# Patient Record
Sex: Female | Born: 1950 | Race: White | Hispanic: No | Smoking: Former smoker
Health system: Southern US, Community
[De-identification: ages and names within clinical notes are randomized; demographics above are authoritative.]

## PROBLEM LIST (undated history)

## (undated) ENCOUNTER — Emergency Department (HOSPITAL_COMMUNITY): Discharge status: Absent without leave | Payer: Medicare Other

## (undated) DIAGNOSIS — M1990 Unspecified osteoarthritis, unspecified site: Secondary | ICD-10-CM

## (undated) DIAGNOSIS — I2510 Atherosclerotic heart disease of native coronary artery without angina pectoris: Secondary | ICD-10-CM

## (undated) DIAGNOSIS — J439 Emphysema, unspecified: Secondary | ICD-10-CM

## (undated) DIAGNOSIS — I4819 Other persistent atrial fibrillation: Secondary | ICD-10-CM

## (undated) DIAGNOSIS — J8489 Other specified interstitial pulmonary diseases: Secondary | ICD-10-CM

## (undated) DIAGNOSIS — E7800 Pure hypercholesterolemia, unspecified: Secondary | ICD-10-CM

## (undated) DIAGNOSIS — I5032 Chronic diastolic (congestive) heart failure: Secondary | ICD-10-CM

## (undated) DIAGNOSIS — J9610 Chronic respiratory failure, unspecified whether with hypoxia or hypercapnia: Secondary | ICD-10-CM

## (undated) DIAGNOSIS — I77810 Thoracic aortic ectasia: Secondary | ICD-10-CM

## (undated) DIAGNOSIS — I10 Essential (primary) hypertension: Secondary | ICD-10-CM

## (undated) HISTORY — DX: Other specified interstitial pulmonary diseases: J84.89

## (undated) HISTORY — DX: Chronic respiratory failure, unspecified whether with hypoxia or hypercapnia: J96.10

## (undated) HISTORY — PX: LEG SURGERY: SHX1003

## (undated) HISTORY — DX: Atherosclerotic heart disease of native coronary artery without angina pectoris: I25.10

## (undated) HISTORY — PX: CATARACT EXTRACTION: SUR2

## (undated) HISTORY — DX: Emphysema, unspecified: J43.9

## (undated) HISTORY — DX: Thoracic aortic ectasia: I77.810

## (undated) HISTORY — DX: Other persistent atrial fibrillation: I48.19

## (undated) HISTORY — DX: Chronic diastolic (congestive) heart failure: I50.32

---

## 2008-01-27 ENCOUNTER — Encounter: Payer: Self-pay | Admitting: Endocrinology

## 2008-02-10 ENCOUNTER — Encounter: Payer: Self-pay | Admitting: Endocrinology

## 2008-02-24 DIAGNOSIS — M899 Disorder of bone, unspecified: Secondary | ICD-10-CM | POA: Insufficient documentation

## 2008-02-24 DIAGNOSIS — M549 Dorsalgia, unspecified: Secondary | ICD-10-CM | POA: Insufficient documentation

## 2008-02-24 DIAGNOSIS — M949 Disorder of cartilage, unspecified: Secondary | ICD-10-CM

## 2008-02-24 DIAGNOSIS — I10 Essential (primary) hypertension: Secondary | ICD-10-CM | POA: Insufficient documentation

## 2008-02-24 DIAGNOSIS — M4120 Other idiopathic scoliosis, site unspecified: Secondary | ICD-10-CM | POA: Insufficient documentation

## 2008-02-25 ENCOUNTER — Ambulatory Visit: Payer: Self-pay | Admitting: Endocrinology

## 2008-02-25 DIAGNOSIS — F329 Major depressive disorder, single episode, unspecified: Secondary | ICD-10-CM | POA: Insufficient documentation

## 2008-02-25 DIAGNOSIS — E0590 Thyrotoxicosis, unspecified without thyrotoxic crisis or storm: Secondary | ICD-10-CM | POA: Insufficient documentation

## 2008-02-25 LAB — CONVERTED CEMR LAB: TSH: 0.25 microintl units/mL — ABNORMAL LOW (ref 0.35–5.50)

## 2008-03-07 ENCOUNTER — Encounter: Admission: RE | Admit: 2008-03-07 | Discharge: 2008-03-07 | Payer: Self-pay | Admitting: Endocrinology

## 2010-11-23 ENCOUNTER — Emergency Department (HOSPITAL_COMMUNITY)
Admission: EM | Admit: 2010-11-23 | Discharge: 2010-11-24 | Disposition: A | Discharge status: Home / Self Care | Payer: Medicare Other | Attending: Emergency Medicine | Admitting: Emergency Medicine

## 2010-11-23 DIAGNOSIS — N289 Disorder of kidney and ureter, unspecified: Secondary | ICD-10-CM | POA: Insufficient documentation

## 2010-11-23 DIAGNOSIS — I10 Essential (primary) hypertension: Secondary | ICD-10-CM | POA: Insufficient documentation

## 2010-11-23 DIAGNOSIS — M7989 Other specified soft tissue disorders: Secondary | ICD-10-CM | POA: Insufficient documentation

## 2010-11-23 DIAGNOSIS — R609 Edema, unspecified: Secondary | ICD-10-CM | POA: Insufficient documentation

## 2010-11-23 DIAGNOSIS — R0609 Other forms of dyspnea: Secondary | ICD-10-CM | POA: Insufficient documentation

## 2010-11-23 DIAGNOSIS — R0989 Other specified symptoms and signs involving the circulatory and respiratory systems: Secondary | ICD-10-CM | POA: Insufficient documentation

## 2010-11-23 DIAGNOSIS — R079 Chest pain, unspecified: Secondary | ICD-10-CM | POA: Insufficient documentation

## 2010-11-24 ENCOUNTER — Emergency Department (HOSPITAL_COMMUNITY): Payer: Medicare Other

## 2010-11-24 LAB — BASIC METABOLIC PANEL
BUN: 29 mg/dL — ABNORMAL HIGH (ref 6–23)
CO2: 29 mEq/L (ref 19–32)
Chloride: 106 mEq/L (ref 96–112)
Creatinine, Ser: 1.19 mg/dL (ref 0.4–1.2)
Potassium: 4.3 mEq/L (ref 3.5–5.1)

## 2010-11-24 LAB — CBC
HCT: 35.6 % — ABNORMAL LOW (ref 36.0–46.0)
Hemoglobin: 11.2 g/dL — ABNORMAL LOW (ref 12.0–15.0)
MCH: 27.2 pg (ref 26.0–34.0)
MCV: 86.4 fL (ref 78.0–100.0)
RBC: 4.12 MIL/uL (ref 3.87–5.11)
WBC: 10.2 10*3/uL (ref 4.0–10.5)

## 2010-11-24 LAB — DIFFERENTIAL
Eosinophils Absolute: 0.5 10*3/uL (ref 0.0–0.7)
Lymphocytes Relative: 23 % (ref 12–46)
Lymphs Abs: 2.3 10*3/uL (ref 0.7–4.0)
Monocytes Relative: 12 % (ref 3–12)
Neutro Abs: 6.1 10*3/uL (ref 1.7–7.7)
Neutrophils Relative %: 60 % (ref 43–77)

## 2013-11-08 ENCOUNTER — Other Ambulatory Visit (HOSPITAL_COMMUNITY): Payer: Self-pay | Admitting: Internal Medicine

## 2013-11-08 DIAGNOSIS — E0590 Thyrotoxicosis, unspecified without thyrotoxic crisis or storm: Secondary | ICD-10-CM

## 2013-11-16 ENCOUNTER — Encounter (HOSPITAL_COMMUNITY)
Admission: RE | Admit: 2013-11-16 | Discharge: 2013-11-16 | Disposition: A | Discharge status: Home / Self Care | Payer: Medicare Other | Source: Ambulatory Visit | Attending: Internal Medicine | Admitting: Internal Medicine

## 2013-11-16 DIAGNOSIS — E0590 Thyrotoxicosis, unspecified without thyrotoxic crisis or storm: Secondary | ICD-10-CM

## 2013-11-16 DIAGNOSIS — E0520 Thyrotoxicosis with toxic multinodular goiter without thyrotoxic crisis or storm: Secondary | ICD-10-CM | POA: Insufficient documentation

## 2013-11-17 ENCOUNTER — Encounter (HOSPITAL_COMMUNITY)
Admission: RE | Admit: 2013-11-17 | Discharge: 2013-11-17 | Disposition: A | Discharge status: Home / Self Care | Payer: Medicare Other | Source: Ambulatory Visit | Attending: Internal Medicine | Admitting: Internal Medicine

## 2013-11-17 MED ORDER — SODIUM PERTECHNETATE TC 99M INJECTION
10.0000 | Freq: Once | INTRAVENOUS | Status: AC | PRN
  Administered 2013-11-17: 10 via INTRAVENOUS

## 2013-11-17 MED ORDER — SODIUM IODIDE I 131 CAPSULE
9.0000 | Freq: Once | INTRAVENOUS | Status: AC | PRN
  Administered 2013-11-16: 9 via ORAL

## 2013-11-19 ENCOUNTER — Encounter (HOSPITAL_COMMUNITY): Payer: Self-pay

## 2013-11-19 ENCOUNTER — Encounter (HOSPITAL_COMMUNITY)
Admission: RE | Admit: 2013-11-19 | Discharge: 2013-11-19 | Disposition: A | Discharge status: Home / Self Care | Payer: Medicare Other | Source: Ambulatory Visit | Attending: Internal Medicine | Admitting: Internal Medicine

## 2013-11-19 DIAGNOSIS — E0590 Thyrotoxicosis, unspecified without thyrotoxic crisis or storm: Secondary | ICD-10-CM

## 2013-11-19 MED ORDER — SODIUM IODIDE I 131 CAPSULE
32.4000 | Freq: Once | INTRAVENOUS | Status: AC | PRN
  Administered 2013-11-19: 32.4 via ORAL

## 2016-02-05 ENCOUNTER — Emergency Department (HOSPITAL_COMMUNITY): Payer: Medicare Other

## 2016-02-05 ENCOUNTER — Encounter (HOSPITAL_COMMUNITY): Payer: Self-pay | Admitting: *Deleted

## 2016-02-05 ENCOUNTER — Observation Stay (HOSPITAL_COMMUNITY)
Admission: EM | Admit: 2016-02-05 | Discharge: 2016-02-08 | Disposition: A | Discharge status: Home / Self Care | Payer: Medicare Other | Attending: Internal Medicine | Admitting: Internal Medicine

## 2016-02-05 DIAGNOSIS — I10 Essential (primary) hypertension: Secondary | ICD-10-CM | POA: Diagnosis present

## 2016-02-05 DIAGNOSIS — Z87891 Personal history of nicotine dependence: Secondary | ICD-10-CM | POA: Diagnosis not present

## 2016-02-05 DIAGNOSIS — R0600 Dyspnea, unspecified: Secondary | ICD-10-CM | POA: Diagnosis present

## 2016-02-05 DIAGNOSIS — Z79891 Long term (current) use of opiate analgesic: Secondary | ICD-10-CM | POA: Insufficient documentation

## 2016-02-05 DIAGNOSIS — J441 Chronic obstructive pulmonary disease with (acute) exacerbation: Secondary | ICD-10-CM | POA: Insufficient documentation

## 2016-02-05 DIAGNOSIS — R739 Hyperglycemia, unspecified: Secondary | ICD-10-CM | POA: Diagnosis not present

## 2016-02-05 DIAGNOSIS — I159 Secondary hypertension, unspecified: Secondary | ICD-10-CM

## 2016-02-05 DIAGNOSIS — I5033 Acute on chronic diastolic (congestive) heart failure: Secondary | ICD-10-CM | POA: Insufficient documentation

## 2016-02-05 DIAGNOSIS — J9601 Acute respiratory failure with hypoxia: Secondary | ICD-10-CM | POA: Diagnosis not present

## 2016-02-05 DIAGNOSIS — M1990 Unspecified osteoarthritis, unspecified site: Secondary | ICD-10-CM | POA: Diagnosis not present

## 2016-02-05 DIAGNOSIS — Z79899 Other long term (current) drug therapy: Secondary | ICD-10-CM | POA: Diagnosis not present

## 2016-02-05 DIAGNOSIS — I110 Hypertensive heart disease with heart failure: Secondary | ICD-10-CM | POA: Diagnosis not present

## 2016-02-05 DIAGNOSIS — H409 Unspecified glaucoma: Secondary | ICD-10-CM | POA: Diagnosis not present

## 2016-02-05 DIAGNOSIS — R609 Edema, unspecified: Secondary | ICD-10-CM

## 2016-02-05 DIAGNOSIS — J189 Pneumonia, unspecified organism: Principal | ICD-10-CM | POA: Diagnosis present

## 2016-02-05 HISTORY — DX: Unspecified osteoarthritis, unspecified site: M19.90

## 2016-02-05 HISTORY — DX: Essential (primary) hypertension: I10

## 2016-02-05 HISTORY — DX: Pure hypercholesterolemia, unspecified: E78.00

## 2016-02-05 LAB — CBC WITH DIFFERENTIAL/PLATELET
Basophils Absolute: 0.1 10*3/uL (ref 0.0–0.1)
Basophils Relative: 1 %
Eosinophils Absolute: 0.3 10*3/uL (ref 0.0–0.7)
Eosinophils Relative: 3 %
HCT: 42.9 % (ref 36.0–46.0)
Hemoglobin: 14.3 g/dL (ref 12.0–15.0)
Lymphocytes Relative: 17 %
Lymphs Abs: 1.7 10*3/uL (ref 0.7–4.0)
MCH: 28.3 pg (ref 26.0–34.0)
MCHC: 33.3 g/dL (ref 30.0–36.0)
MCV: 85 fL (ref 78.0–100.0)
Monocytes Absolute: 1.4 10*3/uL — ABNORMAL HIGH (ref 0.1–1.0)
Monocytes Relative: 14 %
Neutro Abs: 6.6 10*3/uL (ref 1.7–7.7)
Neutrophils Relative %: 66 %
Platelets: 147 10*3/uL — ABNORMAL LOW (ref 150–400)
RBC: 5.05 MIL/uL (ref 3.87–5.11)
RDW: 17.4 % — ABNORMAL HIGH (ref 11.5–15.5)
WBC: 10.1 10*3/uL (ref 4.0–10.5)

## 2016-02-05 LAB — BASIC METABOLIC PANEL
Anion gap: 7 (ref 5–15)
BUN: 17 mg/dL (ref 6–20)
CO2: 25 mmol/L (ref 22–32)
Calcium: 9 mg/dL (ref 8.9–10.3)
Chloride: 105 mmol/L (ref 101–111)
Creatinine, Ser: 0.85 mg/dL (ref 0.44–1.00)
GFR calc Af Amer: >60 mL/min (ref >60)
GFR calc non Af Amer: >60 mL/min (ref >60)
Glucose, Bld: 93 mg/dL (ref 65–99)
Potassium: 3.9 mmol/L (ref 3.5–5.1)
Sodium: 137 mmol/L (ref 135–145)

## 2016-02-05 LAB — I-STAT CG4 LACTIC ACID, ED: Lactic Acid, Venous: 1.05 mmol/L (ref 0.5–2.0)

## 2016-02-05 MED ORDER — IPRATROPIUM-ALBUTEROL 0.5-2.5 (3) MG/3ML IN SOLN
3.0000 mL | Freq: Once | RESPIRATORY_TRACT | Status: AC
  Administered 2016-02-05: 3 mL via RESPIRATORY_TRACT
  Filled 2016-02-05: qty 3

## 2016-02-05 MED ORDER — METHYLPREDNISOLONE SODIUM SUCC 125 MG IJ SOLR
125.0000 mg | Freq: Once | INTRAMUSCULAR | Status: AC
  Administered 2016-02-05: 125 mg via INTRAVENOUS
  Filled 2016-02-05: qty 2

## 2016-02-05 MED ORDER — ALBUTEROL (5 MG/ML) CONTINUOUS INHALATION SOLN
10.0000 mg/h | INHALATION_SOLUTION | Freq: Once | RESPIRATORY_TRACT | Status: AC
  Administered 2016-02-05: 10 mg/h via RESPIRATORY_TRACT
  Filled 2016-02-05 (×2): qty 20

## 2016-02-05 MED ORDER — DEXTROSE 5 % IV SOLN
1.0000 g | Freq: Once | INTRAVENOUS | Status: AC
  Administered 2016-02-05: 1 g via INTRAVENOUS
  Filled 2016-02-05: qty 10

## 2016-02-05 MED ORDER — CEFTRIAXONE SODIUM 1 G IJ SOLR
1.0000 g | INTRAMUSCULAR | Status: DC
  Administered 2016-02-06 – 2016-02-07 (×2): 1 g via INTRAVENOUS
  Filled 2016-02-05 (×2): qty 10

## 2016-02-05 MED ORDER — AZITHROMYCIN 500 MG IV SOLR: 500.0000 mg | INTRAVENOUS | Status: DC

## 2016-02-05 MED ORDER — ALBUTEROL SULFATE (2.5 MG/3ML) 0.083% IN NEBU
5.0000 mg | INHALATION_SOLUTION | Freq: Once | RESPIRATORY_TRACT | Status: AC
  Administered 2016-02-05: 5 mg via RESPIRATORY_TRACT
  Filled 2016-02-05: qty 6

## 2016-02-05 MED ORDER — AZITHROMYCIN 500 MG IV SOLR
500.0000 mg | Freq: Once | INTRAVENOUS | Status: AC
  Administered 2016-02-05: 500 mg via INTRAVENOUS
  Filled 2016-02-05: qty 500

## 2016-02-05 NOTE — Progress Notes (Signed)
Pharmacy Antibiotic Note  [name] is a 65 y.o. female admitted on 02/05/2016 with pneumonia.  Pharmacy has been consulted for  Azithromycin and Ceftriaxone dosing.  Plan: Ceftriaxone 1g IV q24h Azithromycin 500mg  IV q24h  Height: 5' (152.4 cm) Weight: 207 lb (93.895 kg) IBW/kg (Calculated) : 45.5  Temp (24hrs), Avg:98.6 F (37 C), Min:98.6 F (37 C), Max:98.6 F (37 C)   Recent Labs Lab 02/05/16 1758  WBC 10.1  CREATININE 0.85    Estimated Creatinine Clearance: 67.6 mL/min (by C-G formula based on Cr of 0.85).    Allergies  Allergen Reactions  . Percocet [Oxycodone-Acetaminophen] Itching    Antimicrobials this admission: 6/12 Azithromycin >>  6/12 Ceftriaxone >>   Dose adjustments this admission:  Microbiology results: 6/12 BCx: collected  Dosage remains stable and need for further dosage adjustment appears unlikely at present.    Will sign off at this time.  Please reconsult if a change in clinical status warrants re-evaluation of dosage.  Thank you for allowing pharmacy to be a part of this patient's care.  Haynes Hoehn, PharmD, BCPS 02/05/2016, 9:12 PM  Pager: [phone]

## 2016-02-05 NOTE — ED Notes (Signed)
Pt reports going to her PCP today for routine office visit.  Was found to have hypoxia.  Pt reports bila low back pain, SOB and cough.  Reports she started to feel bad x 2 days.  Coughing up greenish sputum.  Denies any lung problems at this time.

## 2016-02-05 NOTE — ED Notes (Signed)
EKG ordered at 2052 has been discontinued.  Clicked completed by error.

## 2016-02-05 NOTE — ED Notes (Signed)
Coming from [facility] Physician for COPD exacerbation. Hypoxic at 80-85% coming POV

## 2016-02-05 NOTE — H&P (Signed)
- History and Physical    [name] ZOX:[id number] DOB: 07/27/1951 DOA: 02/05/2016  PCP: Romero Belling, MD  Patient coming from: home  Chief Complaint: "I just wasn't getting better."  HPI: [name] is a 65 y.o. female with medical history significant for hypertension, cholesterol high and glaucoma presents to the emergency room with chief complaint of respiratory distress. Patient states that she has been short of breath for the last month. She went to an urgent care with this complaint and was given antibiotics. She states the antibiotics did not help her in the long-term. She was briefly better and then got worse again. Patient went to go get seen today for further evaluation and was found to be hypoxic into the 80s. She was sent over to the emergency room.  ED Course: Patient started on Rocephin and azithromycin in the emergency room.  Review of Systems: As per HPI otherwise 10 point review of systems negative.    Past Medical History  Diagnosis Date  . Osteoarthritis   . Hypertension   . Hypercholesterolemia     Past Surgical History  Procedure Laterality Date  . Leg surgery Right      reports that she has quit smoking. She does not have any smokeless tobacco history on file. She reports that she does not drink alcohol or use illicit drugs.  Allergies  Allergen Reactions  . Percocet [Oxycodone-Acetaminophen] Itching    Family History  Problem Relation Age of Onset  . Stroke Neg Hx      Prior to Admission medications   Medication Sig Start Date End Date Taking? Authorizing Provider  amLODipine (NORVASC) 10 MG tablet TK 1 T PO QD 12/12/15  Yes Historical Provider, MD  atenolol (TENORMIN) 25 MG tablet TK 1 T PO QD 01/05/16  Yes Historical Provider, MD  benazepril (LOTENSIN) 20 MG tablet TK 1 T PO QD 12/12/15  Yes Historical Provider, MD  HYDROcodone-acetaminophen (NORCO/VICODIN) 5-325 MG tablet TK 1 T PO BID PRN FOR PAIN 11/30/15  Yes Historical Provider, MD   latanoprost (XALATAN) 0.005 % ophthalmic solution Place 1 drop into both eyes at bedtime.   Yes Historical Provider, MD  LORazepam (ATIVAN) 1 MG tablet TAKE 1 TABLET BY MOUTH TID PRN ANXIETY 01/05/16  Yes Historical Provider, MD  timolol (TIMOPTIC) 0.5 % ophthalmic solution INSTILL 1 DROP IN BOTH EYES BID 11/28/15  Yes Historical Provider, MD  PROAIR HFA 108 (90 Base) MCG/ACT inhaler INHALE 2 PUFFS INTO THE LUNGS Q 4 HOURS PRN 01/05/16   Historical Provider, MD    Physical Exam:   Constitutional: NAD, calm, comfortable Filed Vitals:   02/05/16 1723 02/05/16 2030  BP: 137/92 115/52  Pulse: 90 95  Temp: 98.6 F (37 C)   TempSrc: Oral   Resp: 22 27  Height: 5' (1.524 m)   Weight: 93.895 kg (207 lb)   SpO2: 85% 90%   Eyes: PERRL, lids and conjunctivae normal ENMT: Mucous membranes are moist. Posterior pharynx clear of any exudate or lesions.Normal dentition.  Neck: normal, supple, no masses, no thyromegaly Respiratory: Right and left-sided crackles. Mild increased work of breathing.  Cardiovascular: Regular rate and rhythm, no murmurs / rubs / gallops. No extremity edema. 2+ pedal pulses. No carotid bruits.  Abdomen: no tenderness, no masses palpated. No hepatosplenomegaly. Bowel sounds positive.  Musculoskeletal: no clubbing / cyanosis. No joint deformity upper and lower extremities. Good ROM, no contractures. Normal muscle tone.  Skin: no rashes, lesions, ulcers. No induration Neurologic: CN 2-12 grossly  intact. Sensation intact, DTR normal. Strength 5/5 in all 4.  Psychiatric: Normal judgment and insight. Alert and oriented x 3. Normal mood.   Labs on Admission: I have personally reviewed following labs and imaging studies  CBC:  Recent Labs Lab 02/05/16 1758  WBC 10.1  NEUTROABS 6.6  HGB 14.3  HCT 42.9  MCV 85.0  PLT 147*   Basic Metabolic Panel:  Recent Labs Lab 02/05/16 1758  NA 137  K 3.9  CL 105  CO2 25  GLUCOSE 93  BUN 17  CREATININE 0.85  CALCIUM 9.0    GFR: Estimated Creatinine Clearance: 67.6 mL/min (by C-G formula based on Cr of 0.85). Liver Function Tests: No results for input(s): AST, ALT, ALKPHOS, BILITOT, PROT, ALBUMIN in the last 168 hours. No results for input(s): LIPASE, AMYLASE in the last 168 hours. No results for input(s): AMMONIA in the last 168 hours. Coagulation Profile: No results for input(s): INR, PROTIME in the last 168 hours. Cardiac Enzymes: No results for input(s): CKTOTAL, CKMB, CKMBINDEX, TROPONINI in the last 168 hours. BNP (last 3 results) No results for input(s): PROBNP in the last 8760 hours. HbA1C: No results for input(s): HGBA1C in the last 72 hours. CBG: No results for input(s): GLUCAP in the last 168 hours. Lipid Profile: No results for input(s): CHOL, HDL, LDLCALC, TRIG, CHOLHDL, LDLDIRECT in the last 72 hours. Thyroid Function Tests: No results for input(s): TSH, T4TOTAL, FREET4, T3FREE, THYROIDAB in the last 72 hours. Anemia Panel: No results for input(s): VITAMINB12, FOLATE, FERRITIN, TIBC, IRON, RETICCTPCT in the last 72 hours. Urine analysis: No results found for: COLORURINE, APPEARANCEUR, LABSPEC, PHURINE, GLUCOSEU, HGBUR, BILIRUBINUR, KETONESUR, PROTEINUR, UROBILINOGEN, NITRITE, LEUKOCYTESUR Sepsis Labs: !!!!!!!!!!!!!!!!!!!!!!!!!!!!!!!!!!!!!!!!!!!! @LABRCNTIP (procalcitonin:4,lacticidven:4) )No results found for this or any previous visit (from the past 240 hour(s)).   Radiological Exams on Admission: Dg Chest 2 View  02/05/2016  CLINICAL DATA:  Chronic productive cough and chest soreness. Initial encounter. EXAM: CHEST  2 VIEW COMPARISON:  Chest radiograph performed 11/24/2010 FINDINGS: The lungs are well-aerated. Vascular congestion is noted. Patchy right midlung and left basilar airspace opacities raise concern for pneumonia. No pleural effusion or pneumothorax is seen. The heart is normal in size; the mediastinal contour is within normal limits. No acute osseous abnormalities are seen.  IMPRESSION: Vascular congestion noted. Patchy right midlung and left basilar airspace opacities raise concern for pneumonia. Followup PA and lateral chest X-ray is recommended in 3-4 weeks following trial of antibiotic therapy to ensure resolution and exclude underlying malignancy. Electronically Signed   By: Roanna Raider M.D.   On: 02/05/2016 18:45    EKG: Independently reviewed. Normal sinus rhythm, no stemi  Assessment/Plan Principal Problem:   Acute respiratory failure with hypoxia (HCC) Active Problems:   HTN (hypertension)  Acute respiratory failure with hypoxia due to pneumonia Started Rocephin will continue  Continue azithromycin DuoNeb's schedule When necessary albuterol every 2 Day 2 of antibiotics Blood cultures 2 pending Incentive spirometry Continuous pulse ox  Hypertension Continue Lotensin 20 mg daily Continue Norvasc 10 mg daily Continue Tenormin 25 mg daily When necessary hydralazine for high blood pressure  Glaucoma  Xalatan 1 drop both eyes daily at bedtime Timoptic one drop both eyes twice a day  DVT prophylaxis: Lovenox Code Status: Full code Family Communication: None Disposition Plan: Home Consults called: None Admission status: obs   Haydee Salter MD [facility] Pager [phone]  If 7PM-7AM, please contact night-coverage [email or web address] Password [facility]  02/05/2016, 10:39 PM

## 2016-02-05 NOTE — ED Provider Notes (Signed)
CSN: [id number]     Arrival date & time 02/05/16  1718 History   First MD Initiated Contact with Patient 02/05/16 1723     Chief Complaint  Patient presents with  . Respiratory Distress  . Cough   HPI   65 -year-old female presents today with respiratory distress. Patient was seen at her primary care provider's office today after 1 month long history of upper respiratory infection. She reports that symptoms have progressively worsened to the point today where she was having difficulty breathing. She notes she was giving a breathing treatment at her primary care provider's office and sent for further evaluation. Patient was hypoxic at their office. Patient notes that symptoms started with upper respiratory congestion, dry nonproductive cough. Symptoms continue to persist with worsening cough and shortness of breath. Patient denies any history of asthma COPD, she does report that she is a previous smoker, quitting 5 years ago. Patient reports low-grade fever at home.    Past Medical History  Diagnosis Date  . Osteoarthritis   . Hypertension   . Hypercholesterolemia    Past Surgical History  Procedure Laterality Date  . Leg surgery Right   . Cataract extraction Bilateral    Family History  Problem Relation Age of Onset  . Stroke Neg Hx    Social History  Substance Use Topics  . Smoking status: Former Games developer  . Smokeless tobacco: None  . Alcohol Use: No   OB History    No data available     Review of Systems  All other systems reviewed and are negative.   Allergies  Percocet  Home Medications   Prior to Admission medications   Medication Sig Start Date End Date Taking? Authorizing Provider  amLODipine (NORVASC) 10 MG tablet TK 1 T PO QD 12/12/15  Yes Historical Provider, MD  atenolol (TENORMIN) 25 MG tablet TK 1 T PO QD 01/05/16  Yes Historical Provider, MD  benazepril (LOTENSIN) 20 MG tablet TK 1 T PO QD 12/12/15  Yes Historical Provider, MD  HYDROcodone-acetaminophen  (NORCO/VICODIN) 5-325 MG tablet TK 1 T PO BID PRN FOR PAIN 11/30/15  Yes Historical Provider, MD  latanoprost (XALATAN) 0.005 % ophthalmic solution Place 1 drop into both eyes at bedtime.   Yes Historical Provider, MD  LORazepam (ATIVAN) 1 MG tablet TAKE 1 TABLET BY MOUTH TID PRN ANXIETY 01/05/16  Yes Historical Provider, MD  timolol (TIMOPTIC) 0.5 % ophthalmic solution INSTILL 1 DROP IN BOTH EYES BID 11/28/15  Yes Historical Provider, MD  PROAIR HFA 108 (90 Base) MCG/ACT inhaler INHALE 2 PUFFS INTO THE LUNGS Q 4 HOURS PRN 01/05/16   Historical Provider, MD   BP 130/81 mmHg  Pulse 90  Temp(Src) 97.7 F (36.5 C) (Oral)  Resp 22  Ht 5' (1.524 m)  Wt 93.895 kg  BMI 40.43 kg/m2  SpO2 96%   Physical Exam  Constitutional: She is oriented to person, place, and time. She appears well-developed and well-nourished.  HENT:  Head: Normocephalic and atraumatic.  Eyes: Conjunctivae are normal. Pupils are equal, round, and reactive to light. Right eye exhibits no discharge. Left eye exhibits no discharge. No scleral icterus.  Neck: Normal range of motion. No JVD present. No tracheal deviation present.  Cardiovascular: Regular rhythm and normal heart sounds.   Pulmonary/Chest: No stridor. She is in respiratory distress. She has wheezes. She has rales. She exhibits no tenderness.  Musculoskeletal: She exhibits no edema.  Neurological: She is alert and oriented to person, place, and time. Coordination normal.  Skin: Skin is warm and dry. No rash noted. No erythema. No pallor.  Psychiatric: She has a normal mood and affect. Her behavior is normal. Judgment and thought content normal.  Nursing note and vitals reviewed.   ED Course  Procedures (including critical care time) Labs Review Labs Reviewed  CBC WITH DIFFERENTIAL/PLATELET - Abnormal; Notable for the following:    RDW 17.4 (*)    Platelets 147 (*)    Monocytes Absolute 1.4 (*)    All other components within normal limits  I-STAT CG4 LACTIC ACID,  ED - Abnormal; Notable for the following:    Lactic Acid, Venous 2.36 (*)    All other components within normal limits  CULTURE, BLOOD (ROUTINE X 2)  CULTURE, BLOOD (ROUTINE X 2)  BASIC METABOLIC PANEL  CBC  CREATININE, SERUM  BASIC METABOLIC PANEL  CBC  I-STAT CG4 LACTIC ACID, ED    Imaging Review Dg Chest 2 View  02/05/2016  CLINICAL DATA:  Chronic productive cough and chest soreness. Initial encounter. EXAM: CHEST  2 VIEW COMPARISON:  Chest radiograph performed 11/24/2010 FINDINGS: The lungs are well-aerated. Vascular congestion is noted. Patchy right midlung and left basilar airspace opacities raise concern for pneumonia. No pleural effusion or pneumothorax is seen. The heart is normal in size; the mediastinal contour is within normal limits. No acute osseous abnormalities are seen. IMPRESSION: Vascular congestion noted. Patchy right midlung and left basilar airspace opacities raise concern for pneumonia. Followup PA and lateral chest X-ray is recommended in 3-4 weeks following trial of antibiotic therapy to ensure resolution and exclude underlying malignancy. Electronically Signed   By: Roanna Raider M.D.   On: 02/05/2016 18:45   I have personally reviewed and evaluated these images and lab results as part of my medical decision-making.   EKG Interpretation None      MDM   Final diagnoses:  Community acquired pneumonia    Labs: CBC, BMP  Imaging: DG chest 2 view  Consults:  Therapeutics: Albuterol, DuoNeb, Solu-Medrol, azithromycin, Rocephin  Discharge Meds:    Assessment/Plan: 65 year old female presents today with complaints of upper respiratory infection and shortness of breath. Patient was initially hypoxic on room air. Wheezing and rales noted on exam, she was slightly to. Breathing treatment started, Solu-Medrol given, labs ordered.  Wheezing improved after first breathing treatment, continued wheezing, hypoxic on room air. Chest x-ray shows likely pneumonia.  Repeat pain treatment.  Patient continues to have low oxygen saturation, continued wheezing. She is afebrile, with no significant elevation in her wbc's. Due to patient's hypoxic status in the setting of pneumonia she will be admitted to the hospital for continued management.  No history of intubations, respiratory failure, or any known respiratory disease although she was a smoker.        Eyvonne Mechanic, PA-C 02/06/16 0132  Nelva Nay, MD 02/07/16 [phone]

## 2016-02-06 ENCOUNTER — Observation Stay (HOSPITAL_BASED_OUTPATIENT_CLINIC_OR_DEPARTMENT_OTHER): Payer: Medicare Other

## 2016-02-06 DIAGNOSIS — J9601 Acute respiratory failure with hypoxia: Secondary | ICD-10-CM | POA: Diagnosis not present

## 2016-02-06 DIAGNOSIS — R609 Edema, unspecified: Secondary | ICD-10-CM | POA: Diagnosis not present

## 2016-02-06 DIAGNOSIS — J189 Pneumonia, unspecified organism: Secondary | ICD-10-CM | POA: Diagnosis present

## 2016-02-06 DIAGNOSIS — I159 Secondary hypertension, unspecified: Secondary | ICD-10-CM | POA: Diagnosis not present

## 2016-02-06 DIAGNOSIS — I10 Essential (primary) hypertension: Secondary | ICD-10-CM

## 2016-02-06 DIAGNOSIS — J441 Chronic obstructive pulmonary disease with (acute) exacerbation: Secondary | ICD-10-CM

## 2016-02-06 DIAGNOSIS — H409 Unspecified glaucoma: Secondary | ICD-10-CM | POA: Diagnosis present

## 2016-02-06 LAB — ECHOCARDIOGRAM COMPLETE
EERAT: 11.48
EWDT: 176 ms
FS: 31 % (ref 28–44)
Height: 60 in
IVS/LV PW RATIO, ED: 1.04
LA ID, A-P, ES: 34 mm
LA diam end sys: 34 mm
LA diam index: 1.67 cm/m2
LA vol A4C: 36.1 ml
LA vol index: 18.7 mL/m2
LA vol: 38 mL
LV E/e' medial: 11.48
LV TDI E'LATERAL: 7.72
LV TDI E'MEDIAL: 8.16
LV e' LATERAL: 7.72 cm/s
LVEEAVG: 11.48
LVOT area: 4.15 cm2
LVOTD: 23 mm
MV Dec: 176
MV pk A vel: 129 m/s
MV pk E vel: 88.6 m/s
MVPG: 3 mmHg
PW: 10.7 mm — AB (ref 0.6–1.1)
TAPSE: 23.5 mm
Weight: 3273.39 oz

## 2016-02-06 LAB — BASIC METABOLIC PANEL
Anion gap: 6 (ref 5–15)
BUN: 21 mg/dL — AB (ref 6–20)
CHLORIDE: 106 mmol/L (ref 101–111)
CO2: 26 mmol/L (ref 22–32)
Calcium: 8.8 mg/dL — ABNORMAL LOW (ref 8.9–10.3)
Creatinine, Ser: 1 mg/dL (ref 0.44–1.00)
GFR calc Af Amer: >60 mL/min (ref >60)
GFR calc non Af Amer: 58 mL/min — ABNORMAL LOW (ref >60)
GLUCOSE: 224 mg/dL — AB (ref 65–99)
POTASSIUM: 4.3 mmol/L (ref 3.5–5.1)
Sodium: 138 mmol/L (ref 135–145)

## 2016-02-06 LAB — CBC
HEMATOCRIT: 40.6 % (ref 36.0–46.0)
Hemoglobin: 13.3 g/dL (ref 12.0–15.0)
MCH: 27.9 pg (ref 26.0–34.0)
MCHC: 32.8 g/dL (ref 30.0–36.0)
MCV: 85.3 fL (ref 78.0–100.0)
Platelets: 151 10*3/uL (ref 150–400)
RBC: 4.76 MIL/uL (ref 3.87–5.11)
RDW: 17.3 % — AB (ref 11.5–15.5)
WBC: 9 10*3/uL (ref 4.0–10.5)

## 2016-02-06 LAB — I-STAT CG4 LACTIC ACID, ED: Lactic Acid, Venous: 2.36 mmol/L (ref 0.5–2.0)

## 2016-02-06 LAB — GLUCOSE, CAPILLARY
GLUCOSE-CAPILLARY: 176 mg/dL — AB (ref 65–99)
GLUCOSE-CAPILLARY: 188 mg/dL — AB (ref 65–99)

## 2016-02-06 MED ORDER — LATANOPROST 0.005 % OP SOLN
1.0000 [drp] | Freq: Every day | OPHTHALMIC | Status: DC
  Administered 2016-02-06 – 2016-02-07 (×3): 1 [drp] via OPHTHALMIC
  Filled 2016-02-06: qty 2.5

## 2016-02-06 MED ORDER — INSULIN ASPART 100 UNIT/ML ~~LOC~~ SOLN
0.0000 [IU] | Freq: Three times a day (TID) | SUBCUTANEOUS | Status: DC
  Administered 2016-02-06 – 2016-02-07 (×4): 2 [IU] via SUBCUTANEOUS

## 2016-02-06 MED ORDER — AMLODIPINE BESYLATE 10 MG PO TABS
10.0000 mg | ORAL_TABLET | Freq: Every day | ORAL | Status: DC
  Administered 2016-02-06 – 2016-02-08 (×3): 10 mg via ORAL
  Filled 2016-02-06 (×3): qty 1

## 2016-02-06 MED ORDER — METHYLPREDNISOLONE SODIUM SUCC 125 MG IJ SOLR
60.0000 mg | Freq: Three times a day (TID) | INTRAMUSCULAR | Status: DC
  Administered 2016-02-06 – 2016-02-07 (×3): 60 mg via INTRAVENOUS
  Filled 2016-02-06 (×3): qty 2

## 2016-02-06 MED ORDER — TIMOLOL MALEATE 0.5 % OP SOLN
1.0000 [drp] | Freq: Two times a day (BID) | OPHTHALMIC | Status: DC
  Administered 2016-02-06 – 2016-02-08 (×6): 1 [drp] via OPHTHALMIC
  Filled 2016-02-06: qty 5

## 2016-02-06 MED ORDER — DEXTROSE 5 % IV SOLN: 500.0000 mg | INTRAVENOUS | Status: DC

## 2016-02-06 MED ORDER — ONDANSETRON HCL 4 MG PO TABS: 4.0000 mg | ORAL_TABLET | Freq: Four times a day (QID) | ORAL | Status: DC | PRN

## 2016-02-06 MED ORDER — KCL IN DEXTROSE-NACL 20-5-0.45 MEQ/L-%-% IV SOLN
INTRAVENOUS | Status: DC
  Administered 2016-02-06: 02:00:00 via INTRAVENOUS
  Filled 2016-02-06: qty 1000

## 2016-02-06 MED ORDER — DEXTROSE 5 % IV SOLN
500.0000 mg | INTRAVENOUS | Status: DC
  Administered 2016-02-06: 500 mg via INTRAVENOUS
  Filled 2016-02-06: qty 500

## 2016-02-06 MED ORDER — GUAIFENESIN ER 600 MG PO TB12
600.0000 mg | ORAL_TABLET | Freq: Two times a day (BID) | ORAL | Status: DC
  Administered 2016-02-06 – 2016-02-08 (×5): 600 mg via ORAL
  Filled 2016-02-06 (×5): qty 1

## 2016-02-06 MED ORDER — ACETAMINOPHEN 325 MG PO TABS: 650.0000 mg | ORAL_TABLET | Freq: Four times a day (QID) | ORAL | Status: DC | PRN

## 2016-02-06 MED ORDER — ALBUTEROL SULFATE HFA 108 (90 BASE) MCG/ACT IN AERS: 2.0000 | INHALATION_SPRAY | RESPIRATORY_TRACT | Status: DC | PRN

## 2016-02-06 MED ORDER — SODIUM CHLORIDE 0.9% FLUSH
3.0000 mL | Freq: Two times a day (BID) | INTRAVENOUS | Status: DC
  Administered 2016-02-06 – 2016-02-07 (×4): 3 mL via INTRAVENOUS

## 2016-02-06 MED ORDER — FUROSEMIDE 10 MG/ML IJ SOLN
20.0000 mg | Freq: Once | INTRAMUSCULAR | Status: AC
  Administered 2016-02-06: 20 mg via INTRAVENOUS
  Filled 2016-02-06: qty 2

## 2016-02-06 MED ORDER — ACETAMINOPHEN 650 MG RE SUPP: 650.0000 mg | Freq: Four times a day (QID) | RECTAL | Status: DC | PRN

## 2016-02-06 MED ORDER — ONDANSETRON HCL 4 MG/2ML IJ SOLN: 4.0000 mg | Freq: Four times a day (QID) | INTRAMUSCULAR | Status: DC | PRN

## 2016-02-06 MED ORDER — ALBUTEROL SULFATE (2.5 MG/3ML) 0.083% IN NEBU: 2.5000 mg | INHALATION_SOLUTION | RESPIRATORY_TRACT | Status: DC | PRN

## 2016-02-06 MED ORDER — HYDRALAZINE HCL 20 MG/ML IJ SOLN: 10.0000 mg | Freq: Three times a day (TID) | INTRAMUSCULAR | Status: DC | PRN

## 2016-02-06 MED ORDER — BENAZEPRIL HCL 10 MG PO TABS
20.0000 mg | ORAL_TABLET | Freq: Every day | ORAL | Status: DC
  Administered 2016-02-06 – 2016-02-08 (×3): 20 mg via ORAL
  Filled 2016-02-06 (×3): qty 2

## 2016-02-06 MED ORDER — HYDROCODONE-ACETAMINOPHEN 5-325 MG PO TABS
1.0000 | ORAL_TABLET | Freq: Two times a day (BID) | ORAL | Status: DC | PRN
  Administered 2016-02-06 – 2016-02-07 (×3): 1 via ORAL
  Administered 2016-02-07 – 2016-02-08 (×2): 2 via ORAL
  Filled 2016-02-06: qty 1
  Filled 2016-02-06: qty 2
  Filled 2016-02-06 (×2): qty 1
  Filled 2016-02-06: qty 2

## 2016-02-06 MED ORDER — DEXTROSE 5 % IV SOLN: 250.0000 mg | INTRAVENOUS | Status: DC

## 2016-02-06 MED ORDER — ATENOLOL 25 MG PO TABS
25.0000 mg | ORAL_TABLET | Freq: Every day | ORAL | Status: DC
  Administered 2016-02-06 – 2016-02-08 (×3): 25 mg via ORAL
  Filled 2016-02-06 (×3): qty 1

## 2016-02-06 MED ORDER — ENOXAPARIN SODIUM 40 MG/0.4ML ~~LOC~~ SOLN
40.0000 mg | SUBCUTANEOUS | Status: DC
Start: 2016-02-06 — End: 2016-02-08
  Administered 2016-02-06 – 2016-02-07 (×2): 40 mg via SUBCUTANEOUS
  Filled 2016-02-06 (×3): qty 0.4

## 2016-02-06 MED ORDER — IPRATROPIUM-ALBUTEROL 0.5-2.5 (3) MG/3ML IN SOLN
3.0000 mL | Freq: Four times a day (QID) | RESPIRATORY_TRACT | Status: DC
  Administered 2016-02-06 – 2016-02-08 (×7): 3 mL via RESPIRATORY_TRACT
  Filled 2016-02-06 (×7): qty 3

## 2016-02-06 NOTE — ED Notes (Signed)
Report called to [place], Charity fundraiser. Patients belongings transported with her.

## 2016-02-06 NOTE — Progress Notes (Signed)
  Echocardiogram 2D Echocardiogram has been performed.  Tye Savoy 02/06/2016, 10:56 AM

## 2016-02-06 NOTE — Progress Notes (Signed)
PROGRESS NOTE                                                                                                                                                                                                             Patient Demographics:    [name], is a 65 Warner.o. female, DOB - 1950-12-06, ZOX:[id number]  Admit date - 02/05/2016   Admitting Physician Haydee Salter, MD  Outpatient Primary MD for the patient is No primary care provider on file.  LOS - 1  Chief Complaint  Patient presents with  . Respiratory Distress  . Cough       Brief Narrative  Toni GRIFFEY is a 65 Warner.o. Female with a PMH of HTN, HLD,Former smoker who presented to ED with 1 month history of shortness of breath and cough (failed outpatient antibiotic). She was found to have pneumonia on x-ray chest, and admitted to the hospital for treatment of pneumonia and possible undiagnosed COPD with exacerbation.    Subjective:    Breathing has improved but continues to wheeze. Throat is sore from coughing.    Assessment  & Plan :  Community-acquired Pneumonia:  Confirmed by AP/Lat Chest x-ray. Afebrile, nontoxic appearance, with wheezing. Started on  Rocephin and, Azithromycin, she appears clinically improved today compared to yesterday. Blood cultures are pending, will tailor ABX when resulted.  Follow clinical course   Active Problems: Acute respiratory failure with hypoxia [facility]): Likely secondary to pneumonia/COPD above. Resolving with steroids, bronchodilators and empiric antibiotics. Continue to down titrate oxygen as tolerated.   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 She is wheezing on exam, but moving air well. Resume IV steroids, scheduled bronchodilators, antibiotics as above. Once she is more stable, she will require outpatient primary function tests  Mildly  decompensated diastolic heart failure: Echo confirmed mild diastolic dysfunction, we will start low dose lasix and follow.   HTN (hypertension): Continue amlodipine, atenolol and benazepril. Follow and adjust accordingly.   Glaucoma: Continue home medications  Hyperglycemia: Secondary to steroid use. Will place on SSI. Check A1c. Monitor CBG.   Code Status :  Full code  Family Communication  :  Sister Angelique Blonder at the bedside  Disposition Plan  :   Stay inpatient- Continues to improve and Culture come back negative  discharge in 2-3 day  Consults  : None  Procedures  :  None   DVT Prophylaxis  :   Lovenox   Lab Results  Component Value Date   PLT 151 02/06/2016    Inpatient Medications  Scheduled Meds: . amLODipine  10 mg Oral Daily  . atenolol  25 mg Oral Daily  . azithromycin  500 mg Intravenous Q24H  . benazepril  20 mg Oral Daily  . cefTRIAXone (ROCEPHIN)  IV  1 g Intravenous Q24H  . enoxaparin (LOVENOX) injection  40 mg Subcutaneous Q24H  . latanoprost  1 drop Both Eyes QHS  . sodium chloride flush  3 mL Intravenous Q12H  . timolol  1 drop Both Eyes BID   Continuous Infusions: . dextrose 5 % and 0.45 % NaCl with KCl 20 mEq/L 100 mL/hr at 02/06/16 0203   PRN Meds:.acetaminophen **OR** acetaminophen, albuterol, hydrALAZINE, ondansetron **OR** ondansetron (ZOFRAN) IV  Antibiotics  :    Anti-infectives    Start     Dose/Rate Route Frequency Ordered Stop   02/06/16 2200  azithromycin (ZITHROMAX) 500 mg in dextrose 5 % 250 mL IVPB  Status:  Discontinued     500 mg 250 mL/hr over 60 Minutes Intravenous Every 24 hours 02/05/16 2114 02/06/16 0123   02/06/16 2200  cefTRIAXone (ROCEPHIN) 1 g in dextrose 5 % 50 mL IVPB     1 g 100 mL/hr over 30 Minutes Intravenous Every 24 hours 02/05/16 2114     02/06/16 2200  azithromycin (ZITHROMAX) 500 mg in dextrose 5 % 250 mL IVPB  Status:  Discontinued     500 mg 250 mL/hr over 60 Minutes Intravenous Every 24 hours 02/06/16  0123 02/06/16 0136   02/06/16 2200  azithromycin (ZITHROMAX) 500 mg in dextrose 5 % 250 mL IVPB  Status:  Discontinued     500 mg 250 mL/hr over 60 Minutes Intravenous Every 24 hours 02/06/16 0137 02/06/16 0638   02/06/16 2200  azithromycin (ZITHROMAX) 250 mg in dextrose 5 % 125 mL IVPB  Status:  Discontinued     250 mg 125 mL/hr over 60 Minutes Intravenous Every 24 hours 02/06/16 0638 02/06/16 0644   02/06/16 2200  azithromycin (ZITHROMAX) 500 mg in dextrose 5 % 250 mL IVPB     500 mg 250 mL/hr over 60 Minutes Intravenous Every 24 hours 02/06/16 0645     02/05/16 2045  cefTRIAXone (ROCEPHIN) 1 g in dextrose 5 % 50 mL IVPB     1 g 100 mL/hr over 30 Minutes Intravenous  Once 02/05/16 2042 02/05/16 2247   02/05/16 2045  azithromycin (ZITHROMAX) 500 mg in dextrose 5 % 250 mL IVPB     500 mg 250 mL/hr over 60 Minutes Intravenous  Once 02/05/16 2042 02/06/16 0011         Objective:   Filed Vitals:   02/06/16 0046 02/06/16 0121 02/06/16 0123 02/06/16 0348  BP: 108/59 130/81  132/78  Pulse: 93 90  95  Temp:  97.7 F (36.5 C)  98.3 F (36.8 C)  TempSrc:  Oral  Oral  Resp: Height:   5' (1.524 m)   Weight:   92.8 kg (204 lb 9.4 oz)   SpO2: 93% 96%  95%    Wt Readings from Last 3 Encounters:  02/06/16 92.8 kg (204 lb 9.4 oz)  02/25/08 73.936 kg (163 lb)     Intake/Output Summary (Last 24 hours) at 02/06/16 0938 Last data filed at 02/06/16 0600  Gross per 24 hour  Intake    635 ml  Output      0 ml  Net    635 ml     Physical Exam  Awake Alert, Oriented X 3, No new F.N deficits, Normal affect [place].AT,PERRAL Supple Neck,No JVD, No cervical lymphadenopathy appriciated.  Symmetrical Chest wall movement, wheezing bilaterally RRR,No Gallops,Rubs or new Murmurs, No Parasternal Heave +ve B.Sounds, Abd Soft, No tenderness, No organomegaly appriciated, No rebound - guarding or rigidity. + Peripheral edema No Cyanosis, Clubbing , No new Rash or bruise      Data  Review:    CBC  Recent Labs Lab 02/05/16 1758 02/06/16 0447  WBC 10.1 9.0  HGB 14.3 13.3  HCT 42.9 40.6  PLT 147* 151  MCV 85.0 85.3  MCH 28.3 27.9  MCHC 33.3 32.8  RDW 17.4* 17.3*  LYMPHSABS 1.7  --   MONOABS 1.4*  --   EOSABS 0.3  --   BASOSABS 0.1  --     Chemistries   Recent Labs Lab 02/05/16 1758 02/06/16 0447  NA 137 138  K 3.9 4.3  CL 105 106  CO2 25 26  GLUCOSE 93 224*  BUN 17 21*  CREATININE 0.85 1.00  CALCIUM 9.0 8.8*   ------------------------------------------------------------------------------------------------------------------ No results for input(s): CHOL, HDL, LDLCALC, TRIG, CHOLHDL, LDLDIRECT in the last 72 hours.  No results found for: HGBA1C ------------------------------------------------------------------------------------------------------------------ No results for input(s): TSH, T4TOTAL, T3FREE, THYROIDAB in the last 72 hours.  Invalid input(s): FREET3 ------------------------------------------------------------------------------------------------------------------ No results for input(s): VITAMINB12, FOLATE, FERRITIN, TIBC, IRON, RETICCTPCT in the last 72 hours.  Coagulation profile No results for input(s): INR, PROTIME in the last 168 hours.  No results for input(s): DDIMER in the last 72 hours.  Cardiac Enzymes No results for input(s): CKMB, TROPONINI, MYOGLOBIN in the last 168 hours.  Invalid input(s): CK ------------------------------------------------------------------------------------------------------------------ No results found for: BNP  Micro Results No results found for this or any previous visit (from the past 240 hour(s)).  Radiology Reports Dg Chest 2 View  02/05/2016  CLINICAL DATA:  Chronic productive cough and chest soreness. Initial encounter. EXAM: CHEST  2 VIEW COMPARISON:  Chest radiograph performed 11/24/2010 FINDINGS: The lungs are well-aerated. Vascular congestion is noted. Patchy right midlung and  left basilar airspace opacities raise concern for pneumonia. No pleural effusion or pneumothorax is seen. The heart is normal in size; the mediastinal contour is within normal limits. No acute osseous abnormalities are seen. IMPRESSION: Vascular congestion noted. Patchy right midlung and left basilar airspace opacities raise concern for pneumonia. Followup PA and lateral chest X-ray is recommended in 3-4 weeks following trial of antibiotic therapy to ensure resolution and exclude underlying malignancy. Electronically Signed   By: Roanna Raider M.D.   On: 02/05/2016 18:45    Time Spent in minutes  30   Debbra Riding PA-S on 02/06/2016 at 9:38 AM  Attending MD note  Patient was seen, examined,treatment plan was discussed with the PA-S.  I have personally reviewed the clinical findings, lab, imaging studies and management of this patient in detail. I agree with the documentation, as recorded by the PA-S.   Patient is is improved compared to yesterday, she is breathing better, cough continues.  On Exam: Gen. 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

## 2016-02-06 NOTE — Progress Notes (Signed)
Inpatient Diabetes Program Recommendations  AACE/ADA: New Consensus Statement on Inpatient Glycemic Control (2015)  Target Ranges:  Prepandial:   less than 140 mg/dL      Peak postprandial:   less than 180 mg/dL (1-2 hours)      Critically ill patients:  140 - 180 mg/dL   Results for CORI, ALEXIE (MRN [id number]) as of 02/06/2016 10:50  Ref. Range 02/06/2016 04:47  Glucose Latest Ref Range: 65-99 mg/dL 675 (H)    Admit with: SOB  History: HTN     MD- Note patient currently receiving IV Solumedrol 60 mg Q8 hours.  Lab glucose elevated this AM on BMET.  Please consider starting Novolog Sensitive Correction Scale/ SSI (0-9 units) TID AC + HS while patient receiving IV steroids.      --Will follow patient during hospitalization--  Ambrose Finland RN, MSN, CDE Diabetes Coordinator Inpatient Glycemic Control Team Team Pager: [phone] (8a-5p)

## 2016-02-06 NOTE — Progress Notes (Signed)
*  PRELIMINARY RESULTS* Vascular Ultrasound Lower extremity venous duplex has been completed.  Preliminary findings: No evidence of DVT or baker's cyst.   Farrel Demark, RDMS, RVT  02/06/2016, 3:01 PM

## 2016-02-07 DIAGNOSIS — J9601 Acute respiratory failure with hypoxia: Secondary | ICD-10-CM

## 2016-02-07 DIAGNOSIS — I5033 Acute on chronic diastolic (congestive) heart failure: Secondary | ICD-10-CM | POA: Diagnosis not present

## 2016-02-07 DIAGNOSIS — J189 Pneumonia, unspecified organism: Secondary | ICD-10-CM | POA: Diagnosis not present

## 2016-02-07 DIAGNOSIS — I5031 Acute diastolic (congestive) heart failure: Secondary | ICD-10-CM

## 2016-02-07 LAB — BASIC METABOLIC PANEL
Anion gap: 6 (ref 5–15)
BUN: 29 mg/dL — ABNORMAL HIGH (ref 6–20)
CHLORIDE: 107 mmol/L (ref 101–111)
CO2: 29 mmol/L (ref 22–32)
CREATININE: 0.87 mg/dL (ref 0.44–1.00)
Calcium: 8.8 mg/dL — ABNORMAL LOW (ref 8.9–10.3)
GFR calc non Af Amer: >60 mL/min (ref >60)
GLUCOSE: 151 mg/dL — AB (ref 65–99)
Potassium: 4.2 mmol/L (ref 3.5–5.1)
Sodium: 142 mmol/L (ref 135–145)

## 2016-02-07 LAB — HEMOGLOBIN A1C
Hgb A1c MFr Bld: 6.2 % — ABNORMAL HIGH (ref 4.8–5.6)
Mean Plasma Glucose: 131 mg/dL

## 2016-02-07 LAB — CBC
HEMATOCRIT: 41.2 % (ref 36.0–46.0)
HEMOGLOBIN: 13.5 g/dL (ref 12.0–15.0)
MCH: 28 pg (ref 26.0–34.0)
MCHC: 32.8 g/dL (ref 30.0–36.0)
MCV: 85.3 fL (ref 78.0–100.0)
Platelets: 179 10*3/uL (ref 150–400)
RBC: 4.83 MIL/uL (ref 3.87–5.11)
RDW: 17.4 % — AB (ref 11.5–15.5)
WBC: 14.6 10*3/uL — ABNORMAL HIGH (ref 4.0–10.5)

## 2016-02-07 LAB — GLUCOSE, CAPILLARY
GLUCOSE-CAPILLARY: 155 mg/dL — AB (ref 65–99)
GLUCOSE-CAPILLARY: 153 mg/dL — AB (ref 65–99)
Glucose-Capillary: 154 mg/dL — ABNORMAL HIGH (ref 65–99)
Glucose-Capillary: 145 mg/dL — ABNORMAL HIGH (ref 65–99)

## 2016-02-07 MED ORDER — PREDNISONE 20 MG PO TABS
60.0000 mg | ORAL_TABLET | Freq: Every day | ORAL | Status: DC
  Administered 2016-02-08: 60 mg via ORAL
  Filled 2016-02-07: qty 3

## 2016-02-07 MED ORDER — FUROSEMIDE 20 MG PO TABS
20.0000 mg | ORAL_TABLET | Freq: Every day | ORAL | Status: DC
  Administered 2016-02-07 – 2016-02-08 (×2): 20 mg via ORAL
  Filled 2016-02-07 (×2): qty 1

## 2016-02-07 MED ORDER — DEXTROSE 5 % IV SOLN: 500.0000 mg | INTRAVENOUS | Status: DC

## 2016-02-07 MED ORDER — GUAIFENESIN-DM 100-10 MG/5ML PO SYRP
5.0000 mL | ORAL_SOLUTION | ORAL | Status: DC | PRN
  Administered 2016-02-07 – 2016-02-08 (×2): 5 mL via ORAL
  Filled 2016-02-07 (×2): qty 10

## 2016-02-07 MED ORDER — AZITHROMYCIN 250 MG PO TABS
500.0000 mg | ORAL_TABLET | ORAL | Status: DC
  Administered 2016-02-07: 500 mg via ORAL
  Filled 2016-02-07: qty 2

## 2016-02-07 NOTE — Progress Notes (Signed)
Pt states that she is able to walk with no problem and able to purchase medications at present time.

## 2016-02-07 NOTE — Progress Notes (Deleted)
SATURATION QUALIFICATIONS: (This note is used to comply with regulatory documentation for home oxygen)  Patient Saturations on Room Air at Rest = 70%  Patient Saturations on Room Air while Ambulating =  Unable to access due to desaturation to 70% in bed.  Patient Saturations on 4 Liters of oxygen while Ambulating = 92%  Please briefly explain why patient needs home oxygen: Patient desaturated to 70% on Room at rest. Patient required 4 Liters of oxygen to maintain oxygen saturation of 93% while ambulating.   Earnest Conroy. Clelia Croft, RN

## 2016-02-07 NOTE — Progress Notes (Addendum)
When checked with different equipment patient oxygen saturation at rest on RA was 93-96%. When patient ambulated on RA oxygen saturation stayed above 90%. Ranging 92-96% . Will continue to spot check Oxygen saturation. Earnest Conroy. Clelia Croft, RN

## 2016-02-07 NOTE — Progress Notes (Signed)
PHARMACIST - PHYSICIAN COMMUNICATION CONCERNING: Antibiotic IV to Oral Route Change Policy  RECOMMENDATION: This patient is receiving Azithromycin by the intravenous route.  Based on criteria approved by the Pharmacy and Therapeutics Committee, the antibiotic(s) is/are being converted to the equivalent oral dose form(s).   DESCRIPTION: These criteria include:  Patient being treated for a respiratory tract infection, urinary tract infection, cellulitis or clostridium difficile associated diarrhea if on metronidazole  The patient is not neutropenic and does not exhibit a GI malabsorption state  The patient is eating (either orally or via tube) and/or has been taking other orally administered medications for a least 24 hours  The patient is improving clinically and has a Tmax < 100.5  If you have questions about this conversion, please contact the Pharmacy Department  []  ( [phone] )  [place] []  ( [phone]  [facility] []  ( [phone]  [facility] []  ( [phone] )  [facility] [x]  ( [phone] )  [facility]   

## 2016-02-07 NOTE — Progress Notes (Signed)
PROGRESS NOTE                                                                                                                                                                                                             Patient Demographics:    [name], is a 65 y.o. female, DOB - 12-20-1950, ZOX:[id number]  Admit date - 02/05/2016   Admitting Physician Haydee Salter, MD  Outpatient Primary MD for the patient is No primary care provider on file.  LOS - 2  Chief Complaint  Patient presents with  . Respiratory Distress  . Cough       Brief Narrative  [name] is a 65 y.o. Female with a PMH of HTN, HLD,Former smoker who presented to ED with 1 month history of shortness of breath and cough (failed outpatient antibiotic). She was found to have pneumonia on x-ray chest, and admitted to the hospital for treatment of pneumonia and possible undiagnosed COPD with exacerbation.    Subjective:    Breathing has improved but continues to wheeze. Throat is sore from coughing.    Assessment  & Plan :  Community-acquired Pneumonia:   -Confirmed by AP/Lat Chest x-ray. Afebrile, nontoxic appearance, with wheezing.  -Has been treated with ceftriaxone and azithromycin -On 02/07/2016 she reports feeling much better -Lab work showing an upward trending white count 14,600 however I think this could be related to systemic steroids. Clinically she looks much better. -Blood cultures drawn on 02/05/2016 showing no growth 2 sets  Active Problems: Acute respiratory failure with hypoxia [facility]): Likely secondary to pneumonia/COPD above. Resolving with steroids, bronchodilators and empiric antibiotics. Continue to down titrate oxygen as tolerated.   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 She is wheezing on exam, but moving air well. Resume IV steroids,  scheduled bronchodilators, antibiotics as above. -Will transition to prednisone taper starting at 60 mg by mouth daily  Mildly decompensated diastolic heart failure:  Echo performed on 02/06/2016 showing mild diastolic dysfunction Continue Lasix 20 mg by mouth daily  HTN (hypertension): Continue amlodipine, atenolol and benazepril. Follow and adjust accordingly.   Glaucoma: Continue home medications  Hyperglycemia: Secondary to steroid use. Will place on SSI. Check A1c. Monitor CBG.   Code Status :  Full code  Family Communication  :  Sister Angelique Blonder at the bedside  Disposition Plan  :   Anticipate discharge in the next 24 hours if she continues to improve  Consults  : None  Procedures  :  None   DVT Prophylaxis  :   Lovenox   Lab Results  Component Value Date   PLT 179 02/07/2016    Inpatient Medications  Scheduled Meds: . amLODipine  10 mg Oral Daily  . atenolol  25 mg Oral Daily  . azithromycin  500 mg Oral Q24H  . benazepril  20 mg Oral Daily  . cefTRIAXone (ROCEPHIN)  IV  1 g Intravenous Q24H  . enoxaparin (LOVENOX) injection  40 mg Subcutaneous Q24H  . guaiFENesin  600 mg Oral BID  . insulin aspart  0-9 Units Subcutaneous TID WC  . ipratropium-albuterol  3 mL Nebulization Q6H  . latanoprost  1 drop Both Eyes QHS  . methylPREDNISolone (SOLU-MEDROL) injection  60 mg Intravenous Q8H  . sodium chloride flush  3 mL Intravenous Q12H  . timolol  1 drop Both Eyes BID   Continuous Infusions:   PRN Meds:.acetaminophen **OR** acetaminophen, albuterol, hydrALAZINE, HYDROcodone-acetaminophen, ondansetron **OR** ondansetron (ZOFRAN) IV  Antibiotics  :    Anti-infectives    Start     Dose/Rate Route Frequency Ordered Stop   02/07/16 2200  azithromycin (ZITHROMAX) 500 mg in dextrose 5 % 250 mL IVPB  Status:  Discontinued     500 mg 250 mL/hr over 60 Minutes Intravenous Every 24 hours 02/07/16 1043 02/07/16 1044   02/07/16 2200  azithromycin (ZITHROMAX) tablet 500  mg     500 mg Oral Every 24 hours 02/07/16 1044     02/06/16 2200  azithromycin (ZITHROMAX) 500 mg in dextrose 5 % 250 mL IVPB  Status:  Discontinued     500 mg 250 mL/hr over 60 Minutes Intravenous Every 24 hours 02/05/16 2114 02/06/16 0123   02/06/16 2200  cefTRIAXone (ROCEPHIN) 1 g in dextrose 5 % 50 mL IVPB     1 g 100 mL/hr over 30 Minutes Intravenous Every 24 hours 02/05/16 2114     02/06/16 2200  azithromycin (ZITHROMAX) 500 mg in dextrose 5 % 250 mL IVPB  Status:  Discontinued     500 mg 250 mL/hr over 60 Minutes Intravenous Every 24 hours 02/06/16 0123 02/06/16 0136   02/06/16 2200  azithromycin (ZITHROMAX) 500 mg in dextrose 5 % 250 mL IVPB  Status:  Discontinued     500 mg 250 mL/hr over 60 Minutes Intravenous Every 24 hours 02/06/16 0137 02/06/16 0638   02/06/16 2200  azithromycin (ZITHROMAX) 250 mg in dextrose 5 % 125 mL IVPB  Status:  Discontinued     250 mg 125 mL/hr over 60 Minutes Intravenous Every 24 hours 02/06/16 0638 02/06/16 0644   02/06/16 2200  azithromycin (ZITHROMAX) 500 mg in dextrose 5 % 250 mL IVPB  Status:  Discontinued     500 mg 250 mL/hr over 60 Minutes Intravenous Every 24 hours 02/06/16 0645 02/07/16 1043   02/05/16 2045  cefTRIAXone (ROCEPHIN) 1 g in dextrose 5 % 50 mL IVPB     1 g 100 mL/hr over 30 Minutes Intravenous  Once 02/05/16 2042 02/05/16 2247   02/05/16 2045  azithromycin (ZITHROMAX) 500 mg in dextrose 5 % 250 mL IVPB     500 mg 250 mL/hr over 60 Minutes Intravenous  Once 02/05/16 2042 02/06/16 0011         Objective:   Filed Vitals:   02/06/16 1432 02/06/16  2044 02/06/16 2137 02/07/16 0516  BP: 107/58  107/55 119/74  Pulse: 94  84 78  Temp: 97.8 F (36.6 C)  98 F (36.7 C) 97.9 F (36.6 C)  TempSrc: Oral  Oral Oral  Resp: 31  24 22   Height:      Weight:      SpO2: 94% 95% 92% 90%    Wt Readings from Last 3 Encounters:  02/06/16 92.8 kg (204 lb 9.4 oz)  02/25/08 73.936 kg (163 lb)     Intake/Output Summary (Last 24  hours) at 02/07/16 1238 Last data filed at 02/07/16 0839  Gross per 24 hour  Intake    780 ml  Output   1740 ml  Net   -960 ml     Physical Exam  Awake Alert, Oriented X 3, No new F.N deficits, Normal affect [place].AT,PERRAL Supple Neck,No JVD, No cervical lymphadenopathy appriciated.  Symmetrical Chest wall movement, Diminished breath sounds bilaterally, improvement to wheezing RRR,No Gallops,Rubs or new Murmurs, No Parasternal Heave +ve B.Sounds, Abd Soft, No tenderness, No organomegaly appriciated, No rebound - guarding or rigidity. + Peripheral edema No Cyanosis, Clubbing , No new Rash or bruise      Data Review:    CBC  Recent Labs Lab 02/05/16 1758 02/06/16 0447 02/07/16 0456  WBC 10.1 9.0 14.6*  HGB 14.3 13.3 13.5  HCT 42.9 40.6 41.2  PLT 147* 151 179  MCV 85.0 85.3 85.3  MCH 28.3 27.9 28.0  MCHC 33.3 32.8 32.8  RDW 17.4* 17.3* 17.4*  LYMPHSABS 1.7  --   --   MONOABS 1.4*  --   --   EOSABS 0.3  --   --   BASOSABS 0.1  --   --     Chemistries   Recent Labs Lab 02/05/16 1758 02/06/16 0447 02/07/16 0456  NA 137 138 142  K 3.9 4.3 4.2  CL 105 106 107  CO2 25 26 29   GLUCOSE 93 224* 151*  BUN 17 21* 29*  CREATININE 0.85 1.00 0.87  CALCIUM 9.0 8.8* 8.8*   ------------------------------------------------------------------------------------------------------------------ No results for input(s): CHOL, HDL, LDLCALC, TRIG, CHOLHDL, LDLDIRECT in the last 72 hours.  Lab Results  Component Value Date   HGBA1C 6.2* 02/06/2016   ------------------------------------------------------------------------------------------------------------------ No results for input(s): TSH, T4TOTAL, T3FREE, THYROIDAB in the last 72 hours.  Invalid input(s): FREET3 ------------------------------------------------------------------------------------------------------------------ No results for input(s): VITAMINB12, FOLATE, FERRITIN, TIBC, IRON, RETICCTPCT in the last 72  hours.  Coagulation profile No results for input(s): INR, PROTIME in the last 168 hours.  No results for input(s): DDIMER in the last 72 hours.  Cardiac Enzymes No results for input(s): CKMB, TROPONINI, MYOGLOBIN in the last 168 hours.  Invalid input(s): CK ------------------------------------------------------------------------------------------------------------------ No results found for: BNP  Micro Results Recent Results (from the past 240 hour(s))  Blood culture (routine x 2)     Status: None (Preliminary result)   Collection Time: 02/05/16  9:01 PM  Result Value Ref Range Status   Specimen Description BLOOD RIGHT HAND  Final   Special Requests BOTTLES DRAWN AEROBIC AND ANAEROBIC  Final   Culture   Final    NO GROWTH < 24 HOURS Performed at [facility]    Report Status PENDING  Incomplete  Blood culture (routine x 2)     Status: None (Preliminary result)   Collection Time: 02/05/16  9:03 PM  Result Value Ref Range Status   Specimen Description BLOOD LEFT HAND  Final   Special Requests BOTTLES DRAWN AEROBIC  AND ANAEROBIC  Final   Culture   Final    NO GROWTH < 24 HOURS Performed at [facility]    Report Status PENDING  Incomplete    Radiology Reports Dg Chest 2 View  02/05/2016  CLINICAL DATA:  Chronic productive cough and chest soreness. Initial encounter. EXAM: CHEST  2 VIEW COMPARISON:  Chest radiograph performed 11/24/2010 FINDINGS: The lungs are well-aerated. Vascular congestion is noted. Patchy right midlung and left basilar airspace opacities raise concern for pneumonia. No pleural effusion or pneumothorax is seen. The heart is normal in size; the mediastinal contour is within normal limits. No acute osseous abnormalities are seen. IMPRESSION: Vascular congestion noted. Patchy right midlung and left basilar airspace opacities raise concern for pneumonia. Followup PA and lateral chest X-ray is recommended in 3-4 weeks following trial of  antibiotic therapy to ensure resolution and exclude underlying malignancy. Electronically Signed   By: Roanna Raider M.D.   On: 02/05/2016 18:45    Time Spent in minutes  30   Geraline Halberstadt PA-S on 02/07/2016 at 12:38 PM    [facility]

## 2016-02-07 NOTE — Progress Notes (Signed)
SATURATION QUALIFICATIONS: (This note is used to comply with regulatory documentation for home oxygen)  Patient Saturations on Room Air at Rest = 93%  Patient Saturations on Room Air while Ambulating = 93%  Patient Saturations on  Liters of oxygen while Ambulating = %  Please briefly explain why patient needs home oxygen:

## 2016-02-08 ENCOUNTER — Observation Stay (HOSPITAL_COMMUNITY): Payer: Medicare Other

## 2016-02-08 DIAGNOSIS — J189 Pneumonia, unspecified organism: Secondary | ICD-10-CM | POA: Insufficient documentation

## 2016-02-08 DIAGNOSIS — I5033 Acute on chronic diastolic (congestive) heart failure: Secondary | ICD-10-CM | POA: Diagnosis not present

## 2016-02-08 DIAGNOSIS — J9601 Acute respiratory failure with hypoxia: Secondary | ICD-10-CM | POA: Diagnosis not present

## 2016-02-08 LAB — BASIC METABOLIC PANEL
ANION GAP: 6 (ref 5–15)
BUN: 39 mg/dL — ABNORMAL HIGH (ref 6–20)
CALCIUM: 8.8 mg/dL — AB (ref 8.9–10.3)
CO2: 29 mmol/L (ref 22–32)
CREATININE: 0.98 mg/dL (ref 0.44–1.00)
Chloride: 104 mmol/L (ref 101–111)
GFR, EST NON AFRICAN AMERICAN: 59 mL/min — AB (ref >60)
Glucose, Bld: 156 mg/dL — ABNORMAL HIGH (ref 65–99)
Potassium: 3.8 mmol/L (ref 3.5–5.1)
SODIUM: 139 mmol/L (ref 135–145)

## 2016-02-08 LAB — CBC
HCT: 44 % (ref 36.0–46.0)
HEMOGLOBIN: 14 g/dL (ref 12.0–15.0)
MCH: 27.9 pg (ref 26.0–34.0)
MCHC: 31.8 g/dL (ref 30.0–36.0)
MCV: 87.6 fL (ref 78.0–100.0)
PLATELETS: 193 10*3/uL (ref 150–400)
RBC: 5.02 MIL/uL (ref 3.87–5.11)
RDW: 17.9 % — ABNORMAL HIGH (ref 11.5–15.5)
WBC: 15.9 10*3/uL — ABNORMAL HIGH (ref 4.0–10.5)

## 2016-02-08 LAB — GLUCOSE, CAPILLARY
GLUCOSE-CAPILLARY: 114 mg/dL — AB (ref 65–99)
Glucose-Capillary: 100 mg/dL — ABNORMAL HIGH (ref 65–99)

## 2016-02-08 MED ORDER — GUAIFENESIN ER 600 MG PO TB12: 600.0000 mg | ORAL_TABLET | Freq: Two times a day (BID) | ORAL | Status: DC

## 2016-02-08 MED ORDER — IPRATROPIUM-ALBUTEROL 0.5-2.5 (3) MG/3ML IN SOLN: 3.0000 mL | Freq: Three times a day (TID) | RESPIRATORY_TRACT | Status: DC

## 2016-02-08 MED ORDER — PREDNISONE 10 MG (21) PO TBPK: ORAL_TABLET | ORAL | Status: DC

## 2016-02-08 MED ORDER — LEVOFLOXACIN 500 MG PO TABS: 500.0000 mg | ORAL_TABLET | Freq: Every day | ORAL | Status: DC

## 2016-02-08 MED ORDER — FUROSEMIDE 20 MG PO TABS: 20.0000 mg | ORAL_TABLET | Freq: Every day | ORAL | Status: DC

## 2016-02-08 NOTE — Progress Notes (Signed)
Reviewed discharge information with patient and caregiver. Answered all questions. Patient/caregiver able to teach back medications and reasons to contact MD/911. Patient verbalizes importance of PCP follow up appointment.  Mana Morison M. Devlin Mcveigh, RN  

## 2016-02-08 NOTE — Discharge Summary (Signed)
Physician Discharge Summary  [name] YYT:[id number] DOB: [birth year] DOA: 02/05/2016  PCP: No primary care provider on file.  Admit date: 02/05/2016 Discharge date: 02/08/2016  Time spent: 35 minutes  Recommendations for Outpatient Follow-up:  1. Please follow up on volume status she was treated for mild CHF, discharged on lasix 20 mg PO q daily 2. She was discharged on levaquin for CAP   Discharge Diagnoses:  Principal Problem:   Acute respiratory failure with hypoxia (HCC) Active Problems:   HTN (hypertension)   PNA (pneumonia)   Glaucoma   Discharge Condition: Stable  Diet recommendation: Heart Healthy  Filed Weights   02/05/16 1723 02/06/16 0123  Weight: 93.895 kg (207 lb) 92.8 kg (204 lb 9.4 oz)    History of present illness:  [name] is a 65 y.o. female with medical history significant for hypertension, cholesterol high and glaucoma presents to the emergency room with chief complaint of respiratory distress. Patient states that she has been short of breath for the last month. She went to an urgent care with this complaint and was given antibiotics. She states the antibiotics did not help her in the long-term. She was briefly better and then got worse again. Patient went to go get seen today for further evaluation and was found to be hypoxic into the 80s. She was sent over to the emergency room.  Hospital Course:  [name] is a 65 y.o. Female with a PMH of HTN, HLD,Former smoker who presented to ED with 1 month history of shortness of breath and cough (failed outpatient antibiotic). She was found to have pneumonia on x-ray chest, and admitted to the hospital for treatment of pneumonia and COPD with exacerbation.   Acute respiratory failure with hypoxia (HCC):  -Likely secondary to pneumonia/COPD above.  -She was treated with steroids, bronchodilators and empiric antibiotics.  -She significantly improved during this hospitalization -Discharged on  Levaquin   COPD with exacerbation:  -Long-standing history of tobacco abuse (more than 20 years)-she quit 5 years back.  -She was treated with IV steroids -Will discharge on Prednisone taper  Mildly decompensated diastolic heart failure:  -Echo performed on 02/06/2016 showing mild diastolic dysfunction -Discharge on Lasix 20 mg by mouth daily   Discharge Exam: Filed Vitals:   02/07/16 2117 02/08/16 0432  BP: 98/54 122/74  Pulse: 71 66  Temp: 98 F (36.7 C) 98.1 F (36.7 C)  Resp: 22     Awake Alert, Oriented X 3, No new F.N deficits, Normal affect Athena.AT,PERRAL Supple Neck,No JVD, No cervical lymphadenopathy appriciated.  Symmetrical Chest wall movement, Diminished breath sounds bilaterally, improvement to wheezing RRR,No Gallops,Rubs or new Murmurs, No Parasternal Heave +ve B.Sounds, Abd Soft, No tenderness, No organomegaly appriciated, No rebound - guarding or rigidity. No Cyanosis, Clubbing , No new Rash or bruise   Discharge Instructions   Discharge Instructions    Call MD for:  difficulty breathing, headache or visual disturbances    Complete by:  As directed      Call MD for:  extreme fatigue    Complete by:  As directed      Call MD for:  hives    Complete by:  As directed      Call MD for:  persistant dizziness or light-headedness    Complete by:  As directed      Call MD for:  persistant nausea and vomiting    Complete by:  As directed      Call MD for:  redness, tenderness, or signs of infection (pain, swelling, redness, odor or green/yellow discharge around incision site)    Complete by:  As directed      Call MD for:  severe uncontrolled pain    Complete by:  As directed      Call MD for:  temperature >100.4    Complete by:  As directed      Call MD for:    Complete by:  As directed      Diet - low sodium heart healthy    Complete by:  As directed      Increase activity slowly    Complete by:  As directed           Current Discharge Medication  List    START taking these medications   Details  furosemide (LASIX) 20 MG tablet Take 1 tablet (20 mg total) by mouth daily. Qty: 30 tablet, Refills: 1    guaiFENesin (MUCINEX) 600 MG 12 hr tablet Take 1 tablet (600 mg total) by mouth 2 (two) times daily. Qty: 60 tablet, Refills: 0    levofloxacin (LEVAQUIN) 500 MG tablet Take 1 tablet (500 mg total) by mouth daily. Qty: 3 tablet, Refills: 0    predniSONE (STERAPRED UNI-PAK 21 TAB) 10 MG (21) TBPK tablet Take 6-5-4-3-2-1 tablets by mouth daily till gone. Qty: 21 tablet, Refills: 0      CONTINUE these medications which have NOT CHANGED   Details  amLODipine (NORVASC) 10 MG tablet TK 1 T PO QD Refills: 1    atenolol (TENORMIN) 25 MG tablet TK 1 T PO QD Refills: 1    benazepril (LOTENSIN) 20 MG tablet TK 1 T PO QD Refills: 1    HYDROcodone-acetaminophen (NORCO/VICODIN) 5-325 MG tablet TK 1 T PO BID PRN FOR PAIN Refills: 0    latanoprost (XALATAN) 0.005 % ophthalmic solution Place 1 drop into both eyes at bedtime.    LORazepam (ATIVAN) 1 MG tablet TAKE 1 TABLET BY MOUTH TID PRN ANXIETY Refills: 1    timolol (TIMOPTIC) 0.5 % ophthalmic solution INSTILL 1 DROP IN BOTH EYES BID Refills: 3    PROAIR HFA 108 (90 Base) MCG/ACT inhaler INHALE 2 PUFFS INTO THE LUNGS Q 4 HOURS PRN Refills: 0       Allergies  Allergen Reactions  . Percocet [Oxycodone-Acetaminophen] Itching      The results of significant diagnostics from this hospitalization (including imaging, microbiology, ancillary and laboratory) are listed below for reference.    Significant Diagnostic Studies: Dg Chest 2 View  02/08/2016  CLINICAL DATA:  Pneumonia.  Cough. EXAM: CHEST  2 VIEW COMPARISON:  02/05/2016 chest radiograph. FINDINGS: Stable cardiomediastinal silhouette with top-normal heart size. No pneumothorax. No pleural effusion. Mild patchy opacities in the right mid and left lower lungs appear slightly improved. No new consolidative airspace disease.  IMPRESSION: Decreased mild patchy right mid and left lower lung opacities, favor improving pneumonia and/or atelectasis. Recommend follow-up PA and lateral post treatment chest radiographs in 4-6 weeks. Electronically Signed   By: Delbert Phenix M.D.   On: 02/08/2016 08:21   Dg Chest 2 View  02/05/2016  CLINICAL DATA:  Chronic productive cough and chest soreness. Initial encounter. EXAM: CHEST  2 VIEW COMPARISON:  Chest radiograph performed 11/24/2010 FINDINGS: The lungs are well-aerated. Vascular congestion is noted. Patchy right midlung and left basilar airspace opacities raise concern for pneumonia. No pleural effusion or pneumothorax is seen. The heart is normal in size; the mediastinal contour is within normal limits.  No acute osseous abnormalities are seen. IMPRESSION: Vascular congestion noted. Patchy right midlung and left basilar airspace opacities raise concern for pneumonia. Followup PA and lateral chest X-ray is recommended in 3-4 weeks following trial of antibiotic therapy to ensure resolution and exclude underlying malignancy. Electronically Signed   By: Roanna Raider M.D.   On: 02/05/2016 18:45    Microbiology: Recent Results (from the past 240 hour(s))  Blood culture (routine x 2)     Status: None (Preliminary result)   Collection Time: 02/05/16  9:01 PM  Result Value Ref Range Status   Specimen Description BLOOD RIGHT HAND  Final   Special Requests BOTTLES DRAWN AEROBIC AND ANAEROBIC  Final   Culture   Final    NO GROWTH 2 DAYS Performed at [facility]    Report Status PENDING  Incomplete  Blood culture (routine x 2)     Status: None (Preliminary result)   Collection Time: 02/05/16  9:03 PM  Result Value Ref Range Status   Specimen Description BLOOD LEFT HAND  Final   Special Requests BOTTLES DRAWN AEROBIC AND ANAEROBIC  Final   Culture   Final    NO GROWTH 2 DAYS Performed at [facility]    Report Status PENDING  Incomplete     Labs: Basic  Metabolic Panel:  Recent Labs Lab 02/05/16 1758 02/06/16 0447 02/07/16 0456 02/08/16 0343  NA 137 138 142 139  K 3.9 4.3 4.2 3.8  CL 105 106 107 104  CO2 GLUCOSE 93 224* 151* 156*  BUN 17 21* 29* 39*  CREATININE 0.85 1.00 0.87 0.98  CALCIUM 9.0 8.8* 8.8* 8.8*   Liver Function Tests: No results for input(s): AST, ALT, ALKPHOS, BILITOT, PROT, ALBUMIN in the last 168 hours. No results for input(s): LIPASE, AMYLASE in the last 168 hours. No results for input(s): AMMONIA in the last 168 hours. CBC:  Recent Labs Lab 02/05/16 1758 02/06/16 0447 02/07/16 0456 02/08/16 0343  WBC 10.1 9.0 14.6* 15.9*  NEUTROABS 6.6  --   --   --   HGB 14.3 13.3 13.5 14.0  HCT 42.9 40.6 41.2 44.0  MCV 85.0 85.3 85.3 87.6  PLT 147* 151 179 193   Cardiac Enzymes: No results for input(s): CKTOTAL, CKMB, CKMBINDEX, TROPONINI in the last 168 hours. BNP: BNP (last 3 results) No results for input(s): BNP in the last 8760 hours.  ProBNP (last 3 results) No results for input(s): PROBNP in the last 8760 hours.  CBG:  Recent Labs Lab 02/07/16 0714 02/07/16 1141 02/07/16 1652 02/07/16 2115 02/08/16 0740  GLUCAP 154* 155* 153* 145* 114*       Signed:  Jeralyn Bennett MD.  [facility] 02/08/2016, 11:34 AM

## 2016-02-08 NOTE — Progress Notes (Signed)
Patient saturating 94% on RA. States she does not feel short of breath.  Earnest Conroy. Clelia Croft, RN

## 2016-02-10 LAB — CULTURE, BLOOD (ROUTINE X 2)
Culture: NO GROWTH
Culture: NO GROWTH

## 2016-03-06 ENCOUNTER — Other Ambulatory Visit: Payer: Self-pay | Admitting: Neurosurgery

## 2016-03-06 ENCOUNTER — Other Ambulatory Visit: Payer: Self-pay | Admitting: Physician Assistant

## 2016-03-06 ENCOUNTER — Ambulatory Visit
Admission: RE | Admit: 2016-03-06 | Discharge: 2016-03-06 | Disposition: A | Discharge status: Home / Self Care | Payer: Medicare Other | Source: Ambulatory Visit | Attending: Physician Assistant | Admitting: Physician Assistant

## 2016-03-06 DIAGNOSIS — J13 Pneumonia due to Streptococcus pneumoniae: Secondary | ICD-10-CM

## 2016-05-22 ENCOUNTER — Institutional Professional Consult (permissible substitution): Payer: Medicare Other | Admitting: Internal Medicine

## 2016-06-25 ENCOUNTER — Ambulatory Visit (INDEPENDENT_AMBULATORY_CARE_PROVIDER_SITE_OTHER)
Admission: RE | Admit: 2016-06-25 | Discharge: 2016-06-25 | Disposition: A | Discharge status: Home / Self Care | Payer: Medicare Other | Source: Ambulatory Visit | Attending: Internal Medicine | Admitting: Internal Medicine

## 2016-06-25 ENCOUNTER — Other Ambulatory Visit (INDEPENDENT_AMBULATORY_CARE_PROVIDER_SITE_OTHER): Payer: Medicare Other

## 2016-06-25 ENCOUNTER — Ambulatory Visit (INDEPENDENT_AMBULATORY_CARE_PROVIDER_SITE_OTHER): Payer: Medicare Other | Admitting: Internal Medicine

## 2016-06-25 ENCOUNTER — Encounter: Payer: Self-pay | Admitting: Internal Medicine

## 2016-06-25 VITALS — BP 132/68 | HR 92 | Ht 61.0 in | Wt 204.1 lb

## 2016-06-25 DIAGNOSIS — I10 Essential (primary) hypertension: Secondary | ICD-10-CM

## 2016-06-25 DIAGNOSIS — R0600 Dyspnea, unspecified: Secondary | ICD-10-CM

## 2016-06-25 DIAGNOSIS — R0609 Other forms of dyspnea: Secondary | ICD-10-CM

## 2016-06-25 LAB — BASIC METABOLIC PANEL
BUN: 25 mg/dL — AB (ref 6–23)
CHLORIDE: 105 meq/L (ref 96–112)
CO2: 29 mEq/L (ref 19–32)
Calcium: 10 mg/dL (ref 8.4–10.5)
Creatinine, Ser: 1.04 mg/dL (ref 0.40–1.20)
GFR: 56.31 mL/min — ABNORMAL LOW (ref >60.00)
GLUCOSE: 110 mg/dL — AB (ref 70–99)
POTASSIUM: 4.3 meq/L (ref 3.5–5.1)
SODIUM: 144 meq/L (ref 135–145)

## 2016-06-25 LAB — CBC WITH DIFFERENTIAL/PLATELET
Basophils Absolute: 0 10*3/uL (ref 0.0–0.1)
Basophils Relative: 0.3 % (ref 0.0–3.0)
Eosinophils Absolute: 0.1 10*3/uL (ref 0.0–0.7)
Eosinophils Relative: 1.2 % (ref 0.0–5.0)
HCT: 49.8 % — ABNORMAL HIGH (ref 36.0–46.0)
Hemoglobin: 16.7 g/dL — ABNORMAL HIGH (ref 12.0–15.0)
Lymphocytes Relative: 14.4 % (ref 12.0–46.0)
Lymphs Abs: 1.5 10*3/uL (ref 0.7–4.0)
MCHC: 33.6 g/dL (ref 30.0–36.0)
MCV: 93 fl (ref 78.0–100.0)
Monocytes Absolute: 1 10*3/uL (ref 0.1–1.0)
Monocytes Relative: 10 % (ref 3.0–12.0)
Neutro Abs: 7.7 10*3/uL (ref 1.4–7.7)
Neutrophils Relative %: 74.1 % (ref 43.0–77.0)
Platelets: 176 10*3/uL (ref 150.0–400.0)
RBC: 5.36 Mil/uL — ABNORMAL HIGH (ref 3.87–5.11)
RDW: 15.6 % — ABNORMAL HIGH (ref 11.5–15.5)
WBC: 10.4 10*3/uL (ref 4.0–10.5)

## 2016-06-25 LAB — TSH: TSH: 1.53 u[IU]/mL (ref 0.35–4.50)

## 2016-06-25 LAB — BRAIN NATRIURETIC PEPTIDE: PRO B NATRI PEPTIDE: 141 pg/mL — AB (ref 0.0–100.0)

## 2016-06-25 MED ORDER — IRBESARTAN 150 MG PO TABS: 150.0000 mg | ORAL_TABLET | Freq: Every day | ORAL | 11 refills | Status: DC

## 2016-06-25 MED ORDER — AMLODIPINE BESYLATE 10 MG PO TABS: ORAL_TABLET | ORAL | 1 refills | Status: DC

## 2016-06-25 NOTE — Progress Notes (Signed)
Subjective:     Patient ID: [name], female   DOB: [birth year],    MRN: [id number]  HPI  65 yowf quit smoking 2012 due to some cough/wheezing improved p quit at wt 180 referred to pulmonary clinic 06/25/2016 by Dr   [name] s/p admit:  Admit date: 02/05/2016 Discharge date: 02/08/2016    Recommendations for Outpatient Follow-up:  1. Please follow up on volume status she was treated for mild CHF, discharged on lasix 20 mg PO q daily 2. She was discharged on levaquin for CAP   Discharge Diagnoses:  Principal Problem:   Acute respiratory failure with hypoxia (HCC) Active Problems:   HTN (hypertension)   PNA (pneumonia)   Glaucoma   Discharge Condition: Stable  Diet recommendation: Heart Healthy      Filed Weights   02/05/16 1723 02/06/16 0123  Weight: 93.895 kg (207 lb) 92.8 kg (204 lb 9.4 oz)    History of present illness:  [name] is a 65 y.o. female with medical history significant for hypertension, cholesterol high and glaucoma presents to the emergency room with chief complaint of respiratory distress. Patient states that she has been short of breath for the last month. She went to an urgent care with this complaint and was given antibiotics. She states the antibiotics did not help her in the long-term. She was briefly better and then got worse again. Patient went to go get seen today for further evaluation and was found to be hypoxic into the 80s. She was sent over to the emergency room.  Hospital Course:  [name] is a 65 y.o. Female with a PMH of HTN, HLD,Former smoker who presented to ED with 1 month history of shortness of breath and cough (failed outpatient antibiotic). She was found to have pneumonia on x-ray chest, and admitted to the hospital for treatment of pneumonia and COPD with exacerbation.   Acute respiratory failure with hypoxia (HCC):  -Likely secondary to pneumonia/COPD above.  -She was treated with steroids,  bronchodilators and empiric antibiotics.  -She significantly improved during this hospitalization -Discharged on Levaquin   COPD with exacerbation:  -Long-standing history of tobacco abuse (more than 20 years)-she quit 5 years back.  -She was treated with IV steroids -Will discharge on Prednisone taper  Mildly decompensated diastolic heart failure:  -Echo performed on 02/06/2016 showing mild diastolic dysfunction -Discharge on Lasix 20 mg by mouth daily    06/25/2016 1st [facility] office visit/ Veniamin Kincaid   Chief Complaint  Patient presents with  . Consult visit    COPD consult per [name]- Pt states SOB w/ excertion. No congestion or tightness. Occasional coughing at times.   onset of sob spring 2017 x one month prior to admit on 02/05/16 gradually worse while on lisinopril  In April 2017 was using [facility] parking but still able to do [employer]  Now sob across the room  Sob occurs at rest if speaking  Able to lie down rotated on to left side otherwise chokes/ smothers  No obvious day to day or daytime variability or assoc excess/ purulent sputum or mucus plugs or hemoptysis or cp or chest tightness, subjective wheeze or overt sinus or hb symptoms. No unusual exp hx or h/o childhood pna/ asthma or knowledge of premature birth.  Sleeping ok without nocturnal  or early am exacerbation  of respiratory  c/o's or need for noct saba. Also denies any obvious fluctuation of symptoms with weather or environmental changes or other aggravating  or alleviating factors except as outlined above   Current Medications, Allergies, Complete Past Medical History, Past Surgical History, Family History, and Social History were reviewed in [facility] electronic medical record.  ROS  The following are not active complaints unless bolded sore throat, dysphagia, dental problems, itching, sneezing,  nasal congestion or excess/ purulent secretions, ear ache,   fever, chills, sweats, unintended wt loss,  classically pleuritic or exertional cp,  orthopnea pnd or leg swelling, presyncope, palpitations, abdominal pain, anorexia, nausea, vomiting, diarrhea  or change in bowel or bladder habits, change in stools or urine, dysuria,hematuria,  rash, arthralgias, visual complaints, headache, numbness, weakness or ataxia or problems with walking or coordination,  change in mood/affect or memory.         Review of Systems     Objective:   Physical Exam Obese wf nad easily confused about details of care/ med names    Wt Readings from Last 3 Encounters:  06/25/16 204 lb 1.6 oz (92.6 kg)  02/06/16 204 lb 9.4 oz (92.8 kg)  02/25/08 163 lb (73.9 kg)    Vital signs reviewed  - note sats recorded at 87% but did not correlate with pulse so likely spurious  HEENT: nl dentition, turbinates, and oropharynx. Nl external ear canals without cough reflex   NECK :  without JVD/Nodes/TM/ nl carotid upstrokes bilaterally   LUNGS: no acc muscle use,  Nl contour chest which is clear to A and P bilaterally without cough on insp or exp maneuvers   CV:  RRR  no s3 or murmur or increase in P2, 1+ sym bil pedal edema   ABD:  Tensely obese soft and nontender with nl inspiratory excursion in the supine position. No bruits or organomegaly, bowel sounds nl  MS:  Nl gait/ ext warm without deformities, calf tenderness, cyanosis or clubbing No obvious joint restrictions   SKIN: warm and dry without lesions    NEURO:  alert, approp, nl sensorium with  no motor deficits     CXR PA and Lateral:   06/25/2016 :    I personally reviewed images and agree with radiology impression as follows:   Findings felt to represent a degree of congestive heart failure. No airspace consolidation. Aortic atherosclerosis.   Labs ordered/ reviewed:      Chemistry      Component Value Date/Time   NA 144 06/25/2016 1308   K 4.3 06/25/2016 1308   CL 105 06/25/2016 1308   CO2 29 06/25/2016 1308   BUN 25 (H) 06/25/2016 1308    CREATININE 1.04 06/25/2016 1308      Component Value Date/Time   CALCIUM 10.0 06/25/2016 1308        Lab Results  Component Value Date   WBC 10.4 06/25/2016   HGB 16.7 (H) 06/25/2016   HCT 49.8 (H) 06/25/2016   MCV 93.0 06/25/2016   PLT 176.0 06/25/2016        Lab Results  Component Value Date   TSH 1.53 06/25/2016     Lab Results  Component Value Date   PROBNP 141.0 (H) 06/25/2016    Also D dimer ordered 06/25/2016        Assessment:

## 2016-06-25 NOTE — Patient Instructions (Addendum)
Stop lotensin and start ibesartan 150 mg one daily  And your breathing should gradually improve  Please remember to go to the lab and x-ray department downstairs for your tests - we will call you with the results when they are available.     Please schedule a follow up office visit in 2 weeks, sooner if needed to see [name] to recheck your blood pressure  Add double the lasix dose for now

## 2016-06-26 LAB — D-DIMER, QUANTITATIVE: D-Dimer, Quant: 0.47 mcg/mL FEU (ref <0.50)

## 2016-06-26 NOTE — Progress Notes (Signed)
Spoke with pt and notified of results per Dr. Wert. Pt verbalized understanding and denied any questions. 

## 2016-06-26 NOTE — Assessment & Plan Note (Signed)
Body mass index is 38.56  Lab Results  Component Value Date   TSH 1.53 06/25/2016     Contributing to gerd tendency/ doe/reviewed the need and the process to achieve and maintain neg calorie balance > defer f/u primary care including intermittently monitoring thyroid status

## 2016-06-26 NOTE — Progress Notes (Signed)
Pt aware.

## 2016-06-26 NOTE — Assessment & Plan Note (Signed)
Spirometry 06/25/2016  Purely restrictive with FVC  43% , no obst on f/v loop at all  Trial off acei 06/25/2016 >>>  Symptoms are markedly disproportionate to objective findings and not clear this is a lung problem but pt does appear to have difficult airway management issues. DDX of  difficult airways management almost all start with A and  include Adherence, Ace Inhibitors, Acid Reflux, Active Sinus Disease, Alpha 1 Antitripsin deficiency, Anxiety masquerading as Airways dz,  ABPA,  Allergy(esp in young), Aspiration (esp in elderly), Adverse effects of meds,  Active smokers, A bunch of PE's (a small clot burden can't cause this syndrome unless there is already severe underlying pulm or vascular dz with poor reserve) plus two Bs  = Bronchiectasis and Beta blocker use..and one C= CHF   Adherence is always the initial "prime suspect" and is a multilayered concern that requires a "trust but verify" approach in every patient - starting with knowing how to use medications, especially inhalers, correctly, keeping up with refills and understanding the fundamental difference between maintenance and prns vs those medications only taken for a very short course and then stopped and not refilled.  - very easily confused with details of care  ACEi top of the usual list of suspects (see separate a/p)   ? Allergy/asthma  Since this is not copd > doubt it s cough or rhinitis assoc  ? A bunch of PE's >  Check d dimer  ? BB effects > on Tenormin and timoptis , ok for now to leave on  but prefer in this setting: Bystolic, the most beta -1  selective Beta blocker available in sample form, with bisoprolol the most selective generic choice  on the market and bisoptic eyedrops for the same reason should she flare with wheezing  ? chf > bnp relatively low but prob has element of diastolic dysfunction and edema on exam so rec double lasix   Total time devoted to counseling  = 35/12m review case with pt/ discussion of  options/alternatives/ personally creating written instructions  in presence of pt  then going over those specific  Instructions directly with the pt including how to use all of the meds but in particular covering each new medication in detail and the difference between the maintenance/automatic meds and the prns using an action plan format for the latter.

## 2016-06-26 NOTE — Assessment & Plan Note (Signed)
In the best review of chronic cough to date ( NEJM 2016 375 [phone]) ,  ACEi are now felt to cause cough in up to  20% of pts which is a 4 fold increase from previous reports and does not include the variety of non-specific complaints we see in pulmonary clinic in pts on ACEi but previously attributed to another dx like  Copd/asthma and  include PNDS, throat and chest congestion, "bronchitis", unexplained dyspnea and noct "strangling" sensations, and hoarseness, but also  atypical /refractory GERD symptoms like dysphagia and "bad heartburn"   The only way I know  to prove this is not an "ACEi Case" is a trial off ACEi x a minimum of 6 weeks then regroup.   Try avapro 150 mg at hs

## 2016-07-23 ENCOUNTER — Inpatient Hospital Stay (HOSPITAL_COMMUNITY)
Admission: EM | Admit: 2016-07-23 | Discharge: 2016-07-27 | DRG: 291 | Disposition: A | Discharge status: Home / Self Care | Payer: Medicare Other | Attending: Nephrology | Admitting: Nephrology

## 2016-07-23 ENCOUNTER — Encounter (HOSPITAL_COMMUNITY): Payer: Self-pay

## 2016-07-23 ENCOUNTER — Ambulatory Visit (INDEPENDENT_AMBULATORY_CARE_PROVIDER_SITE_OTHER)
Admission: RE | Admit: 2016-07-23 | Discharge: 2016-07-23 | Disposition: A | Discharge status: Home / Self Care | Payer: Medicare Other | Source: Ambulatory Visit | Attending: Internal Medicine | Admitting: Internal Medicine

## 2016-07-23 ENCOUNTER — Encounter: Payer: Self-pay | Admitting: Internal Medicine

## 2016-07-23 ENCOUNTER — Ambulatory Visit (INDEPENDENT_AMBULATORY_CARE_PROVIDER_SITE_OTHER): Payer: Medicare Other | Admitting: Internal Medicine

## 2016-07-23 VITALS — BP 152/90 | HR 113 | Ht 60.0 in | Wt 204.0 lb

## 2016-07-23 DIAGNOSIS — J441 Chronic obstructive pulmonary disease with (acute) exacerbation: Secondary | ICD-10-CM | POA: Diagnosis present

## 2016-07-23 DIAGNOSIS — Z6841 Body Mass Index (BMI) 40.0 and over, adult: Secondary | ICD-10-CM

## 2016-07-23 DIAGNOSIS — I10 Essential (primary) hypertension: Secondary | ICD-10-CM

## 2016-07-23 DIAGNOSIS — E7800 Pure hypercholesterolemia, unspecified: Secondary | ICD-10-CM | POA: Diagnosis present

## 2016-07-23 DIAGNOSIS — H409 Unspecified glaucoma: Secondary | ICD-10-CM | POA: Diagnosis present

## 2016-07-23 DIAGNOSIS — N179 Acute kidney failure, unspecified: Secondary | ICD-10-CM | POA: Diagnosis not present

## 2016-07-23 DIAGNOSIS — R0602 Shortness of breath: Secondary | ICD-10-CM | POA: Diagnosis present

## 2016-07-23 DIAGNOSIS — Z885 Allergy status to narcotic agent status: Secondary | ICD-10-CM

## 2016-07-23 DIAGNOSIS — I4891 Unspecified atrial fibrillation: Secondary | ICD-10-CM

## 2016-07-23 DIAGNOSIS — Z79899 Other long term (current) drug therapy: Secondary | ICD-10-CM | POA: Diagnosis not present

## 2016-07-23 DIAGNOSIS — R0609 Other forms of dyspnea: Secondary | ICD-10-CM

## 2016-07-23 DIAGNOSIS — I5033 Acute on chronic diastolic (congestive) heart failure: Secondary | ICD-10-CM | POA: Diagnosis not present

## 2016-07-23 DIAGNOSIS — Z8249 Family history of ischemic heart disease and other diseases of the circulatory system: Secondary | ICD-10-CM | POA: Diagnosis not present

## 2016-07-23 DIAGNOSIS — R0689 Other abnormalities of breathing: Secondary | ICD-10-CM

## 2016-07-23 DIAGNOSIS — Z87891 Personal history of nicotine dependence: Secondary | ICD-10-CM

## 2016-07-23 DIAGNOSIS — E785 Hyperlipidemia, unspecified: Secondary | ICD-10-CM | POA: Diagnosis present

## 2016-07-23 DIAGNOSIS — R0600 Dyspnea, unspecified: Secondary | ICD-10-CM

## 2016-07-23 DIAGNOSIS — J9601 Acute respiratory failure with hypoxia: Secondary | ICD-10-CM | POA: Diagnosis not present

## 2016-07-23 DIAGNOSIS — G4733 Obstructive sleep apnea (adult) (pediatric): Secondary | ICD-10-CM | POA: Diagnosis present

## 2016-07-23 DIAGNOSIS — I110 Hypertensive heart disease with heart failure: Secondary | ICD-10-CM | POA: Diagnosis not present

## 2016-07-23 DIAGNOSIS — M1990 Unspecified osteoarthritis, unspecified site: Secondary | ICD-10-CM | POA: Diagnosis present

## 2016-07-23 LAB — CBC WITH DIFFERENTIAL/PLATELET
Basophils Absolute: 0 10*3/uL (ref 0.0–0.1)
Basophils Relative: 0 %
Eosinophils Absolute: 0.1 10*3/uL (ref 0.0–0.7)
Eosinophils Relative: 1 %
HCT: 49.9 % — ABNORMAL HIGH (ref 36.0–46.0)
HEMOGLOBIN: 16.5 g/dL — AB (ref 12.0–15.0)
LYMPHS ABS: 1.5 10*3/uL (ref 0.7–4.0)
LYMPHS PCT: 18 %
MCH: 31 pg (ref 26.0–34.0)
MCHC: 33.1 g/dL (ref 30.0–36.0)
MCV: 93.8 fL (ref 78.0–100.0)
Monocytes Absolute: 0.9 10*3/uL (ref 0.1–1.0)
Monocytes Relative: 11 %
NEUTROS ABS: 5.7 10*3/uL (ref 1.7–7.7)
NEUTROS PCT: 70 %
Platelets: 151 10*3/uL (ref 150–400)
RBC: 5.32 MIL/uL — AB (ref 3.87–5.11)
RDW: 14.7 % (ref 11.5–15.5)
WBC: 8.2 10*3/uL (ref 4.0–10.5)

## 2016-07-23 LAB — URINALYSIS, ROUTINE W REFLEX MICROSCOPIC
Bilirubin Urine: NEGATIVE
Glucose, UA: NEGATIVE mg/dL
Hgb urine dipstick: NEGATIVE
Ketones, ur: NEGATIVE mg/dL
NITRITE: NEGATIVE
PROTEIN: 30 mg/dL — AB
Specific Gravity, Urine: 1.021 (ref 1.005–1.030)
pH: 6 (ref 5.0–8.0)

## 2016-07-23 LAB — URINE MICROSCOPIC-ADD ON

## 2016-07-23 LAB — COMPREHENSIVE METABOLIC PANEL
ALK PHOS: 72 U/L (ref 38–126)
ALT: 19 U/L (ref 14–54)
AST: 25 U/L (ref 15–41)
Albumin: 3.8 g/dL (ref 3.5–5.0)
Anion gap: 7 (ref 5–15)
BUN: 18 mg/dL (ref 6–20)
CALCIUM: 9.8 mg/dL (ref 8.9–10.3)
CO2: 26 mmol/L (ref 22–32)
CREATININE: 0.98 mg/dL (ref 0.44–1.00)
Chloride: 108 mmol/L (ref 101–111)
GFR calc non Af Amer: 59 mL/min — ABNORMAL LOW (ref >60)
Glucose, Bld: 132 mg/dL — ABNORMAL HIGH (ref 65–99)
Potassium: 4.1 mmol/L (ref 3.5–5.1)
SODIUM: 141 mmol/L (ref 135–145)
Total Bilirubin: 0.7 mg/dL (ref 0.3–1.2)
Total Protein: 6.6 g/dL (ref 6.5–8.1)

## 2016-07-23 LAB — BRAIN NATRIURETIC PEPTIDE: B Natriuretic Peptide: 134.3 pg/mL — ABNORMAL HIGH (ref 0.0–100.0)

## 2016-07-23 LAB — I-STAT TROPONIN, ED: TROPONIN I, POC: 0 ng/mL (ref 0.00–0.08)

## 2016-07-23 MED ORDER — HYDROCODONE-ACETAMINOPHEN 5-325 MG PO TABS
1.0000 | ORAL_TABLET | Freq: Four times a day (QID) | ORAL | Status: DC | PRN
  Administered 2016-07-24 – 2016-07-27 (×9): 1 via ORAL
  Filled 2016-07-23 (×9): qty 1

## 2016-07-23 MED ORDER — HEPARIN (PORCINE) IN NACL 100-0.45 UNIT/ML-% IJ SOLN
1100.0000 [IU]/h | INTRAMUSCULAR | Status: DC
  Administered 2016-07-23: 1000 [IU]/h via INTRAVENOUS
  Filled 2016-07-23 (×3): qty 250

## 2016-07-23 MED ORDER — OXYCODONE-ACETAMINOPHEN 5-325 MG PO TABS
1.0000 | ORAL_TABLET | Freq: Once | ORAL | Status: DC
  Filled 2016-07-23: qty 1

## 2016-07-23 MED ORDER — ALBUTEROL SULFATE (2.5 MG/3ML) 0.083% IN NEBU
5.0000 mg | INHALATION_SOLUTION | Freq: Once | RESPIRATORY_TRACT | Status: AC
  Administered 2016-07-23: 5 mg via RESPIRATORY_TRACT
  Filled 2016-07-23: qty 6

## 2016-07-23 MED ORDER — HEPARIN BOLUS VIA INFUSION
4000.0000 [IU] | Freq: Once | INTRAVENOUS | Status: AC
  Administered 2016-07-23: 4000 [IU] via INTRAVENOUS
  Filled 2016-07-23: qty 4000

## 2016-07-23 MED ORDER — LORAZEPAM 1 MG PO TABS: 1.0000 mg | ORAL_TABLET | Freq: Four times a day (QID) | ORAL | Status: DC | PRN

## 2016-07-23 MED ORDER — IRBESARTAN 300 MG PO TABS
150.0000 mg | ORAL_TABLET | Freq: Every day | ORAL | Status: DC
  Administered 2016-07-24 – 2016-07-25 (×2): 150 mg via ORAL
  Filled 2016-07-23 (×2): qty 1

## 2016-07-23 MED ORDER — DILTIAZEM HCL 100 MG IV SOLR
5.0000 mg/h | INTRAVENOUS | Status: DC
  Administered 2016-07-24: 5 mg/h via INTRAVENOUS
  Administered 2016-07-25: 10 mg/h via INTRAVENOUS
  Filled 2016-07-23 (×3): qty 100

## 2016-07-23 MED ORDER — FUROSEMIDE 10 MG/ML IJ SOLN
20.0000 mg | Freq: Every day | INTRAMUSCULAR | Status: DC
  Administered 2016-07-23 – 2016-07-25 (×3): 20 mg via INTRAVENOUS
  Filled 2016-07-23 (×3): qty 2

## 2016-07-23 MED ORDER — IPRATROPIUM-ALBUTEROL 0.5-2.5 (3) MG/3ML IN SOLN
3.0000 mL | Freq: Two times a day (BID) | RESPIRATORY_TRACT | Status: DC
  Administered 2016-07-23 – 2016-07-27 (×8): 3 mL via RESPIRATORY_TRACT
  Filled 2016-07-23 (×9): qty 3

## 2016-07-23 MED ORDER — SIMVASTATIN 20 MG PO TABS
20.0000 mg | ORAL_TABLET | Freq: Every day | ORAL | Status: DC
  Administered 2016-07-23 – 2016-07-25 (×3): 20 mg via ORAL
  Filled 2016-07-23 (×3): qty 1

## 2016-07-23 MED ORDER — HYDROCODONE-ACETAMINOPHEN 5-325 MG PO TABS
1.0000 | ORAL_TABLET | Freq: Once | ORAL | Status: AC
  Administered 2016-07-23: 1 via ORAL
  Filled 2016-07-23: qty 1

## 2016-07-23 MED ORDER — LATANOPROST 0.005 % OP SOLN
1.0000 [drp] | Freq: Every day | OPHTHALMIC | Status: DC
  Administered 2016-07-24 – 2016-07-27 (×5): 1 [drp] via OPHTHALMIC
  Filled 2016-07-23: qty 2.5

## 2016-07-23 MED ORDER — ONDANSETRON HCL 4 MG/2ML IJ SOLN: 4.0000 mg | Freq: Four times a day (QID) | INTRAMUSCULAR | Status: DC | PRN

## 2016-07-23 MED ORDER — PREDNISONE 20 MG PO TABS
40.0000 mg | ORAL_TABLET | Freq: Every day | ORAL | Status: DC
  Administered 2016-07-24: 40 mg via ORAL
  Filled 2016-07-23: qty 2

## 2016-07-23 MED ORDER — METHYLPREDNISOLONE SODIUM SUCC 125 MG IJ SOLR
125.0000 mg | Freq: Once | INTRAMUSCULAR | Status: AC
  Administered 2016-07-23: 125 mg via INTRAVENOUS
  Filled 2016-07-23: qty 2

## 2016-07-23 MED ORDER — NEBIVOLOL HCL 10 MG PO TABS: 10.0000 mg | ORAL_TABLET | Freq: Every day | ORAL | 0 refills | Status: DC

## 2016-07-23 MED ORDER — ALBUTEROL SULFATE (2.5 MG/3ML) 0.083% IN NEBU: 5.0000 mg | INHALATION_SOLUTION | RESPIRATORY_TRACT | Status: DC | PRN

## 2016-07-23 MED ORDER — TIMOLOL MALEATE 0.5 % OP SOLN
1.0000 [drp] | Freq: Two times a day (BID) | OPHTHALMIC | Status: DC
  Administered 2016-07-24 – 2016-07-27 (×6): 1 [drp] via OPHTHALMIC
  Filled 2016-07-23: qty 5

## 2016-07-23 MED ORDER — DILTIAZEM LOAD VIA INFUSION
15.0000 mg | Freq: Once | INTRAVENOUS | Status: DC
  Filled 2016-07-23: qty 15

## 2016-07-23 NOTE — Consult Note (Addendum)
CARDIOLOGY CONSULT NOTE      Patient ID: Toni HoneyJudy A Dangler MRN: [id number] DOB/AGE: [birth year] 66 y.o.  Admit date: 07/23/2016 Referring PhysicianRobert Jeraldine LootsLockwood, MD Primary Salena SanerPhysicianHARRIS, WILLIAM, MD Primary Cardiologist new Reason for Consultation Atrial fibrillation  HPI: 65 year old woman with a history of tobacco abuse. She quit smoking several years ago. She carries a diagnosis of emphysema although most recent pulmonary function test mention restrictive disease. She was seeing her pulmonologist for dyspnea on exertion. An irregular heartbeat was noted today. ECG was done showing atrial fibrillation. We are now asked to see her for further evaluation.  She does not feel any palpitations. She continues to be short of breath. Chest x-ray showed a small pleural effusion. She had a prior echocardiogram in June showing evidence of diastolic dysfunction. She denies any chest pain. Her exertion is limited by shortness of breath.  Review of systems complete and found to be negative unless listed above   Past Medical History:  Diagnosis Date  . Emphysema of lung (HCC)   . Hypercholesterolemia   . Hypertension   . Osteoarthritis     Family History  Problem Relation Age of Onset  . Stroke Neg Hx     Social History   Social History  . Marital status: Widowed    Spouse name: N/A  . Number of children: N/A  . Years of education: N/A   Occupational History  . Not on file.   Social History Main Topics  . Smoking status: Former Smoker    Packs/day: 1.00    Years: 43.00    Types: Cigarettes    Start date: 08/26/1966    Quit date: 08/26/2010  . Smokeless tobacco: Never Used  . Alcohol use No  . Drug use: No  . Sexual activity: Not on file   Other Topics Concern  . Not on file   Social History Narrative  . No narrative on file    Past Surgical History:  Procedure Laterality Date  . CATARACT EXTRACTION Bilateral   . LEG SURGERY Right       (Not in a hospital  admission)  Physical Exam: Vitals:   Vitals:   07/23/16 1545 07/23/16 1600 07/23/16 1645 07/23/16 1715  BP:   (!) 141/104 140/90  Pulse: 108 99 97 106  Resp: 26 23 (!) 30 22  Temp:   97.9 F (36.6 C)   TempSrc:   Oral   SpO2: 93% 93% 95% 96%  Weight:       I&O's:  No intake or output data in the 24 hours ending 07/23/16 1827 Physical exam:  [place]/AT EOMI No JVD, No carotid bruit Irregularly irregualr S1S2  No wheezing Soft. NT, nondistended No edema. No focal motor or sensory deficits Normal affect  Labs:   Lab Results  Component Value Date   WBC 8.2 07/23/2016   HGB 16.5 (H) 07/23/2016   HCT 49.9 (H) 07/23/2016   MCV 93.8 07/23/2016   PLT 151 07/23/2016    Recent Labs Lab 07/23/16 1416  NA 141  K 4.1  CL 108  CO2 26  BUN 18  CREATININE 0.98  CALCIUM 9.8  PROT 6.6  BILITOT 0.7  ALKPHOS 72  ALT 19  AST 25  GLUCOSE 132*   No results found for: CKTOTAL, CKMB, CKMBINDEX, TROPONINI No results found for: CHOL No results found for: HDL No results found for: LDLCALC No results found for: TRIG No results found for: CHOLHDL No results found for: LDLDIRECT    Radiology: small pleural  effusion EKG: AFib, RVR  ASSESSMENT AND PLAN:  Active Problems:   COPD exacerbation (HCC)  Diastolic dysfunction Atrial fibrillation  1) rate control with IV diltiazem. Start IV heparin for stroke prevention. Nothing by mouth past midnight consider cardioversion. Would need TEE guidance. She is willing to start Eliquis postprocedure.  2) we'll give 1 dose of IV Lasix. She has some fluid overload likely from the acute on chronic diastolic failure from loss of atrial kick.  She will need a stepdown bed due to the IV diltiazem titration.  Transthoracic Echocardiogram done in June 2017. We do not need to repeat at this time.  This patients CHA2DS2-VASc Score and unadjusted Ischemic Stroke Rate (% per year) is equal to 3.2 % stroke rate/year from a score of 3  Above score  calculated as 1 point each if present [CHF, HTN, DM, Vascular=MI/PAD/Aortic Plaque, Age if 65-74, or Female] Above score calculated as 2 points each if present [Age > 75, or Stroke/TIA/TE]   Signed:   Fredric Mare, MD, [facility] 07/23/2016, 6:27 PM

## 2016-07-23 NOTE — Patient Instructions (Addendum)
Start bystolic 10 mg now    Please remember to go to the  x-ray department downstairs for your tests - we will call you with the results when they are available.  Go to [place] and I will let them know you are on your way

## 2016-07-23 NOTE — Progress Notes (Signed)
ANTICOAGULATION CONSULT NOTE - Initial Consult  Pharmacy Consult for heparin Indication: atrial fibrillation  Allergies  Allergen Reactions  . Percocet [Oxycodone-Acetaminophen] Itching    Patient Measurements: Weight: 205 lb (93 kg) Heparin Dosing Weight: 67.6kg  Vital Signs: Temp: 97.9 F (36.6 C) (11/28 1645) Temp Source: Oral (11/28 1645) BP: 140/90 (11/28 1715) Pulse Rate: 106 (11/28 1715)  Labs:  Recent Labs  07/23/16 1416  HGB 16.5*  HCT 49.9*  PLT 151  CREATININE 0.98    Estimated Creatinine Clearance: 57.5 mL/min (by C-G formula based on SCr of 0.98 mg/dL).   Medical History: Past Medical History:  Diagnosis Date  . Emphysema of lung (HCC)   . Hypercholesterolemia   . Hypertension   . Osteoarthritis     Medications:  Infusions:  . diltiazem (CARDIZEM) infusion    . heparin      Assessment: 46 yof presented to the ED with SOB. Found to be in afib and now starting IV heparin. Baseline H/H is elevated and platelets are WNL. She is not on anticoagulation PTA.   Goal of Therapy:  Heparin level 0.3-0.7 units/ml Monitor platelets by anticoagulation protocol: Yes   Plan:  - Heparin bolus 4000 units IV x 1 - Heparin gtt 1000 units/hr - Check a 6 hr heparin level - Daily heparin level and CBC  [name], [name] 07/23/2016,6:40 PM

## 2016-07-23 NOTE — Assessment & Plan Note (Signed)
Trial off acei due to sob 06/25/2016 > cough better   Not Adequate control on present rx, reviewed in detail with pt > probably should not have stopped tenormin but likely needs BB due to afib / likely diastolic dysfunction > to ER to sort out

## 2016-07-23 NOTE — Addendum Note (Signed)
Addended by: Christen Butter on: 07/23/2016 01:43 PM   Modules accepted: Orders

## 2016-07-23 NOTE — ED Triage Notes (Signed)
Patient here with increasing shortness of breath x 1 month. Denies CP but told to come to ED for possible atrial fib, non-smoker. Patient with dyspnea and decreased oxygen sats on arrival. Alert and oriented

## 2016-07-23 NOTE — Progress Notes (Signed)
Subjective:     Patient ID: [name], female   DOB: 1950/11/01,    MRN: [id number]    Brief patient profile:  67 yowf quit smoking 2012 due to some cough/wheezing improved p quit at wt 180 and no obst on spirometry 06/25/16  referred to pulmonary clinic 06/25/2016 by Dr   [name] s/p admit:  Admit date: 02/05/2016 Discharge date: 02/08/2016  Discharge Diagnoses:  Principal Problem:   Acute respiratory failure with hypoxia (HCC) Active Problems:   HTN (hypertension)   PNA (pneumonia)   Glaucoma   Discharge Condition: Stable  Diet recommendation: Heart Healthy      Filed Weights   02/05/16 1723 02/06/16 0123  Weight: 93.895 kg (207 lb) 92.8 kg (204 lb 9.4 oz)    History of present illness:  [name] is a 65 y.o. female with medical history significant for hypertension, cholesterol high and glaucoma presents to the emergency room with chief complaint of respiratory distress. Patient states that she has been short of breath for the last month. She went to an urgent care with this complaint and was given antibiotics. She states the antibiotics did not help her in the long-term. She was briefly better and then got worse again. Patient went to go get seen today for further evaluation and was found to be hypoxic into the 80s. She was sent over to the emergency room.  Hospital Course:  [name] is a 65 y.o. Female with a PMH of HTN, HLD,Former smoker who presented to ED with 1 month history of shortness of breath and cough (failed outpatient antibiotic). She was found to have pneumonia on x-ray chest, and admitted to the hospital for treatment of pneumonia and COPD with exacerbation.   Acute respiratory failure with hypoxia (HCC):  -Likely secondary to pneumonia/COPD above.  -She was treated with steroids, bronchodilators and empiric antibiotics.  -She significantly improved during this hospitalization -Discharged on Levaquin   COPD with  exacerbation:  -Long-standing history of tobacco abuse (more than 20 years)-she quit 5 years back.  -She was treated with IV steroids -Will discharge on Prednisone taper  Mildly decompensated diastolic heart failure:  -Echo performed on 02/06/2016 showing mild diastolic dysfunction -Discharge on Lasix 20 mg by mouth daily   History of Present Illness  06/25/2016 1st [facility] office visit/ [name]   Chief Complaint  Patient presents with  . Consult visit    COPD consult per [name]- Pt states SOB w/ excertion. No congestion or tightness. Occasional coughing at times.   onset of sob spring 2017 x one month prior to admit on 02/05/16 gradually worse while on lisinopril  In April 2017 was using [facility] parking but still able to do [name]  Now sob across the room  Sob occurs at rest if speaking  Able to lie down rotated on to left side otherwise chokes/ smothers rec Stop lotensin and start ibesartan 150 mg one daily  And your breathing should gradually improve  Please schedule a follow up office visit in 2 weeks, sooner if needed to see [name] to recheck your blood pressure  Add double the lasix dose for now    07/23/2016  f/u ov/[name] re: cough/ pseuowheeze ? Ace ? Cardiac asthma / stopped tenormin "ran out"  Chief Complaint  Patient presents with  . Follow-up    Breathing is unchanged since the last visit. She is coughing less. She is using proair 4 x daily on average.   used  one puff of proair so far today Sleeps on one fat pillow ok    No obvious day to day or daytime variability or assoc excess/ purulent sputum or mucus plugs or hemoptysis or cp or chest tightness, subjective wheeze or overt sinus or hb symptoms. No unusual exp hx or h/o childhood pna/ asthma or knowledge of premature birth.  Sleeping ok without nocturnal  or early am exacerbation  of respiratory  c/o's or need for noct saba. Also denies any obvious fluctuation of symptoms with weather or environmental  changes or other aggravating or alleviating factors except as outlined above   Current Medications, Allergies, Complete Past Medical History, Past Surgical History, Family History, and Social History were reviewed in [facility] electronic medical record.  ROS  The following are not active complaints unless bolded sore throat, dysphagia, dental problems, itching, sneezing,  nasal congestion or excess/ purulent secretions, ear ache,   fever, chills, sweats, unintended wt loss, classically pleuritic or exertional cp,  orthopnea pnd or leg swelling improved, presyncope, palpitations, abdominal pain, anorexia, nausea, vomiting, diarrhea  or change in bowel or bladder habits, change in stools or urine, dysuria,hematuria,  rash, arthralgias, visual complaints, headache, numbness, weakness or ataxia or problems with walking or coordination,  change in mood/affect or memory.              Objective:   Physical Exam    Obese wf nad / sob chair to table/ easily confused about details of care/ med names     07/23/2016    06/25/16 204 lb 1.6 oz (92.6 kg)  02/06/16 204 lb 9.4 oz (92.8 kg)  02/25/08 163 lb (73.9 kg)    Vital signs reviewed  -  - Note on arrival 02 sats  92% on RA - BP 152/90     HEENT: nl dentition, turbinates, and oropharynx. Nl external ear canals without cough reflex   NECK :  without JVD/Nodes/TM/ nl carotid upstrokes bilaterally   LUNGS: no acc muscle use,  Nl contour chest which is clear to A and P bilaterally without cough on insp or exp maneuvers   CV: IRIR apical rate around 125-130   no s3 or murmur or increase in P2,   pedal edema resolved   ABD:  Tensely obese soft and nontender with nl inspiratory excursion in the supine position. No bruits or organomegaly, bowel sounds nl  MS:  Nl gait/ ext warm without deformities, calf tenderness, cyanosis or clubbing No obvious joint restrictions   SKIN: warm and dry without lesions    NEURO:  alert, approp, nl  sensorium with  no motor deficits   EKG 07/23/2016  Afib around 120    CXR PA and Lateral:   07/23/2016 :    I personally reviewed images and my impression as follows:    cardiomegaly/ scoliosis, new small L effusion           Assessment:

## 2016-07-23 NOTE — Progress Notes (Signed)
ANTICOAGULATION CONSULT NOTE   Pharmacy Consult for heparin vs lovenox Indication: atrial fibrillation   Pharmacy received a consult for heparin per pharmacy (from Dr. Nechama Guard, cards) and within minutes received a consult for Lovenox per pharmacy (from Dr. Jarvis Newcomer with [facility]).  Paged NP on-call for [facility] x 2 with no return of call to clarify.  Will continue with heparin which was already dosed in the ED and discontinue Lovenox consult.  [employer], Pharm.D., BCPS Clinical Pharmacist Pager [phone] 07/23/2016 8:25 PM

## 2016-07-23 NOTE — ED Provider Notes (Signed)
[facility]-EMERGENCY DEPT Provider Note   CSN: [id number] Arrival date & time: 07/23/16  1406     History   Chief Complaint Chief Complaint  Patient presents with  . Shortness of Breath    HPI [name] is a 65 y.o. female.  HPI  Patient presents with dyspnea, fatigue, cough. Patient denies history of COPD, Afib, cardiac or pulmonary problems. Symptoms began possibly several months ago, seemingly have become worse over the past few days, though she is a very poor historian, and the progression of the last few days seems to be the most prominent change in her condition, though this is not entirely clear. She does specifically deny chest pain, headache, fever.   Past Medical History:  Diagnosis Date  . Emphysema of lung (HCC)   . Hypercholesterolemia   . Hypertension   . Osteoarthritis     Patient Active Problem List   Diagnosis Date Noted  . Atrial fibrillation with rapid ventricular response (HCC) 07/23/2016  . Morbid obesity due to excess calories (HCC) 06/26/2016  . Dyspnea 06/25/2016  . Community acquired pneumonia   . Acute on chronic diastolic heart failure (HCC)   . PNA (pneumonia) 02/06/2016  . Glaucoma 02/06/2016  . Acute respiratory failure with hypoxia (HCC) 02/05/2016  . HTN (hypertension) 02/05/2016  . HYPERTHYROIDISM 02/25/2008  . DEPRESSION, INITIAL EPISODE 02/25/2008  . Essential hypertension 02/24/2008  . BACK PAIN 02/24/2008  . OSTEOPENIA 02/24/2008  . SCOLIOSIS 02/24/2008    Past Surgical History:  Procedure Laterality Date  . CATARACT EXTRACTION Bilateral   . LEG SURGERY Right     OB History    No data available       Home Medications    Prior to Admission medications   Medication Sig Start Date End Date Taking? Authorizing Provider  furosemide (LASIX) 20 MG tablet Take 1 tablet (20 mg total) by mouth daily. 02/08/16  Yes Jeralyn BennettEzequiel Zamora, MD  HYDROcodone-acetaminophen (NORCO/VICODIN) 5-325 MG tablet TK 1 T PO BID PRN FOR PAIN  11/30/15  Yes Historical Provider, MD  irbesartan (AVAPRO) 150 MG tablet Take 1 tablet (150 mg total) by mouth daily. 06/25/16  Yes Nyoka CowdenMichael B Wert, MD  latanoprost (XALATAN) 0.005 % ophthalmic solution Place 1 drop into both eyes at bedtime.   Yes Historical Provider, MD  LORazepam (ATIVAN) 1 MG tablet TAKE 1 TABLET BY MOUTH TID PRN ANXIETY 01/05/16  Yes Historical Provider, MD  Melatonin 5 MG TABS Take 1 tablet by mouth at bedtime as needed.   Yes Historical Provider, MD  Multiple Vitamin (MULTIVITAMIN) capsule Take 1 capsule by mouth daily.   Yes Historical Provider, MD  nebivolol (BYSTOLIC) 10 MG tablet Take 1 tablet (10 mg total) by mouth daily. 07/23/16  Yes Nyoka CowdenMichael B Wert, MD  PROAIR HFA 108 [phone](90 Base) MCG/ACT inhaler INHALE 2 PUFFS INTO THE LUNGS Q 4 HOURS PRN 01/05/16  Yes Historical Provider, MD  simvastatin (ZOCOR) 20 MG tablet Take 1 tablet by mouth daily. 06/07/16  Yes Historical Provider, MD  timolol (TIMOPTIC) 0.5 % ophthalmic solution INSTILL 1 DROP IN BOTH EYES BID 11/28/15  Yes Historical Provider, MD  guaiFENesin (MUCINEX) 600 MG 12 hr tablet Take 1 tablet (600 mg total) by mouth 2 (two) times daily. Patient not taking: Reported on 07/23/2016 02/08/16   Jeralyn BennettEzequiel Zamora, MD    Family History Family History  Problem Relation Age of Onset  . Stroke Neg Hx     Social History Social History  Substance Use Topics  . Smoking  status: Former Smoker    Packs/day: 1.00    Years: 43.00    Types: Cigarettes    Start date: 08/26/1966    Quit date: 08/26/2010  . Smokeless tobacco: Never Used  . Alcohol use No     Allergies   Percocet [oxycodone-acetaminophen]   Review of Systems Review of Systems  Constitutional:       Per HPI, otherwise negative  HENT:       Per HPI, otherwise negative  Respiratory:       Per HPI, otherwise negative  Cardiovascular:       Per HPI, otherwise negative  Gastrointestinal: Negative for vomiting.  Endocrine:       Negative aside from HPI    Genitourinary:       Neg aside from HPI   Musculoskeletal:       Per HPI, otherwise negative  Skin: Negative.   Neurological: Negative for syncope.     Physical Exam Updated Vital Signs BP 149/93   Pulse 115   Temp 97.9 F (36.6 C) (Oral)   Resp (!) 29   Wt 205 lb (93 kg)   SpO2 96%   BMI 40.04 kg/m   Physical Exam  Constitutional: She is oriented to person, place, and time. She appears well-developed and well-nourished. No distress.  HENT:  Head: Normocephalic and atraumatic.  Eyes: Conjunctivae and EOM are normal.  Cardiovascular: Normal rate and regular rhythm.   Pulmonary/Chest: Effort normal and breath sounds normal. No stridor. No respiratory distress.  Abdominal: She exhibits no distension.  Musculoskeletal: She exhibits no edema.  Neurological: She is alert and oriented to person, place, and time. No cranial nerve deficit.  Skin: Skin is warm and dry.  Psychiatric: She has a normal mood and affect.  Nursing note and vitals reviewed.    ED Treatments / Results  Labs (all labs ordered are listed, but only abnormal results are displayed) Labs Reviewed  CBC WITH DIFFERENTIAL/PLATELET - Abnormal; Notable for the following:       Result Value   RBC 5.32 (*)    Hemoglobin 16.5 (*)    HCT 49.9 (*)    All other components within normal limits  COMPREHENSIVE METABOLIC PANEL - Abnormal; Notable for the following:    Glucose, Bld 132 (*)    GFR calc non Af Amer 59 (*)    All other components within normal limits  BRAIN NATRIURETIC PEPTIDE - Abnormal; Notable for the following:    B Natriuretic Peptide 134.3 (*)    All other components within normal limits  URINALYSIS, ROUTINE W REFLEX MICROSCOPIC (NOT AT [facility])  I-STAT TROPOININ, ED    EKG  EKG Interpretation  Date/Time:  Tuesday July 23 2016 14:16:08 EST Ventricular Rate:  121 PR Interval:    QRS Duration: 72 QT Interval:  310 QTC Calculation: 440 R Axis:   0 Text Interpretation:  Atrial  fibrillation with rapid ventricular response with premature ventricular or aberrantly conducted complexes Low voltage QRS Artifact Abnormal ekg Confirmed by Gerhard Munch  MD [phone]) on 07/23/2016 3:21:49 PM     On cardiac monitor the patient has variable rate 95/121, A. fib, abnormal Again she denies any history of atrial fibrillation. Patient requires nasal cannula to maintain oxygen saturation greater than 95%.   Radiology Dg Chest 2 View  Result Date: 07/23/2016 CLINICAL DATA:  One month of dyspnea. History of emphysema, former smoker, CHF. EXAM: CHEST  2 VIEW COMPARISON:  PA and lateral chest x-ray of June 25, 2016  FINDINGS: The lungs remain hyperinflated. The interstitial markings remain increased diffusely. There is no alveolar infiltrate. The heart is top-normal in size. The pulmonary vascularity is not engorged. There is calcification in the wall of the aortic arch. The observed bony thorax exhibits no acute abnormality. There is multilevel degenerative disc disease of the thoracic spine. IMPRESSION: Stable chronic bronchitic changes with probable superimposed low-grade CHF. No alveolar pneumonia. Thoracic aortic atherosclerosis. Electronically Signed   By: David  [place] M.D.   On: 07/23/2016 13:39    Procedures Procedures (including critical care time)  Medications Ordered in ED Medications - No data to display   Initial Impression / Assessment and Plan / ED Course  I have reviewed the triage vital signs and the nursing notes.  Pertinent labs & imaging results that were available during my care of the patient were reviewed by me and considered in my medical decision making (see chart for details).  Clinical Course     On repeat exam the patient remained in similar condition, awake and alert. She now notes that she was at her pulmonologist's office earlier today, and was referred here for evaluation given concern for A. fib, hypoxia. She still is not clear on her  pulmonary dysfunction diagnosis.  Patient with history of COPD, former smoker, presents with fatigue, cough, dyspnea. Patient is found to be hypoxic on room air with oxygen saturation in the mid 80% range. This improves with supplemental oxygen. However, the patient also found to have atrial fibrillation, which is a new arrhythmia for her. Patient has no chest pain, no evidence for ongoing coronary ischemia, no hemodynamic instability. Patient received steroids, breathing treatments, supplemental oxygen, was admitted for further evaluation and management. Final Clinical Impressions(s) / ED Diagnoses   Final diagnoses:  COPD exacerbation (HCC)  Atrial fibrillation, unspecified type (HCC)      Gerhard Munch, MD 07/23/16 1650

## 2016-07-23 NOTE — Assessment & Plan Note (Signed)
Spirometry 06/25/2016  Purely restrictive with FVC  43% , no obst on f/v loop at all  Trial off acei 06/25/2016 > cough better 07/23/2016 but RAF > to ER

## 2016-07-23 NOTE — ED Notes (Signed)
Cardiologist at bedside.  

## 2016-07-23 NOTE — Assessment & Plan Note (Signed)
New onset 07/23/2016 > to ER

## 2016-07-23 NOTE — H&P (Signed)
History and Physical   [name] WFU:[id number] DOB: February 04, 1951 DOA: 07/23/2016  Referring MD/NP/PA: Dr. Jeraldine Loots, EDP PCP: [name], MD Outpatient Specialists: Pulmonology, Dr. Sherene Sires  Patient coming from: Home by way of pulmonology office  Chief Complaint: Dyspnea, AFib  HPI: [name] is a 65 y.o. female with a history of HTN, HLD, long-term tobacco abuse, stopped 5 years ago, and glaucoma sent from pulmonology office for hypoxemia. She was admitted for CAP/COPD exacerbation in June and returned to baseline without oxygen requirement following this, but reports 1 month of worsening shortness of breath with exertion that comes and goes, associated with cough productive of white sputum. She's tried mucinex with some improvement, taking albuterol MDI incorrectly per pulm, and essentially doesn't ambulate outside the home. She bought a pulse oximeter and notes SpO2 dropping into 70%'s with ambulation, and has noted HR's > 110 consistently during this time. No palpitations or chest pain. Due to continued dyspnea, she presented to Dr. Thurston Hole office today and was noted to be in AFib and hypoxemic. ECG confirmed AFib and nebivolol x1 was given prior to transfer to ED. On arrival she was in no distress, lungs were clear but SpO2 was noted to be in 80's increasing to upper 90%'s on 2L O2. AFib with RVR w/vent rate in 120's on ECG. Patient received steroids, breathing treatments, supplemental oxygen, was admitted for further evaluation and management.    Review of Systems: No fevers, and per HPI. All others reviewed and are negative.   Past Medical History:  Diagnosis Date  . Emphysema of lung (HCC)   . Hypercholesterolemia   . Hypertension   . Osteoarthritis     Past Surgical History:  Procedure Laterality Date  . CATARACT EXTRACTION Bilateral   . LEG SURGERY Right    - ~40-45 pack-year history of cigarettes stopped 5 years ago, no EtOH, no drugs. Has family nearby.    Allergies  Allergen Reactions  . Percocet [Oxycodone-Acetaminophen] Itching    Family History  Problem Relation Age of Onset  . Stroke Neg Hx    - Family history + for heart disease any father, otherwise reviewed and not pertinent.  Prior to Admission medications   Medication Sig Start Date End Date Taking? Authorizing Provider  furosemide (LASIX) 20 MG tablet Take 1 tablet (20 mg total) by mouth daily. 02/08/16  Yes Jeralyn Bennett, MD  HYDROcodone-acetaminophen (NORCO/VICODIN) 5-325 MG tablet TK 1 T PO BID PRN FOR PAIN 11/30/15  Yes Historical Provider, MD  irbesartan (AVAPRO) 150 MG tablet Take 1 tablet (150 mg total) by mouth daily. 06/25/16  Yes Nyoka Cowden, MD  latanoprost (XALATAN) 0.005 % ophthalmic solution Place 1 drop into both eyes at bedtime.   Yes Historical Provider, MD  LORazepam (ATIVAN) 1 MG tablet TAKE 1 TABLET BY MOUTH TID PRN ANXIETY 01/05/16  Yes Historical Provider, MD  Melatonin 5 MG TABS Take 1 tablet by mouth at bedtime as needed.   Yes Historical Provider, MD  Multiple Vitamin (MULTIVITAMIN) capsule Take 1 capsule by mouth daily.   Yes Historical Provider, MD  nebivolol (BYSTOLIC) 10 MG tablet Take 1 tablet (10 mg total) by mouth daily. 07/23/16  Yes Nyoka Cowden, MD  PROAIR HFA 108 [phone] Base) MCG/ACT inhaler INHALE 2 PUFFS INTO THE LUNGS Q 4 HOURS PRN 01/05/16  Yes Historical Provider, MD  simvastatin (ZOCOR) 20 MG tablet Take 1 tablet by mouth daily. 06/07/16  Yes Historical Provider, MD  timolol (TIMOPTIC) 0.5 % ophthalmic solution INSTILL  1 DROP IN BOTH EYES BID 11/28/15  Yes Historical Provider, MD  guaiFENesin (MUCINEX) 600 MG 12 hr tablet Take 1 tablet (600 mg total) by mouth 2 (two) times daily. Patient not taking: Reported on 07/23/2016 02/08/16   Jeralyn Bennett, MD    Physical Exam: Vitals:   07/23/16 1545 07/23/16 1600 07/23/16 1645 07/23/16 1715  BP:   (!) 141/104 140/90  Pulse: 108 99 97 106  Resp: 26 23 (!) 30 22  Temp:   97.9 F (36.6 C)    TempSrc:   Oral   SpO2: 93% 93% 95% 96%  Weight:       Constitutional: 65 y.o. female in no distress, calm demeanor Eyes: Lids and conjunctivae normal, PERRL ENMT: Mucous membranes are moist. Posterior pharynx clear of any exudate or lesions. Fair dentition.  Neck: normal, supple, no masses, no thyromegaly Respiratory: Tachypneic speaking in short sentences without accessory muscle use. Diminished air entry bilaterally. Cardiovascular: Irreg irreg rate 105 - 125 on monitor. No murmurs, rubs, or gallops. No carotid bruits. No JVD. No LE edema. 2+ pedal pulses. Abdomen: Normoactive bowel sounds. Soft, obese, no tenderness, non-distended, and no masses palpated. No hepatosplenomegaly. GU: No indwelling catheter Musculoskeletal: No clubbing / cyanosis. No joint deformity upper and lower extremities. Good ROM, no contractures. Normal muscle tone.  Skin: Warm, dry. No rashes, wounds, no ulcers. No significant lesions noted.  Neurologic: CN II-XII grossly intact. Gait normal. Speech normal. No focal deficits in motor strength or sensation in all extremities. DTRs 2+.  Psychiatric: Alert and oriented x3. Normal judgment and insight. Mood euthymic with anxiety with congruent affect.   Labs on Admission: I have personally reviewed following labs and imaging studies  CBC:  Recent Labs Lab 07/23/16 1416  WBC 8.2  NEUTROABS 5.7  HGB 16.5*  HCT 49.9*  MCV 93.8  PLT 151   Basic Metabolic Panel:  Recent Labs Lab 07/23/16 1416  NA 141  K 4.1  CL 108  CO2 26  GLUCOSE 132*  BUN 18  CREATININE 0.98  CALCIUM 9.8   GFR: Estimated Creatinine Clearance: 57.5 mL/min (by C-G formula based on SCr of 0.98 mg/dL). Liver Function Tests:  Recent Labs Lab 07/23/16 1416  AST 25  ALT 19  ALKPHOS 72  BILITOT 0.7  PROT 6.6  ALBUMIN 3.8   BNP (last 3 results)  Recent Labs  06/25/16 1308  PROBNP 141.0*   Urine analysis:    Component Value Date/Time   COLORURINE YELLOW 07/23/2016 1641    APPEARANCEUR CLEAR 07/23/2016 1641   LABSPEC 1.021 07/23/2016 1641   PHURINE 6.0 07/23/2016 1641   GLUCOSEU NEGATIVE 07/23/2016 1641   HGBUR NEGATIVE 07/23/2016 1641   BILIRUBINUR NEGATIVE 07/23/2016 1641   KETONESUR NEGATIVE 07/23/2016 1641   PROTEINUR 30 (A) 07/23/2016 1641   NITRITE NEGATIVE 07/23/2016 1641   LEUKOCYTESUR MODERATE (A) 07/23/2016 1641   Radiological Exams on Admission: Dg Chest 2 View  Result Date: 07/23/2016 CLINICAL DATA:  One month of dyspnea. History of emphysema, former smoker, CHF. EXAM: CHEST  2 VIEW COMPARISON:  PA and lateral chest x-ray of June 25, 2016 FINDINGS: The lungs remain hyperinflated. The interstitial markings remain increased diffusely. There is no alveolar infiltrate. The heart is top-normal in size. The pulmonary vascularity is not engorged. There is calcification in the wall of the aortic arch. The observed bony thorax exhibits no acute abnormality. There is multilevel degenerative disc disease of the thoracic spine. IMPRESSION: Stable chronic bronchitic changes with probable superimposed low-grade CHF.  No alveolar pneumonia. Thoracic aortic atherosclerosis. Electronically Signed   By: David  SwazilandJordan M.D.   On: 07/23/2016 13:39   EKG: Independently reviewed. AFib w/RVR vent rate 121. Low voltage but narrow QRS. WTc 440msec.  Assessment/Plan Principal Problem:   Acute respiratory failure with hypoxia (HCC) Active Problems:   HTN (hypertension)   Glaucoma   Acute on chronic diastolic heart failure (HCC)   Atrial fibrillation with rapid ventricular response (HCC)   COPD exacerbation (HCC)   Hypercholesterolemia   Acute hypoxemic respiratory failure: Multifactorial related to low-grade CHF, COPD exacerbation and AFib w/RVR. Also OHS/OSA (BMI 40) contributing.  - Given solumedrol, will continue prednisone x4 days, no abx indicated - Will need outpatient PFTs, consider PSG - Duonebs scheduled and albuterol neb prn.  - Check pulse oximetry  with vital signs. - Lasix 20mg  IV daily, daily weights, strict I/O  Atrial fibrillation with RVR: New diagnosis but probably ~1 months duration. Symptomatic with dyspnea but no palpitations. Echo 02/06/2016 showed EF 60-65%, normal wall motion, G1DD, mildly dilated R atrium and ventricle - likely due to pulmonary disease. BNP 134 on admission,  - Consult cardiology - Rate control: Given nebivolol at pulmonologist's office, currently < 110bpm. Consider cardioselective beta blocker vs. diltiazem w/preserved EF. Will await cardiology input.  - Anticoagulation: Start lovenox, should be candidate for DOAC. - CHA2DS2-VASc indicates 9.7 % stroke rate/year from a score of 6 due to dCHF (1), HTN (1), CAD/PAD (1) [thoracic aortic calcifications on CXR], Age > 65 (1), and sex (1). - HAS-BLED score: 2 - Maintain K > 4 and Mg > 2 - Last TSH: 1.53 Oct 2017.  Hypertension: Chronic, stable - Continue irbesartan (renal function at baseline)  Glaucoma: Chronic, stable Xalatan 1 drop both eyes daily at bedtime Timoptic one drop both eyes twice a day  Hyperlipidemia:  - Check lipid panel - Continue simvastatin  DVT prophylaxis: Lovenox for AFib  Code Status: Full confirmed at admission  Family Communication: Sister at bedside Disposition Plan: Admit for IV diuresis and management of atrial fibrillation with rapid ventricular response.  Consults called: Cardiology  Admission status: Inpatient    Hazeline Junkeryan Kamaron Deskins, MD [facility] Pager [phone]  If 7PM-7AM, please contact night-coverage [email or web address] Password [email or web address] 07/23/2016, 6:36 PM

## 2016-07-24 LAB — CBC
HCT: 51.1 % — ABNORMAL HIGH (ref 36.0–46.0)
HEMOGLOBIN: 16.9 g/dL — AB (ref 12.0–15.0)
MCH: 31.4 pg (ref 26.0–34.0)
MCHC: 33.1 g/dL (ref 30.0–36.0)
MCV: 94.8 fL (ref 78.0–100.0)
PLATELETS: 172 10*3/uL (ref 150–400)
RBC: 5.39 MIL/uL — ABNORMAL HIGH (ref 3.87–5.11)
RDW: 14.4 % (ref 11.5–15.5)
WBC: 7.8 10*3/uL (ref 4.0–10.5)

## 2016-07-24 LAB — LIPID PANEL
CHOLESTEROL: 205 mg/dL — AB (ref 0–200)
HDL: 64 mg/dL (ref >40)
LDL Cholesterol: 131 mg/dL — ABNORMAL HIGH (ref 0–99)
TRIGLYCERIDES: 50 mg/dL (ref <150)
Total CHOL/HDL Ratio: 3.2 RATIO
VLDL: 10 mg/dL (ref 0–40)

## 2016-07-24 LAB — BASIC METABOLIC PANEL
Anion gap: 10 (ref 5–15)
BUN: 23 mg/dL — AB (ref 6–20)
CALCIUM: 9.9 mg/dL (ref 8.9–10.3)
CHLORIDE: 102 mmol/L (ref 101–111)
CO2: 29 mmol/L (ref 22–32)
CREATININE: 1.13 mg/dL — AB (ref 0.44–1.00)
GFR, EST AFRICAN AMERICAN: 57 mL/min — AB (ref >60)
GFR, EST NON AFRICAN AMERICAN: 50 mL/min — AB (ref >60)
Glucose, Bld: 164 mg/dL — ABNORMAL HIGH (ref 65–99)
Potassium: 4.5 mmol/L (ref 3.5–5.1)
SODIUM: 141 mmol/L (ref 135–145)

## 2016-07-24 LAB — HEPARIN LEVEL (UNFRACTIONATED)
HEPARIN UNFRACTIONATED: 0.92 [IU]/mL — AB (ref 0.30–0.70)
HEPARIN UNFRACTIONATED: 0.53 [IU]/mL (ref 0.30–0.70)

## 2016-07-24 LAB — MAGNESIUM: Magnesium: 2 mg/dL (ref 1.7–2.4)

## 2016-07-24 MED ORDER — SODIUM CHLORIDE 0.9% FLUSH
3.0000 mL | Freq: Two times a day (BID) | INTRAVENOUS | Status: DC
  Administered 2016-07-24 – 2016-07-27 (×6): 3 mL via INTRAVENOUS

## 2016-07-24 MED ORDER — HEPARIN BOLUS VIA INFUSION
2000.0000 [IU] | Freq: Once | INTRAVENOUS | Status: AC
  Administered 2016-07-24: 2000 [IU] via INTRAVENOUS
  Filled 2016-07-24: qty 2000

## 2016-07-24 MED ORDER — SODIUM CHLORIDE 0.9 % IV SOLN: 250.0000 mL | INTRAVENOUS | Status: DC | PRN

## 2016-07-24 MED ORDER — SODIUM CHLORIDE 0.9% FLUSH: 3.0000 mL | INTRAVENOUS | Status: DC | PRN

## 2016-07-24 MED ORDER — SODIUM CHLORIDE 0.9% FLUSH
3.0000 mL | Freq: Two times a day (BID) | INTRAVENOUS | Status: DC
  Administered 2016-07-25: 3 mL via INTRAVENOUS

## 2016-07-24 MED ORDER — SODIUM CHLORIDE 0.9 % IV SOLN
250.0000 mL | INTRAVENOUS | Status: DC
  Administered 2016-07-24: 250 mL via INTRAVENOUS

## 2016-07-24 NOTE — Progress Notes (Signed)
ANTICOAGULATION CONSULT NOTE  Pharmacy Consult for heparin Indication: atrial fibrillation  Allergies  Allergen Reactions  . Percocet [Oxycodone-Acetaminophen] Itching    Patient Measurements: Height: 5' (152.4 cm) Weight: 203 lb 12.8 oz (92.4 kg) IBW/kg (Calculated) : 45.5 Heparin Dosing Weight: 67.6kg  Vital Signs: Temp: 97.7 F (36.5 C) (11/29 0040) Temp Source: Oral (11/29 0040) BP: 117/90 (11/29 0040) Pulse Rate: 100 (11/29 0040)  Labs:  Recent Labs  07/23/16 1416 07/24/16 0044  HGB 16.5* 16.9*  HCT 49.9* 51.1*  PLT 151 172  HEPARINUNFRC  --  <0.10*  CREATININE 0.98 1.13*    Estimated Creatinine Clearance: 49.7 mL/min (by C-G formula based on SCr of 1.13 mg/dL (H)).  Assessment: 65 y.o. female with Afib for heparin  Goal of Therapy:  Heparin level 0.3-0.7 units/ml Monitor platelets by anticoagulation protocol: Yes   Plan:  Heparin 2000 units IV bolus, then increase heparin 1300 units/hr Follow-up am labs.   [name], Gary Fleet 07/24/2016,1:52 AM

## 2016-07-24 NOTE — Progress Notes (Signed)
ANTICOAGULATION CONSULT NOTE  Pharmacy Consult for heparin Indication: atrial fibrillation  Allergies  Allergen Reactions  . Percocet [Oxycodone-Acetaminophen] Itching    Patient Measurements: Height: 5' (152.4 cm) Weight: 202 lb 8 oz (91.9 kg) IBW/kg (Calculated) : 45.5 Heparin Dosing Weight: 67.6kg  Vital Signs: Temp: 97.9 F (36.6 C) (11/29 0557) Temp Source: Oral (11/29 0557) BP: 125/93 (11/29 0557) Pulse Rate: 101 (11/29 0557)  Labs:  Recent Labs  07/23/16 1416 07/24/16 0044 07/24/16 0747  HGB 16.5* 16.9*  --   HCT 49.9* 51.1*  --   PLT 151 172  --   HEPARINUNFRC  --  <0.10* 0.92*  CREATININE 0.98 1.13*  --     Estimated Creatinine Clearance: 49.6 mL/min (by C-G formula based on SCr of 1.13 mg/dL (H)).   Medical History: Past Medical History:  Diagnosis Date  . Emphysema of lung (HCC)   . Hypercholesterolemia   . Hypertension   . Osteoarthritis     Medications:  Infusions:  . diltiazem (CARDIZEM) infusion    . heparin 1,300 Units/hr (07/24/16 0214)    Assessment: 65 year old female presented to the ED with SOB. On heparin gtt for AFib. Heparin level this morning was supratherapeutic. No s/sx bleeding. CBC wnl.  Goal of Therapy:  Heparin level 0.3-0.7 units/ml Monitor platelets by anticoagulation protocol: Yes   Plan:  -Reduce heparin to 1100 units/hr -Daily HL, CBC -Check confirmatory level this afternoon    Agapito Games, PharmD, BCPS Clinical Pharmacist 07/24/2016 9:54 AM

## 2016-07-24 NOTE — Progress Notes (Signed)
Patient Name: [name] Date of Encounter: 07/24/2016  Primary Cardiologist: new  Hospital Problem List     Principal Problem:   Acute respiratory failure with hypoxia (HCC) Active Problems:   HTN (hypertension)   Glaucoma   Acute on chronic diastolic heart failure (HCC)   Atrial fibrillation with rapid ventricular response (HCC)   COPD exacerbation (HCC)   Hypercholesterolemia     Subjective   Breathing better  Inpatient Medications    Scheduled Meds: . diltiazem  15 mg Intravenous Once  . furosemide  20 mg Intravenous Daily  . ipratropium-albuterol  3 mL Nebulization Q12H  . irbesartan  150 mg Oral Daily  . latanoprost  1 drop Both Eyes QHS  . predniSONE  40 mg Oral Q breakfast  . simvastatin  20 mg Oral Daily  . timolol  1 drop Both Eyes BID   Continuous Infusions: . diltiazem (CARDIZEM) infusion 5 mg/hr (07/24/16 1004)  . heparin 1,100 Units/hr (07/24/16 0947)   PRN Meds: albuterol, HYDROcodone-acetaminophen, LORazepam, ondansetron (ZOFRAN) IV   Vital Signs    Vitals:   07/24/16 0040 07/24/16 0557 07/24/16 0952 07/24/16 1007  BP: 117/90 (!) 125/93  (!) 153/105  Pulse: 100 (!) 101  (!) 106  Resp: 18 18    Temp: 97.7 F (36.5 C) 97.9 F (36.6 C)    TempSrc: Oral Oral    SpO2: 94% 92% 95%   Weight:  202 lb 8 oz (91.9 kg)    Height:        Intake/Output Summary (Last 24 hours) at 07/24/16 1146 Last data filed at 07/24/16 1100  Gross per 24 hour  Intake                0 ml  Output             1750 ml  Net            -1750 ml   Filed Weights   07/23/16 1414 07/23/16 1847 07/24/16 0557  Weight: 205 lb (93 kg) 203 lb 12.8 oz (92.4 kg) 202 lb 8 oz (91.9 kg)    Physical Exam    GEN: Well nourished, well developed, in no acute distress.  HEENT: Grossly normal.  Neck: Supple, no JVD, carotid bruits, or masses. Cardiac: irregularly irregular,   Respiratory:  Respirations regular and unlabored, clear to auscultation bilaterally. GI: Soft,  nontender, nondistended, BS + x 4. MS: no deformity or atrophy. Skin: warm and dry, no rash. Neuro:  Strength and sensation are intact. Psych: AAOx3.  Normal affect.  Labs    CBC  Recent Labs  07/23/16 1416 07/24/16 0044  WBC 8.2 7.8  NEUTROABS 5.7  --   HGB 16.5* 16.9*  HCT 49.9* 51.1*  MCV 93.8 94.8  PLT 151 172   Basic Metabolic Panel  Recent Labs  07/23/16 1416 07/24/16 0044  NA 141 141  K 4.1 4.5  CL 108 102  CO2 26 29  GLUCOSE 132* 164*  BUN 18 23*  CREATININE 0.98 1.13*  CALCIUM 9.8 9.9  MG  --  2.0   Liver Function Tests  Recent Labs  07/23/16 1416  AST 25  ALT 19  ALKPHOS 72  BILITOT 0.7  PROT 6.6  ALBUMIN 3.8   No results for input(s): LIPASE, AMYLASE in the last 72 hours. Cardiac Enzymes No results for input(s): CKTOTAL, CKMB, CKMBINDEX, TROPONINI in the last 72 hours. BNP Invalid input(s): POCBNP D-Dimer No results for input(s): DDIMER in the last  72 hours. Hemoglobin A1C No results for input(s): HGBA1C in the last 72 hours. Fasting Lipid Panel  Recent Labs  07/24/16 0044  CHOL 205*  HDL 64  LDLCALC 131*  TRIG 50  CHOLHDL 3.2   Thyroid Function Tests No results for input(s): TSH, T4TOTAL, T3FREE, THYROIDAB in the last 72 hours.  Invalid input(s): FREET3  Telemetry    *AFib with RVR - Personally Reviewed  ECG      Radiology    Dg Chest 2 View  Result Date: 07/23/2016 CLINICAL DATA:  One month of dyspnea. History of emphysema, former smoker, CHF. EXAM: CHEST  2 VIEW COMPARISON:  PA and lateral chest x-ray of June 25, 2016 FINDINGS: The lungs remain hyperinflated. The interstitial markings remain increased diffusely. There is no alveolar infiltrate. The heart is top-normal in size. The pulmonary vascularity is not engorged. There is calcification in the wall of the aortic arch. The observed bony thorax exhibits no acute abnormality. There is multilevel degenerative disc disease of the thoracic spine. IMPRESSION: Stable  chronic bronchitic changes with probable superimposed low-grade CHF. No alveolar pneumonia. Thoracic aortic atherosclerosis. Electronically Signed   By: David  [place]Jordan M.D.   On: 07/23/2016 13:39    Cardiac Studies   Normal EF by echo in 6/17  Patient Profile     65 y/o with AFib  Assessment & Plan    1) Increase diltiazem for better rate control.  Plan for TEE/CV tomorrow to restore NSR.    IV heparin for stroke prevention followed by Eliquis 5 mg BID.  Lasix for acute on chronic diastolic heart failure.  Counseled her about low salt diet.  Signed, Lance MussJayadeep Windell Musson, MD  07/24/2016, 11:46 AM

## 2016-07-24 NOTE — Progress Notes (Signed)
PROGRESS NOTE  [name] ZOX:[id number] DOB: 1950-11-05 DOA: 07/23/2016 PCP: [name], MD   LOS: 1 day   Brief Narrative: Patient is a 65 yo female with history of HTN, HLD, long-term tobacco abuse (quit 5 years ago), emphysema, and glaucoma. She was sent to the ED 11/28 from her pulmonology office for hypoxemia and AFib. Reports 1 month of worsening intermittent dyspnea and associated cough with white sputum production. Found to be in no distress, SpO2 80s on arrival, SpO2 upper 90s with 2L O2, and AFib RVR in the 120s. Admitted for further evaluation and treatment.  Assessment & Plan: Principal Problem:   Acute respiratory failure with hypoxia (HCC) Active Problems:   HTN (hypertension)   Glaucoma   Acute on chronic diastolic heart failure (HCC)   Atrial fibrillation with rapid ventricular response (HCC)   COPD exacerbation (HCC)   Hypercholesterolemia   Acute respiratory failure with hypoxia - Likely secondary to new AFib RVR in the setting of AOC diastolic CHF (ECHO 01/2016). - CXR shows hyperinflation with stable chronic bronchitic changes. No consolidation. Recent PFT suggests a restrictive pattern. - Continue scheduled Duoneb and Albuterol neb prn.  - Continue daily weights, strict I/O. - Recommend outpatient sleep study.  Atrial Fibrillation with RVR - New diagnosis but thought to be present since symptom onset ~1 month ago.  - ECHO 01/2016 showed grade 1 diastolic dysfunction, EF 60-65%, normal wall motion, mildly dilated R atrium and ventricle. - BNP 134 on admission. - Cardiology recommends rate control with IV Diltiazem, and IV Heparin for stroke prevention, ?DCCV tomorrow  Hypertension - Currently on Irbesartan. - Cr 0.98 >> 1.13. - BP up to 153/105 this am on cardizem IV  Glaucoma - Chronic, stable.   Hyperlipidemia - Cholesterol 205, LDL 131 - Continue Simvastatin.   DVT prophylaxis: Heparin Code Status: Full Family Communication: sister @  bedside Disposition Plan: home when ready  Consultants:  Cardiology  Procedures:  None  Antimicrobials: None  Subjective: Patient is sitting and in no acute distress. Moderately SOB at present. Nasal canula on 2L O2. Complains of mild HA.   Objective: Vitals:   07/24/16 0040 07/24/16 0557 07/24/16 0952 07/24/16 1007  BP: 117/90 (!) 125/93  (!) 153/105  Pulse: 100 (!) 101  (!) 106  Resp: 18 18    Temp: 97.7 F (36.5 C) 97.9 F (36.6 C)    TempSrc: Oral Oral    SpO2: 94% 92% 95%   Weight:  91.9 kg (202 lb 8 oz)    Height:        Intake/Output Summary (Last 24 hours) at 07/24/16 1149 Last data filed at 07/24/16 1100  Gross per 24 hour  Intake                0 ml  Output             1750 ml  Net            -1750 ml   Filed Weights   07/23/16 1414 07/23/16 1847 07/24/16 0557  Weight: 93 kg (205 lb) 92.4 kg (203 lb 12.8 oz) 91.9 kg (202 lb 8 oz)    Examination: Constitutional: NAD Vitals:   07/24/16 0040 07/24/16 0557 07/24/16 0952 07/24/16 1007  BP: 117/90 (!) 125/93  (!) 153/105  Pulse: 100 (!) 101  (!) 106  Resp: 18 18    Temp: 97.7 F (36.5 C) 97.9 F (36.6 C)    TempSrc: Oral Oral    SpO2: 94%  92% 95%   Weight:  91.9 kg (202 lb 8 oz)    Height:       Eyes: PERRL Respiratory: Tachypneic and speaking in short sentences.  Cardiovascular: Irregular rate and rhythm. No murmurs / rubs / gallops appreciated. 2+ LE edema.  Abdomen: no tenderness. Bowel sounds positive.  Musculoskeletal: no clubbing / cyanosis.  Skin: no rashes, lesions, ulcers. No induration Neurologic: non focal    Data Reviewed: I have personally reviewed following labs and imaging studies  CBC:  Recent Labs Lab 07/23/16 1416 07/24/16 0044  WBC 8.2 7.8  NEUTROABS 5.7  --   HGB 16.5* 16.9*  HCT 49.9* 51.1*  MCV 93.8 94.8  PLT 151 172   Basic Metabolic Panel:  Recent Labs Lab 07/23/16 1416 07/24/16 0044  NA 141 141  K 4.1 4.5  CL 108 102  CO2 26 29  GLUCOSE 132* 164*    BUN 18 23*  CREATININE 0.98 1.13*  CALCIUM 9.8 9.9  MG  --  2.0   GFR: Estimated Creatinine Clearance: 49.6 mL/min (by C-G formula based on SCr of 1.13 mg/dL (H)). Liver Function Tests:  Recent Labs Lab 07/23/16 1416  AST 25  ALT 19  ALKPHOS 72  BILITOT 0.7  PROT 6.6  ALBUMIN 3.8   No results for input(s): LIPASE, AMYLASE in the last 168 hours. No results for input(s): AMMONIA in the last 168 hours. Coagulation Profile: No results for input(s): INR, PROTIME in the last 168 hours. Cardiac Enzymes: No results for input(s): CKTOTAL, CKMB, CKMBINDEX, TROPONINI in the last 168 hours. BNP (last 3 results)  Recent Labs  06/25/16 1308  PROBNP 141.0*   HbA1C: No results for input(s): HGBA1C in the last 72 hours. CBG: No results for input(s): GLUCAP in the last 168 hours. Lipid Profile:  Recent Labs  07/24/16 0044  CHOL 205*  HDL 64  LDLCALC 131*  TRIG 50  CHOLHDL 3.2   Thyroid Function Tests: No results for input(s): TSH, T4TOTAL, FREET4, T3FREE, THYROIDAB in the last 72 hours. Anemia Panel: No results for input(s): VITAMINB12, FOLATE, FERRITIN, TIBC, IRON, RETICCTPCT in the last 72 hours. Urine analysis:    Component Value Date/Time   COLORURINE YELLOW 07/23/2016 1641   APPEARANCEUR CLEAR 07/23/2016 1641   LABSPEC 1.021 07/23/2016 1641   PHURINE 6.0 07/23/2016 1641   GLUCOSEU NEGATIVE 07/23/2016 1641   HGBUR NEGATIVE 07/23/2016 1641   BILIRUBINUR NEGATIVE 07/23/2016 1641   KETONESUR NEGATIVE 07/23/2016 1641   PROTEINUR 30 (A) 07/23/2016 1641   NITRITE NEGATIVE 07/23/2016 1641   LEUKOCYTESUR MODERATE (A) 07/23/2016 1641   Sepsis Labs: Invalid input(s): PROCALCITONIN, LACTICIDVEN  No results found for this or any previous visit (from the past 240 hour(s)).    Radiology Studies: Dg Chest 2 View  Result Date: 07/23/2016 CLINICAL DATA:  One month of dyspnea. History of emphysema, former smoker, CHF. EXAM: CHEST  2 VIEW COMPARISON:  PA and lateral chest  x-ray of June 25, 2016 FINDINGS: The lungs remain hyperinflated. The interstitial markings remain increased diffusely. There is no alveolar infiltrate. The heart is top-normal in size. The pulmonary vascularity is not engorged. There is calcification in the wall of the aortic arch. The observed bony thorax exhibits no acute abnormality. There is multilevel degenerative disc disease of the thoracic spine. IMPRESSION: Stable chronic bronchitic changes with probable superimposed low-grade CHF. No alveolar pneumonia. Thoracic aortic atherosclerosis. Electronically Signed   By: David  [place] M.D.   On: 07/23/2016 13:39     Scheduled Meds: .  diltiazem  15 mg Intravenous Once  . furosemide  20 mg Intravenous Daily  . ipratropium-albuterol  3 mL Nebulization Q12H  . irbesartan  150 mg Oral Daily  . latanoprost  1 drop Both Eyes QHS  . predniSONE  40 mg Oral Q breakfast  . simvastatin  20 mg Oral Daily  . sodium chloride flush  3 mL Intravenous Q12H  . timolol  1 drop Both Eyes BID   Continuous Infusions: . sodium chloride    . diltiazem (CARDIZEM) infusion 5 mg/hr (07/24/16 1004)  . heparin 1,100 Units/hr (07/24/16 0947)   Pamella Pert, MD, PhD [facility] Pager [phone]  If 7PM-7AM, please contact night-coverage [email or web address] Password [facility] 07/24/2016, 11:49 AM

## 2016-07-24 NOTE — Progress Notes (Signed)
ANTICOAGULATION CONSULT NOTE  Pharmacy Consult for heparin Indication: atrial fibrillation  Allergies  Allergen Reactions  . Percocet [Oxycodone-Acetaminophen] Itching    Patient Measurements: Height: 5' (152.4 cm) Weight: 202 lb 8 oz (91.9 kg) IBW/kg (Calculated) : 45.5 Heparin Dosing Weight: 67.6kg  Vital Signs: Temp: 98 F (36.7 C) (11/29 1159) Temp Source: Oral (11/29 1159) BP: 119/68 (11/29 1159) Pulse Rate: 84 (11/29 1159)  Labs:  Recent Labs  07/23/16 1416 07/24/16 0044 07/24/16 0747 07/24/16 1532  HGB 16.5* 16.9*  --   --   HCT 49.9* 51.1*  --   --   PLT 151 172  --   --   HEPARINUNFRC  --  <0.10* 0.92* 0.53  CREATININE 0.98 1.13*  --   --     Estimated Creatinine Clearance: 49.6 mL/min (by C-G formula based on SCr of 1.13 mg/dL (H)).   Medical History: Past Medical History:  Diagnosis Date  . Emphysema of lung (HCC)   . Hypercholesterolemia   . Hypertension   . Osteoarthritis     Medications:  Infusions:  . sodium chloride 250 mL (07/24/16 1201)  . diltiazem (CARDIZEM) infusion 10 mg/hr (07/24/16 1157)  . heparin 1,100 Units/hr (07/24/16 3817)    Assessment: 65 year old female presented to the ED with SOB. Continues on heparin for AFib. HL therapeutic at 0.53. CBC stable. No bleeding. Plan to change to Eliquis per cards.    Goal of Therapy:  Heparin level 0.3-0.7 units/ml Monitor platelets by anticoagulation protocol: Yes   Plan:  -Continue heparin at 1100 units/hr -Daily HL/CBC -Monitor s/s bleeding -F/U transition to Eliquis   Sherle Poe, PharmD Clinical Pharmacist 5:01 PM, 07/24/2016

## 2016-07-25 ENCOUNTER — Other Ambulatory Visit: Payer: Self-pay

## 2016-07-25 ENCOUNTER — Encounter (HOSPITAL_COMMUNITY): Payer: Self-pay

## 2016-07-25 ENCOUNTER — Inpatient Hospital Stay (HOSPITAL_COMMUNITY): Payer: Medicare Other

## 2016-07-25 ENCOUNTER — Inpatient Hospital Stay (HOSPITAL_COMMUNITY): Payer: Medicare Other | Admitting: Certified Registered Nurse Anesthetist

## 2016-07-25 ENCOUNTER — Encounter (HOSPITAL_COMMUNITY)
Admission: EM | Disposition: A | Discharge status: Home / Self Care | Payer: Self-pay | Source: Home / Self Care | Attending: Internal Medicine

## 2016-07-25 DIAGNOSIS — I4891 Unspecified atrial fibrillation: Secondary | ICD-10-CM

## 2016-07-25 HISTORY — PX: CARDIOVERSION: SHX1299

## 2016-07-25 HISTORY — PX: TEE WITHOUT CARDIOVERSION: SHX5443

## 2016-07-25 LAB — BASIC METABOLIC PANEL
Anion gap: 11 (ref 5–15)
BUN: 55 mg/dL — AB (ref 6–20)
CALCIUM: 8.9 mg/dL (ref 8.9–10.3)
CO2: 24 mmol/L (ref 22–32)
CREATININE: 1.75 mg/dL — AB (ref 0.44–1.00)
Chloride: 101 mmol/L (ref 101–111)
GFR calc Af Amer: 34 mL/min — ABNORMAL LOW (ref >60)
GFR, EST NON AFRICAN AMERICAN: 29 mL/min — AB (ref >60)
GLUCOSE: 116 mg/dL — AB (ref 65–99)
POTASSIUM: 4 mmol/L (ref 3.5–5.1)
SODIUM: 136 mmol/L (ref 135–145)

## 2016-07-25 LAB — CBC
HCT: 45.4 % (ref 36.0–46.0)
Hemoglobin: 14.7 g/dL (ref 12.0–15.0)
MCH: 30.7 pg (ref 26.0–34.0)
MCHC: 32.4 g/dL (ref 30.0–36.0)
MCV: 94.8 fL (ref 78.0–100.0)
PLATELETS: 153 10*3/uL (ref 150–400)
RBC: 4.79 MIL/uL (ref 3.87–5.11)
RDW: 14.8 % (ref 11.5–15.5)
WBC: 14.2 10*3/uL — ABNORMAL HIGH (ref 4.0–10.5)

## 2016-07-25 LAB — HEPARIN LEVEL (UNFRACTIONATED): Heparin Unfractionated: 0.54 IU/mL (ref 0.30–0.70)

## 2016-07-25 SURGERY — CARDIOVERSION
Anesthesia: General

## 2016-07-25 MED ORDER — DILTIAZEM HCL ER COATED BEADS 120 MG PO CP24
120.0000 mg | ORAL_CAPSULE | Freq: Every day | ORAL | Status: DC
  Administered 2016-07-25 – 2016-07-27 (×3): 120 mg via ORAL
  Filled 2016-07-25 (×3): qty 1

## 2016-07-25 MED ORDER — PROPOFOL 10 MG/ML IV BOLUS
INTRAVENOUS | Status: DC | PRN
  Administered 2016-07-25: 20 mg via INTRAVENOUS

## 2016-07-25 MED ORDER — ATORVASTATIN CALCIUM 10 MG PO TABS
10.0000 mg | ORAL_TABLET | Freq: Every day | ORAL | Status: DC
  Administered 2016-07-25 – 2016-07-26 (×2): 10 mg via ORAL
  Filled 2016-07-25 (×2): qty 1

## 2016-07-25 MED ORDER — LACTATED RINGERS IV SOLN
INTRAVENOUS | Status: DC | PRN
  Administered 2016-07-25: 14:00:00 via INTRAVENOUS

## 2016-07-25 MED ORDER — PROPOFOL 500 MG/50ML IV EMUL
INTRAVENOUS | Status: DC | PRN
  Administered 2016-07-25: 60 ug/kg/min via INTRAVENOUS

## 2016-07-25 MED ORDER — APIXABAN 5 MG PO TABS
5.0000 mg | ORAL_TABLET | Freq: Two times a day (BID) | ORAL | Status: DC
  Administered 2016-07-25 – 2016-07-27 (×4): 5 mg via ORAL
  Filled 2016-07-25 (×4): qty 1

## 2016-07-25 NOTE — H&P (View-Only) (Signed)
Patient Name: [name] Date of Encounter: 07/24/2016  Primary Cardiologist: new  Hospital Problem List     Principal Problem:   Acute respiratory failure with hypoxia (HCC) Active Problems:   HTN (hypertension)   Glaucoma   Acute on chronic diastolic heart failure (HCC)   Atrial fibrillation with rapid ventricular response (HCC)   COPD exacerbation (HCC)   Hypercholesterolemia     Subjective   Breathing better  Inpatient Medications    Scheduled Meds: . diltiazem  15 mg Intravenous Once  . furosemide  20 mg Intravenous Daily  . ipratropium-albuterol  3 mL Nebulization Q12H  . irbesartan  150 mg Oral Daily  . latanoprost  1 drop Both Eyes QHS  . predniSONE  40 mg Oral Q breakfast  . simvastatin  20 mg Oral Daily  . timolol  1 drop Both Eyes BID   Continuous Infusions: . diltiazem (CARDIZEM) infusion 5 mg/hr (07/24/16 1004)  . heparin 1,100 Units/hr (07/24/16 0947)   PRN Meds: albuterol, HYDROcodone-acetaminophen, LORazepam, ondansetron (ZOFRAN) IV   Vital Signs    Vitals:   07/24/16 0040 07/24/16 0557 07/24/16 0952 07/24/16 1007  BP: 117/90 (!) 125/93  (!) 153/105  Pulse: 100 (!) 101  (!) 106  Resp: 18 18    Temp: 97.7 F (36.5 C) 97.9 F (36.6 C)    TempSrc: Oral Oral    SpO2: 94% 92% 95%   Weight:  202 lb 8 oz (91.9 kg)    Height:        Intake/Output Summary (Last 24 hours) at 07/24/16 1146 Last data filed at 07/24/16 1100  Gross per 24 hour  Intake                0 ml  Output             1750 ml  Net            -1750 ml   Filed Weights   07/23/16 1414 07/23/16 1847 07/24/16 0557  Weight: 205 lb (93 kg) 203 lb 12.8 oz (92.4 kg) 202 lb 8 oz (91.9 kg)    Physical Exam    GEN: Well nourished, well developed, in no acute distress.  HEENT: Grossly normal.  Neck: Supple, no JVD, carotid bruits, or masses. Cardiac: irregularly irregular,   Respiratory:  Respirations regular and unlabored, clear to auscultation bilaterally. GI: Soft,  nontender, nondistended, BS + x 4. MS: no deformity or atrophy. Skin: warm and dry, no rash. Neuro:  Strength and sensation are intact. Psych: AAOx3.  Normal affect.  Labs    CBC  Recent Labs  07/23/16 1416 07/24/16 0044  WBC 8.2 7.8  NEUTROABS 5.7  --   HGB 16.5* 16.9*  HCT 49.9* 51.1*  MCV 93.8 94.8  PLT 151 172   Basic Metabolic Panel  Recent Labs  07/23/16 1416 07/24/16 0044  NA 141 141  K 4.1 4.5  CL 108 102  CO2 26 29  GLUCOSE 132* 164*  BUN 18 23*  CREATININE 0.98 1.13*  CALCIUM 9.8 9.9  MG  --  2.0   Liver Function Tests  Recent Labs  07/23/16 1416  AST 25  ALT 19  ALKPHOS 72  BILITOT 0.7  PROT 6.6  ALBUMIN 3.8   No results for input(s): LIPASE, AMYLASE in the last 72 hours. Cardiac Enzymes No results for input(s): CKTOTAL, CKMB, CKMBINDEX, TROPONINI in the last 72 hours. BNP Invalid input(s): POCBNP D-Dimer No results for input(s): DDIMER in the last  72 hours. Hemoglobin A1C No results for input(s): HGBA1C in the last 72 hours. Fasting Lipid Panel  Recent Labs  07/24/16 0044  CHOL 205*  HDL 64  LDLCALC 131*  TRIG 50  CHOLHDL 3.2   Thyroid Function Tests No results for input(s): TSH, T4TOTAL, T3FREE, THYROIDAB in the last 72 hours.  Invalid input(s): FREET3  Telemetry    *AFib with RVR - Personally Reviewed  ECG      Radiology    Dg Chest 2 View  Result Date: 07/23/2016 CLINICAL DATA:  One month of dyspnea. History of emphysema, former smoker, CHF. EXAM: CHEST  2 VIEW COMPARISON:  PA and lateral chest x-ray of June 25, 2016 FINDINGS: The lungs remain hyperinflated. The interstitial markings remain increased diffusely. There is no alveolar infiltrate. The heart is top-normal in size. The pulmonary vascularity is not engorged. There is calcification in the wall of the aortic arch. The observed bony thorax exhibits no acute abnormality. There is multilevel degenerative disc disease of the thoracic spine. IMPRESSION: Stable  chronic bronchitic changes with probable superimposed low-grade CHF. No alveolar pneumonia. Thoracic aortic atherosclerosis. Electronically Signed   By: David  Jordan M.D.   On: 07/23/2016 13:39    Cardiac Studies   Normal EF by echo in 6/17  Patient Profile     65 y/o with AFib  Assessment & Plan    1) Increase diltiazem for better rate control.  Plan for TEE/CV tomorrow to restore NSR.    IV heparin for stroke prevention followed by Eliquis 5 mg BID.  Lasix for acute on chronic diastolic heart failure.  Counseled her about low salt diet.  Signed, Audley Hinojos, MD  07/24/2016, 11:46 AM   

## 2016-07-25 NOTE — Progress Notes (Signed)
Nutrition Education Note  RD consulted for nutrition education and assessment of nutrition status.  Lipid Panel     Component Value Date/Time   CHOL 205 (H) 07/24/2016 0044   TRIG 50 07/24/2016 0044   HDL 64 07/24/2016 0044   CHOLHDL 3.2 07/24/2016 0044   VLDL 10 07/24/2016 0044   LDLCALC 131 (H) 07/24/2016 0044   Reviewed patient's dietary recall. Pt states that her appetite has been good and she has been eating normally. She usually eats 2-3 meals daily. Mostly cooks at home and eats out about 2 times per month. She reports eating eggs and toast for breakfast, a [place] sandwich for lunch, and spaghetti or country fried steak for dinner. RD asked if patient adds salt to food and patient stated no, but children at bedside stated "Mom, don't lie". RD discussed the myriad of health benefits of adding fruit and vegetables to every meal.   RD provided "Low Sodium Nutrition Therapy" handout from the Academy of [employer]. We discussed healthy low sodium foods. Provided examples on ways to decrease sodium and fat intake in diet. Discouraged use of salt shaker. Encouraged fresh fruits and vegetables as well as whole grain sources of carbohydrates to maximize fiber intake. Teach back method used.  Expect fair compliance. Pt states that she followed a "[place]" diet once that focused on smaller portion sizes, fruit with every breakfast, and vegetables with lunch and dinner.   Body mass index is 40.33 kg/m. Pt meets criteria for Morbid Obesity based on current BMI.  Current diet order is NPO. Labs and medications reviewed. No further nutrition interventions warranted at this time. RD contact information provided. If additional nutrition issues arise, please re-consult RD.  Scarlette Ar RD, CSP, LDN Inpatient Clinical Dietitian Pager: [phone] After Hours Pager: [phone]

## 2016-07-25 NOTE — Progress Notes (Signed)
PROGRESS NOTE  [name] [id number] DOB: August 22, 1951 DOA: 07/23/2016 PCP: Johny BlamerHARRIS, WILLIAM, MD   LOS: 2 days   Brief Narrative: Patient is a 65 yo female with history of HTN, HLD, long-term tobacco abuse (quit 5 years ago), emphysema, and glaucoma. She was sent to the ED 11/28 from her pulmonology office for hypoxemia and AFib. Reports 1 month of worsening intermittent dyspnea and associated cough with white sputum production. Found to be in no distress, SpO2 80s on arrival, SpO2 upper 90s with 2L O2, and AFib RVR in the 120s. Admitted for further evaluation and treatment.  Assessment & Plan: Principal Problem:   Acute respiratory failure with hypoxia (HCC) Active Problems:   HTN (hypertension)   Glaucoma   Acute on chronic diastolic heart failure (HCC)   Atrial fibrillation with rapid ventricular response (HCC)   COPD exacerbation (HCC)   Hypercholesterolemia   Acute respiratory failure with hypoxia - Likely secondary to new AFib RVR in the setting of AOC diastolic CHF (ECHO 01/2016). - CXR shows hyperinflation with stable chronic bronchitic changes. No consolidation. Recent PFT suggests a restrictive pattern. - Continue scheduled Duoneb and Albuterol neb prn.  - Continue daily weights, strict I/O. - Recommend outpatient sleep study.  Atrial Fibrillation with RVR - New diagnosis but thought to be present since symptom onset ~1 month ago.  - ECHO 01/2016 showed grade 1 diastolic dysfunction, EF 60-65%, normal wall motion, mildly dilated R atrium and ventricle. - BNP 134 on admission. - Cardiology recommends rate control with IV Diltiazem, and IV Heparin for stroke prevention, ?DCCV tomorrow  AKI - patient with progressively worsening Cr, due to lasix, stop Lasix, stop ARB for now, avoid nephrotoxins  Hypertension - Currently on Irbesartan. - Cr 0.98 >> 1.13. - BP up to 153/105 this am on cardizem IV  Glaucoma - Chronic, stable.   Hyperlipidemia - Cholesterol  205, LDL 131 - Continue Simvastatin.   DVT prophylaxis: Heparin Code Status: Full Family Communication: family @ bedside Disposition Plan: home when ready  Consultants:  Cardiology  Procedures:  None  Antimicrobials: None  Subjective: - ongoing dyspnea on my evaluation  Objective: Vitals:   07/24/16 1159 07/24/16 2106 07/25/16 0458 07/25/16 1039  BP: 119/68 (!) 93/54 (!) 102/56   Pulse: 84 62 78   Resp:  18 18   Temp: 98 F (36.7 C) 97.9 F (36.6 C) 97.5 F (36.4 C)   TempSrc: Oral Oral Oral   SpO2: 96% 95% 95% 92%  Weight:   93.7 kg (206 lb 8 oz)   Height:        Intake/Output Summary (Last 24 hours) at 07/25/16 1050 Last data filed at 07/25/16 0458  Gross per 24 hour  Intake              360 ml  Output              650 ml  Net             -290 ml   Filed Weights   07/23/16 1847 07/24/16 0557 07/25/16 0458  Weight: 92.4 kg (203 lb 12.8 oz) 91.9 kg (202 lb 8 oz) 93.7 kg (206 lb 8 oz)    Examination: Constitutional: NAD Vitals:   07/24/16 1159 07/24/16 2106 07/25/16 0458 07/25/16 1039  BP: 119/68 (!) 93/54 (!) 102/56   Pulse: 84 62 78   Resp:  18 18   Temp: 98 F (36.7 C) 97.9 F (36.6 C) 97.5 F (36.4 C)   TempSrc:  Oral Oral Oral   SpO2: 96% 95% 95% 92%  Weight:   93.7 kg (206 lb 8 oz)   Height:       Eyes: PERRL Respiratory: Tachypneic and speaking in short sentences.  Cardiovascular: Irregular rate and rhythm. No murmurs / rubs / gallops appreciated. 1-2+ LE edema.  Abdomen: no tenderness. Bowel sounds positive.  Musculoskeletal: no clubbing / cyanosis.  Skin: no rashes, lesions, ulcers. No induration Neurologic: non focal     Data Reviewed: I have personally reviewed following labs and imaging studies  CBC:  Recent Labs Lab 07/23/16 1416 07/24/16 0044 07/25/16 0427  WBC 8.2 7.8 14.2*  NEUTROABS 5.7  --   --   HGB 16.5* 16.9* 14.7  HCT 49.9* 51.1* 45.4  MCV 93.8 94.8 94.8  PLT 151 172 153   Basic Metabolic Panel:  Recent  Labs Lab 07/23/16 1416 07/24/16 0044 07/25/16 0427  NA 141 141 136  K 4.1 4.5 4.0  CL 108 102 101  CO2 26 29 24   GLUCOSE 132* 164* 116*  BUN 18 23* 55*  CREATININE 0.98 1.13* 1.75*  CALCIUM 9.8 9.9 8.9  MG  --  2.0  --    GFR: Estimated Creatinine Clearance: 32.3 mL/min (by C-G formula based on SCr of 1.75 mg/dL (H)). Liver Function Tests:  Recent Labs Lab 07/23/16 1416  AST 25  ALT 19  ALKPHOS 72  BILITOT 0.7  PROT 6.6  ALBUMIN 3.8   No results for input(s): LIPASE, AMYLASE in the last 168 hours. No results for input(s): AMMONIA in the last 168 hours. Coagulation Profile: No results for input(s): INR, PROTIME in the last 168 hours. Cardiac Enzymes: No results for input(s): CKTOTAL, CKMB, CKMBINDEX, TROPONINI in the last 168 hours. BNP (last 3 results)  Recent Labs  06/25/16 1308  PROBNP 141.0*   HbA1C: No results for input(s): HGBA1C in the last 72 hours. CBG: No results for input(s): GLUCAP in the last 168 hours. Lipid Profile:  Recent Labs  07/24/16 0044  CHOL 205*  HDL 64  LDLCALC 131*  TRIG 50  CHOLHDL 3.2   Thyroid Function Tests: No results for input(s): TSH, T4TOTAL, FREET4, T3FREE, THYROIDAB in the last 72 hours. Anemia Panel: No results for input(s): VITAMINB12, FOLATE, FERRITIN, TIBC, IRON, RETICCTPCT in the last 72 hours. Urine analysis:    Component Value Date/Time   COLORURINE YELLOW 07/23/2016 1641   APPEARANCEUR CLEAR 07/23/2016 1641   LABSPEC 1.021 07/23/2016 1641   PHURINE 6.0 07/23/2016 1641   GLUCOSEU NEGATIVE 07/23/2016 1641   HGBUR NEGATIVE 07/23/2016 1641   BILIRUBINUR NEGATIVE 07/23/2016 1641   KETONESUR NEGATIVE 07/23/2016 1641   PROTEINUR 30 (A) 07/23/2016 1641   NITRITE NEGATIVE 07/23/2016 1641   LEUKOCYTESUR MODERATE (A) 07/23/2016 1641   Sepsis Labs: Invalid input(s): PROCALCITONIN, LACTICIDVEN  No results found for this or any previous visit (from the past 240 hour(s)).    Radiology Studies: Dg Chest 2  View  Result Date: 07/23/2016 CLINICAL DATA:  One month of dyspnea. History of emphysema, former smoker, CHF. EXAM: CHEST  2 VIEW COMPARISON:  PA and lateral chest x-ray of June 25, 2016 FINDINGS: The lungs remain hyperinflated. The interstitial markings remain increased diffusely. There is no alveolar infiltrate. The heart is top-normal in size. The pulmonary vascularity is not engorged. There is calcification in the wall of the aortic arch. The observed bony thorax exhibits no acute abnormality. There is multilevel degenerative disc disease of the thoracic spine. IMPRESSION: Stable chronic bronchitic changes with  probable superimposed low-grade CHF. No alveolar pneumonia. Thoracic aortic atherosclerosis. Electronically Signed   By: David  [place] M.D.   On: 07/23/2016 13:39     Scheduled Meds: . diltiazem  15 mg Intravenous Once  . furosemide  20 mg Intravenous Daily  . ipratropium-albuterol  3 mL Nebulization Q12H  . irbesartan  150 mg Oral Daily  . latanoprost  1 drop Both Eyes QHS  . simvastatin  20 mg Oral Daily  . sodium chloride flush  3 mL Intravenous Q12H  . sodium chloride flush  3 mL Intravenous Q12H  . timolol  1 drop Both Eyes BID   Continuous Infusions: . sodium chloride 250 mL (07/24/16 1201)  . diltiazem (CARDIZEM) infusion 10 mg/hr (07/25/16 1914)  . heparin 1,100 Units/hr (07/24/16 0947)   Pamella Pert, MD, PhD [facility] Pager [phone]  If 7PM-7AM, please contact night-coverage [email or web address] Password [email or web address] 07/25/2016, 10:50 AM

## 2016-07-25 NOTE — Progress Notes (Signed)
Patient Name: [name] Date of Encounter: 07/25/2016  Primary Cardiologist: [facility] Problem List     Principal Problem:   Acute respiratory failure with hypoxia [facility]) Active Problems:   HTN (hypertension)   Glaucoma   Acute on chronic diastolic heart failure (HCC)   Atrial fibrillation with rapid ventricular response (HCC)   COPD exacerbation (HCC)   Hypercholesterolemia     Subjective   Feels better post cardioversion.  Inpatient Medications    Scheduled Meds: . apixaban  5 mg Oral BID  . atorvastatin  10 mg Oral q1800  . diltiazem  120 mg Oral Daily  . diltiazem  15 mg Intravenous Once  . ipratropium-albuterol  3 mL Nebulization Q12H  . latanoprost  1 drop Both Eyes QHS  . sodium chloride flush  3 mL Intravenous Q12H  . timolol  1 drop Both Eyes BID   Continuous Infusions: . sodium chloride 250 mL (07/24/16 1201)   PRN Meds: albuterol, HYDROcodone-acetaminophen, LORazepam, ondansetron (ZOFRAN) IV, sodium chloride flush   Vital Signs    Vitals:   07/25/16 1421 07/25/16 1430 07/25/16 1440 07/25/16 1505  BP:   134/65 123/75  Pulse:  (!) 58 (!) 59 78  Resp:  15 17 20   Temp: 97.5 F (36.4 C)   97.5 F (36.4 C)  TempSrc: Oral   Oral  SpO2:  94% 92% 94%  Weight:      Height:        Intake/Output Summary (Last 24 hours) at 07/25/16 1718 Last data filed at 07/25/16 1505  Gross per 24 hour  Intake              885 ml  Output              850 ml  Net               35 ml   Filed Weights   07/23/16 1847 07/24/16 0557 07/25/16 0458  Weight: 203 lb 12.8 oz (92.4 kg) 202 lb 8 oz (91.9 kg) 206 lb 8 oz (93.7 kg)    Physical Exam   GEN: Well nourished, well developed, in no acute distress.  HEENT: Grossly normal.  Neck: Supple, no JVD, carotid bruits, or masses. Cardiac: RRR, premature beats, Radial right 2+ .  Respiratory:  Respirations regular and unlabored, clear to auscultation bilaterally. GI: Soft, nontender, nondistended, BS + x  4. MS: no deformity or atrophy. Skin: warm and dry, no rash. Neuro:  Strength and sensation are intact. Psych: AAOx3.  Normal affect.  Labs    CBC  Recent Labs  07/23/16 1416 07/24/16 0044 07/25/16 0427  WBC 8.2 7.8 14.2*  NEUTROABS 5.7  --   --   HGB 16.5* 16.9* 14.7  HCT 49.9* 51.1* 45.4  MCV 93.8 94.8 94.8  PLT 151 172 153   Basic Metabolic Panel  Recent Labs  07/24/16 0044 07/25/16 0427  NA 141 136  K 4.5 4.0  CL 102 101  CO2 29 24  GLUCOSE 164* 116*  BUN 23* 55*  CREATININE 1.13* 1.75*  CALCIUM 9.9 8.9  MG 2.0  --    Liver Function Tests  Recent Labs  07/23/16 1416  AST 25  ALT 19  ALKPHOS 72  BILITOT 0.7  PROT 6.6  ALBUMIN 3.8   No results for input(s): LIPASE, AMYLASE in the last 72 hours. Cardiac Enzymes No results for input(s): CKTOTAL, CKMB, CKMBINDEX, TROPONINI in the last 72 hours. BNP Invalid input(s): POCBNP D-Dimer  No results for input(s): DDIMER in the last 72 hours. Hemoglobin A1C No results for input(s): HGBA1C in the last 72 hours. Fasting Lipid Panel  Recent Labs  07/24/16 0044  CHOL 205*  HDL 64  LDLCALC 131*  TRIG 50  CHOLHDL 3.2   Thyroid Function Tests No results for input(s): TSH, T4TOTAL, T3FREE, THYROIDAB in the last 72 hours.  Invalid input(s): FREET3  Telemetry    AFib to NSR, PACs - Personally Reviewed  ECG    NSR - Personally Reviewed  Radiology    No results found.  Cardiac Studies   Normal EF by echo  Patient Profile     65 y/o s/p cardioversion for AFib  Assessment & Plan    1) Stop heparin.  Change to Eliquis 5 mg BID  2) Follow renal function.  Hold diuretic today.  3) reports productive cough.  WBC increased.  Will defer to IM.   Change diltiazem to oral 120 mg CD.  If she maintains NSR, could consider d/c tomorrow from a cardiac standpoint.  Signed, Lance MussJayadeep Joden Bonsall, MD  07/25/2016, 5:18 PM

## 2016-07-25 NOTE — Transfer of Care (Signed)
Immediate Anesthesia Transfer of Care Note  Patient: Vernetta Honey  Procedure(s) Performed: Procedure(s): CARDIOVERSION (N/A) TRANSESOPHAGEAL ECHOCARDIOGRAM (TEE) (N/A)  Patient Location: PACU  Anesthesia Type:General  Level of Consciousness: awake  Airway & Oxygen Therapy: Patient Spontanous Breathing and Patient connected to nasal cannula oxygen  Post-op Assessment: Report given to RN, Post -op Vital signs reviewed and stable and Patient moving all extremities X 4  Post vital signs: Reviewed and stable  Last Vitals:  Vitals:   07/25/16 1414 07/25/16 1418  BP:  111/62  Pulse: 65 63  Resp: (!) 28   Temp:      Last Pain:  Vitals:   07/25/16 1232  TempSrc: Oral  PainSc:          Complications: No apparent anesthesia complications

## 2016-07-25 NOTE — Anesthesia Postprocedure Evaluation (Signed)
Anesthesia Post Note  Patient: OAKLEE YANDOW  Procedure(s) Performed: Procedure(s) (LRB): CARDIOVERSION (N/A) TRANSESOPHAGEAL ECHOCARDIOGRAM (TEE) (N/A)  Patient location during evaluation: PACU Anesthesia Type: General Level of consciousness: awake and alert Pain management: pain level controlled Vital Signs Assessment: post-procedure vital signs reviewed and stable Respiratory status: spontaneous breathing, nonlabored ventilation, respiratory function stable and patient connected to nasal cannula oxygen Cardiovascular status: blood pressure returned to baseline and stable Postop Assessment: no signs of nausea or vomiting Anesthetic complications: no    Last Vitals:  Vitals:   07/25/16 1430 07/25/16 1440  BP:  134/65  Pulse: (!) 58 (!) 59  Resp: 15 17  Temp:      Last Pain:  Vitals:   07/25/16 1421  TempSrc: Oral  PainSc:                  Shelton Silvas

## 2016-07-25 NOTE — Progress Notes (Signed)
ECG showed Afib, not sure when pt converted back, but pt was cardioverted today and is back in Afib, controlled rate:60-80's, pt started on Cardizem po received med at about 5:30 today, on call MD notified, will continue to monitor, Thanks Lavonda Jumbo RN

## 2016-07-25 NOTE — Progress Notes (Signed)
  Echocardiogram Echocardiogram Transesophageal has been performed.  [name] 07/25/2016, 2:56 PM

## 2016-07-25 NOTE — Op Note (Signed)
Patient was anesthetized by anesthesia with Propofol With pads in apex / base position, pt cardioverted to SR with 200 J synchronized biphasic energy Procedure without complication. 12 lead EKG pending .

## 2016-07-25 NOTE — Anesthesia Preprocedure Evaluation (Addendum)
Anesthesia Evaluation  Patient identified by MRN, date of birth, ID band Patient awake    Reviewed: Allergy & Precautions, NPO status , Patient's Chart, lab work & pertinent test results, reviewed documented beta blocker date and time   Airway Mallampati: II  TM Distance: >3 FB Neck ROM: Full    Dental  (+) Teeth Intact, Dental Advisory Given   Pulmonary shortness of breath, pneumonia, COPD, former smoker,     + decreased breath sounds      Cardiovascular hypertension, Pt. on medications and Pt. on home beta blockers + dysrhythmias Atrial Fibrillation  Rhythm:Regular Rate:Normal     Neuro/Psych PSYCHIATRIC DISORDERS Depression    GI/Hepatic negative GI ROS, Neg liver ROS,   Endo/Other  Hyperthyroidism   Renal/GU negative Renal ROS  negative genitourinary   Musculoskeletal  (+) Arthritis , Osteoarthritis,    Abdominal   Peds negative pediatric ROS (+)  Hematology negative hematology ROS (+)   Anesthesia Other Findings   Reproductive/Obstetrics negative OB ROS                           Anesthesia Physical Anesthesia Plan  ASA: III  Anesthesia Plan: General   Post-op Pain Management:    Induction: Intravenous  Airway Management Planned: Natural Airway  Additional Equipment:   Intra-op Plan:   Post-operative Plan:   Informed Consent: I have reviewed the patients History and Physical, chart, labs and discussed the procedure including the risks, benefits and alternatives for the proposed anesthesia with the patient or authorized representative who has indicated his/her understanding and acceptance.     Plan Discussed with: CRNA  Anesthesia Plan Comments:         Anesthesia Quick Evaluation

## 2016-07-25 NOTE — Interval H&P Note (Signed)
History and Physical Interval Note:  07/25/2016 8:10 AM  [name]  has presented today for surgery, with the diagnosis of afib  The various methods of treatment have been discussed with the patient and family. After consideration of risks, benefits and other options for treatment, the patient has consented to  Procedure(s): CARDIOVERSION (N/A) TRANSESOPHAGEAL ECHOCARDIOGRAM (TEE) (N/A) as a surgical intervention .  The patient's history has been reviewed, patient examined, no change in status, stable for surgery.  I have reviewed the patient's chart and labs.  Questions were answered to the patient's satisfaction.     Dietrich Pates

## 2016-07-25 NOTE — Op Note (Signed)
LA, LAA without masses  Spontaneous contrast swirling in LAA that clears   No PFO by color doppler TV normal  Mild TR MV is mildly thickened.  Mild MR AV mildly thickened  No AI LVEF appears mildly decreased  RVEF is mildy decreased Mildly fixed atherosclerotic plaquing   Full report to follow

## 2016-07-26 ENCOUNTER — Encounter (HOSPITAL_COMMUNITY): Payer: Self-pay | Admitting: Internal Medicine

## 2016-07-26 ENCOUNTER — Inpatient Hospital Stay (HOSPITAL_COMMUNITY): Payer: Medicare Other

## 2016-07-26 DIAGNOSIS — I10 Essential (primary) hypertension: Secondary | ICD-10-CM

## 2016-07-26 LAB — CBC
HEMATOCRIT: 45.4 % (ref 36.0–46.0)
Hemoglobin: 14.3 g/dL (ref 12.0–15.0)
MCH: 30.3 pg (ref 26.0–34.0)
MCHC: 31.5 g/dL (ref 30.0–36.0)
MCV: 96.2 fL (ref 78.0–100.0)
PLATELETS: 152 10*3/uL (ref 150–400)
RBC: 4.72 MIL/uL (ref 3.87–5.11)
RDW: 15 % (ref 11.5–15.5)
WBC: 8.8 10*3/uL (ref 4.0–10.5)

## 2016-07-26 LAB — BASIC METABOLIC PANEL
Anion gap: 8 (ref 5–15)
BUN: 46 mg/dL — AB (ref 6–20)
CHLORIDE: 105 mmol/L (ref 101–111)
CO2: 26 mmol/L (ref 22–32)
Calcium: 9 mg/dL (ref 8.9–10.3)
Creatinine, Ser: 1.11 mg/dL — ABNORMAL HIGH (ref 0.44–1.00)
GFR calc Af Amer: 59 mL/min — ABNORMAL LOW (ref >60)
GFR, EST NON AFRICAN AMERICAN: 51 mL/min — AB (ref >60)
GLUCOSE: 94 mg/dL (ref 65–99)
POTASSIUM: 4 mmol/L (ref 3.5–5.1)
Sodium: 139 mmol/L (ref 135–145)

## 2016-07-26 LAB — HEPARIN LEVEL (UNFRACTIONATED): Heparin Unfractionated: 2.2 IU/mL — ABNORMAL HIGH (ref 0.30–0.70)

## 2016-07-26 MED ORDER — LEVOFLOXACIN 500 MG PO TABS
500.0000 mg | ORAL_TABLET | Freq: Every day | ORAL | Status: DC
  Administered 2016-07-26 – 2016-07-27 (×2): 500 mg via ORAL
  Filled 2016-07-26 (×2): qty 1

## 2016-07-26 MED ORDER — FUROSEMIDE 40 MG PO TABS
40.0000 mg | ORAL_TABLET | Freq: Every day | ORAL | Status: DC
  Administered 2016-07-26 – 2016-07-27 (×2): 40 mg via ORAL
  Filled 2016-07-26 (×2): qty 1

## 2016-07-26 NOTE — Discharge Instructions (Signed)

## 2016-07-26 NOTE — Progress Notes (Signed)
Patient refused bed alarm. Will continue to monitor.  

## 2016-07-26 NOTE — Care Management Note (Addendum)
Case Management Note  Patient Details  Name: TAMRYN SCULL MRN: [id number] Date of Birth: 16-Feb-1951               Action/Plan: Benefit check in progress for Eliquis co pay; patient has private insurance with [facility] with prescription drug coverage; Eliquis coupon card given to patient with instruction on activation prior to usage.  1523- RE: Benefit check  Received: Today  Message Contents  Mardene Sayer CMA        S/W LEE ANN @ [employer] [facility] # [phone]   ELIQUIS  5 MG BID 30 /60 TAB   COVER- YES  CO-PAY- $45.00   Q/L 2 TAB PER DAY  TIER- 3 DRUG  PRIOR APPROVAL- YES # [phone]  PHARMACY : [facility]      Expected Discharge Date:    possibly 07/27/2016              Expected Discharge Plan:  Home/Self Care  Discharge planning Services  CM Consult, Medication Assistance  Status of Service:  In process, will continue to follow  Reola Mosher [phone] 07/26/2016, 2:09 PM

## 2016-07-26 NOTE — Progress Notes (Signed)
PROGRESS NOTE  Vernetta HoneyJudy A Vandeusen ONG:[id number] DOB: [birth year] DOA: 07/23/2016 PCP: Johny BlamerHARRIS, WILLIAM, MD   LOS: 3 days   Brief Narrative: Patient is a 65 yo female with history of HTN, HLD, long-term tobacco abuse (quit 5 years ago), emphysema, and glaucoma. She was sent to the ED 11/28 from her pulmonology office for hypoxemia and AFib. Reports 1 month of worsening intermittent dyspnea and associated cough with white sputum production. Found to be in no distress, SpO2 80s on arrival, SpO2 upper 90s with 2L O2, and AFib RVR in the 120s. Admitted for further evaluation and treatment.  Assessment & Plan: Principal Problem:   Acute respiratory failure with hypoxia (HCC) Active Problems:   HTN (hypertension)   Glaucoma   Acute on chronic diastolic heart failure (HCC)   Atrial fibrillation with rapid ventricular response (HCC)   COPD exacerbation (HCC)   Hypercholesterolemia   Acute respiratory failure with hypoxia - Likely secondary to new AFib RVR in the setting of AOC diastolic CHF (ECHO 01/2016). - CXR shows hyperinflation with stable chronic bronchitic changes. No consolidation. Recent PFT suggests a restrictive pattern. - Continue scheduled Duoneb and Albuterol neb prn.  - Continue daily weights, strict I/O. - Recommend outpatient sleep study. - possible bronchitis, sputum now more yellow and changed in quality / quantity, cover empirically with levaquin  - wean off oxygen as tolerated  Atrial Fibrillation with RVR - New diagnosis but thought to be present since symptom onset ~1 month ago.  - ECHO 01/2016 showed grade 1 diastolic dysfunction, EF 60-65%, normal wall motion, mildly dilated R atrium and ventricle. - BNP 134 on admission. - s/p DCCV 11/30, back in A fib last night - now rate control is mainstay of treatment  - continue anticoagulation  AKI - patient with progressively worsening Cr 11/30, due to lasix, stop Lasix, stop ARB for now, avoid nephrotoxins - improved  this morning, resume po Lasix  Hypertension - hold Irbesartan.  Glaucoma - Chronic, stable.   Hyperlipidemia - Cholesterol 205, LDL 131 - Continue Simvastatin.   DVT prophylaxis: Heparin Code Status: Full Family Communication: no family @ bedside Disposition Plan: home when ready, likely 1 day  Consultants:  Cardiology  Procedures:  None  Antimicrobials: None  Subjective: - ongoing dyspnea on my evaluation  Objective: Vitals:   07/25/16 2244 07/26/16 0701 07/26/16 0953 07/26/16 1216  BP: 97/64 96/64  117/79  Pulse: 71 82  93  Resp:  20  18  Temp:  97.3 F (36.3 C)  97.4 F (36.3 C)  TempSrc:  Oral  Oral  SpO2:  95% 95%   Weight:  93.8 kg (206 lb 14.4 oz)    Height:        Intake/Output Summary (Last 24 hours) at 07/26/16 1337 Last data filed at 07/26/16 1145  Gross per 24 hour  Intake             1488 ml  Output             1950 ml  Net             -462 ml   Filed Weights   07/24/16 0557 07/25/16 0458 07/26/16 0701  Weight: 91.9 kg (202 lb 8 oz) 93.7 kg (206 lb 8 oz) 93.8 kg (206 lb 14.4 oz)    Examination: Constitutional: NAD Vitals:   07/25/16 2244 07/26/16 0701 07/26/16 0953 07/26/16 1216  BP: 97/64 96/64  117/79  Pulse: 71 82  93  Resp:  20  18  Temp:  97.3 F (36.3 C)  97.4 F (36.3 C)  TempSrc:  Oral  Oral  SpO2:  95% 95%   Weight:  93.8 kg (206 lb 14.4 oz)    Height:       Eyes: PERRL Respiratory: Tachypneic and speaking in short sentences.  Cardiovascular: Irregular rate and rhythm. No murmurs / rubs / gallops appreciated. 1-2+ LE edema.  Abdomen: no tenderness. Bowel sounds positive.  Musculoskeletal: no clubbing / cyanosis.  Skin: no rashes, lesions, ulcers. No induration Neurologic: non focal     Data Reviewed: I have personally reviewed following labs and imaging studies  CBC:  Recent Labs Lab 07/23/16 1416 07/24/16 0044 07/25/16 0427 07/26/16 0320  WBC 8.2 7.8 14.2* 8.8  NEUTROABS 5.7  --   --   --   HGB 16.5*  16.9* 14.7 14.3  HCT 49.9* 51.1* 45.4 45.4  MCV 93.8 94.8 94.8 96.2  PLT 151 172 153 152   Basic Metabolic Panel:  Recent Labs Lab 07/23/16 1416 07/24/16 0044 07/25/16 0427 07/26/16 0320  NA 141 141 136 139  K 4.1 4.5 4.0 4.0  CL 108 102 101 105  CO2 26 29 24 26   GLUCOSE 132* 164* 116* 94  BUN 18 23* 55* 46*  CREATININE 0.98 1.13* 1.75* 1.11*  CALCIUM 9.8 9.9 8.9 9.0  MG  --  2.0  --   --    GFR: Estimated Creatinine Clearance: 51 mL/min (by C-G formula based on SCr of 1.11 mg/dL (H)). Liver Function Tests:  Recent Labs Lab 07/23/16 1416  AST 25  ALT 19  ALKPHOS 72  BILITOT 0.7  PROT 6.6  ALBUMIN 3.8   No results for input(s): LIPASE, AMYLASE in the last 168 hours. No results for input(s): AMMONIA in the last 168 hours. Coagulation Profile: No results for input(s): INR, PROTIME in the last 168 hours. Cardiac Enzymes: No results for input(s): CKTOTAL, CKMB, CKMBINDEX, TROPONINI in the last 168 hours. BNP (last 3 results)  Recent Labs  06/25/16 1308  PROBNP 141.0*   HbA1C: No results for input(s): HGBA1C in the last 72 hours. CBG: No results for input(s): GLUCAP in the last 168 hours. Lipid Profile:  Recent Labs  07/24/16 0044  CHOL 205*  HDL 64  LDLCALC 131*  TRIG 50  CHOLHDL 3.2   Thyroid Function Tests: No results for input(s): TSH, T4TOTAL, FREET4, T3FREE, THYROIDAB in the last 72 hours. Anemia Panel: No results for input(s): VITAMINB12, FOLATE, FERRITIN, TIBC, IRON, RETICCTPCT in the last 72 hours. Urine analysis:    Component Value Date/Time   COLORURINE YELLOW 07/23/2016 1641   APPEARANCEUR CLEAR 07/23/2016 1641   LABSPEC 1.021 07/23/2016 1641   PHURINE 6.0 07/23/2016 1641   GLUCOSEU NEGATIVE 07/23/2016 1641   HGBUR NEGATIVE 07/23/2016 1641   BILIRUBINUR NEGATIVE 07/23/2016 1641   KETONESUR NEGATIVE 07/23/2016 1641   PROTEINUR 30 (A) 07/23/2016 1641   NITRITE NEGATIVE 07/23/2016 1641   LEUKOCYTESUR MODERATE (A) 07/23/2016 1641    Sepsis Labs: Invalid input(s): PROCALCITONIN, LACTICIDVEN  No results found for this or any previous visit (from the past 240 hour(s)).    Radiology Studies: Dg Chest Port 1 View  Result Date: 07/26/2016 CLINICAL DATA:  Dyspnea, respiratory abnormalities, hypertension, emphysema EXAM: PORTABLE CHEST 1 VIEW COMPARISON:  Portable exam 1109 hours compared 07/23/2016 FINDINGS: Enlargement of cardiac silhouette. Tortuous aorta. Mediastinal contours stable. Enlarged central pulmonary arteries questioned. Subsegmental atelectasis at LEFT base. No infiltrate, pleural effusion or pneumothorax. Bones demineralized. IMPRESSION: Enlargement of cardiac silhouette  with question pulmonary arterial hypertension. LEFT basilar atelectasis. Electronically Signed   By: Ulyses Southward M.D.   On: 07/26/2016 11:19   Scheduled Meds: . apixaban  5 mg Oral BID  . atorvastatin  10 mg Oral q1800  . diltiazem  120 mg Oral Daily  . diltiazem  15 mg Intravenous Once  . furosemide  40 mg Oral Daily  . ipratropium-albuterol  3 mL Nebulization Q12H  . latanoprost  1 drop Both Eyes QHS  . levofloxacin  500 mg Oral Daily  . sodium chloride flush  3 mL Intravenous Q12H  . timolol  1 drop Both Eyes BID   Continuous Infusions: . sodium chloride 250 mL (07/24/16 1201)   Pamella Pert, MD, PhD [facility] Pager [phone]  If 7PM-7AM, please contact night-coverage [email or web address] Password [email or web address] 07/26/2016, 1:37 PM

## 2016-07-26 NOTE — Care Management Important Message (Signed)
Important Message  Patient Details  Name: [name] MRN: [id number] Date of Birth: 02/14/1951   Medicare Important Message Given:  Yes    Kyla Balzarine 07/26/2016, 12:27 PM

## 2016-07-26 NOTE — Progress Notes (Addendum)
Patient Name: Vernetta HoneyJudy A Hulsebus Date of Encounter: 07/26/2016  [facility]  [facility] Problem List     Principal Problem:   Acute respiratory failure with hypoxia [facility](HCC) Active Problems:   HTN (hypertension)   Glaucoma   Acute on chronic diastolic heart failure (HCC)   Atrial fibrillation with rapid ventricular response (HCC)   COPD exacerbation (HCC)   Hypercholesterolemia     Subjective   Feels better post cardioversion.  Converted back to AFib last night.  HR is still much better than before.  Inpatient Medications    Scheduled Meds: . apixaban  5 mg Oral BID  . atorvastatin  10 mg Oral q1800  . diltiazem  120 mg Oral Daily  . diltiazem  15 mg Intravenous Once  . ipratropium-albuterol  3 mL Nebulization Q12H  . latanoprost  1 drop Both Eyes QHS  . levofloxacin  500 mg Oral Daily  . sodium chloride flush  3 mL Intravenous Q12H  . timolol  1 drop Both Eyes BID   Continuous Infusions: . sodium chloride 250 mL (07/24/16 1201)   PRN Meds: albuterol, HYDROcodone-acetaminophen, LORazepam, ondansetron (ZOFRAN) IV, sodium chloride flush   Vital Signs    Vitals:   07/25/16 2200 07/25/16 2244 07/26/16 0701 07/26/16 0953  BP: (!) 95/58 97/64 96/64    Pulse: 93 71 82   Resp: 20  20   Temp: 98 F (36.7 C)  97.3 F (36.3 C)   TempSrc: Oral  Oral   SpO2: 97%  95% 95%  Weight:   206 lb 14.4 oz (93.8 kg)   Height:        Intake/Output Summary (Last 24 hours) at 07/26/16 0957 Last data filed at 07/26/16 0929  Gross per 24 hour  Intake             1488 ml  Output             1650 ml  Net             -162 ml   Filed Weights   07/24/16 0557 07/25/16 0458 07/26/16 0701  Weight: 202 lb 8 oz (91.9 kg) 206 lb 8 oz (93.7 kg) 206 lb 14.4 oz (93.8 kg)    Physical Exam   GEN: Well nourished, well developed, in no acute distress.  HEENT: Grossly normal.  Neck: Supple, no JVD, carotid bruits, or masses. Cardiac: RRR, premature beats, Radial right 2+ .    Respiratory:  Respirations regular and unlabored, clear to auscultation bilaterally. GI: Soft, nontender, nondistended, BS + x 4. MS: no deformity or atrophy. Skin: warm and dry, no rash. Neuro:  Strength and sensation are intact. Psych: AAOx3.  Normal affect.  Labs    CBC  Recent Labs  07/23/16 1416  07/25/16 0427 07/26/16 0320  WBC 8.2  < > 14.2* 8.8  NEUTROABS 5.7  --   --   --   HGB 16.5*  < > 14.7 14.3  HCT 49.9*  < > 45.4 45.4  MCV 93.8  < > 94.8 96.2  PLT 151  < > 153 152  < > = values in this interval not displayed. Basic Metabolic Panel  Recent Labs  07/24/16 0044 07/25/16 0427 07/26/16 0320  NA 141 136 139  K 4.5 4.0 4.0  CL 102 101 105  CO2 29 24 26   GLUCOSE 164* 116* 94  BUN 23* 55* 46*  CREATININE 1.13* 1.75* 1.11*  CALCIUM 9.9 8.9 9.0  MG 2.0  --   --  Liver Function Tests  Recent Labs  07/23/16 1416  AST 25  ALT 19  ALKPHOS 72  BILITOT 0.7  PROT 6.6  ALBUMIN 3.8   No results for input(s): LIPASE, AMYLASE in the last 72 hours. Cardiac Enzymes No results for input(s): CKTOTAL, CKMB, CKMBINDEX, TROPONINI in the last 72 hours. BNP Invalid input(s): POCBNP D-Dimer No results for input(s): DDIMER in the last 72 hours. Hemoglobin A1C No results for input(s): HGBA1C in the last 72 hours. Fasting Lipid Panel  Recent Labs  07/24/16 0044  CHOL 205*  HDL 64  LDLCALC 131*  TRIG 50  CHOLHDL 3.2   Thyroid Function Tests No results for input(s): TSH, T4TOTAL, T3FREE, THYROIDAB in the last 72 hours.  Invalid input(s): FREET3  Telemetry    AFib, rate controlled- Personally Reviewed  ECG    AFib, rate controlled - Personally Reviewed  Radiology    No results found.  Cardiac Studies   Normal EF by echo  Patient Profile     65 y/o s/p cardioversion for AFib  Assessment & Plan    1)  Eliquis 5 mg BID  2) Follow renal function.  Cr back to normal.  OK to resume diuretic today.  3) reports productive cough.  WBC  increased.  Will defer to IM.  Still on [place] oxygen.  She was told she does not have COPD by her pulmonologist.  She does have a smoking history.  Now that rate is controlled and diuresis resumed, hopefully, she can get off of oxygen via [place].  Changed diltiazem to oral 120 mg CD.  Cannot increase due to borderline BP.  May need to stop or decrease irbesartan if BP stays low on Diltiazem.   If she maintains rate control, could consider d/c from a cardiac standpoint.  We spoke about antiarrhythmic therapy.  At this point, she is without symptoms from her AFib.  WOuld hold off on antiarrhythmic drugs.  She does need long term anticoagulation.    Signed, Lance Muss, MD  07/26/2016, 9:57 AM

## 2016-07-27 DIAGNOSIS — J441 Chronic obstructive pulmonary disease with (acute) exacerbation: Secondary | ICD-10-CM

## 2016-07-27 LAB — BASIC METABOLIC PANEL
Anion gap: 11 (ref 5–15)
BUN: 28 mg/dL — AB (ref 6–20)
CALCIUM: 9.2 mg/dL (ref 8.9–10.3)
CO2: 29 mmol/L (ref 22–32)
CREATININE: 1.03 mg/dL — AB (ref 0.44–1.00)
Chloride: 101 mmol/L (ref 101–111)
GFR calc Af Amer: >60 mL/min (ref >60)
GFR, EST NON AFRICAN AMERICAN: 55 mL/min — AB (ref >60)
Glucose, Bld: 123 mg/dL — ABNORMAL HIGH (ref 65–99)
POTASSIUM: 4.2 mmol/L (ref 3.5–5.1)
SODIUM: 141 mmol/L (ref 135–145)

## 2016-07-27 LAB — CBC
HEMATOCRIT: 49.1 % — AB (ref 36.0–46.0)
Hemoglobin: 15.6 g/dL — ABNORMAL HIGH (ref 12.0–15.0)
MCH: 30.5 pg (ref 26.0–34.0)
MCHC: 31.8 g/dL (ref 30.0–36.0)
MCV: 95.9 fL (ref 78.0–100.0)
PLATELETS: 166 10*3/uL (ref 150–400)
RBC: 5.12 MIL/uL — ABNORMAL HIGH (ref 3.87–5.11)
RDW: 14.7 % (ref 11.5–15.5)
WBC: 9.7 10*3/uL (ref 4.0–10.5)

## 2016-07-27 MED ORDER — ATORVASTATIN CALCIUM 10 MG PO TABS: 10.0000 mg | ORAL_TABLET | Freq: Every day | ORAL | 0 refills | Status: DC

## 2016-07-27 MED ORDER — FUROSEMIDE 20 MG PO TABS: 40.0000 mg | ORAL_TABLET | Freq: Every day | ORAL | 0 refills | Status: DC

## 2016-07-27 MED ORDER — DILTIAZEM HCL ER COATED BEADS 180 MG PO CP24: 180.0000 mg | ORAL_CAPSULE | Freq: Every day | ORAL | 0 refills | Status: DC

## 2016-07-27 MED ORDER — LEVOFLOXACIN 500 MG PO TABS: 500.0000 mg | ORAL_TABLET | Freq: Every day | ORAL | 0 refills | Status: AC

## 2016-07-27 MED ORDER — APIXABAN 5 MG PO TABS: 5.0000 mg | ORAL_TABLET | Freq: Two times a day (BID) | ORAL | 0 refills | Status: DC

## 2016-07-27 MED ORDER — DILTIAZEM HCL ER COATED BEADS 180 MG PO CP24: 180.0000 mg | ORAL_CAPSULE | Freq: Every day | ORAL | Status: DC

## 2016-07-27 MED ORDER — DILTIAZEM HCL 30 MG PO TABS
30.0000 mg | ORAL_TABLET | Freq: Three times a day (TID) | ORAL | Status: DC
  Administered 2016-07-27: 30 mg via ORAL
  Filled 2016-07-27: qty 1

## 2016-07-27 NOTE — Progress Notes (Signed)
Patient Name: [name] Date of Encounter: 07/27/2016  Primary Cardiologist: [facility] Problem List     Principal Problem:   Acute respiratory failure with hypoxia [facility]) Active Problems:   HTN (hypertension)   Glaucoma   Acute on chronic diastolic heart failure (HCC)   Atrial fibrillation with rapid ventricular response (HCC)   COPD exacerbation (HCC)   Hypercholesterolemia     Subjective   In a-fib with RVR - artifact overnight on the monitor incorrectly labelled NSVT. BP remains low normal.  Inpatient Medications    Scheduled Meds: . apixaban  5 mg Oral BID  . atorvastatin  10 mg Oral q1800  . diltiazem  120 mg Oral Daily  . diltiazem  15 mg Intravenous Once  . furosemide  40 mg Oral Daily  . ipratropium-albuterol  3 mL Nebulization Q12H  . latanoprost  1 drop Both Eyes QHS  . levofloxacin  500 mg Oral Daily  . sodium chloride flush  3 mL Intravenous Q12H  . timolol  1 drop Both Eyes BID   Continuous Infusions: . sodium chloride 250 mL (07/24/16 1201)   PRN Meds: albuterol, HYDROcodone-acetaminophen, LORazepam, ondansetron (ZOFRAN) IV, sodium chloride flush   Vital Signs    Vitals:   07/26/16 2107 07/27/16 0522 07/27/16 0848 07/27/16 0955  BP:  (!) 101/57 125/78   Pulse:  91 99   Resp:  18 20   Temp:  98 F (36.7 C) 98 F (36.7 C)   TempSrc:  Oral Oral   SpO2: 95% 95% 94% 96%  Weight:  204 lb 3.2 oz (92.6 kg)    Height:        Intake/Output Summary (Last 24 hours) at 07/27/16 1103 Last data filed at 07/27/16 0800  Gross per 24 hour  Intake             1320 ml  Output             3000 ml  Net            -1680 ml   Filed Weights   07/25/16 0458 07/26/16 0701 07/27/16 0522  Weight: 206 lb 8 oz (93.7 kg) 206 lb 14.4 oz (93.8 kg) 204 lb 3.2 oz (92.6 kg)    Physical Exam   GEN: Well nourished, well developed, in no acute distress.  HEENT: Grossly normal.  Neck: Supple, no JVD, carotid bruits, or masses. Cardiac: RRR, premature  beats, Radial right 2+ .  Respiratory:  Respirations regular and unlabored, clear to auscultation bilaterally. GI: Soft, nontender, nondistended, BS + x 4. MS: no deformity or atrophy. Skin: warm and dry, no rash. Neuro:  Strength and sensation are intact. Psych: AAOx3.  Normal affect.  Labs    CBC  Recent Labs  07/26/16 0320 07/27/16 0330  WBC 8.8 9.7  HGB 14.3 15.6*  HCT 45.4 49.1*  MCV 96.2 95.9  PLT 152 166   Basic Metabolic Panel  Recent Labs  07/26/16 0320 07/27/16 0330  NA 139 141  K 4.0 4.2  CL 105 101  CO2 26 29  GLUCOSE 94 123*  BUN 46* 28*  CREATININE 1.11* 1.03*  CALCIUM 9.0 9.2   Liver Function Tests No results for input(s): AST, ALT, ALKPHOS, BILITOT, PROT, ALBUMIN in the last 72 hours. No results for input(s): LIPASE, AMYLASE in the last 72 hours. Cardiac Enzymes No results for input(s): CKTOTAL, CKMB, CKMBINDEX, TROPONINI in the last 72 hours. BNP Invalid input(s): POCBNP D-Dimer No results for input(s): DDIMER  in the last 72 hours. Hemoglobin A1C No results for input(s): HGBA1C in the last 72 hours. Fasting Lipid Panel No results for input(s): CHOL, HDL, LDLCALC, TRIG, CHOLHDL, LDLDIRECT in the last 72 hours. Thyroid Function Tests No results for input(s): TSH, T4TOTAL, T3FREE, THYROIDAB in the last 72 hours.  Invalid input(s): FREET3  Telemetry    AFib, rate controlled- Personally Reviewed  ECG    AFib, rate controlled - Personally Reviewed  Radiology    Dg Chest Port 1 View  Result Date: 07/26/2016 CLINICAL DATA:  Dyspnea, respiratory abnormalities, hypertension, emphysema EXAM: PORTABLE CHEST 1 VIEW COMPARISON:  Portable exam 1109 hours compared 07/23/2016 FINDINGS: Enlargement of cardiac silhouette. Tortuous aorta. Mediastinal contours stable. Enlarged central pulmonary arteries questioned. Subsegmental atelectasis at LEFT base. No infiltrate, pleural effusion or pneumothorax. Bones demineralized. IMPRESSION: Enlargement of  cardiac silhouette with question pulmonary arterial hypertension. LEFT basilar atelectasis. Electronically Signed   By: Ulyses Southward M.D.   On: 07/26/2016 11:19    Cardiac Studies   Normal EF by echo  Patient Profile     65 y/o s/p cardioversion for AFib  Assessment & Plan    1)  Eliquis 5 mg BID  2) Follow renal function.  Cr back to normal.  OK to resume diuretic today.  3) reports productive cough.  WBC increased.  Will defer to IM.  Still on [place] oxygen.  She was told she does not have COPD by her pulmonologist.  She does have a smoking history.  Now that rate is controlled and diuresis resumed, hopefully, she can get off of oxygen via [place].  4) A-fib : Changed diltiazem to oral 180 mg CD starting tomorrow. Give additional 30 mg cardizem for HR control today. Monitor for better rate control.  Chrystie Nose, MD, [facility] Attending Cardiologist [facility]  Chrystie Nose, MD  07/27/2016, 11:03 AM

## 2016-07-27 NOTE — Discharge Summary (Addendum)
Physician Discharge Summary  [name] ENM:[id number] DOB: July 01, 1951 DOA: 07/23/2016  PCP: [name], MD  Admit date: 07/23/2016 Discharge date: 07/27/2016  Admitted From:home Disposition:home   Recommendations for Outpatient Follow-up:  1. Follow up with PCP in 1-2 weeks 2. Please obtain BMP/CBC in one week   Home Health:no Equipment/Devices:no Discharge Condition:stable CODE STATUS:full Diet recommendation:heart healthy  Brief/Interim Summary:65 yo female with history of HTN, HLD, long-term tobacco abuse (quit 5 years ago), emphysema, and glaucoma. She was sent to the ED 11/28 from her pulmonology office for hypoxemia and AFib. Reports 1 month of worsening intermittent dyspnea and associated cough with white sputum production. Found to be in no distress, SpO2 80s on arrival, SpO2 upper 90s with 2L O2, and AFib RVR in the 120s. Admitted for further evaluation and treatment.  Acute respiratory failure with hypoxia - Likely secondary to new AFib RVR in the setting of AOC diastolic CHF (ECHO 01/2016). - CXR shows hyperinflation with stable chronic bronchitic changes. No consolidation. Recent PFT suggests a restrictive pattern. - Recommend outpatient sleep study. -Currently on oral Levaquin for possible underlying acute bronchitis. Patient clinically feels better. - This morning patient was off oxygen maintaining saturation. She was sitting on bed reporting feeling better and eager to go home today.  Atrial Fibrillation with RVR - New diagnosis but thought to be present since symptom onset ~1 month ago.  - ECHO 01/2016 showed grade 1 diastolic dysfunction, EF 60-65%, normal wall motion, mildly dilated R atrium and ventricle. - BNP 134 on admission. - s/p DCCV 11/30, back in A fib last night -Started eliquis for systemic anticoagulation. Increased the dose of diltiazem for better rate control. I discussed with Dr. Rennis Golden personally, ok to discharge home today with outpatient  follow up.  AKI -Serum creatinine level improved. Resumed Lasix. ARB discontinued. Advised to monitor lab with PCP in 1-2 weeks.  Hypertension -Blood pressure acceptable in current regimen. Advised to monitor blood pressure home and follow-up with PCP and cardiologist.  Glaucoma - Chronic, stable.   Hyperlipidemia - Cholesterol 205, LDL 131 - Continue lipitor.  Discharge Diagnoses:  Principal Problem:   Acute respiratory failure with hypoxia (HCC) Active Problems:   HTN (hypertension)   Glaucoma   Acute on chronic diastolic heart failure (HCC)   Atrial fibrillation with rapid ventricular response (HCC)   COPD exacerbation (HCC)   Hypercholesterolemia    Discharge Instructions  Discharge Instructions    Call MD for:  difficulty breathing, headache or visual disturbances    Complete by:  As directed    Call MD for:  extreme fatigue    Complete by:  As directed    Call MD for:  hives    Complete by:  As directed    Call MD for:  persistant dizziness or light-headedness    Complete by:  As directed    Call MD for:  persistant nausea and vomiting    Complete by:  As directed    Call MD for:  severe uncontrolled pain    Complete by:  As directed    Call MD for:  temperature >100.4    Complete by:  As directed    Diet - low sodium heart healthy    Complete by:  As directed    Discharge instructions    Complete by:  As directed    Please monitor blood pressure at home   Increase activity slowly    Complete by:  As directed  Medication List    STOP taking these medications   guaiFENesin 600 MG 12 hr tablet Commonly known as:  MUCINEX   irbesartan 150 MG tablet Commonly known as:  AVAPRO   nebivolol 10 MG tablet Commonly known as:  BYSTOLIC   simvastatin 20 MG tablet Commonly known as:  ZOCOR     TAKE these medications   apixaban 5 MG Tabs tablet Commonly known as:  ELIQUIS Take 1 tablet (5 mg total) by mouth 2 (two) times daily.    atorvastatin 10 MG tablet Commonly known as:  LIPITOR Take 1 tablet (10 mg total) by mouth daily at 6 PM.   diltiazem 180 MG 24 hr capsule Commonly known as:  CARDIZEM CD Take 1 capsule (180 mg total) by mouth daily. Start taking on:  07/28/2016   furosemide 20 MG tablet Commonly known as:  LASIX Take 2 tablets (40 mg total) by mouth daily. What changed:  how much to take   HYDROcodone-acetaminophen 5-325 MG tablet Commonly known as:  NORCO/VICODIN TK 1 T PO BID PRN FOR PAIN   latanoprost 0.005 % ophthalmic solution Commonly known as:  XALATAN Place 1 drop into both eyes at bedtime.   levofloxacin 500 MG tablet Commonly known as:  LEVAQUIN Take 1 tablet (500 mg total) by mouth daily. Start taking on:  07/28/2016   LORazepam 1 MG tablet Commonly known as:  ATIVAN TAKE 1 TABLET BY MOUTH TID PRN ANXIETY   Melatonin 5 MG Tabs Take 1 tablet by mouth at bedtime as needed.   multivitamin capsule Take 1 capsule by mouth daily.   PROAIR HFA 108 (90 Base) MCG/ACT inhaler Generic drug:  albuterol INHALE 2 PUFFS INTO THE LUNGS Q 4 HOURS PRN   timolol 0.5 % ophthalmic solution Commonly known as:  TIMOPTIC INSTILL 1 DROP IN BOTH EYES BID      Follow-up Information    Johny BlamerHARRIS, WILLIAM, MD. Schedule an appointment as soon as possible for a visit in 1 week(s).   Specialty:  Family Medicine Contact information: [phone] [address] [place] [address] [phone]        Lance MussJayadeep [name], MD. Schedule an appointment as soon as possible for a visit in 2 week(s).   Specialties:  Cardiology, Radiology, Interventional Cardiology Contact information: [address] [place] [address] [phone]          Allergies  Allergen Reactions  . Percocet [Oxycodone-Acetaminophen] Itching    Consultations: Cardiologist  Procedures/Studies:DCCV 11/30,   Subjective: Patient was seen and examined at bedside. Patient reported feeling much  better. She was not using oxygen. Denied shortness of breath, chest pain, nausea, vomiting, abdominal pain. Eager to go home today. No dizziness, lightheadedness.   Discharge Exam: Vitals:   07/27/16 0522 07/27/16 0848  BP: (!) 101/57 125/78  Pulse: 91 99  Resp: 18 20  Temp: 98 F (36.7 C) 98 F (36.7 C)   Vitals:   07/26/16 2107 07/27/16 0522 07/27/16 0848 07/27/16 0955  BP:  (!) 101/57 125/78   Pulse:  91 99   Resp:  18 20   Temp:  98 F (36.7 C) 98 F (36.7 C)   TempSrc:  Oral Oral   SpO2: 95% 95% 94% 96%  Weight:  92.6 kg (204 lb 3.2 oz)    Height:        General: Pt is alert, awake, not in acute distress Cardiovascular: RRR, S1/S2 +, no rubs, no gallops Respiratory: CTA bilaterally, no wheezing, no  rhonchi Abdominal: Soft, NT, ND, bowel sounds + Extremities: no edema, no cyanosis Psych: Alert awake oriented 3. Musculoskeletal: Muscular strength 5 over 5 in all extremities.   The results of significant diagnostics from this hospitalization (including imaging, microbiology, ancillary and laboratory) are listed below for reference.     Microbiology: No results found for this or any previous visit (from the past 240 hour(s)).   Labs: BNP (last 3 results)  Recent Labs  07/23/16 1416  BNP 134.3*   Basic Metabolic Panel:  Recent Labs Lab 07/23/16 1416 07/24/16 0044 07/25/16 0427 07/26/16 0320 07/27/16 0330  NA 141 141 136 139 141  K 4.1 4.5 4.0 4.0 4.2  CL 108 102 101 105 101  CO2 26 29 24 26 29   GLUCOSE 132* 164* 116* 94 123*  BUN 18 23* 55* 46* 28*  CREATININE 0.98 1.13* 1.75* 1.11* 1.03*  CALCIUM 9.8 9.9 8.9 9.0 9.2  MG  --  2.0  --   --   --    Liver Function Tests:  Recent Labs Lab 07/23/16 1416  AST 25  ALT 19  ALKPHOS 72  BILITOT 0.7  PROT 6.6  ALBUMIN 3.8   No results for input(s): LIPASE, AMYLASE in the last 168 hours. No results for input(s): AMMONIA in the last 168 hours. CBC:  Recent Labs Lab 07/23/16 1416  07/24/16 0044 07/25/16 0427 07/26/16 0320 07/27/16 0330  WBC 8.2 7.8 14.2* 8.8 9.7  NEUTROABS 5.7  --   --   --   --   HGB 16.5* 16.9* 14.7 14.3 15.6*  HCT 49.9* 51.1* 45.4 45.4 49.1*  MCV 93.8 94.8 94.8 96.2 95.9  PLT 151 172 153 152 166   Cardiac Enzymes: No results for input(s): CKTOTAL, CKMB, CKMBINDEX, TROPONINI in the last 168 hours. BNP: Invalid input(s): POCBNP CBG: No results for input(s): GLUCAP in the last 168 hours. D-Dimer No results for input(s): DDIMER in the last 72 hours. Hgb A1c No results for input(s): HGBA1C in the last 72 hours. Lipid Profile No results for input(s): CHOL, HDL, LDLCALC, TRIG, CHOLHDL, LDLDIRECT in the last 72 hours. Thyroid function studies No results for input(s): TSH, T4TOTAL, T3FREE, THYROIDAB in the last 72 hours.  Invalid input(s): FREET3 Anemia work up No results for input(s): VITAMINB12, FOLATE, FERRITIN, TIBC, IRON, RETICCTPCT in the last 72 hours. Urinalysis    Component Value Date/Time   COLORURINE YELLOW 07/23/2016 1641   APPEARANCEUR CLEAR 07/23/2016 1641   LABSPEC 1.021 07/23/2016 1641   PHURINE 6.0 07/23/2016 1641   GLUCOSEU NEGATIVE 07/23/2016 1641   HGBUR NEGATIVE 07/23/2016 1641   BILIRUBINUR NEGATIVE 07/23/2016 1641   KETONESUR NEGATIVE 07/23/2016 1641   PROTEINUR 30 (A) 07/23/2016 1641   NITRITE NEGATIVE 07/23/2016 1641   LEUKOCYTESUR MODERATE (A) 07/23/2016 1641   Sepsis Labs Invalid input(s): PROCALCITONIN,  WBC,  LACTICIDVEN Microbiology No results found for this or any previous visit (from the past 240 hour(s)).   Time coordinating discharge: 26 minutes  SIGNED:   Maxie Barb, MD  [facility] 07/27/2016, 11:48 AM  If 7PM-7AM, please contact night-coverage [email or web address] Password [email or web address]

## 2016-07-27 NOTE — Progress Notes (Signed)
Patient alert and oriented, denies pain, no shortness of breath, v/s stable,iv and tele d/c , d/c instruction explain and given to the patient, all questions answered, patient verbalized understanding. D/c patient home per order.

## 2016-07-30 ENCOUNTER — Telehealth: Payer: Self-pay | Admitting: Interventional Cardiology

## 2016-07-30 ENCOUNTER — Telehealth: Payer: Self-pay

## 2016-07-30 NOTE — Telephone Encounter (Signed)
Spoke with patient about her Eliquis. She was discharged on 12/02 and has been without this med. I have provided 2 boxes of samples and coupon for a 30 day free supply. I also advised her I would do a PA to get this covered. She is to followup in 2 weeks with Dr. [name].

## 2016-07-30 NOTE — Telephone Encounter (Signed)
Pt was put on Eliquis in the hospital, can't afford it, pt asking if we can change to another blood thinner, give her samples, or contact her insurance to see if they would cover it? pls advise [phone]

## 2016-07-30 NOTE — Telephone Encounter (Signed)
Prior Auth for Eliquis submitted to [employer].

## 2016-08-16 ENCOUNTER — Ambulatory Visit (INDEPENDENT_AMBULATORY_CARE_PROVIDER_SITE_OTHER): Payer: Medicare Other | Admitting: Interventional Cardiology

## 2016-08-16 ENCOUNTER — Encounter: Payer: Self-pay | Admitting: Interventional Cardiology

## 2016-08-16 ENCOUNTER — Encounter (INDEPENDENT_AMBULATORY_CARE_PROVIDER_SITE_OTHER): Payer: Self-pay

## 2016-08-16 VITALS — BP 140/72 | HR 94 | Ht 60.0 in | Wt 210.2 lb

## 2016-08-16 DIAGNOSIS — I4891 Unspecified atrial fibrillation: Secondary | ICD-10-CM

## 2016-08-16 DIAGNOSIS — Z7901 Long term (current) use of anticoagulants: Secondary | ICD-10-CM | POA: Diagnosis not present

## 2016-08-16 DIAGNOSIS — I10 Essential (primary) hypertension: Secondary | ICD-10-CM | POA: Diagnosis not present

## 2016-08-16 DIAGNOSIS — I5033 Acute on chronic diastolic (congestive) heart failure: Secondary | ICD-10-CM | POA: Diagnosis not present

## 2016-08-16 MED ORDER — POTASSIUM CHLORIDE CRYS ER 20 MEQ PO TBCR: 20.0000 meq | EXTENDED_RELEASE_TABLET | Freq: Every day | ORAL | 3 refills | Status: DC

## 2016-08-16 MED ORDER — FUROSEMIDE 40 MG PO TABS: 40.0000 mg | ORAL_TABLET | Freq: Every day | ORAL | 3 refills | Status: DC

## 2016-08-16 MED ORDER — DILTIAZEM HCL ER COATED BEADS 240 MG PO CP24: 240.0000 mg | ORAL_CAPSULE | Freq: Every day | ORAL | 3 refills | Status: DC

## 2016-08-16 NOTE — Progress Notes (Signed)
Cardiology Office Note   Date:  08/16/2016   ID:  [name], DOB [birth year], MRN [id number]  PCP:  [name], MD    No chief complaint on file. AFib   Wt Readings from Last 3 Encounters:  08/16/16 210 lb 3.2 oz (95.3 kg)  07/27/16 204 lb 3.2 oz (92.6 kg)  07/23/16 204 lb (92.5 kg)       History of Present Illness: [name] is a 65 y.o. female  with a history of tobacco abuse. She quit smoking several years ago. She carries a diagnosis of emphysema although most recent pulmonary function test mention restrictive disease. She was seeing her pulmonologist for dyspnea on exertion. An irregular heartbeat was deteceted. ECG was done showing atrial fibrillation in 11/17.  She was admittted with CHF as well at that time.  She had a TEE/CV. She went back to AFib that same night.    She occasionally feels her heart rhythm. She has had some shortness of breath as well. She notes increased leg edema.     Past Medical History:  Diagnosis Date  . Emphysema of lung (HCC)   . Hypercholesterolemia   . Hypertension   . Osteoarthritis     Past Surgical History:  Procedure Laterality Date  . CARDIOVERSION N/A 07/25/2016   Procedure: CARDIOVERSION;  Surgeon: Pricilla Riffle, MD;  Location: [facility] ENDOSCOPY;  Service: Cardiovascular;  Laterality: N/A;  . CATARACT EXTRACTION Bilateral   . LEG SURGERY Right   . TEE WITHOUT CARDIOVERSION N/A 07/25/2016   Procedure: TRANSESOPHAGEAL ECHOCARDIOGRAM (TEE);  Surgeon: Pricilla Riffle, MD;  Location: [facility] ENDOSCOPY;  Service: Cardiovascular;  Laterality: N/A;     Current Outpatient Prescriptions  Medication Sig Dispense Refill  . apixaban (ELIQUIS) 5 MG TABS tablet Take 1 tablet (5 mg total) by mouth 2 (two) times daily. 60 tablet 0  . atorvastatin (LIPITOR) 10 MG tablet Take 1 tablet (10 mg total) by mouth daily at 6 PM. 30 tablet 0  . diltiazem (CARDIZEM CD) 180 MG 24 hr capsule Take 1 capsule (180 mg total) by mouth daily. 30  capsule 0  . furosemide (LASIX) 20 MG tablet Take 2 tablets (40 mg total) by mouth daily. 30 tablet 0  . HYDROcodone-acetaminophen (NORCO/VICODIN) 5-325 MG tablet Take 1 tablet by mouth every 6 (six) hours as needed for moderate pain.    Marland Kitchen latanoprost (XALATAN) 0.005 % ophthalmic solution Place 1 drop into both eyes at bedtime.    Marland Kitchen LORazepam (ATIVAN) 1 MG tablet TAKE 1 TABLET BY MOUTH TID PRN ANXIETY  1  . Multiple Vitamin (MULTIVITAMIN) capsule Take 1 capsule by mouth daily.    Marland Kitchen PROAIR HFA 108 (90 Base) MCG/ACT inhaler INHALE 2 PUFFS INTO THE LUNGS Q 4 HOURS PRN  0  . timolol (TIMOPTIC) 0.5 % ophthalmic solution INSTILL 1 DROP IN BOTH EYES BID  3   No current facility-administered medications for this visit.     Allergies:   Percocet [oxycodone-acetaminophen] and Levaquin [levofloxacin in d5w]    Social History:  The patient  reports that she quit smoking about 5 years ago. Her smoking use included Cigarettes. She started smoking about 50 years ago. She has a 43.00 pack-year smoking history. She has never used smokeless tobacco. She reports that she does not drink alcohol or use drugs.   Family History:  The patient's family history is not on file. No h/o stroke   ROS:  Please see the history of present illness.  Otherwise, review of systems are positive for leg swelling.   All other systems are reviewed and negative.    PHYSICAL EXAM: VS:  BP 140/72   Pulse 94   Ht 5' (1.524 m)   Wt 210 lb 3.2 oz (95.3 kg)   BMI 41.05 kg/m  , BMI Body mass index is 41.05 kg/m. GEN: Well nourished, well developed, in no acute distress  HEENT: normal  Neck: no JVD, carotid bruits, or masses Cardiac: RRR; no murmurs, rubs, or gallops,; bilateral pedal edema  Respiratory:  No wheezing, mildly increased work of breathing GI: soft, nontender, nondistended, + BS MS: no deformity or atrophy  Skin: warm and dry, no rash Neuro:  Strength and sensation are intact Psych: euthymic mood, full  affect   EKG:   The ekg ordered today demonstrates AFib, rate controlled   Recent Labs: 06/25/2016: Pro B Natriuretic peptide (BNP) 141.0; TSH 1.53 07/23/2016: ALT 19; B Natriuretic Peptide 134.3 07/24/2016: Magnesium 2.0 07/27/2016: BUN 28; Creatinine, Ser 1.03; Hemoglobin 15.6; Platelets 166; Potassium 4.2; Sodium 141   Lipid Panel    Component Value Date/Time   CHOL 205 (H) 07/24/2016 0044   TRIG 50 07/24/2016 0044   HDL 64 07/24/2016 0044   CHOLHDL 3.2 07/24/2016 0044   VLDL 10 07/24/2016 0044   LDLCALC 131 (H) 07/24/2016 0044     Other studies Reviewed: Additional studies/ records that were reviewed today with results demonstrating: Hospital records.   ASSESSMENT AND PLAN:  1. AFib: Increase cardizem to 240 mg daily for better rate control.  2. Acute on chronic diastolic heart failure: Increase Lasix to 40 mg twice a day. This should help with fluid retention in her breathing.  Avoid excess salt in diet. 3. Tolerating anticoagulation, Eliquis for stroke prevention. 4. Once her fluid status is improved, could consider antiarrhythmic such as flecainide. No history of ischemic heart disease. We discussed with electrophysiology as well. Could also try repeat cardioversion after antiarrhythmic is initiated. I think although her palpitations are not bothersome, she has a better chance of staying out of heart failure if she maintains sinus rhythm.   Current medicines are reviewed at length with the patient today.  The patient concerns regarding her medicines were addressed.  The following changes have been made:  As above  Labs/ tests ordered today include:  No orders of the defined types were placed in this encounter.   Recommend 150 minutes/week of aerobic exercise Low fat, low carb, high fiber diet recommended  Disposition:   FU in 1 week   Signed, Lance MussJayadeep [name], MD  08/16/2016 2:10 PM    [facility] [address], [place], [place]   [address] Phone: [phone]) [phone]; Fax: [phone]

## 2016-08-16 NOTE — Patient Instructions (Signed)
Medication Instructions:  Please increase your Furosemide to 40 mg twice a day for 3 days then take 40 mg a day thereafter. Start Potassium Chl 20 MEQ a day. Increase Diltiazem to 240 mg a day. Continue all other medications as listed.  Follow-Up: Follow up in 1 week with APP.  If you need a refill on your cardiac medications before your next appointment, please call your pharmacy.  Thank you for choosing [facility]!!

## 2016-08-27 ENCOUNTER — Ambulatory Visit: Payer: Medicare Other | Admitting: Cardiology

## 2016-09-05 ENCOUNTER — Inpatient Hospital Stay (HOSPITAL_COMMUNITY)
Admission: AD | Admit: 2016-09-05 | Discharge: 2016-09-15 | DRG: 308 | Disposition: A | Discharge status: Home / Self Care | Payer: Medicare Other | Source: Ambulatory Visit | Attending: Cardiovascular Disease | Admitting: Cardiovascular Disease

## 2016-09-05 ENCOUNTER — Encounter: Payer: Self-pay | Admitting: Cardiology

## 2016-09-05 ENCOUNTER — Ambulatory Visit (INDEPENDENT_AMBULATORY_CARE_PROVIDER_SITE_OTHER): Payer: Medicare Other | Admitting: Cardiology

## 2016-09-05 VITALS — BP 102/78 | HR 92 | Ht 60.0 in | Wt 217.0 lb

## 2016-09-05 DIAGNOSIS — I4891 Unspecified atrial fibrillation: Secondary | ICD-10-CM | POA: Diagnosis present

## 2016-09-05 DIAGNOSIS — Z79899 Other long term (current) drug therapy: Secondary | ICD-10-CM | POA: Diagnosis not present

## 2016-09-05 DIAGNOSIS — I5033 Acute on chronic diastolic (congestive) heart failure: Secondary | ICD-10-CM | POA: Diagnosis not present

## 2016-09-05 DIAGNOSIS — J439 Emphysema, unspecified: Secondary | ICD-10-CM | POA: Diagnosis present

## 2016-09-05 DIAGNOSIS — E876 Hypokalemia: Secondary | ICD-10-CM | POA: Diagnosis not present

## 2016-09-05 DIAGNOSIS — I5030 Unspecified diastolic (congestive) heart failure: Secondary | ICD-10-CM | POA: Diagnosis not present

## 2016-09-05 DIAGNOSIS — I481 Persistent atrial fibrillation: Secondary | ICD-10-CM | POA: Diagnosis not present

## 2016-09-05 DIAGNOSIS — E7800 Pure hypercholesterolemia, unspecified: Secondary | ICD-10-CM | POA: Diagnosis present

## 2016-09-05 DIAGNOSIS — I4892 Unspecified atrial flutter: Secondary | ICD-10-CM | POA: Diagnosis present

## 2016-09-05 DIAGNOSIS — R0602 Shortness of breath: Secondary | ICD-10-CM

## 2016-09-05 DIAGNOSIS — Z888 Allergy status to other drugs, medicaments and biological substances status: Secondary | ICD-10-CM

## 2016-09-05 DIAGNOSIS — I10 Essential (primary) hypertension: Secondary | ICD-10-CM | POA: Diagnosis not present

## 2016-09-05 DIAGNOSIS — M1990 Unspecified osteoarthritis, unspecified site: Secondary | ICD-10-CM | POA: Diagnosis present

## 2016-09-05 DIAGNOSIS — R0600 Dyspnea, unspecified: Secondary | ICD-10-CM

## 2016-09-05 DIAGNOSIS — E785 Hyperlipidemia, unspecified: Secondary | ICD-10-CM | POA: Diagnosis not present

## 2016-09-05 DIAGNOSIS — Z79891 Long term (current) use of opiate analgesic: Secondary | ICD-10-CM

## 2016-09-05 DIAGNOSIS — Z87891 Personal history of nicotine dependence: Secondary | ICD-10-CM | POA: Diagnosis not present

## 2016-09-05 DIAGNOSIS — I110 Hypertensive heart disease with heart failure: Secondary | ICD-10-CM | POA: Diagnosis present

## 2016-09-05 DIAGNOSIS — R0609 Other forms of dyspnea: Secondary | ICD-10-CM

## 2016-09-05 DIAGNOSIS — Z7901 Long term (current) use of anticoagulants: Secondary | ICD-10-CM | POA: Diagnosis not present

## 2016-09-05 DIAGNOSIS — I4819 Other persistent atrial fibrillation: Secondary | ICD-10-CM

## 2016-09-05 DIAGNOSIS — Z885 Allergy status to narcotic agent status: Secondary | ICD-10-CM | POA: Diagnosis not present

## 2016-09-05 DIAGNOSIS — I5032 Chronic diastolic (congestive) heart failure: Secondary | ICD-10-CM | POA: Insufficient documentation

## 2016-09-05 DIAGNOSIS — I480 Paroxysmal atrial fibrillation: Secondary | ICD-10-CM | POA: Diagnosis not present

## 2016-09-05 DIAGNOSIS — G4733 Obstructive sleep apnea (adult) (pediatric): Secondary | ICD-10-CM | POA: Diagnosis present

## 2016-09-05 DIAGNOSIS — Z6841 Body Mass Index (BMI) 40.0 and over, adult: Secondary | ICD-10-CM

## 2016-09-05 DIAGNOSIS — J449 Chronic obstructive pulmonary disease, unspecified: Secondary | ICD-10-CM | POA: Diagnosis not present

## 2016-09-05 DIAGNOSIS — I509 Heart failure, unspecified: Secondary | ICD-10-CM | POA: Diagnosis not present

## 2016-09-05 LAB — BRAIN NATRIURETIC PEPTIDE: B Natriuretic Peptide: 94.5 pg/mL (ref 0.0–100.0)

## 2016-09-05 LAB — BASIC METABOLIC PANEL
Anion gap: 7 (ref 5–15)
BUN: 21 mg/dL — ABNORMAL HIGH (ref 6–20)
CALCIUM: 9.2 mg/dL (ref 8.9–10.3)
CO2: 30 mmol/L (ref 22–32)
CREATININE: 1 mg/dL (ref 0.44–1.00)
Chloride: 105 mmol/L (ref 101–111)
GFR calc Af Amer: >60 mL/min (ref >60)
GFR calc non Af Amer: 57 mL/min — ABNORMAL LOW (ref >60)
GLUCOSE: 105 mg/dL — AB (ref 65–99)
Potassium: 3.9 mmol/L (ref 3.5–5.1)
Sodium: 142 mmol/L (ref 135–145)

## 2016-09-05 LAB — TSH: TSH: 2.07 u[IU]/mL (ref 0.350–4.500)

## 2016-09-05 MED ORDER — SODIUM CHLORIDE 0.9 % IV SOLN: 250.0000 mL | INTRAVENOUS | Status: DC | PRN

## 2016-09-05 MED ORDER — ALBUTEROL SULFATE (2.5 MG/3ML) 0.083% IN NEBU: 3.0000 mL | INHALATION_SOLUTION | Freq: Four times a day (QID) | RESPIRATORY_TRACT | Status: DC | PRN

## 2016-09-05 MED ORDER — TIMOLOL MALEATE 0.5 % OP SOLN
1.0000 [drp] | Freq: Every day | OPHTHALMIC | Status: DC
  Administered 2016-09-05 – 2016-09-15 (×11): 1 [drp] via OPHTHALMIC
  Filled 2016-09-05: qty 5

## 2016-09-05 MED ORDER — ATORVASTATIN CALCIUM 10 MG PO TABS
10.0000 mg | ORAL_TABLET | Freq: Every day | ORAL | Status: DC
  Administered 2016-09-05 – 2016-09-14 (×10): 10 mg via ORAL
  Filled 2016-09-05 (×10): qty 1

## 2016-09-05 MED ORDER — FUROSEMIDE 10 MG/ML IJ SOLN
40.0000 mg | Freq: Two times a day (BID) | INTRAMUSCULAR | Status: DC
  Administered 2016-09-05 – 2016-09-06 (×2): 40 mg via INTRAVENOUS
  Filled 2016-09-05 (×2): qty 4

## 2016-09-05 MED ORDER — ACETAMINOPHEN 325 MG PO TABS
650.0000 mg | ORAL_TABLET | ORAL | Status: DC | PRN
  Administered 2016-09-06 – 2016-09-15 (×4): 650 mg via ORAL
  Filled 2016-09-05 (×4): qty 2

## 2016-09-05 MED ORDER — LATANOPROST 0.005 % OP SOLN
1.0000 [drp] | Freq: Every day | OPHTHALMIC | Status: DC
  Administered 2016-09-05 – 2016-09-14 (×10): 1 [drp] via OPHTHALMIC
  Filled 2016-09-05: qty 2.5

## 2016-09-05 MED ORDER — SODIUM CHLORIDE 0.9% FLUSH: 3.0000 mL | INTRAVENOUS | Status: DC | PRN

## 2016-09-05 MED ORDER — SODIUM CHLORIDE 0.9% FLUSH
3.0000 mL | Freq: Two times a day (BID) | INTRAVENOUS | Status: DC
  Administered 2016-09-05 – 2016-09-12 (×15): 3 mL via INTRAVENOUS

## 2016-09-05 MED ORDER — DILTIAZEM HCL ER COATED BEADS 240 MG PO CP24
240.0000 mg | ORAL_CAPSULE | Freq: Every day | ORAL | Status: DC
  Administered 2016-09-06 – 2016-09-15 (×10): 240 mg via ORAL
  Filled 2016-09-05 (×10): qty 1

## 2016-09-05 MED ORDER — ONDANSETRON HCL 4 MG/2ML IJ SOLN: 4.0000 mg | Freq: Four times a day (QID) | INTRAMUSCULAR | Status: DC | PRN

## 2016-09-05 MED ORDER — HYDROCODONE-ACETAMINOPHEN 5-325 MG PO TABS
1.0000 | ORAL_TABLET | Freq: Four times a day (QID) | ORAL | Status: DC | PRN
  Administered 2016-09-05 – 2016-09-15 (×21): 1 via ORAL
  Filled 2016-09-05 (×22): qty 1

## 2016-09-05 MED ORDER — POTASSIUM CHLORIDE CRYS ER 20 MEQ PO TBCR
20.0000 meq | EXTENDED_RELEASE_TABLET | Freq: Every day | ORAL | Status: DC
  Administered 2016-09-05 – 2016-09-09 (×5): 20 meq via ORAL
  Filled 2016-09-05 (×5): qty 1

## 2016-09-05 MED ORDER — APIXABAN 5 MG PO TABS
5.0000 mg | ORAL_TABLET | Freq: Two times a day (BID) | ORAL | Status: DC
  Administered 2016-09-05 – 2016-09-15 (×20): 5 mg via ORAL
  Filled 2016-09-05 (×20): qty 1

## 2016-09-05 NOTE — Patient Instructions (Signed)
YOU ARE GOING TO BE ADMITTED TO [facility]; THE HOSPITAL WILL CALL YOU WHEN TO COME OVER; IT IS OK TO GO HOME AND PACK A BAG AND WAIT FOR THE HOSPITAL TO CALL.

## 2016-09-05 NOTE — Progress Notes (Signed)
09/05/2016 [facility]   [birth year]  [id number]  Primary Physician [name], MD Primary Cardiologist: Dr. [name]    Reason for Visit/CC: Atrial Fibrillation and acute on chronic diastolic HF  HPI:  [name] is a 66 y.o. female  with a history of tobacco abuse. She quit smoking several years ago. Per records, she has no history of ischemic heart disease. She had a 2D echo 01/2016 that showed normal LVEF of 60-65% with grade 1DD. She carries a diagnosis of emphysema although most recent pulmonary function test mention restrictive disease. She was recently seeing her pulmonologist for dyspnea on exertion. An irregular heartbeat was deteceted. ECG was done showing atrial fibrillation in 11/17.  She was admittted with CHF as well at that time.  She had a TEE/CV. She went back to AFib that same night.  She has since been treated with rate control. She is on Cardizem. She is also on chronic anticoagulation with Eliquis.   She was seen by Dr. [name] on 08/16/16 and was still in atrial fibrillation. He increased her Cardizem dose to 240 mg. He also increased her lasix to 40 mg BID given she was volume overloaded. She presents back to clinic for f/u. Dr. [name] recommended initiation of an antiarrythmic (possible flecainide), once her fluid status improved. He discussed with EP and it was determined that initiation with an AAD would be beneficial with a repeat cardioversion if still in atrial fibrillation.  EKG today show continued atrial fibrillation. HR is 84 bpm. BP is 127/90. She remains massively volume overloaded and notes increased dyspnea. Her speech is pressured when she talks. Her dyspnea is worse with exertion. She also notes orthopnea and PND. No chest pain. She reports full medication compliance and avoidance of salt. Despite increasing her lasix to 40 mg BID, she reports reduced urinary output over the last several days. She also notes a productive cough with clear colored  sputum. Of note, patient reports that she was recently diagnosed with severe OSA. She is waiting a CPAP. This is being followed by her PCP.    Current Meds  Medication Sig  . apixaban (ELIQUIS) 5 MG TABS tablet Take 1 tablet (5 mg total) by mouth 2 (two) times daily.  Marland Kitchen atorvastatin (LIPITOR) 10 MG tablet Take 1 tablet (10 mg total) by mouth daily at 6 PM.  . diltiazem (CARDIZEM CD) 240 MG 24 hr capsule Take 1 capsule (240 mg total) by mouth daily.  . furosemide (LASIX) 40 MG tablet Take 1 tablet (40 mg total) by mouth daily.  Marland Kitchen HYDROcodone-acetaminophen (NORCO/VICODIN) 5-325 MG tablet Take 1 tablet by mouth every 6 (six) hours as needed for moderate pain.  Marland Kitchen latanoprost (XALATAN) 0.005 % ophthalmic solution Place 1 drop into both eyes at bedtime.  Marland Kitchen LORazepam (ATIVAN) 1 MG tablet TAKE 1 TABLET BY MOUTH TID PRN ANXIETY  . Multiple Vitamin (MULTIVITAMIN) capsule Take 1 capsule by mouth daily.  . potassium chloride SA (K-DUR,KLOR-CON) 20 MEQ tablet Take 1 tablet (20 mEq total) by mouth daily.  Marland Kitchen PROAIR HFA 108 (90 Base) MCG/ACT inhaler INHALE 2 PUFFS INTO THE LUNGS Q 4 HOURS PRN  . timolol (TIMOPTIC) 0.5 % ophthalmic solution INSTILL 1 DROP IN BOTH EYES BID   Allergies  Allergen Reactions  . Percocet [Oxycodone-Acetaminophen] Itching  . Levaquin [Levofloxacin In D5w] Rash   Past Medical History:  Diagnosis Date  . Emphysema of lung (HCC)   . Hypercholesterolemia   . Hypertension   . Osteoarthritis  Family History  Problem Relation Age of Onset  . Stroke Neg Hx    Past Surgical History:  Procedure Laterality Date  . CARDIOVERSION N/A 07/25/2016   Procedure: CARDIOVERSION;  Surgeon: Pricilla Riffle, MD;  Location: [facility] ENDOSCOPY;  Service: Cardiovascular;  Laterality: N/A;  . CATARACT EXTRACTION Bilateral   . LEG SURGERY Right   . TEE WITHOUT CARDIOVERSION N/A 07/25/2016   Procedure: TRANSESOPHAGEAL ECHOCARDIOGRAM (TEE);  Surgeon: Pricilla Riffle, MD;  Location: [facility] ENDOSCOPY;  Service:  Cardiovascular;  Laterality: N/A;   Social History   Social History  . Marital status: Widowed    Spouse name: N/A  . Number of children: N/A  . Years of education: N/A   Occupational History  . Not on file.   Social History Main Topics  . Smoking status: Former Smoker    Packs/day: 1.00    Years: 43.00    Types: Cigarettes    Start date: 08/26/1966    Quit date: 08/26/2010  . Smokeless tobacco: Never Used  . Alcohol use No  . Drug use: No  . Sexual activity: Not on file   Other Topics Concern  . Not on file   Social History Narrative  . No narrative on file     Review of Systems: General: negative for chills, fever, night sweats or weight changes.  Cardiovascular: negative for chest pain, dyspnea on exertion, edema, orthopnea, palpitations, paroxysmal nocturnal dyspnea or shortness of breath Dermatological: negative for rash Respiratory: negative for cough or wheezing Urologic: negative for hematuria Abdominal: negative for nausea, vomiting, diarrhea, bright red blood per rectum, melena, or hematemesis Neurologic: negative for visual changes, syncope, or dizziness All other systems reviewed and are otherwise negative except as noted above.   Physical Exam:  Blood pressure 102/78, pulse 92, height 5' (1.524 m), weight 217 lb (98.4 kg).  General appearance: alert, cooperative, no distress and moderately obese Neck: no carotid bruit and no JVD Lungs: right sided basilar rales  Heart: irregularly irregular rhythm and regularly irregular rhythm Extremities: 3+ bilateral LEE pitting edema Pulses: 2+ and symmetric Skin: Skin color, texture, turgor normal. No rashes or lesions Neurologic: Grossly normal  EKG Atrial fibrillation w/ CVR 84 bpm   ASSESSMENT AND PLAN:   1. Acute on Chronic Diastolic CHF: patient remains massively volume overloaded and symptomatic with worsening dyspnea at rest and with exertion, despite increasing doses of her home lasix. She also notes  decreased urinary output. Given her progressive symptoms and edema and poor response to PO diuretics, we will plan to admit for IV diuretics. I have discussed plan with Dr. Eden Emms, DOD, who agrees with admission. We will check baseline BMP and will start IV  Lasix 40 mg BID. Daily weights + low sodium diet and strict I/Os. Will repeat 2D echo to see if any changes in systolic function. If change in EF, will stop Cardizem and replace with BB. We will obtain a CXR. Lung exam is abnormal with RLL rales. This may be edema c/w CHF, however given her productive cough, we will need to rule-out PNA as well. F/u BMP in the am to monitor renal function and electrolytes.   2. Atrial Fibrillation: VR is controlled in the 80s on PO Cardizem. Pt failed DCCV in 06/2016. Plan to start an AAD (possibly Flecainide) but will start once her acute CHF resolves. Continue Eliquis for a/c. Possible repeat DCCV after initiation of AAD.     Robbie Lis PA-C 09/05/2016 4:45 PM

## 2016-09-05 NOTE — H&P (Signed)
09/05/2016 [name]   1950-09-20  [id number]  Primary Physician [name], MD Primary Cardiologist: Dr. [name]    Reason for Visit/CC: Atrial Fibrillation and acute on chronic diastolic HF  HPI:  [name] a 66 y.o.femalewith a history of tobacco abuse. She quit smoking several years ago. Per records, she has no history of ischemic heart disease. She had a 2D echo 01/2016 that showed normal LVEF of 60-65% with grade 1DD. She carries a diagnosis of emphysema although most recent pulmonary function test mention restrictive disease. She was recently seeing her pulmonologist for dyspnea on exertion. An irregular heartbeat was deteceted. ECG was done showing atrial fibrillation in 11/17. She was admittted with CHF as well at that time. She had a TEE/CV. She went back to AFib that same night. She has since been treated with rate control. She is on Cardizem. She is also on chronic anticoagulation with Eliquis.   She was seen by Dr. [name] on 08/16/16 and was still in atrial fibrillation. He increased her Cardizem dose to 240 mg. He also increased her lasix to 40 mg BID given she was volume overloaded. She presents back to clinic for f/u. Dr. [name] recommended initiation of an antiarrythmic (possible flecainide), once her fluid status improved. He discussed with EP and it was determined that initiation with an AAD would be beneficial with a repeat cardioversion if still in atrial fibrillation.  EKG today show continued atrial fibrillation. HR is 84 bpm. BP is 127/90. She remains massively volume overloaded and notes increased dyspnea. Her speech is pressured when she talks. Her dyspnea is worse with exertion. She also notes orthopnea and PND. No chest pain. She reports full medication compliance and avoidance of salt. Despite increasing her lasix to 40 mg BID, she reports reduced urinary output over the last several days. She also notes a productive cough with clear  colored sputum. Of note, patient reports that she was recently diagnosed with severe OSA. She is waiting a CPAP. This is being followed by her PCP.    Active Medications      Current Meds  Medication Sig  . apixaban (ELIQUIS) 5 MG TABS tablet Take 1 tablet (5 mg total) by mouth 2 (two) times daily.  Marland Kitchen atorvastatin (LIPITOR) 10 MG tablet Take 1 tablet (10 mg total) by mouth daily at 6 PM.  . diltiazem (CARDIZEM CD) 240 MG 24 hr capsule Take 1 capsule (240 mg total) by mouth daily.  . furosemide (LASIX) 40 MG tablet Take 1 tablet (40 mg total) by mouth daily.  Marland Kitchen HYDROcodone-acetaminophen (NORCO/VICODIN) 5-325 MG tablet Take 1 tablet by mouth every 6 (six) hours as needed for moderate pain.  Marland Kitchen latanoprost (XALATAN) 0.005 % ophthalmic solution Place 1 drop into both eyes at bedtime.  Marland Kitchen LORazepam (ATIVAN) 1 MG tablet TAKE 1 TABLET BY MOUTH TID PRN ANXIETY  . Multiple Vitamin (MULTIVITAMIN) capsule Take 1 capsule by mouth daily.  . potassium chloride SA (K-DUR,KLOR-CON) 20 MEQ tablet Take 1 tablet (20 mEq total) by mouth daily.  Marland Kitchen PROAIR HFA 108 (90 Base) MCG/ACT inhaler INHALE 2 PUFFS INTO THE LUNGS Q 4 HOURS PRN  . timolol (TIMOPTIC) 0.5 % ophthalmic solution INSTILL 1 DROP IN BOTH EYES BID         Allergies  Allergen Reactions  . Percocet [Oxycodone-Acetaminophen] Itching  . Levaquin [Levofloxacin In D5w] Rash       Past Medical History:  Diagnosis Date  . Emphysema of lung (HCC)   .  Hypercholesterolemia   . Hypertension   . Osteoarthritis         Family History  Problem Relation Age of Onset  . Stroke Neg Hx         Past Surgical History:  Procedure Laterality Date  . CARDIOVERSION N/A 07/25/2016   Procedure: CARDIOVERSION;  Surgeon: Pricilla Riffle, MD;  Location: [facility] ENDOSCOPY;  Service: Cardiovascular;  Laterality: N/A;  . CATARACT EXTRACTION Bilateral   . LEG SURGERY Right   . TEE WITHOUT CARDIOVERSION N/A 07/25/2016   Procedure: TRANSESOPHAGEAL  ECHOCARDIOGRAM (TEE);  Surgeon: Pricilla Riffle, MD;  Location: [facility] ENDOSCOPY;  Service: Cardiovascular;  Laterality: N/A;   Social History        Social History  . Marital status: Widowed    Spouse name: N/A  . Number of children: N/A  . Years of education: N/A      Occupational History  . Not on file.        Social History Main Topics  . Smoking status: Former Smoker    Packs/day: 1.00    Years: 43.00    Types: Cigarettes    Start date: 08/26/1966    Quit date: 08/26/2010  . Smokeless tobacco: Never Used  . Alcohol use No  . Drug use: No  . Sexual activity: Not on file       Other Topics Concern  . Not on file      Social History Narrative  . No narrative on file     Review of Systems: General: negative for chills, fever, night sweats or weight changes.  Cardiovascular: negative for chest pain, dyspnea on exertion, edema, orthopnea, palpitations, paroxysmal nocturnal dyspnea or shortness of breath Dermatological: negative for rash Respiratory: negative for cough or wheezing Urologic: negative for hematuria Abdominal: negative for nausea, vomiting, diarrhea, bright red blood per rectum, melena, or hematemesis Neurologic: negative for visual changes, syncope, or dizziness All other systems reviewed and are otherwise negative except as noted above.   Physical Exam:  Blood pressure 102/78, pulse 92, height 5' (1.524 m), weight 217 lb (98.4 kg).  General appearance: alert, cooperative, no distress and moderately obese Neck: no carotid bruit and no JVD Lungs: right sided basilar rales  Heart: irregularly irregular rhythm and regularly irregular rhythm Extremities: 3+ bilateral LEE pitting edema Pulses: 2+ and symmetric Skin: Skin color, texture, turgor normal. No rashes or lesions Neurologic: Grossly normal  EKG Atrial fibrillation w/ CVR 84 bpm   ASSESSMENT AND PLAN:   1. Acute on Chronic Diastolic CHF: patient remains massively volume  overloaded and symptomatic with worsening dyspnea at rest and with exertion, despite increasing doses of her home lasix. She also notes decreased urinary output. Given her progressive symptoms and edema and poor response to PO diuretics, we will plan to admit for IV diuretics. I have discussed plan with Dr. Eden Emms, DOD, who agrees with admission. We will check baseline BMP and will start IV  Lasix 40 mg BID. Daily weights + low sodium diet and strict I/Os. Will repeat 2D echo to see if any changes in systolic function. If change in EF, will stop Cardizem and replace with BB. We will obtain a CXR. Lung exam is abnormal with RLL rales. This may be edema c/w CHF, however given her productive cough, we will need to rule-out PNA as well. F/u BMP in the am to monitor renal function and electrolytes.   2. Atrial Fibrillation: VR is controlled in the 80s on PO Cardizem. Pt failed DCCV in  06/2016. Plan to start an AAD (possibly Flecainide) but will start once her acute CHF resolves. Continue Eliquis for a/c. Possible repeat DCCV after initiation of AAD.     Robbie Lis PA-C 09/05/2016 4:45 PM

## 2016-09-06 ENCOUNTER — Encounter (HOSPITAL_COMMUNITY): Payer: Self-pay

## 2016-09-06 ENCOUNTER — Inpatient Hospital Stay (HOSPITAL_COMMUNITY): Payer: Medicare Other

## 2016-09-06 DIAGNOSIS — I509 Heart failure, unspecified: Secondary | ICD-10-CM

## 2016-09-06 LAB — BASIC METABOLIC PANEL
Anion gap: 8 (ref 5–15)
BUN: 20 mg/dL (ref 6–20)
CHLORIDE: 103 mmol/L (ref 101–111)
CO2: 30 mmol/L (ref 22–32)
CREATININE: 0.96 mg/dL (ref 0.44–1.00)
Calcium: 9.1 mg/dL (ref 8.9–10.3)
GFR calc Af Amer: >60 mL/min (ref >60)
GFR calc non Af Amer: >60 mL/min (ref >60)
GLUCOSE: 105 mg/dL — AB (ref 65–99)
POTASSIUM: 4.3 mmol/L (ref 3.5–5.1)
Sodium: 141 mmol/L (ref 135–145)

## 2016-09-06 LAB — ECHOCARDIOGRAM COMPLETE
Height: 60 in
Weight: 3440 oz

## 2016-09-06 MED ORDER — FUROSEMIDE 10 MG/ML IJ SOLN
80.0000 mg | Freq: Two times a day (BID) | INTRAMUSCULAR | Status: DC
Start: 2016-09-06 — End: 2016-09-08
  Administered 2016-09-06 – 2016-09-08 (×4): 80 mg via INTRAVENOUS
  Filled 2016-09-06 (×4): qty 8

## 2016-09-06 MED ORDER — FUROSEMIDE 10 MG/ML IJ SOLN
40.0000 mg | Freq: Once | INTRAMUSCULAR | Status: AC
  Administered 2016-09-06: 40 mg via INTRAVENOUS
  Filled 2016-09-06: qty 4

## 2016-09-06 NOTE — Progress Notes (Signed)
CM following for DCP; await for PT eval for disposition needs; Alexis Goodell [phone]

## 2016-09-06 NOTE — Progress Notes (Signed)
Patient Name: [name] Date of Encounter: 09/06/2016  Primary Cardiologist: Dr [facility] Problem List     Active Problems:   Acute on chronic diastolic CHF (congestive heart failure) (HCC)     Subjective   Mildly better with her SOB but not much.  No pain.   Inpatient Medications    Scheduled Meds: . apixaban  5 mg Oral BID  . atorvastatin  10 mg Oral q1800  . diltiazem  240 mg Oral Daily  . furosemide  40 mg Intravenous BID  . latanoprost  1 drop Both Eyes QHS  . potassium chloride SA  20 mEq Oral Daily  . sodium chloride flush  3 mL Intravenous Q12H  . timolol  1 drop Both Eyes Daily   Continuous Infusions:  PRN Meds: sodium chloride, acetaminophen, albuterol, HYDROcodone-acetaminophen, ondansetron (ZOFRAN) IV, sodium chloride flush   Vital Signs    Vitals:   09/05/16 2007 09/06/16 0007 09/06/16 0405 09/06/16 1146  BP: 120/74 120/77 110/85 133/83  Pulse: 92 87 84 82  Resp: 18 19 20 18   Temp: 97.7 F (36.5 C) 97.7 F (36.5 C) 97.3 F (36.3 C) 97.8 F (36.6 C)  TempSrc: Oral Oral Oral Oral  SpO2: 94% 96% 93% 90%  Weight:   215 lb (97.5 kg)   Height:        Intake/Output Summary (Last 24 hours) at 09/06/16 1543 Last data filed at 09/06/16 1400  Gross per 24 hour  Intake              560 ml  Output             1900 ml  Net            -1340 ml   Filed Weights   09/05/16 1740 09/06/16 0405  Weight: 215 lb 9.6 oz (97.8 kg) 215 lb (97.5 kg)    Physical Exam    GEN: NAD.  Neck:  Postive JVD Cardiac: Irregular Rate and Rhythm, no murmurs, rubs, or gallops.  Moderate edema.  Radials/DP/PT 2+  and equal bilaterally.  Respiratory:  Respirations slightly decreased regular and unlabored, clear to auscultation bilaterally. GI: Soft, nontender, nondistended, BS + x 4. Skin: warm and dry, no rash. Neuro:   Strength and sensation are intact. Psych:  AAOx3.  Normal affect.  Labs    CBC No results for input(s): WBC, NEUTROABS, HGB, HCT,  MCV, PLT in the last 72 hours. Basic Metabolic Panel  Recent Labs  09/05/16 1812 09/06/16 0524  NA 142 141  K 3.9 4.3  CL 105 103  CO2 30 30  GLUCOSE 105* 105*  BUN 21* 20  CREATININE 1.00 0.96  CALCIUM 9.2 9.1   Liver Function Tests No results for input(s): AST, ALT, ALKPHOS, BILITOT, PROT, ALBUMIN in the last 72 hours. No results for input(s): LIPASE, AMYLASE in the last 72 hours. Cardiac Enzymes No results for input(s): CKTOTAL, CKMB, CKMBINDEX, TROPONINI in the last 72 hours. BNP Invalid input(s): POCBNP D-Dimer No results for input(s): DDIMER in the last 72 hours. Hemoglobin A1C No results for input(s): HGBA1C in the last 72 hours. Fasting Lipid Panel No results for input(s): CHOL, HDL, LDLCALC, TRIG, CHOLHDL, LDLDIRECT in the last 72 hours. Thyroid Function Tests  Recent Labs  09/05/16 1812  TSH 2.070    Telemetry    Atrial fib with controlled ventricular rate - Personally Reviewed  ECG    NA - Personally Reviewed  Radiology    Dg Chest 2  View  Result Date: 09/06/2016 CLINICAL DATA:  Shortness of breath.  Hypertension. EXAM: CHEST  2 VIEW COMPARISON:  July 26, 2016 FINDINGS: There is interstitial edema. There is mild patchy alveolar opacity in the medial left base. The lungs elsewhere clear. Heart is mildly enlarged with pulmonary venous hypertension. No adenopathy. There is atherosclerotic calcification in the aorta. There is thoracolumbar levoscoliosis. There is degenerative change in the thoracic spine. IMPRESSION: Evidence a degree of congestive heart failure. Suspect atelectasis in the medial left base common early pneumonia in this area superimposed cannot be excluded. There is aortic atherosclerosis. Electronically Signed   By: Bretta Bang III M.D.   On: 09/06/2016 07:40    Cardiac Studies   ECHO  - Left ventricle: Abnormal septal motion There was moderate   concentric hypertrophy. Systolic function was normal. The   estimated ejection  fraction was in the range of 50% to 55%. Left   ventricular diastolic function parameters were normal. - Left atrium: The atrium was moderately dilated. - Atrial septum: No defect or patent foramen ovale was identified. - Pulmonary arteries: PA peak pressure: 41 mm Hg (S  Patient Profile     Larna A Williamsis a 67 y.o.femalewith a history of tobacco abuse. She quit smoking several years ago. Per records, she has no history of ischemic heart disease. She had a 2D echo 01/2016 that showed normal LVEF of 60-65% with grade 1DD.  Also with persistent atrial fib.  She was admitted with acute on chronic diastolic dysfunction.   Assessment & Plan    ACUTE ON CHRONIC DIASTOLIC DYSFUNCTION:  Note.  EF is low normal and slightly lower than previous.  Can follow with repeat echoes in the future.   Down 1340 cc since admission.  Weight is unchanged.   Needs continued diuresis.  I will increase Lasix today.    ATRIAL FIB:  On Eliquis. Plan is for Flecainide which can be started at the time of discharge or as an out patient.   Signed, Rollene Rotunda, MD  09/06/2016, 3:43 PM

## 2016-09-07 DIAGNOSIS — I5030 Unspecified diastolic (congestive) heart failure: Secondary | ICD-10-CM

## 2016-09-07 LAB — BASIC METABOLIC PANEL
Anion gap: 13 (ref 5–15)
BUN: 26 mg/dL — AB (ref 6–20)
CALCIUM: 9.6 mg/dL (ref 8.9–10.3)
CO2: 33 mmol/L — ABNORMAL HIGH (ref 22–32)
CREATININE: 1.16 mg/dL — AB (ref 0.44–1.00)
Chloride: 96 mmol/L — ABNORMAL LOW (ref 101–111)
GFR calc non Af Amer: 48 mL/min — ABNORMAL LOW (ref >60)
GFR, EST AFRICAN AMERICAN: 56 mL/min — AB (ref >60)
GLUCOSE: 173 mg/dL — AB (ref 65–99)
Potassium: 4.1 mmol/L (ref 3.5–5.1)
Sodium: 142 mmol/L (ref 135–145)

## 2016-09-07 NOTE — Discharge Instructions (Signed)

## 2016-09-07 NOTE — Progress Notes (Signed)
Subjective:   Increased SOB this AM   Objective:   Temp:  [97.6 F (36.4 C)-98 F (36.7 C)] 98 F (36.7 C) (01/13 0502) Pulse Rate:  [74-100] 74 (01/13 0502) Resp:  [18] 18 (01/13 0502) BP: (112-117)/(60-69) 112/60 (01/13 0502) SpO2:  [93 %-94 %] 94 % (01/13 0502) Weight:  [212 lb 1.6 oz (96.2 kg)] 212 lb 1.6 oz (96.2 kg) (01/13 0502) Last BM Date: 09/06/16  Filed Weights   09/05/16 1740 09/06/16 0405 09/07/16 0502  Weight: 215 lb 9.6 oz (97.8 kg) 215 lb (97.5 kg) 212 lb 1.6 oz (96.2 kg)    Intake/Output Summary (Last 24 hours) at 09/07/16 1206 Last data filed at 09/07/16 1018  Gross per 24 hour  Intake             1966 ml  Output             2820 ml  Net             -854 ml    Telemetry: rate controlled afib  Exam:  General: NAD  HEENT:sclera clear, throat clear  Resp:bilateral crackles, mild expiratory wheezing.   Cardiac: irreg, no m/r/g, no jvd  GI: abdomen soft, NT, ND  MSK: 2+ bilateral LE edema  Neuro: no focal deficits  Psych: appropriate affect  Lab Results:  Basic Metabolic Panel:  Recent Labs Lab 09/05/16 1812 09/06/16 0524 09/07/16 0529  NA 142 141 142  K 3.9 4.3 4.1  CL 105 103 96*  CO2 30 30 33*  GLUCOSE 105* 105* 173*  BUN 21* 20 26*  CREATININE 1.00 0.96 1.16*  CALCIUM 9.2 9.1 9.6    Liver Function Tests: No results for input(s): AST, ALT, ALKPHOS, BILITOT, PROT, ALBUMIN in the last 168 hours.  CBC: No results for input(s): WBC, HGB, HCT, MCV, PLT in the last 168 hours.  Cardiac Enzymes: No results for input(s): CKTOTAL, CKMB, CKMBINDEX, TROPONINI in the last 168 hours.  BNP:  Recent Labs  06/25/16 1308  PROBNP 141.0*    Coagulation: No results for input(s): INR in the last 168 hours.  ECG:   Medications:   Scheduled Medications: . apixaban  5 mg Oral BID  . atorvastatin  10 mg Oral q1800  . diltiazem  240 mg Oral Daily  . furosemide  80 mg Intravenous BID  . latanoprost  1 drop Both Eyes QHS  .  potassium chloride SA  20 mEq Oral Daily  . sodium chloride flush  3 mL Intravenous Q12H  . timolol  1 drop Both Eyes Daily     Infusions:   PRN Medications:  sodium chloride, acetaminophen, albuterol, HYDROcodone-acetaminophen, ondansetron (ZOFRAN) IV, sodium chloride flush     Assessment/Plan   1. Acute on chronic CHF with presevered EF - echo Jan 2018 LVEF 50-55%, mod LAE. Normal diastolic parameters reported.  - negative 1.4 liters yesterday, negative 2.4 liters since admssion. Shee is on lasix 80mg  IV bid(increased just last night). Uptrend in Cr and BUN. - still with severe LE edema, significant crackles on exam. Continue IV diuresis on higher dose lasix.   2. Afib - previous DCCV 06/2016, back in afib. Outpatient notes mention possible antiarrhythmic once CHF compensated.    - rate control with dilt. Stroke prevention with eliquis. CHADS2Vasc score is 4 - Tele shows rate controlled afib       Dina Rich, M.D., F.A.C.C.Patient ID: [name], female   DOB: 1950-11-16, 66 y.o.   MRN: [id number]

## 2016-09-08 LAB — BASIC METABOLIC PANEL
Anion gap: 10 (ref 5–15)
BUN: 26 mg/dL — AB (ref 6–20)
CHLORIDE: 95 mmol/L — AB (ref 101–111)
CO2: 35 mmol/L — ABNORMAL HIGH (ref 22–32)
CREATININE: 1 mg/dL (ref 0.44–1.00)
Calcium: 9 mg/dL (ref 8.9–10.3)
GFR calc Af Amer: >60 mL/min (ref >60)
GFR, EST NON AFRICAN AMERICAN: 57 mL/min — AB (ref >60)
GLUCOSE: 153 mg/dL — AB (ref 65–99)
POTASSIUM: 3.7 mmol/L (ref 3.5–5.1)
SODIUM: 140 mmol/L (ref 135–145)

## 2016-09-08 LAB — MAGNESIUM: MAGNESIUM: 2.1 mg/dL (ref 1.7–2.4)

## 2016-09-08 MED ORDER — FUROSEMIDE 10 MG/ML IJ SOLN
80.0000 mg | Freq: Three times a day (TID) | INTRAMUSCULAR | Status: DC
  Administered 2016-09-08 – 2016-09-11 (×9): 80 mg via INTRAVENOUS
  Filled 2016-09-08 (×9): qty 8

## 2016-09-08 NOTE — Progress Notes (Signed)
Primary cardiologist:  Subjective:    SOB is improving.   Objective:   Temp:  [97.7 F (36.5 C)-98.1 F (36.7 C)] 98.1 F (36.7 C) (01/14 0626) Pulse Rate:  [85-91] 91 (01/14 0626) Resp:  [20] 20 (01/14 0626) BP: (98-109)/(54-56) 98/56 (01/14 0626) SpO2:  [94 %] 94 % (01/14 0626) Weight:  [210 lb 3.2 oz (95.3 kg)] 210 lb 3.2 oz (95.3 kg) (01/14 0626) Last BM Date: 09/06/16  Filed Weights   09/06/16 0405 09/07/16 0502 09/08/16 0626  Weight: 215 lb (97.5 kg) 212 lb 1.6 oz (96.2 kg) 210 lb 3.2 oz (95.3 kg)    Intake/Output Summary (Last 24 hours) at 09/08/16 1213 Last data filed at 09/08/16 1000  Gross per 24 hour  Intake             1040 ml  Output             2450 ml  Net            -1410 ml    Telemetry: rate controlled afib. Some early AM low rates in 30s  Exam:  General: NAD  HEENT: sclera clear, throat clear  Resp: mild crackles bilateral bases  Cardiac: irreg, no m/r/g, no jvd  GI: abdomen soft, NT, ND  MSK: 2+ bilateral LE edema  Neuro: no focal deficits  Psych: appropriate affect  Lab Results:  Basic Metabolic Panel:  Recent Labs Lab 09/06/16 0524 09/07/16 0529 09/08/16 0424  NA 141 142 140  K 4.3 4.1 3.7  CL 103 96* 95*  CO2 30 33* 35*  GLUCOSE 105* 173* 153*  BUN 20 26* 26*  CREATININE 0.96 1.16* 1.00  CALCIUM 9.1 9.6 9.0  MG  --   --  2.1    Liver Function Tests: No results for input(s): AST, ALT, ALKPHOS, BILITOT, PROT, ALBUMIN in the last 168 hours.  CBC: No results for input(s): WBC, HGB, HCT, MCV, PLT in the last 168 hours.  Cardiac Enzymes: No results for input(s): CKTOTAL, CKMB, CKMBINDEX, TROPONINI in the last 168 hours.  BNP:  Recent Labs  06/25/16 1308  PROBNP 141.0*    Coagulation: No results for input(s): INR in the last 168 hours.  ECG:   Medications:   Scheduled Medications: . apixaban  5 mg Oral BID  . atorvastatin  10 mg Oral q1800  . diltiazem  240 mg Oral Daily  . furosemide  80 mg  Intravenous BID  . latanoprost  1 drop Both Eyes QHS  . potassium chloride SA  20 mEq Oral Daily  . sodium chloride flush  3 mL Intravenous Q12H  . timolol  1 drop Both Eyes Daily     Infusions:   PRN Medications:  sodium chloride, acetaminophen, albuterol, HYDROcodone-acetaminophen, ondansetron (ZOFRAN) IV, sodium chloride flush     Assessment/Plan    1. Acute on chronic CHF with presevered EF - echo Jan 2018 LVEF 50-55%, mod LAE. Normal diastolic parameters reported.  - negative yesterday, negative 3.8 liters since admssion. Shee is on lasix 80mg  IV bid. Mild variation in her renal function but overall stable.  - still with severe LE edema, significant crackles on exam. Mild diuresis yesterday despite increasing lasix to 80mg  IV bid. We will change her to 80mg  tid today.     2. Afib - previous DCCV 06/2016, back in afib. Outpatient notes mention possible antiarrhythmic once CHF compensated.    - rate control with dilt. Stroke prevention with eliquis. CHADS2Vasc score is 4 -  Tele shows rate controlled afib       [name], M.D.

## 2016-09-09 DIAGNOSIS — I480 Paroxysmal atrial fibrillation: Secondary | ICD-10-CM

## 2016-09-09 DIAGNOSIS — J449 Chronic obstructive pulmonary disease, unspecified: Secondary | ICD-10-CM

## 2016-09-09 LAB — BASIC METABOLIC PANEL
Anion gap: 10 (ref 5–15)
BUN: 25 mg/dL — AB (ref 6–20)
CHLORIDE: 93 mmol/L — AB (ref 101–111)
CO2: 37 mmol/L — ABNORMAL HIGH (ref 22–32)
Calcium: 9.7 mg/dL (ref 8.9–10.3)
Creatinine, Ser: 1.05 mg/dL — ABNORMAL HIGH (ref 0.44–1.00)
GFR, EST NON AFRICAN AMERICAN: 54 mL/min — AB (ref >60)
Glucose, Bld: 110 mg/dL — ABNORMAL HIGH (ref 65–99)
Potassium: 3.8 mmol/L (ref 3.5–5.1)
SODIUM: 140 mmol/L (ref 135–145)

## 2016-09-09 MED ORDER — POTASSIUM CHLORIDE CRYS ER 20 MEQ PO TBCR
40.0000 meq | EXTENDED_RELEASE_TABLET | Freq: Two times a day (BID) | ORAL | Status: DC
  Administered 2016-09-09 – 2016-09-13 (×8): 40 meq via ORAL
  Filled 2016-09-09 (×8): qty 2

## 2016-09-09 NOTE — Progress Notes (Signed)
Patient is alert and oriented very pleasant. No distress  restful night, up to the bedside commode independently with no complaints of pain, Sob on excretions with O2 N/C for comfort. Lost 2 lbs over night.

## 2016-09-09 NOTE — Care Management Important Message (Signed)
Important Message  Patient Details  Name: [name] MRN: [id number] Date of Birth: 06/26/1951   Medicare Important Message Given:  Yes    Sephora Boyar Stefan Church 09/09/2016, 4:17 PM

## 2016-09-09 NOTE — Consult Note (Signed)
ELECTROPHYSIOLOGY CONSULT NOTE    Patient ID: [name] MRN: [id number], DOB/AGE: [birth year] 66 y.o.  Admit date: 09/05/2016 Date of Consult: 09/09/2016   Primary Physician: [name], MD Primary Cardiologist: Dr. [name] Requesting MD: Dr. Rennis Golden  Reason for Consultation: AFib rhythm control evaluation  HPI: [name] is a 66 y.o. female with PMHx of emphysema, known long term smoker (quit 5 years ago), HTN, she had a CHF/CAP admission in November sent via her pulmonologist then noting AFib at that time.  She was admitted now on 09/05/16 with CHF (diastolic) arriving with HR 80's.  She last saw Dr. [name] last month, there was discussion of trying to start AAD once her fluid status improved (noting she was in HF then, her lasix was up-titrated and planned for close f/u) also noting she did not seem to be bothered with palpitations, but thoughts were to try and keep her out of AF with AAD then DCCV.  EP is being asked to weigh in on AAD or rhythm control strategy/options for the patient.  LABS:  K+ 3.8 BUN/Creat 25/1.05  09/05/16 TSH 2.070  07/27/16 H/H 15/49 WBC 9.7 Plts 166   AFib Hx: 1st Dx Nov 2017 DCCV 07/25/2016 with ERAF the same evening >> rate control strategy No hx of AAD  Past Medical History:  Diagnosis Date  . Emphysema of lung (HCC)   . Hypercholesterolemia   . Hypertension   . Osteoarthritis      Surgical History:  Past Surgical History:  Procedure Laterality Date  . CARDIOVERSION N/A 07/25/2016   Procedure: CARDIOVERSION;  Surgeon: Pricilla Riffle, MD;  Location: [facility] ENDOSCOPY;  Service: Cardiovascular;  Laterality: N/A;  . CATARACT EXTRACTION Bilateral   . LEG SURGERY Right   . TEE WITHOUT CARDIOVERSION N/A 07/25/2016   Procedure: TRANSESOPHAGEAL ECHOCARDIOGRAM (TEE);  Surgeon: Pricilla Riffle, MD;  Location: [facility] ENDOSCOPY;  Service: Cardiovascular;  Laterality: N/A;     Prescriptions Prior to Admission  Medication Sig Dispense  Refill Last Dose  . acetaminophen (TYLENOL) 500 MG tablet Take 1,000 mg by mouth every 6 (six) hours as needed.   Past Week at Unknown time  . apixaban (ELIQUIS) 5 MG TABS tablet Take 1 tablet (5 mg total) by mouth 2 (two) times daily. 60 tablet 0 09/05/2016 at 0600  . atorvastatin (LIPITOR) 10 MG tablet Take 1 tablet (10 mg total) by mouth daily at 6 PM. 30 tablet 0 09/04/2016 at Unknown time  . diltiazem (CARDIZEM CD) 240 MG 24 hr capsule Take 1 capsule (240 mg total) by mouth daily. 90 capsule 3 09/05/2016 at Unknown time  . furosemide (LASIX) 40 MG tablet Take 1 tablet (40 mg total) by mouth daily. 90 tablet 3 09/04/2016 at Unknown time  . HYDROcodone-acetaminophen (NORCO/VICODIN) 5-325 MG tablet Take 1 tablet by mouth every 6 (six) hours as needed for moderate pain.   09/05/2016 at Unknown time  . latanoprost (XALATAN) 0.005 % ophthalmic solution Place 1 drop into both eyes at bedtime.   09/04/2016 at Unknown time  . LORazepam (ATIVAN) 1 MG tablet TAKE 1 TABLET BY MOUTH TID PRN ANXIETY  1 Past Week at Unknown time  . Multiple Vitamin (MULTIVITAMIN) capsule Take 1 capsule by mouth daily.   09/04/2016 at Unknown time  . Oxymetazoline HCl (SINEX ULTRA FINE MIST 12-HOUR NA) Place 1 spray into the nose daily as needed (congestion).   09/04/2016 at Unknown time  . potassium chloride SA (K-DUR,KLOR-CON) 20 MEQ tablet Take 1 tablet (20  mEq total) by mouth daily. 90 tablet 3 09/04/2016 at Unknown time  . PROAIR HFA 108 (90 Base) MCG/ACT inhaler INHALE 2 PUFFS INTO THE LUNGS Q 4 HOURS PRN  0 09/05/2016 at Unknown time  . timolol (TIMOPTIC) 0.5 % ophthalmic solution INSTILL 1 DROP IN BOTH EYES BID  3 09/04/2016 at Unknown time    Inpatient Medications:  . apixaban  5 mg Oral BID  . atorvastatin  10 mg Oral q1800  . diltiazem  240 mg Oral Daily  . furosemide  80 mg Intravenous TID  . latanoprost  1 drop Both Eyes QHS  . potassium chloride SA  20 mEq Oral Daily  . sodium chloride flush  3 mL Intravenous Q12H  .  timolol  1 drop Both Eyes Daily    Allergies:  Allergies  Allergen Reactions  . Levaquin [Levofloxacin In D5w] Rash  . Percocet [Oxycodone-Acetaminophen] Itching    Social History   Social History  . Marital status: Widowed    Spouse name: N/A  . Number of children: N/A  . Years of education: N/A   Occupational History  . Not on file.   Social History Main Topics  . Smoking status: Former Smoker    Packs/day: 1.00    Years: 43.00    Types: Cigarettes    Start date: 08/26/1966    Quit date: 08/26/2010  . Smokeless tobacco: Never Used  . Alcohol use No  . Drug use: No  . Sexual activity: Not on file   Other Topics Concern  . Not on file   Social History Narrative  . No narrative on file     Family History  Problem Relation Age of Onset  . Stroke Neg Hx      Review of Systems: All other systems reviewed and are otherwise negative except as noted above.  Physical Exam: Vitals:   09/08/16 1256 09/08/16 2107 09/09/16 0553 09/09/16 0700  BP: 100/71 105/77  110/79  Pulse: 90 79  90  Resp: 20 20  20   Temp: 97.5 F (36.4 C) 97.7 F (36.5 C)  97.8 F (36.6 C)  TempSrc: Oral Oral  Oral  SpO2: 92% 93%  95%  Weight:   208 lb 3.2 oz (94.4 kg)   Height:        GEN- The patient is well appearing, alert and oriented x 3 today.   HEENT: normocephalic, atraumatic; sclera clear, conjunctiva pink; hearing intact; oropharynx clear; neck supple, no JVP Lymph- no cervical lymphadenopathy Lungs- diminished at he bases, soft crackles R>L, normal work of breathing.  No wheezes, rales, rhonchi Heart- IRRR, no murmurs, rubs or gallops, PMI not laterally displaced GI- soft, non-tender, non-distended, bowel sounds present Extremities- no clubbing, cyanosis, 3+ edema MS- no significant deformity or atrophy Skin- warm and dry, no rash or lesion Psych- euthymic mood, full affect Neuro- no gross deficits observed  Labs:   Lab Results  Component Value Date   WBC 9.7 07/27/2016     HGB 15.6 (H) 07/27/2016   HCT 49.1 (H) 07/27/2016   MCV 95.9 07/27/2016   PLT 166 07/27/2016    Recent Labs Lab 09/09/16 0549  NA 140  K 3.8  CL 93*  CO2 37*  BUN 25*  CREATININE 1.05*  CALCIUM 9.7  GLUCOSE 110*      Radiology/Studies:  Dg Chest 2 View Result Date: 09/06/2016 CLINICAL DATA:  Shortness of breath.  Hypertension. EXAM: CHEST  2 VIEW COMPARISON:  July 26, 2016 FINDINGS: There is interstitial  edema. There is mild patchy alveolar opacity in the medial left base. The lungs elsewhere clear. Heart is mildly enlarged with pulmonary venous hypertension. No adenopathy. There is atherosclerotic calcification in the aorta. There is thoracolumbar levoscoliosis. There is degenerative change in the thoracic spine. IMPRESSION: Evidence a degree of congestive heart failure. Suspect atelectasis in the medial left base common early pneumonia in this area superimposed cannot be excluded. There is aortic atherosclerosis. Electronically Signed   By: Bretta Bang III M.D.   On: 09/06/2016 07:40    EKG:  AFib 84bpm, QRS 72ms, QTc399 07/25/16: SR, 60bpm,  PR , QRS 82ms, QTc  TELEMETRY: AFib, generally 70's  09/06/16: TTE Study Conclusions - Left ventricle: Abnormal septal motion There was moderate   concentric hypertrophy. Systolic function was normal. The   estimated ejection fraction was in the range of 50% to 55%. Left   ventricular diastolic function parameters were normal. - Left atrium: The atrium was moderately dilated. - Atrial septum: No defect or patent foramen ovale was identified. - Pulmonary arteries: PA peak pressure: 41 mm Hg (S). IVS 13.57mm, PW 12mm LA 47mm, 50ml   02/06/16: TTE Study Conclusions - Left ventricle: The cavity size was normal. Wall thickness was   normal. Systolic function was normal. The estimated ejection   fraction was in the range of 60% to 65%. Wall motion was normal;   there were no regional wall motion abnormalities.  Doppler   parameters are consistent with abnormal left ventricular   relaxation (grade 1 diastolic dysfunction). - Right ventricle: The cavity size was mildly dilated. Wall   thickness was normal. - Right atrium: The atrium was mildly dilated. IVS 11.9mm, PW 10.24mm LA 34mm, 38ml    Assessment and Plan:   1. Persistent Afib     CHA2DS2Vasc is 3, on Eliquis (denies any missed doses since started months ago)     Hx of ERAF same day post DCCV in November, no hx of AAD     Rate is well controlled       LVH <1.5cm by both echos, though with acute CHF would hold off on initiating AAD., Sotalol may be a good option  LA size may make maintaining SR difficult Unknown when she went into AFib, given she doesn't have much in the way of awareness of her AF though first officially noted only 2 months ago.  She reports that back in June she noted feeling SOB and easily winded EKGs in June are sinus Uncertain how much AF contributes to her CHF  She has known severe OSA by home testing, pending in-lab f/u sleep study and treatment Discussed weight loss, exercise, life style strategies as well She denies ETOH intake at all  Dr. [name] to see   2. CHF exacerbation     Diastolic     Cumulative fluid neg -     Remains with fluid OL, diuretics increased     C/w primary cardiology team    Signed, Francis Dowse, PA-C 09/09/2016 12:45 PM   Patient seen and examined. There has been interval 25 pound weight gain over the last year with 10-15 pounds over the last 6 months. She has been aware of atrial fibrillation over the last 6 months and has struggled with heart failure manifested by peripheral edema and dyspnea. She has a severe obstructive sleep apnea and so the interplay between these 3 entities is clearly contributing but the major player is not clear. As, I think we should treat those  things that we can given the recurrent hospitalizations.  She is clearly severely volume overloaded. She  probably needs another 5 L of diuresis. I discussed this with Dr. Rennis Golden and he will augment this.. She is also Alkalemia; this may contribute to her dyspnea. We will continue with aggressive chloride repletion. Her BUN and creatinine are going up. I'm not sure how much more diuresis she will be able to tolerate   This then encroaches upon the issue of her atrial fibrillation. I would target trying to restore sinus rhythm sooner rather than later and elevating BUN/creatinine ratio may drive the necessity for this. Antiarrhythmic options for her would include a 1C, a class III or amiodarone. Given her heart failure, I would lean towards the latter 2 being given her age I would probably lean towards dofetilide if we can keep her potassium and range and her kidney function does not become an issue. Otherwise I would use amiodarone. We will be available over the next couple of days as necessary.

## 2016-09-09 NOTE — Progress Notes (Signed)
Patient Name: [name] Date of Encounter: 09/09/2016  Primary Cardiologist: Dr [facility] Problem List     Active Problems:   Acute on chronic diastolic CHF (congestive heart failure) (HCC)     Subjective  No events overnight. Breathing is better, but still dyspneic at rest. 2+ LE edema - diuresis improved overnight on TID lasix. Plans for flecainide, however, ?if this is wise given moderate LVH and acute diastolic CHF - should be a contraindication to a 1C antiarrythmic.   Inpatient Medications    Scheduled Meds: . apixaban  5 mg Oral BID  . atorvastatin  10 mg Oral q1800  . diltiazem  240 mg Oral Daily  . furosemide  80 mg Intravenous TID  . latanoprost  1 drop Both Eyes QHS  . potassium chloride SA  20 mEq Oral Daily  . sodium chloride flush  3 mL Intravenous Q12H  . timolol  1 drop Both Eyes Daily   Continuous Infusions:  PRN Meds: sodium chloride, acetaminophen, albuterol, HYDROcodone-acetaminophen, ondansetron (ZOFRAN) IV, sodium chloride flush   Vital Signs    Vitals:   09/08/16 1256 09/08/16 2107 09/09/16 0553 09/09/16 0700  BP: 100/71 105/77  110/79  Pulse: 90 79  90  Resp: 20 20  20   Temp: 97.5 F (36.4 C) 97.7 F (36.5 C)  97.8 F (36.6 C)  TempSrc: Oral Oral  Oral  SpO2: 92% 93%  95%  Weight:   208 lb 3.2 oz (94.4 kg)   Height:        Intake/Output Summary (Last 24 hours) at 09/09/16 1046 Last data filed at 09/09/16 0920  Gross per 24 hour  Intake             1162 ml  Output             2200 ml  Net            -1038 ml   Filed Weights   09/07/16 0502 09/08/16 0626 09/09/16 0553  Weight: 212 lb 1.6 oz (96.2 kg) 210 lb 3.2 oz (95.3 kg) 208 lb 3.2 oz (94.4 kg)    Physical Exam    GEN: Well nourished, moderately obese, well developed, in no acute distress.  HEENT: Grossly normal.  Neck: Supple, no JVD, carotid bruits, or masses. Cardiac: RRR, no murmurs, rubs, or gallops. No clubbing, cyanosis, edema.  Radials/DP/PT 2+ and  equal bilaterally, 2+ LE Edema Respiratory:  Respirations mildly labored, clear to auscultation bilaterally. GI: Soft, nontender, nondistended, BS + x 4. MS: no deformity or atrophy. Skin: warm and dry, no rash. Neuro:  Strength and sensation are intact. Psych: AAOx3.  Normal affect.  Labs    CBC No results for input(s): WBC, NEUTROABS, HGB, HCT, MCV, PLT in the last 72 hours. Basic Metabolic Panel  Recent Labs  09/08/16 0424 09/09/16 0549  NA 140 140  K 3.7 3.8  CL 95* 93*  CO2 35* 37*  GLUCOSE 153* 110*  BUN 26* 25*  CREATININE 1.00 1.05*  CALCIUM 9.0 9.7  MG 2.1  --    Liver Function Tests No results for input(s): AST, ALT, ALKPHOS, BILITOT, PROT, ALBUMIN in the last 72 hours. No results for input(s): LIPASE, AMYLASE in the last 72 hours. Cardiac Enzymes No results for input(s): CKTOTAL, CKMB, CKMBINDEX, TROPONINI in the last 72 hours. BNP Invalid input(s): POCBNP D-Dimer No results for input(s): DDIMER in the last 72 hours. Hemoglobin A1C No results for input(s): HGBA1C in the last 72 hours.  Fasting Lipid Panel No results for input(s): CHOL, HDL, LDLCALC, TRIG, CHOLHDL, LDLDIRECT in the last 72 hours. Thyroid Function Tests No results for input(s): TSH, T4TOTAL, T3FREE, THYROIDAB in the last 72 hours.  Invalid input(s): FREET3  Telemetry    Atrial fib with rates in 60's-80's  ECG    No new tracings  Radiology    No results found.  Cardiac Studies   09/06/2016 Echo Study Conclusions  - Left ventricle: Abnormal septal motion There was moderate   concentric hypertrophy. Systolic function was normal. The   estimated ejection fraction was in the range of 50% to 55%. Left   ventricular diastolic function parameters were normal. - Left atrium: The atrium was moderately dilated. - Atrial septum: No defect or patent foramen ovale was identified. - Pulmonary arteries: PA peak pressure: 41 mm Hg (S).  Patient Profile     [name] a 66  y.o.femalewith a history of tobacco abuse. She quit smoking several years ago. Per records, she has no history of ischemic heart disease. She had a 2D echo 01/2016 that showed normal LVEF of 60-65% with grade 1DD.  Also with persistent atrial fib.  She was admitted with acute on chronic diastolic dysfunction.    Assessment & Plan    1. Acute on chronic diastolic CHF - echo Jan 2018 LVEF 50-55%, mod LAE. Normal diastolic parameters reported.  -Pt admitted with volume overload. DOE, orthopnea, increased edema. CXR on 1/12 showed some degree if CHF and possible early PNA -She was place on lasix 80 IV BID with moderate diuresis. She remained volume overloaded with severe LE edema and crackles in the lung, so Lasix was increased to TID yesterday. -I&O : negative 1,618 over 24h, negative 5 liters since admission -weight down from 215 on admission (1/11) to 208 today (2 lb loss since yesterday). Weight per office note in 06/2016 was 202. -Creatinine stable -Continue Lasix 80 mg IV TID diuresis  2. Atrial fibrillation  -Previous DCCV 06/2016 with reversion right back to afib. Currently maintained on diltiazem for rate control. Outpatient notes mention possible antiarrhythmic once CHF compensated -This patients CHA2DS2-VASc Score 4 (CHF, age (1), female, HTN). She is anticoagulated with Eliquis. -Will consider EP consult for antiarrythmic medication suggestions - I believe flecainide is contraindicated due to acute diastolic CHF and moderate LVH. May need to consider Tikosyn or sotalol - probably avoid amiodarone d/t concomitant lung disease.  Chrystie Nose, MD, [facility] Attending Cardiologist [facility]

## 2016-09-10 DIAGNOSIS — I481 Persistent atrial fibrillation: Principal | ICD-10-CM

## 2016-09-10 DIAGNOSIS — E785 Hyperlipidemia, unspecified: Secondary | ICD-10-CM

## 2016-09-10 DIAGNOSIS — E876 Hypokalemia: Secondary | ICD-10-CM

## 2016-09-10 LAB — BASIC METABOLIC PANEL
Anion gap: 10 (ref 5–15)
BUN: 27 mg/dL — AB (ref 6–20)
CALCIUM: 9.3 mg/dL (ref 8.9–10.3)
CO2: 36 mmol/L — ABNORMAL HIGH (ref 22–32)
CREATININE: 0.97 mg/dL (ref 0.44–1.00)
Chloride: 93 mmol/L — ABNORMAL LOW (ref 101–111)
GFR calc non Af Amer: 60 mL/min — ABNORMAL LOW (ref >60)
Glucose, Bld: 103 mg/dL — ABNORMAL HIGH (ref 65–99)
Potassium: 4 mmol/L (ref 3.5–5.1)
SODIUM: 139 mmol/L (ref 135–145)

## 2016-09-10 MED ORDER — METOLAZONE 2.5 MG PO TABS
2.5000 mg | ORAL_TABLET | Freq: Every day | ORAL | Status: DC
  Administered 2016-09-10 – 2016-09-11 (×2): 2.5 mg via ORAL
  Filled 2016-09-10 (×2): qty 1

## 2016-09-10 NOTE — Progress Notes (Signed)
Patient Name: [name] Date of Encounter: 09/10/2016  Primary Cardiologist: Dr [facility] Problem List     Active Problems:   Acute on chronic diastolic CHF (congestive heart failure) (HCC)     Subjective  No events overnight. Diuresis is slowing at -6L negative. Creatinine is stable. CO2 is fairly stable.   Inpatient Medications    Scheduled Meds: . apixaban  5 mg Oral BID  . atorvastatin  10 mg Oral q1800  . diltiazem  240 mg Oral Daily  . furosemide  80 mg Intravenous TID  . latanoprost  1 drop Both Eyes QHS  . potassium chloride SA  40 mEq Oral BID  . sodium chloride flush  3 mL Intravenous Q12H  . timolol  1 drop Both Eyes Daily   Continuous Infusions:  PRN Meds: sodium chloride, acetaminophen, albuterol, HYDROcodone-acetaminophen, ondansetron (ZOFRAN) IV, sodium chloride flush   Vital Signs    Vitals:   09/09/16 1245 09/09/16 2300 09/10/16 0548 09/10/16 1210  BP: 105/72 105/75 (!) 115/57 104/66  Pulse: 78 84 100 65  Resp: 18 18 18 18   Temp: 97.6 F (36.4 C) 98.5 F (36.9 C) 97.6 F (36.4 C) 97.8 F (36.6 C)  TempSrc: Oral Oral Oral Oral  SpO2: 96% 97% 96% 97%  Weight:   207 lb 6.4 oz (94.1 kg)   Height:        Intake/Output Summary (Last 24 hours) at 09/10/16 1259 Last data filed at 09/10/16 1211  Gross per 24 hour  Intake              683 ml  Output             1400 ml  Net             -717 ml   Filed Weights   09/08/16 0626 09/09/16 0553 09/10/16 0548  Weight: 210 lb 3.2 oz (95.3 kg) 208 lb 3.2 oz (94.4 kg) 207 lb 6.4 oz (94.1 kg)    Physical Exam    GEN: Well nourished, moderately obese, well developed, in no acute distress.  HEENT: Grossly normal.  Neck: Supple, no JVD, carotid bruits, or masses. Cardiac: RRR, no murmurs, rubs, or gallops. No clubbing, cyanosis, edema.  Radials/DP/PT 2+ and equal bilaterally, 2+ LE Edema Respiratory:  Respirations mildly labored, clear to auscultation bilaterally. GI: Soft, nontender,  nondistended, BS + x 4. MS: no deformity or atrophy. Skin: warm and dry, no rash. Neuro:  Strength and sensation are intact. Psych: AAOx3.  Normal affect.  Labs    CBC No results for input(s): WBC, NEUTROABS, HGB, HCT, MCV, PLT in the last 72 hours. Basic Metabolic Panel  Recent Labs  09/08/16 0424 09/09/16 0549 09/10/16 0318  NA 140 140 139  K 3.7 3.8 4.0  CL 95* 93* 93*  CO2 35* 37* 36*  GLUCOSE 153* 110* 103*  BUN 26* 25* 27*  CREATININE 1.00 1.05* 0.97  CALCIUM 9.0 9.7 9.3  MG 2.1  --   --    Liver Function Tests No results for input(s): AST, ALT, ALKPHOS, BILITOT, PROT, ALBUMIN in the last 72 hours. No results for input(s): LIPASE, AMYLASE in the last 72 hours. Cardiac Enzymes No results for input(s): CKTOTAL, CKMB, CKMBINDEX, TROPONINI in the last 72 hours. BNP Invalid input(s): POCBNP D-Dimer No results for input(s): DDIMER in the last 72 hours. Hemoglobin A1C No results for input(s): HGBA1C in the last 72 hours. Fasting Lipid Panel No results for input(s): CHOL, HDL, LDLCALC,  TRIG, CHOLHDL, LDLDIRECT in the last 72 hours. Thyroid Function Tests No results for input(s): TSH, T4TOTAL, T3FREE, THYROIDAB in the last 72 hours.  Invalid input(s): FREET3  Telemetry    Atrial fib with rates in 60's-80's  ECG    No new tracings  Radiology    No results found.  Cardiac Studies   09/06/2016 Echo Study Conclusions  - Left ventricle: Abnormal septal motion There was moderate   concentric hypertrophy. Systolic function was normal. The   estimated ejection fraction was in the range of 50% to 55%. Left   ventricular diastolic function parameters were normal. - Left atrium: The atrium was moderately dilated. - Atrial septum: No defect or patent foramen ovale was identified. - Pulmonary arteries: PA peak pressure: 41 mm Hg (S).  Patient Profile     [name] a 66 y.o.femalewith a history of tobacco abuse. She quit smoking several years ago.  Per records, she has no history of ischemic heart disease. She had a 2D echo 01/2016 that showed normal LVEF of 60-65% with grade 1DD.  Also with persistent atrial fib.  She was admitted with acute on chronic diastolic dysfunction.    Assessment & Plan    1. Acute on chronic diastolic CHF - echo Jan 2018 LVEF 50-55%, mod LAE. Normal diastolic parameters reported.  -Pt admitted with volume overload. DOE, orthopnea, increased edema. CXR on 1/12 showed some degree if CHF and possible early PNA -She was place on lasix 80 IV BID with moderate diuresis. She remained volume overloaded with severe LE edema and crackles in the lung, so Lasix was increased to TID yesterday. -I&O : negative 1,618 over 24h, negative 5 liters since admission -weight down from 215 on admission (1/11) to 208 today (2 lb loss since yesterday). Weight per office note in 06/2016 was 202. -Creatinine stable -Continue Lasix 80 mg IV TID diuresis - add metolazone for additional diuresis, as she remains volume overloaded.  2. Atrial fibrillation  -Previous DCCV 06/2016 with reversion right back to afib. Currently maintained on diltiazem for rate control. Outpatient notes mention possible antiarrhythmic once CHF compensated -This patients CHA2DS2-VASc Score 4 (CHF, age (1), female, HTN). She is anticoagulated with Eliquis. -Remains in a-fib, appreciate EP suggestions - will continue diuresis and consider possibly adding dofetilide later this week.  Chrystie Nose, MD, [facility] Attending Cardiologist [facility]

## 2016-09-11 LAB — BASIC METABOLIC PANEL
ANION GAP: 12 (ref 5–15)
BUN: 30 mg/dL — ABNORMAL HIGH (ref 6–20)
CO2: 36 mmol/L — AB (ref 22–32)
Calcium: 10 mg/dL (ref 8.9–10.3)
Chloride: 90 mmol/L — ABNORMAL LOW (ref 101–111)
Creatinine, Ser: 1.35 mg/dL — ABNORMAL HIGH (ref 0.44–1.00)
GFR, EST AFRICAN AMERICAN: 46 mL/min — AB (ref >60)
GFR, EST NON AFRICAN AMERICAN: 40 mL/min — AB (ref >60)
GLUCOSE: 123 mg/dL — AB (ref 65–99)
POTASSIUM: 4.6 mmol/L (ref 3.5–5.1)
Sodium: 138 mmol/L (ref 135–145)

## 2016-09-11 LAB — MAGNESIUM: Magnesium: 2.3 mg/dL (ref 1.7–2.4)

## 2016-09-11 MED ORDER — FUROSEMIDE 10 MG/ML IJ SOLN: 80.0000 mg | Freq: Two times a day (BID) | INTRAMUSCULAR | Status: DC

## 2016-09-11 MED ORDER — FUROSEMIDE 40 MG PO TABS
40.0000 mg | ORAL_TABLET | Freq: Two times a day (BID) | ORAL | Status: DC
Start: 2016-09-12 — End: 2016-09-15
  Administered 2016-09-12 – 2016-09-15 (×6): 40 mg via ORAL
  Filled 2016-09-11 (×7): qty 1

## 2016-09-11 MED ORDER — METOLAZONE 2.5 MG PO TABS
2.5000 mg | ORAL_TABLET | Freq: Every day | ORAL | Status: DC
  Administered 2016-09-12 – 2016-09-13 (×2): 2.5 mg via ORAL
  Filled 2016-09-11 (×3): qty 1

## 2016-09-11 NOTE — Progress Notes (Signed)
Patient Name: [name] Date of Encounter: 09/11/2016  Primary Cardiologist: Dr. Alanda Slim Problem List     Active Problems:   Acute on chronic diastolic CHF (congestive heart failure) [facility])    Patient Profile     [name] a 66 y.o.femalewith a history of tobacco abuse. She quit smoking several years ago. Per records, she has no history of ischemic heart disease. She had a 2D echo 01/2016 that showed normal LVEF of 60-65% with grade 1DD. Also with persistent atrial fib. She was admitted with acute on chronic diastolic dysfunction.    Subjective   Feels better today. Breathing improved. LEE also improved but mild edema present.   Inpatient Medications    Scheduled Meds: . apixaban  5 mg Oral BID  . atorvastatin  10 mg Oral q1800  . diltiazem  240 mg Oral Daily  . furosemide  80 mg Intravenous TID  . latanoprost  1 drop Both Eyes QHS  . metolazone  2.5 mg Oral Daily  . potassium chloride SA  40 mEq Oral BID  . sodium chloride flush  3 mL Intravenous Q12H  . timolol  1 drop Both Eyes Daily   Continuous Infusions:  PRN Meds: sodium chloride, acetaminophen, albuterol, HYDROcodone-acetaminophen, ondansetron (ZOFRAN) IV, sodium chloride flush   Vital Signs    Vitals:   09/10/16 1210 09/10/16 2028 09/10/16 2104 09/11/16 0554  BP: 104/66 110/80  112/60  Pulse: 65 94  96  Resp: 18     Temp: 97.8 F (36.6 C)  98.1 F (36.7 C) 98.8 F (37.1 C)  TempSrc: Oral Oral Oral Oral  SpO2: 97% 96%  97%  Weight:    206 lb 9.6 oz (93.7 kg)  Height:        Intake/Output Summary (Last 24 hours) at 09/11/16 0850 Last data filed at 09/11/16 0625  Gross per 24 hour  Intake              800 ml  Output             2500 ml  Net            -1700 ml   Filed Weights   09/09/16 0553 09/10/16 0548 09/11/16 0554  Weight: 208 lb 3.2 oz (94.4 kg) 207 lb 6.4 oz (94.1 kg) 206 lb 9.6 oz (93.7 kg)    Physical Exam   GEN: Well nourished, well developed, in no  acute distress. Moderately obese  HEENT: Grossly normal.  Neck: Supple, no JVD, carotid bruits, or masses. Cardiac: irregularly irregular, tachy rate, no murmurs, rubs, or gallops. No clubbing, or cyanosis, 1+ bilateral LEE pitting edema Radials/DP/PT 2+ and equal bilaterally.  Respiratory:  Respirations regular and unlabored, clear to auscultation bilaterally. GI: Soft, nontender, nondistended, BS + x 4. MS: no deformity or atrophy. Skin: warm and dry, no rash. Neuro:  Strength and sensation are intact. Psych: AAOx3.  Normal affect.  Labs    CBC No results for input(s): WBC, NEUTROABS, HGB, HCT, MCV, PLT in the last 72 hours. Basic Metabolic Panel  Recent Labs  09/10/16 0318 09/11/16 0438  NA 139 138  K 4.0 4.6  CL 93* 90*  CO2 36* 36*  GLUCOSE 103* 123*  BUN 27* 30*  CREATININE 0.97 1.35*  CALCIUM 9.3 10.0  MG  --  2.3   Liver Function Tests No results for input(s): AST, ALT, ALKPHOS, BILITOT, PROT, ALBUMIN in the last 72 hours. No results for input(s): LIPASE, AMYLASE  in the last 72 hours. Cardiac Enzymes No results for input(s): CKTOTAL, CKMB, CKMBINDEX, TROPONINI in the last 72 hours. BNP Invalid input(s): POCBNP D-Dimer No results for input(s): DDIMER in the last 72 hours. Hemoglobin A1C No results for input(s): HGBA1C in the last 72 hours. Fasting Lipid Panel No results for input(s): CHOL, HDL, LDLCALC, TRIG, CHOLHDL, LDLDIRECT in the last 72 hours. Thyroid Function Tests No results for input(s): TSH, T4TOTAL, T3FREE, THYROIDAB in the last 72 hours.  Invalid input(s): FREET3  Telemetry    Atrial fibrillation rates in the 110s - Personally Reviewed   Radiology    No results found.  Cardiac Studies   09/06/2016 Echo Study Conclusions  - Left ventricle: Abnormal septal motion There was moderate concentric hypertrophy. Systolic function was normal. The estimated ejection fraction was in the range of 50% to 55%. Left ventricular diastolic  function parameters were normal. - Left atrium: The atrium was moderately dilated. - Atrial septum: No defect or patent foramen ovale was identified. - Pulmonary arteries: PA peak pressure: 41 mm Hg (S).  Patient Profile     [name] a 66 y.o.femalewith a history of tobacco abuse. She quit smoking several years ago. Per records, she has no history of ischemic heart disease. She had a 2D echo 01/2016 that showed normal LVEF of 60-65% with grade 1DD. Also with persistent atrial fib. She was admitted with acute on chronic diastolic dysfunction.   Assessment & Plan    1. Acute on Chronic Diastolic CHF:   - echo Jan 2018 LVEF 50-55%, mod LAE. Normal diastolic parameters reported.  - Pt admitted with volume overload. DOE, orthopnea, increased edema. CXR on 1/12 showed some degree if CHF and possible early PNA  - Currently on IV Lasix 80 mg TID with metolazone   - She diuresed 2.5 L yesterday  - Net I/Os negative 7.7 L since admission  - Weight down 9 lb from 215>>206 lb. Weight per office note in 06/2016 was 202.  - Breathing improved. LEE improved but still with mild 1+ bilateral pitting edema  - Bump in SCr from 0.97 >>1.35. BUN up from 27 to 30 today. Monitor closely today. F/u BMP in the am. If further rise in SCr, we will back down on IV lasix. - K is WNL at 4.6.  - BP is well controlled.    2. Persistent Atrial Fibrillation:  - Remains in afib with V-rates in the 110s. - Evaluated by EP 09/09/16.  - Recommendations are for AAD therapy with either dofetilide or amiodarone - electrolytes are stable. K is 4.6, Mg 2.3 - She is on Cardizem for rate control - continue Eliquis for anticoagulation  3. OSA: - recently diagnosed  - awaiting CPAP  - this will need to be addressed post discharge and treated to prevent worsening atrial arrhythmias.   Signed, Robbie Lis, PA-C  09/11/2016, 8:50 AM

## 2016-09-11 NOTE — Progress Notes (Signed)
Pt refused bed alarm on. Will continue to do hourly rounding. 

## 2016-09-12 DIAGNOSIS — I10 Essential (primary) hypertension: Secondary | ICD-10-CM

## 2016-09-12 DIAGNOSIS — Z7901 Long term (current) use of anticoagulants: Secondary | ICD-10-CM

## 2016-09-12 LAB — BASIC METABOLIC PANEL
Anion gap: 10 (ref 5–15)
BUN: 34 mg/dL — AB (ref 6–20)
CALCIUM: 10 mg/dL (ref 8.9–10.3)
CO2: 37 mmol/L — ABNORMAL HIGH (ref 22–32)
CREATININE: 1.17 mg/dL — AB (ref 0.44–1.00)
Chloride: 91 mmol/L — ABNORMAL LOW (ref 101–111)
GFR calc Af Amer: 55 mL/min — ABNORMAL LOW (ref >60)
GFR calc non Af Amer: 47 mL/min — ABNORMAL LOW (ref >60)
GLUCOSE: 152 mg/dL — AB (ref 65–99)
Potassium: 4.2 mmol/L (ref 3.5–5.1)
Sodium: 138 mmol/L (ref 135–145)

## 2016-09-12 MED ORDER — DOFETILIDE 500 MCG PO CAPS
500.0000 ug | ORAL_CAPSULE | Freq: Two times a day (BID) | ORAL | Status: DC
  Administered 2016-09-12 – 2016-09-15 (×6): 500 ug via ORAL
  Filled 2016-09-12 (×6): qty 1

## 2016-09-12 MED ORDER — SODIUM CHLORIDE 0.9% FLUSH: 3.0000 mL | INTRAVENOUS | Status: DC | PRN

## 2016-09-12 MED ORDER — SODIUM CHLORIDE 0.9 % IV SOLN: 250.0000 mL | INTRAVENOUS | Status: DC | PRN

## 2016-09-12 MED ORDER — SODIUM CHLORIDE 0.9% FLUSH
3.0000 mL | Freq: Two times a day (BID) | INTRAVENOUS | Status: DC
  Administered 2016-09-12 – 2016-09-13 (×2): 3 mL via INTRAVENOUS

## 2016-09-12 NOTE — Progress Notes (Signed)
Pharmacy Review for Dofetilide (Tikosyn) Initiation  Admit Complaint: 66 y.o. female admitted 09/05/2016 with atrial fibrillation to be initiated on dofetilide.   Assessment:  Patient Exclusion Criteria: If any screening criteria checked as "Yes", then  patient  should NOT receive dofetilide until criteria item is corrected. If "Yes" please indicate correction plan.  YES  NO Patient  Exclusion Criteria Correction Plan  []  [x]  Baseline QTc interval is greater than or equal to 440 msec. IF above YES box checked dofetilide contraindicated unless patient has ICD; then may proceed if QTc 500-550 msec or with known ventricular conduction abnormalities may proceed with QTc 550-600 msec. QTc =   []  [x]  Magnesium level is less than 1.8 mEq/l : Last magnesium:  Lab Results  Component Value Date   MG 2.3 09/11/2016         []  [x]  Potassium level is less than 4 mEq/l : Last potassium:  Lab Results  Component Value Date   K 4.2 09/12/2016         []  [x]  Patient is known or suspected to have a digoxin level greater than 2 ng/ml: No results found for: DIGOXIN    []  [x]  Creatinine clearance less than 20 ml/min (calculated using Cockcroft-Gault, actual body weight and serum creatinine): Estimated Creatinine Clearance: 48.4 mL/min (by C-G formula based on SCr of 1.17 mg/dL (H)).   CrCL 70 ml/min based on TBW    []  [x]  Patient has received drugs known to prolong the QT intervals within the last 48 hours (phenothiazines, tricyclics or tetracyclic antidepressants, erythromycin, H-1 antihistamines, cisapride, fluoroquinolones, azithromycin). Drugs not listed above may have an, as yet, undetected potential to prolong the QT interval, updated information on QT prolonging agents is available at this website:QT prolonging agents   []  [x]  Patient received a dose of hydrochlorothiazide (Oretic) alone or in any combination including triamterene (Dyazide, Maxzide) in the last 48 hours.   []  [x]  Patient  received a medication known to increase dofetilide plasma concentrations prior to initial dofetilide dose:  . Trimethoprim (Primsol, Proloprim) in the last 36 hours . Verapamil (Calan, Verelan) in the last 36 hours or a sustained release dose in the last 72 hours . Megestrol (Megace) in the last 5 days  . Cimetidine (Tagamet) in the last 6 hours . Ketoconazole (Nizoral) in the last 24 hours . Itraconazole (Sporanox) in the last 48 hours  . Prochlorperazine (Compazine) in the last 36 hours    []  [x]  Patient is known to have a history of torsades de pointes; congenital or acquired long QT syndromes.   []  [x]  Patient has received a Class 1 antiarrhythmic with less than 2 half-lives since last dose. (Disopyramide, Quinidine, Procainamide, Lidocaine, Mexiletine, Flecainide, Propafenone)   []  [x]  Patient has received amiodarone therapy in the past 3 months or amiodarone level is greater than 0.3 ng/ml.    Patient has been appropriately anticoagulated with Eliquis.   Ordering provider was confirmed at [email or web address] if they are not listed on the [facility] Authorized Prescribers list.  Goal of Therapy: Follow renal function, electrolytes, potential drug interactions, and dose adjustment. Provide education and 1 week supply at discharge.  Plan:  [x]   Physician selected initial dose within range recommended for patients level of renal function - will monitor for response.  []   Physician selected initial dose outside of range recommended for patients level of renal function - will discuss if the dose should be altered at this time.   Select One Calculated CrCl  Dose  q12h  [x]  > 60 ml/min 500 mcg  []  40-60 ml/min 250 mcg  []  20-40 ml/min 125 mcg   2. Follow up QTc after the first 5 doses, renal function, electrolytes (K & Mg) daily x 3     days, dose adjustment, success of initiation and facilitate 1 week discharge supply as     clinically indicated.  3. Initiate Tikosyn education video (Call  [phone] and ask for Tikosyn Video # 116).  4. Place Enrollment Form on the chart for discharge supply of dofetilide.     Lenay Lovejoy D. Laney Potash, PharmD, BCPS Pager:  [phone] 09/12/2016, 12:21 PM

## 2016-09-12 NOTE — Progress Notes (Signed)
Patient Name: [name] Date of Encounter: 09/12/2016  Primary Cardiologist: Dr. Wanita Chamberlain Problem List     Principal Problem:   Acute on chronic diastolic CHF (congestive heart failure) (HCC) Active Problems:   Essential hypertension   Morbid obesity due to excess calories (HCC)   Atrial fibrillation with rapid ventricular response (HCC)   Hypercholesterolemia   Anticoagulated     Subjective   Breathing has been improved, almost back to baseline. Now off oxygen. Walking in room without dyspnea however has intermittent palpitations. No chest pain.   Inpatient Medications    Scheduled Meds: . apixaban  5 mg Oral BID  . atorvastatin  10 mg Oral q1800  . diltiazem  240 mg Oral Daily  . furosemide  40 mg Oral BID  . latanoprost  1 drop Both Eyes QHS  . metolazone  2.5 mg Oral Daily  . potassium chloride SA  40 mEq Oral BID  . sodium chloride flush  3 mL Intravenous Q12H  . timolol  1 drop Both Eyes Daily   Continuous Infusions:  PRN Meds: sodium chloride, acetaminophen, albuterol, HYDROcodone-acetaminophen, ondansetron (ZOFRAN) IV, sodium chloride flush   Vital Signs    Vitals:   09/10/16 2104 09/11/16 0554 09/11/16 1203 09/11/16 2018  BP:  112/60 117/80 122/77  Pulse:  96 96 90  Resp:   20 19  Temp: 98.1 F (36.7 C) 98.8 F (37.1 C) 97.8 F (36.6 C) 98.4 F (36.9 C)  TempSrc: Oral Oral Oral Oral  SpO2:  97% 99% 95%  Weight:  206 lb 9.6 oz (93.7 kg)    Height:        Intake/Output Summary (Last 24 hours) at 09/12/16 0941 Last data filed at 09/12/16 0910  Gross per 24 hour  Intake             1920 ml  Output             2350 ml  Net             -430 ml   Filed Weights   09/09/16 0553 09/10/16 0548 09/11/16 0554  Weight: 208 lb 3.2 oz (94.4 kg) 207 lb 6.4 oz (94.1 kg) 206 lb 9.6 oz (93.7 kg)    Physical Exam   GEN: Well nourished, well developed, in no acute distress.  HEENT: Grossly normal.  Neck: Supple, no JVD, carotid bruits, or  masses. Cardiac: IR Ir tachycardic, no murmurs, rubs, or gallops. No clubbing, cyanosis. Trace BL LE edema.  Radials/DP/PT 2+ and equal bilaterally.  Respiratory:  Respirations regular and unlabored. Diffuse course breath sound and wheezing.  GI: Soft, nontender, nondistended, BS + x 4. MS: no deformity or atrophy. Skin: warm and dry, no rash. Neuro:  Strength and sensation are intact. Psych: AAOx3.  Normal affect.  Labs    CBC No results for input(s): WBC, NEUTROABS, HGB, HCT, MCV, PLT in the last 72 hours. Basic Metabolic Panel  Recent Labs  09/11/16 0438 09/12/16 0538  NA 138 138  K 4.6 4.2  CL 90* 91*  CO2 36* 37*  GLUCOSE 123* 152*  BUN 30* 34*  CREATININE 1.35* 1.17*  CALCIUM 10.0 10.0  MG 2.3  --    Liver Function Tests No results for input(s): AST, ALT, ALKPHOS, BILITOT, PROT, ALBUMIN in the last 72 hours. No results for input(s): LIPASE, AMYLASE in the last 72 hours. Cardiac Enzymes No results for input(s): CKTOTAL, CKMB, CKMBINDEX, TROPONINI in the last 72 hours. BNP Invalid  input(s): POCBNP D-Dimer No results for input(s): DDIMER in the last 72 hours. Hemoglobin A1C No results for input(s): HGBA1C in the last 72 hours. Fasting Lipid Panel No results for input(s): CHOL, HDL, LDLCALC, TRIG, CHOLHDL, LDLDIRECT in the last 72 hours. Thyroid Function Tests No results for input(s): TSH, T4TOTAL, T3FREE, THYROIDAB in the last 72 hours.  Invalid input(s): FREET3  Telemetry    afib at rate of 90-110s - Personally Reviewed  ECG    N/a  Radiology    No results found.  Cardiac Studies   09/06/2016 Echo Study Conclusions  - Left ventricle: Abnormal septal motion There was moderate concentric hypertrophy. Systolic function was normal. The estimated ejection fraction was in the range of 50% to 55%. Left ventricular diastolic function parameters were normal. - Left atrium: The atrium was moderately dilated. - Atrial septum: No defect or patent  foramen ovale was identified. - Pulmonary arteries: PA peak pressure: 41 mm Hg (S).  Patient Profile     [name] a 66 y.o.femalewith a history of tobacco abuse. She quit smoking several years ago. Per records, she has no history of ischemic heart disease. She had a 2D echo 01/2016 that showed normal LVEF of 60-65% with grade 1DD. Also with persistent atrial fib. She was admitted with acute on chronic diastolic dysfunction.    Assessment & Plan      1. Acute on Chronic Diastolic CHF:   - Echo Jan 2018 LVEF 50-55%, mod LAE. Normal diastolic parameters reported.  - Pt admitted with volume overload. DOE, orthopnea, increased edema. CXR on 1/12 showed some degree if CHF and possible early PNA. - She has good diuresis. Due to rising creatinine discontinued IV lasix now on PO lasix 40mg  BID with metolazone. SCr improved to 1.17 from 1.35. She diuresed 570cc yesterday.  Net I/Os negative 8.1 L since admission. [name] is down 9 lb (215-->206lb).  - Breathing is now almost back to normal. Ambulating in room. Noted course breath sound with diffuse wheezing on exam today.   2. Persistent Atrial Fibrillation:  - Remains in afib with V-rates in the 90-110s. Evaluated by EP 09/09/16. Recommendations are for AAD therapy with either dofetilide or amiodarone. Likely start today per MD. K 4.2. Mg 2.1. She is on Cardizem for rate control. BP has been stable. Continue Eliquis for anticoagulation.   3. OSA: - Recently diagnosed  - awaiting CPAP  - this will need to be addressed post discharge and treated to prevent worsening atrial arrhythmias.   Signed, Manson Passey, PA  09/12/2016, 9:41 AM

## 2016-09-12 NOTE — Progress Notes (Signed)
SUBJECTIVE: The patient is doing well today.  At this time, she denies chest pain, shortness of breath, or any new concerns.  CURRENT MEDICATIONS: . apixaban  5 mg Oral BID  . atorvastatin  10 mg Oral q1800  . diltiazem  240 mg Oral Daily  . furosemide  40 mg Oral BID  . latanoprost  1 drop Both Eyes QHS  . metolazone  2.5 mg Oral Daily  . potassium chloride SA  40 mEq Oral BID  . sodium chloride flush  3 mL Intravenous Q12H  . timolol  1 drop Both Eyes Daily     OBJECTIVE: Physical Exam: Vitals:   09/10/16 2104 09/11/16 0554 09/11/16 1203 09/11/16 2018  BP:  112/60 117/80 122/77  Pulse:  96 96 90  Resp:   20 19  Temp: 98.1 F (36.7 C) 98.8 F (37.1 C) 97.8 F (36.6 C) 98.4 F (36.9 C)  TempSrc: Oral Oral Oral Oral  SpO2:  97% 99% 95%  Weight:  206 lb 9.6 oz (93.7 kg)    Height:        Intake/Output Summary (Last 24 hours) at 09/12/16 1151 Last data filed at 09/12/16 0910  Gross per 24 hour  Intake             1440 ml  Output             1950 ml  Net             -510 ml    Telemetry reveals rate controlled atrial fibrillation  GEN- The patient is well appearing, alert and oriented x 3 today.   Head- normocephalic, atraumatic Eyes-  Sclera clear, conjunctiva pink Ears- hearing intact Oropharynx- clear Neck- supple  Lungs- Clear to ausculation bilaterally, normal work of breathing Heart- Irregular rate and rhythm  GI- soft, NT, ND, + BS Extremities- no clubbing, cyanosis, or edema Skin- no rash or lesion Psych- euthymic mood, full affect Neuro- strength and sensation are intact  LABS: Basic Metabolic Panel:  Recent Labs  93/81/01 0438 09/12/16 0538  NA 138 138  K 4.6 4.2  CL 90* 91*  CO2 36* 37*  GLUCOSE 123* 152*  BUN 30* 34*  CREATININE 1.35* 1.17*  CALCIUM 10.0 10.0  MG 2.3  --     RADIOLOGY: Dg Chest 2 View Result Date: 09/06/2016 CLINICAL DATA:  Shortness of breath.  Hypertension. EXAM: CHEST  2 VIEW COMPARISON:  July 26, 2016  FINDINGS: There is interstitial edema. There is mild patchy alveolar opacity in the medial left base. The lungs elsewhere clear. Heart is mildly enlarged with pulmonary venous hypertension. No adenopathy. There is atherosclerotic calcification in the aorta. There is thoracolumbar levoscoliosis. There is degenerative change in the thoracic spine. IMPRESSION: Evidence a degree of congestive heart failure. Suspect atelectasis in the medial left base common early pneumonia in this area superimposed cannot be excluded. There is aortic atherosclerosis. Electronically Signed   By: Bretta Bang III M.D.   On: 09/06/2016 07:40    ASSESSMENT AND PLAN:  Principal Problem:   Acute on chronic diastolic CHF (congestive heart failure) (HCC) Active Problems:   Essential hypertension   Morbid obesity due to excess calories (HCC)   Atrial fibrillation with rapid ventricular response (HCC)   Hypercholesterolemia   Anticoagulated   1.  Persistent atrial fibrillation She remains in persistent atrial fibrillation which is likely exacerbating heart failure. As she is now closer to euvolemic, would start Tikosyn at this time.  Keep K >  3.9, Mg >1.8 QTc stable by last EKG, Lyrik Dockstader update today Continue Eliquis for CHADS2VASC of 3 If still in AF on Saturday, Caran Storck need DCCV   2.  Acute on chronic diastolic heart failure Now euvolemic Continue medical therapy per HF team  3.  OSA Compliance with CPAP encouraged Shiro Ellerman be important for maintenance of SR  Gypsy Balsam, NP 09/12/2016 11:51 AM  I have seen and examined this patient with Gypsy Balsam.  Agree with above, note added to reflect my findings.  On exam, regular rhythm, no murmurs, lungs clear. Presented in heart failure and also has AF. Plan has since had diuresis and respiratory status much improved. Plan for [employer].  Arlet Marter start with 500 mcg BID. QTc 406 on ECG done today. Canary Fister monitor electrolytes as well as ECG with each dose of tikosyn. May  require cardioversion Saturday if still in AF.    Ed Rayson M. Xenia Nile MD 09/12/2016 3:01 PM

## 2016-09-13 ENCOUNTER — Other Ambulatory Visit: Payer: Self-pay

## 2016-09-13 DIAGNOSIS — Z5181 Encounter for therapeutic drug level monitoring: Secondary | ICD-10-CM

## 2016-09-13 DIAGNOSIS — I483 Typical atrial flutter: Secondary | ICD-10-CM

## 2016-09-13 DIAGNOSIS — Z79899 Other long term (current) drug therapy: Secondary | ICD-10-CM

## 2016-09-13 LAB — PROTIME-INR
INR: 1.28
PROTHROMBIN TIME: 16.1 s — AB (ref 11.4–15.2)

## 2016-09-13 LAB — BASIC METABOLIC PANEL
Anion gap: 10 (ref 5–15)
Anion gap: 14 (ref 5–15)
BUN: 31 mg/dL — AB (ref 6–20)
BUN: 34 mg/dL — AB (ref 6–20)
CHLORIDE: 87 mmol/L — AB (ref 101–111)
CO2: 38 mmol/L — AB (ref 22–32)
CO2: 33 mmol/L — ABNORMAL HIGH (ref 22–32)
CREATININE: 1.17 mg/dL — AB (ref 0.44–1.00)
CREATININE: 1.31 mg/dL — AB (ref 0.44–1.00)
Calcium: 9.8 mg/dL (ref 8.9–10.3)
Calcium: 10 mg/dL (ref 8.9–10.3)
Chloride: 87 mmol/L — ABNORMAL LOW (ref 101–111)
GFR calc Af Amer: 55 mL/min — ABNORMAL LOW (ref >60)
GFR calc non Af Amer: 47 mL/min — ABNORMAL LOW (ref >60)
GFR, EST AFRICAN AMERICAN: 48 mL/min — AB (ref >60)
GFR, EST NON AFRICAN AMERICAN: 41 mL/min — AB (ref >60)
GLUCOSE: 140 mg/dL — AB (ref 65–99)
Glucose, Bld: 138 mg/dL — ABNORMAL HIGH (ref 65–99)
POTASSIUM: 4.4 mmol/L (ref 3.5–5.1)
Potassium: 3.7 mmol/L (ref 3.5–5.1)
SODIUM: 135 mmol/L (ref 135–145)
SODIUM: 134 mmol/L — AB (ref 135–145)

## 2016-09-13 LAB — MAGNESIUM: Magnesium: 2 mg/dL (ref 1.7–2.4)

## 2016-09-13 MED ORDER — SODIUM CHLORIDE 0.9% FLUSH: 3.0000 mL | INTRAVENOUS | Status: DC | PRN

## 2016-09-13 MED ORDER — METOLAZONE 2.5 MG PO TABS: 2.5000 mg | ORAL_TABLET | ORAL | Status: DC

## 2016-09-13 MED ORDER — POTASSIUM CHLORIDE CRYS ER 20 MEQ PO TBCR
40.0000 meq | EXTENDED_RELEASE_TABLET | Freq: Three times a day (TID) | ORAL | Status: DC
  Administered 2016-09-13 – 2016-09-15 (×6): 40 meq via ORAL
  Filled 2016-09-13 (×6): qty 2

## 2016-09-13 MED ORDER — SODIUM CHLORIDE 0.9 % IV SOLN
INTRAVENOUS | Status: DC
  Administered 2016-09-14: 05:00:00 via INTRAVENOUS

## 2016-09-13 MED ORDER — SODIUM CHLORIDE 0.9 % IV SOLN: 250.0000 mL | INTRAVENOUS | Status: DC

## 2016-09-13 MED ORDER — SODIUM CHLORIDE 0.9% FLUSH
3.0000 mL | Freq: Two times a day (BID) | INTRAVENOUS | Status: DC
  Administered 2016-09-13: 3 mL via INTRAVENOUS

## 2016-09-13 NOTE — Care Management Important Message (Signed)
Important Message  Patient Details  Name: [name] MRN: [id number] Date of Birth: March 29, 1951   Medicare Important Message Given:  Yes    Joziyah Roblero 09/13/2016, 1:12 PM

## 2016-09-13 NOTE — Progress Notes (Signed)
Patient Name: [name] Date of Encounter: 09/13/2016  Primary Cardiologist: Dr. [name] EP: Dr. [facility] Problem List     Principal Problem:   Acute on chronic diastolic CHF (congestive heart failure) [facility]) Active Problems:   Essential hypertension   Morbid obesity due to excess calories (HCC)   Atrial fibrillation with rapid ventricular response (HCC)   Hypercholesterolemia   Anticoagulated     Subjective   Breathing improved. Intermittently using oxygen. Now in aflutter.   Inpatient Medications    Scheduled Meds: . apixaban  5 mg Oral BID  . atorvastatin  10 mg Oral q1800  . diltiazem  240 mg Oral Daily  . dofetilide  500 mcg Oral BID  . furosemide  40 mg Oral BID  . latanoprost  1 drop Both Eyes QHS  . metolazone  2.5 mg Oral Daily  . potassium chloride SA  40 mEq Oral BID  . sodium chloride flush  3 mL Intravenous Q12H  . sodium chloride flush  3 mL Intravenous Q12H  . timolol  1 drop Both Eyes Daily   Continuous Infusions:  PRN Meds: sodium chloride, sodium chloride, acetaminophen, albuterol, HYDROcodone-acetaminophen, ondansetron (ZOFRAN) IV, sodium chloride flush, sodium chloride flush   Vital Signs    Vitals:   09/12/16 1205 09/12/16 2056 09/13/16 0314 09/13/16 0806  BP: 122/69 116/77 113/70 113/79  Pulse: (!) 106 90 (!) 101   Resp: 20 18 18    Temp:  97.5 F (36.4 C) 98.1 F (36.7 C)   TempSrc:  Oral Oral   SpO2: 98% 98% 96%   Weight:   205 lb 3.2 oz (93.1 kg)   Height:        Intake/Output Summary (Last 24 hours) at 09/13/16 0849 Last data filed at 09/13/16 0729  Gross per 24 hour  Intake             1542 ml  Output             2600 ml  Net            -1058 ml   Filed Weights   09/10/16 0548 09/11/16 0554 09/13/16 0314  Weight: 207 lb 6.4 oz (94.1 kg) 206 lb 9.6 oz (93.7 kg) 205 lb 3.2 oz (93.1 kg)    Physical Exam   GEN: Well nourished, well developed, in no acute distress.  HEENT: Grossly normal.  Neck: Supple, no  JVD, carotid bruits, or masses. Cardiac:  RRR, no murmurs, rubs, or gallops. No clubbing, cyanosis, edema.  Radials/DP/PT 2+ and equal bilaterally.  Respiratory:  Respirations regular and unlabored. Diffuse course breath sound and wheezing.  GI: Soft, nontender, nondistended, BS + x 4. MS: no deformity or atrophy. Skin: warm and dry, no rash. Neuro:  Strength and sensation are intact. Psych: AAOx3.  Normal affect.  Labs    CBC No results for input(s): WBC, NEUTROABS, HGB, HCT, MCV, PLT in the last 72 hours. Basic Metabolic Panel  Recent Labs  09/11/16 0438 09/12/16 0538 09/13/16 0535  NA 138 138 135  K 4.6 4.2 3.7  CL 90* 91* 87*  CO2 36* 37* 38*  GLUCOSE 123* 152* 140*  BUN 30* 34* 31*  CREATININE 1.35* 1.17* 1.17*  CALCIUM 10.0 10.0 9.8  MG 2.3  --  2.0   Liver Function Tests No results for input(s): AST, ALT, ALKPHOS, BILITOT, PROT, ALBUMIN in the last 72 hours. No results for input(s): LIPASE, AMYLASE in the last 72 hours. Cardiac Enzymes No results  for input(s): CKTOTAL, CKMB, CKMBINDEX, TROPONINI in the last 72 hours. BNP Invalid input(s): POCBNP D-Dimer No results for input(s): DDIMER in the last 72 hours. Hemoglobin A1C No results for input(s): HGBA1C in the last 72 hours. Fasting Lipid Panel No results for input(s): CHOL, HDL, LDLCALC, TRIG, CHOLHDL, LDLDIRECT in the last 72 hours. Thyroid Function Tests No results for input(s): TSH, T4TOTAL, T3FREE, THYROIDAB in the last 72 hours.  Invalid input(s): FREET3  Telemetry    Aflutter at 90s - Personally Reviewed  ECG    N/a  Radiology    No results found.  Cardiac Studies   09/06/2016 Echo Study Conclusions  - Left ventricle: Abnormal septal motion There was moderate concentric hypertrophy. Systolic function was normal. The estimated ejection fraction was in the range of 50% to 55%. Left ventricular diastolic function parameters were normal. - Left atrium: The atrium was moderately  dilated. - Atrial septum: No defect or patent foramen ovale was identified. - Pulmonary arteries: PA peak pressure: 41 mm Hg (S).  Patient Profile     [name] a 66 y.o.femalewith a history of tobacco abuse. She quit smoking several years ago. Per records, she has no history of ischemic heart disease. She had a 2D echo 01/2016 that showed normal LVEF of 60-65% with grade 1DD. Also with persistent atrial fib. She was admitted with acute on chronic diastolic dysfunction.    Assessment & Plan      1. Acute on Chronic Diastolic CHF:   - Echo Jan 2018 LVEF 50-55%, mod LAE. Normal diastolic parameters reported.  - Pt admitted with volume overload. DOE, orthopnea, increased edema. CXR on 1/12 showed some degree if CHF and possible early PNA. - She has good diuresis. Due to rising creatinine discontinued IV lasix now on PO lasix 40mg  BID with metolazone once a dat. SCr stable. She diuresed 958cc yesterday.  Net I/Os negative 9.3 L since admission. [name] is down 10 lb (215-->205lb).  - Breathing is now almost back to normal. Using oxygen intermittently. She will benefits from PT eventuation prior to discharge.  2. Persistent Atrial Fibrillation, now aflutter - Patient has been evaluated by EP and now on Tikosyn. EKG this morning read as ST elevated and new aflutter. No acute MI despite machine reading.  Continue Eliquis for anticoagulation. Per EP.   3. OSA: - Recently diagnosed, awaiting CPAP. Important to maintain sinus rhythm.   Signed, Manson Passey, PA  09/13/2016, 8:49 AM

## 2016-09-13 NOTE — Progress Notes (Signed)
    Called because ECG read acute MI.  I personally reviewed the ECG.  Patient is now n atrial flutter from atrial fibrillation.  Patient asymptomatic per nurses report.  On Eliquis.  No CP.    No acute MI despite machine reading.    Tikosyn dosing per EP.  Corky Crafts, MD

## 2016-09-13 NOTE — Progress Notes (Signed)
RE: Benefit check    Mardene Sayer CMA        S/W KRISTI @ [employer] # [phone]   1. TIKOSYN 500 MCG BID  COVER- NO - NONE FORMULARY  PRIOR APPROVAL-YES # [phone]   2. DOFETILIDE 500 MCG BID  COVER- YES  CO-PAY- $ 95.00  TIER- 4 DRUG  PRIOR APPROVAL - NO   PHARMACY : [facility]

## 2016-09-13 NOTE — Care Management Important Message (Signed)
Important Message  Patient Details  Name: [name] MRN: [id number] Date of Birth: 1951-06-09   Medicare Important Message Given:  Yes    [name] [name] 09/13/2016, 1:11 PM

## 2016-09-13 NOTE — Progress Notes (Signed)
EKG was done 2.5 hrs after giving 1st dose of Tikosyn 500 mcg PO. QTc was 442. Will continue to monitor pt

## 2016-09-13 NOTE — Progress Notes (Addendum)
SUBJECTIVE: The patient is stable today. Still with shortness of breath with exertion.  Intermittently needing O2. She says she has been walking around in room some.   CURRENT MEDICATIONS: . apixaban  5 mg Oral BID  . atorvastatin  10 mg Oral q1800  . diltiazem  240 mg Oral Daily  . dofetilide  500 mcg Oral BID  . furosemide  40 mg Oral BID  . latanoprost  1 drop Both Eyes QHS  . metolazone  2.5 mg Oral Daily  . potassium chloride SA  40 mEq Oral TID  . sodium chloride flush  3 mL Intravenous Q12H  . sodium chloride flush  3 mL Intravenous Q12H  . timolol  1 drop Both Eyes Daily     OBJECTIVE: Physical Exam: Vitals:   09/12/16 1205 09/12/16 2056 09/13/16 0314 09/13/16 0806  BP: 122/69 116/77 113/70 113/79  Pulse: (!) 106 90 (!) 101   Resp: 20 18 18    Temp:  97.5 F (36.4 C) 98.1 F (36.7 C)   TempSrc:  Oral Oral   SpO2: 98% 98% 96%   Weight:   205 lb 3.2 oz (93.1 kg)   Height:        Intake/Output Summary (Last 24 hours) at 09/13/16 1029 Last data filed at 09/13/16 2956  Gross per 24 hour  Intake             1542 ml  Output             2500 ml  Net             -958 ml    Telemetry reveals atrial flutter with RVR  GEN- The patient is elderly and obese appearing, alert and oriented x 3 today.   Head- normocephalic, atraumatic Eyes-  Sclera clear, conjunctiva pink Ears- hearing intact Oropharynx- clear Neck- supple  Lungs- Clear to ausculation bilaterally, normal work of breathing Heart- Tachycardic regular rate and rhythm  GI- soft, NT, ND, + BS Extremities- no clubbing, cyanosis, or edema Skin- no rash or lesion Psych- euthymic mood, full affect Neuro- strength and sensation are intact  LABS: Basic Metabolic Panel:  Recent Labs  21/30/86 0438 09/12/16 0538 09/13/16 0535  NA 138 138 135  K 4.6 4.2 3.7  CL 90* 91* 87*  CO2 36* 37* 38*  GLUCOSE 123* 152* 140*  BUN 30* 34* 31*  CREATININE 1.35* 1.17* 1.17*  CALCIUM 10.0 10.0 9.8  MG 2.3  --   2.0    RADIOLOGY: Dg Chest 2 View Result Date: 09/06/2016 CLINICAL DATA:  Shortness of breath.  Hypertension. EXAM: CHEST  2 VIEW COMPARISON:  July 26, 2016 FINDINGS: There is interstitial edema. There is mild patchy alveolar opacity in the medial left base. The lungs elsewhere clear. Heart is mildly enlarged with pulmonary venous hypertension. No adenopathy. There is atherosclerotic calcification in the aorta. There is thoracolumbar levoscoliosis. There is degenerative change in the thoracic spine. IMPRESSION: Evidence a degree of congestive heart failure. Suspect atelectasis in the medial left base common early pneumonia in this area superimposed cannot be excluded. There is aortic atherosclerosis. Electronically Signed   By: Bretta Bang III M.D.   On: 09/06/2016 07:40    ASSESSMENT AND PLAN:  Principal Problem:   Acute on chronic diastolic CHF (congestive heart failure) (HCC) Active Problems:   Essential hypertension   Morbid obesity due to excess calories (HCC)   Atrial fibrillation with rapid ventricular response (HCC)   Hypercholesterolemia   Anticoagulated  1.  Persistent atrial fibrillation/flutter She remains in persistent atrial fibrillation which is likely exacerbating heart failure.   Continue Tikosyn twice daily - QTc stable by manual measurement today  Keep K >3.9, Mg >1.8 Continue Eliquis for CHADS2VASC of 3 If still in AF tomorrow, will need DCCV.  NPO after midnight tonight.   2.  Acute on chronic diastolic heart failure Now euvolemic Continue medical therapy per primary team   3.  OSA Compliance with CPAP encouraged Will be important for maintenance of SR  Gypsy Balsam, NP 09/13/2016 10:31 AM  Orders written for cardioversion Diuretics discussed with Dr Rennis Golden

## 2016-09-13 NOTE — Progress Notes (Signed)
EKG done,change of rhythm from Afib to Aflutter and suspicious of acute MI. VS taken,denies chest pain/any kind of pain, asymptomatic. Dr. [name] was notified and said not a true MI. Will continue to monitor pt   09/13/16 0314  Vitals  Temp 98.1 F (36.7 C)  Temp Source Oral  BP 113/70  BP Location Right Arm  BP Method Automatic  Pulse Rate (!) 101  Pulse Rate Source Dinamap  Resp 18  Oxygen Therapy  SpO2 96 %  O2 Device Nasal Cannula  O2 Flow Rate (L/min) 2 L/min  Pain Assessment  Pain Assessment No/denies pain  Height and Weight  Weight 93.1 kg (205 lb 3.2 oz) (scale b)  Type of Scale Used Standing

## 2016-09-14 ENCOUNTER — Other Ambulatory Visit: Payer: Self-pay

## 2016-09-14 ENCOUNTER — Encounter (HOSPITAL_COMMUNITY)
Admission: AD | Disposition: A | Discharge status: Home / Self Care | Payer: Self-pay | Source: Ambulatory Visit | Attending: Cardiovascular Disease

## 2016-09-14 ENCOUNTER — Inpatient Hospital Stay (HOSPITAL_COMMUNITY): Payer: Medicare Other | Admitting: Certified Registered"

## 2016-09-14 DIAGNOSIS — G4733 Obstructive sleep apnea (adult) (pediatric): Secondary | ICD-10-CM

## 2016-09-14 DIAGNOSIS — I4819 Other persistent atrial fibrillation: Secondary | ICD-10-CM

## 2016-09-14 HISTORY — PX: CARDIOVERSION: SHX1299

## 2016-09-14 LAB — MAGNESIUM: Magnesium: 2.2 mg/dL (ref 1.7–2.4)

## 2016-09-14 LAB — SURGICAL PCR SCREEN
MRSA, PCR: NEGATIVE
Staphylococcus aureus: NEGATIVE

## 2016-09-14 LAB — BASIC METABOLIC PANEL
Anion gap: 14 (ref 5–15)
BUN: 39 mg/dL — AB (ref 6–20)
CHLORIDE: 88 mmol/L — AB (ref 101–111)
CO2: 35 mmol/L — ABNORMAL HIGH (ref 22–32)
CREATININE: 1.26 mg/dL — AB (ref 0.44–1.00)
Calcium: 9.9 mg/dL (ref 8.9–10.3)
GFR calc Af Amer: 50 mL/min — ABNORMAL LOW (ref >60)
GFR calc non Af Amer: 43 mL/min — ABNORMAL LOW (ref >60)
GLUCOSE: 120 mg/dL — AB (ref 65–99)
Potassium: 4.6 mmol/L (ref 3.5–5.1)
SODIUM: 137 mmol/L (ref 135–145)

## 2016-09-14 SURGERY — CARDIOVERSION
Anesthesia: General

## 2016-09-14 MED ORDER — LIDOCAINE 2% (20 MG/ML) 5 ML SYRINGE
INTRAMUSCULAR | Status: DC | PRN
  Administered 2016-09-14: 40 mg via INTRAVENOUS

## 2016-09-14 MED ORDER — PROMETHAZINE HCL 25 MG/ML IJ SOLN: 6.2500 mg | INTRAMUSCULAR | Status: DC | PRN

## 2016-09-14 MED ORDER — PROPOFOL 10 MG/ML IV BOLUS
INTRAVENOUS | Status: DC | PRN
  Administered 2016-09-14: 50 mg via INTRAVENOUS

## 2016-09-14 MED ORDER — LACTATED RINGERS IV SOLN
INTRAVENOUS | Status: DC
  Administered 2016-09-14: 21:00:00 via INTRAVENOUS

## 2016-09-14 NOTE — H&P (View-Only) (Signed)
SUBJECTIVE: The patient is doing well today.  At this time, she denies chest pain, shortness of breath, or any new concerns.  Marland Kitchen apixaban  5 mg Oral BID  . atorvastatin  10 mg Oral q1800  . diltiazem  240 mg Oral Daily  . dofetilide  500 mcg Oral BID  . furosemide  40 mg Oral BID  . latanoprost  1 drop Both Eyes QHS  . [START ON 09/16/2016] metolazone  2.5 mg Oral Once per day on Mon Thu  . potassium chloride SA  40 mEq Oral TID  . timolol  1 drop Both Eyes Daily   . sodium chloride 50 mL/hr at 09/14/16 0527    OBJECTIVE: Physical Exam: Vitals:   09/13/16 0806 09/13/16 1203 09/13/16 2018 09/14/16 0530  BP: 113/79 114/72 98/76 111/71  Pulse:  84 99 100  Resp:  16 18 18   Temp:  98.1 F (36.7 C) 98.2 F (36.8 C) 97.7 F (36.5 C)  TempSrc:  Oral Oral Oral  SpO2:  95% 99% 95%  Weight:    203 lb 11.2 oz (92.4 kg)  Height:        Intake/Output Summary (Last 24 hours) at 09/14/16 0816 Last data filed at 09/14/16 7672  Gross per 24 hour  Intake              960 ml  Output             2760 ml  Net            -1800 ml    Telemetry reveals afib this am (rate controlled)  GEN- The patient is obese and chronically ill appearing, wearing O2, alert and oriented x 3 today.   Head- normocephalic, atraumatic Eyes-  Sclera clear, conjunctiva pink Ears- hearing intact Oropharynx- clear Neck- supple,  Lungs- Clear to ausculation bilaterally, normal work of breathing Heart- irregular rate and rhythm  GI- soft, NT, ND, + BS Extremities- no clubbing, cyanosis, + dependant edema Skin- no rash or lesion Psych- euthymic mood, full affect Neuro- strength and sensation are intact  LABS: Basic Metabolic Panel:  Recent Labs  09/47/09 0535 09/13/16 1223 09/14/16 0652  NA 135 134* 137  K 3.7 4.4 4.6  CL 87* 87* 88*  CO2 38* 33* 35*  GLUCOSE 140* 138* 120*  BUN 31* 34* 39*  CREATININE 1.17* 1.31* 1.26*  CALCIUM 9.8 10.0 9.9  MG 2.0  --  2.2   RADIOLOGY: Dg Chest 2  View  Result Date: 09/06/2016 CLINICAL DATA:  Shortness of breath.  Hypertension. EXAM: CHEST  2 VIEW COMPARISON:  July 26, 2016 FINDINGS: There is interstitial edema. There is mild patchy alveolar opacity in the medial left base. The lungs elsewhere clear. Heart is mildly enlarged with pulmonary venous hypertension. No adenopathy. There is atherosclerotic calcification in the aorta. There is thoracolumbar levoscoliosis. There is degenerative change in the thoracic spine. IMPRESSION: Evidence a degree of congestive heart failure. Suspect atelectasis in the medial left base common early pneumonia in this area superimposed cannot be excluded. There is aortic atherosclerosis. Electronically Signed   By: Bretta Bang III M.D.   On: 09/06/2016 07:40    ASSESSMENT AND PLAN:  1. Persistent atrial fibrillation/flutter She remains in persistent atrial fibrillation which is likely exacerbating heart failure.   Continue Tikosyn twice daily Risks of cardioversion discussed with the patient. She wishes to proceed at this time. She reports compliance with anticoagulation without interruption. Follow QTc in sinus Anticipate likely discharge  tomorrow   2. Acute on chronic diastolic heart failure Now euvolemic Continue current medicine regimen Strict Is and Os Daily weights  3. OSA Compliance with CPAP encouraged Will be important for maintenance of SR  4. Obesity Body mass index is 39.78 kg/m. Lifestyle modification is necessary long term to maintain sinus rhythm  Anticipate discharge tomorrow Will need close outpatient follow-up in the AF clinic  Hillis Range, MD 09/14/2016 8:16 AM

## 2016-09-14 NOTE — Progress Notes (Signed)
After the cardioversion test today pt is running NSR throughout the day, feeling better, Oxygen is weaned to 1l from 2l, vitals stable, this morning refused to take lasix since she was going for the test but she received the evening dose, IV drip discontinue, will continue to monitor the patient.

## 2016-09-14 NOTE — Transfer of Care (Signed)
Immediate Anesthesia Transfer of Care Note  Patient: [name]  Procedure(s) Performed: Procedure(s): CARDIOVERSION (N/A)  Patient Location: PACU  Anesthesia Type:General  Level of Consciousness: awake, alert , oriented and patient cooperative  Airway & Oxygen Therapy: Patient Spontanous Breathing and Patient connected to nasal cannula oxygen  Post-op Assessment: Report given to RN, Post -op Vital signs reviewed and stable and Patient moving all extremities  Post vital signs: Reviewed and stable  Last Vitals:  Vitals:   09/13/16 2018 09/14/16 0530  BP: 98/76 111/71  Pulse: 99 100  Resp: 18 18  Temp: 36.8 C 36.5 C    Last Pain:  Vitals:   09/14/16 0813  TempSrc:   PainSc: 0-No pain      Patients Stated Pain Goal: 2 (09/13/16 1435)  Complications: No apparent anesthesia complications

## 2016-09-14 NOTE — Progress Notes (Addendum)
SUBJECTIVE: The patient is doing well today.  At this time, she denies chest pain, shortness of breath, or any new concerns.  Marland Kitchen apixaban  5 mg Oral BID  . atorvastatin  10 mg Oral q1800  . diltiazem  240 mg Oral Daily  . dofetilide  500 mcg Oral BID  . furosemide  40 mg Oral BID  . latanoprost  1 drop Both Eyes QHS  . [START ON 09/16/2016] metolazone  2.5 mg Oral Once per day on Mon Thu  . potassium chloride SA  40 mEq Oral TID  . timolol  1 drop Both Eyes Daily   . sodium chloride 50 mL/hr at 09/14/16 0527    OBJECTIVE: Physical Exam: Vitals:   09/13/16 0806 09/13/16 1203 09/13/16 2018 09/14/16 0530  BP: 113/79 114/72 98/76 111/71  Pulse:  84 99 100  Resp:  16 18 18   Temp:  98.1 F (36.7 C) 98.2 F (36.8 C) 97.7 F (36.5 C)  TempSrc:  Oral Oral Oral  SpO2:  95% 99% 95%  Weight:    203 lb 11.2 oz (92.4 kg)  Height:        Intake/Output Summary (Last 24 hours) at 09/14/16 0816 Last data filed at 09/14/16 7672  Gross per 24 hour  Intake              960 ml  Output             2760 ml  Net            -1800 ml    Telemetry reveals afib this am (rate controlled)  GEN- The patient is obese and chronically ill appearing, wearing O2, alert and oriented x 3 today.   Head- normocephalic, atraumatic Eyes-  Sclera clear, conjunctiva pink Ears- hearing intact Oropharynx- clear Neck- supple,  Lungs- Clear to ausculation bilaterally, normal work of breathing Heart- irregular rate and rhythm  GI- soft, NT, ND, + BS Extremities- no clubbing, cyanosis, + dependant edema Skin- no rash or lesion Psych- euthymic mood, full affect Neuro- strength and sensation are intact  LABS: Basic Metabolic Panel:  Recent Labs  09/47/09 0535 09/13/16 1223 09/14/16 0652  NA 135 134* 137  K 3.7 4.4 4.6  CL 87* 87* 88*  CO2 38* 33* 35*  GLUCOSE 140* 138* 120*  BUN 31* 34* 39*  CREATININE 1.17* 1.31* 1.26*  CALCIUM 9.8 10.0 9.9  MG 2.0  --  2.2   RADIOLOGY: Dg Chest 2  View  Result Date: 09/06/2016 CLINICAL DATA:  Shortness of breath.  Hypertension. EXAM: CHEST  2 VIEW COMPARISON:  July 26, 2016 FINDINGS: There is interstitial edema. There is mild patchy alveolar opacity in the medial left base. The lungs elsewhere clear. Heart is mildly enlarged with pulmonary venous hypertension. No adenopathy. There is atherosclerotic calcification in the aorta. There is thoracolumbar levoscoliosis. There is degenerative change in the thoracic spine. IMPRESSION: Evidence a degree of congestive heart failure. Suspect atelectasis in the medial left base common early pneumonia in this area superimposed cannot be excluded. There is aortic atherosclerosis. Electronically Signed   By: Bretta Bang III M.D.   On: 09/06/2016 07:40    ASSESSMENT AND PLAN:  1. Persistent atrial fibrillation/flutter She remains in persistent atrial fibrillation which is likely exacerbating heart failure.   Continue Tikosyn twice daily Risks of cardioversion discussed with the patient. She wishes to proceed at this time. She reports compliance with anticoagulation without interruption. Follow QTc in sinus Anticipate likely discharge  tomorrow   2. Acute on chronic diastolic heart failure Now euvolemic Continue current medicine regimen Strict Is and Os Daily weights  3. OSA Compliance with CPAP encouraged Will be important for maintenance of SR  4. Obesity Body mass index is 39.78 kg/m. Lifestyle modification is necessary long term to maintain sinus rhythm  Anticipate discharge tomorrow Will need close outpatient follow-up in the AF clinic  Hillis Range, MD 09/14/2016 8:16 AM   Addendum  Unfortunately, she remains in afib post cardioversion. Will plan to continue tikosyn and reassess in 1 week in AF clinic Hopefull discharge once respiratory status is improved (83% on RA today)  Hillis Range MD, [facility] 09/14/2016 8:50 AM

## 2016-09-14 NOTE — Anesthesia Postprocedure Evaluation (Addendum)
Anesthesia Post Note  Patient: JAMILET DARLEY  Procedure(s) Performed: Procedure(s) (LRB): CARDIOVERSION (N/A)  Patient location during evaluation: PACU Anesthesia Type: General Level of consciousness: awake and alert Pain management: pain level controlled Vital Signs Assessment: post-procedure vital signs reviewed and stable Respiratory status: spontaneous breathing, nonlabored ventilation, respiratory function stable and patient connected to nasal cannula oxygen Cardiovascular status: blood pressure returned to baseline and stable Postop Assessment: no signs of nausea or vomiting Anesthetic complications: no       Last Vitals:  Vitals:   09/14/16 0850 09/14/16 0900  BP: 102/74 105/80  Pulse: (!) 102 (!) 111  Resp: (!) 25 (!) 28  Temp: 36.4 C     Last Pain:  Vitals:   09/14/16 0900  TempSrc:   PainSc: 0-No pain                 Shelton Silvas

## 2016-09-14 NOTE — Anesthesia Preprocedure Evaluation (Signed)
Anesthesia Evaluation  Patient identified by MRN, date of birth, ID band Patient awake    Reviewed: Allergy & Precautions, NPO status , Patient's Chart, lab work & pertinent test results, reviewed documented beta blocker date and time   Airway Mallampati: II  TM Distance: >3 FB Neck ROM: Full    Dental  (+) Teeth Intact, Dental Advisory Given   Pulmonary shortness of breath, pneumonia, COPD, former smoker,     + decreased breath sounds      Cardiovascular hypertension, Pt. on medications and Pt. on home beta blockers +CHF  + dysrhythmias Atrial Fibrillation  Rhythm:Regular Rate:Normal     Neuro/Psych PSYCHIATRIC DISORDERS Depression    GI/Hepatic negative GI ROS, Neg liver ROS,   Endo/Other  Hyperthyroidism   Renal/GU negative Renal ROS  negative genitourinary   Musculoskeletal  (+) Arthritis , Osteoarthritis,    Abdominal   Peds negative pediatric ROS (+)  Hematology negative hematology ROS (+)   Anesthesia Other Findings   Reproductive/Obstetrics negative OB ROS                             08/2016 EKG: atrial flutter.   Anesthesia Physical  Anesthesia Plan  ASA: III  Anesthesia Plan: General   Post-op Pain Management:    Induction: Intravenous  Airway Management Planned: Natural Airway  Additional Equipment:   Intra-op Plan:   Post-operative Plan:   Informed Consent: I have reviewed the patients History and Physical, chart, labs and discussed the procedure including the risks, benefits and alternatives for the proposed anesthesia with the patient or authorized representative who has indicated his/her understanding and acceptance.     Plan Discussed with: CRNA  Anesthesia Plan Comments:         Anesthesia Quick Evaluation

## 2016-09-14 NOTE — Progress Notes (Signed)
Pt. Educated about bed alarm and safety during the nights. She refused bed alarm at this time. We will continue to monitor.  Idrees Quam, RN

## 2016-09-14 NOTE — Progress Notes (Signed)
According to day shift patient was back to NSR. Currently patient is back to AF(since 16:39pm).  MD Tiburcio Pea( cardiology) made aware. No new orders. Will continue to monitor.   Claude Waldman, RN

## 2016-09-14 NOTE — CV Procedure (Signed)
EP procedure Note   Pre procedure Diagnosis:  Persistent Atrial fibrillation Post procedure Diagnosis:  Same  Procedures:  Electrical cardioversion  Description:  Informed, written consent was obtained for cardioversion.  Adequate IV acces and airway support were assured.  The patient was adequately sedated with intravenous propofol as outlined in the anesthesia report.  The patient presented today in atrial fibrillation.  She was initially cardioverted to sinus rhythm with a single synchronized biphasic 200J shock delivered with cardioversion electrodes placed in the anterior/posterior configuration.  She immediately returned to atrial fibrillation.  A second 200J shock was delivered.  She was successfully converted to sinus rhythm but with PACs in bigeminy.  After 15 seconds, she returned to atrial fibrillation.  A third 200J shock was therefore delivered.  Again, she did convert to sinus rhythm but with PACs in bigeminy.  After about 10 seconds she returned to atrial fibrillation.  She remained in atrial fibrillation thereafter. EBL 49ml. There were no early apparent complications.  Conclusions:  1.  Successful cardioversion of afib to sinus rhythm but with early recurrence of atrial fibrillation   2.  No early apparent complications.   Kristine Tiley,MD 8:45 AM 09/14/2016

## 2016-09-14 NOTE — Interval H&P Note (Signed)
History and Physical Interval Note:  09/14/2016 8:20 AM  [name]  has presented today for surgery, with the diagnosis of ATRIAL FIB  The various methods of treatment have been discussed with the patient and family. After consideration of risks, benefits and other options for treatment, the patient has consented to  Procedure(s): CARDIOVERSION (N/A) as a surgical intervention .  The patient's history has been reviewed, patient examined, no change in status, stable for surgery.  I have reviewed the patient's chart and labs.  Questions were answered to the patient's satisfaction.     Hillis Range

## 2016-09-15 ENCOUNTER — Inpatient Hospital Stay (HOSPITAL_COMMUNITY): Payer: Medicare Other

## 2016-09-15 ENCOUNTER — Other Ambulatory Visit: Payer: Self-pay | Admitting: Cardiology

## 2016-09-15 LAB — BASIC METABOLIC PANEL
Anion gap: 9 (ref 5–15)
BUN: 32 mg/dL — AB (ref 6–20)
CHLORIDE: 91 mmol/L — AB (ref 101–111)
CO2: 35 mmol/L — AB (ref 22–32)
CREATININE: 1.19 mg/dL — AB (ref 0.44–1.00)
Calcium: 9.3 mg/dL (ref 8.9–10.3)
GFR calc Af Amer: 54 mL/min — ABNORMAL LOW (ref >60)
GFR calc non Af Amer: 47 mL/min — ABNORMAL LOW (ref >60)
Glucose, Bld: 101 mg/dL — ABNORMAL HIGH (ref 65–99)
Potassium: 4.5 mmol/L (ref 3.5–5.1)
Sodium: 135 mmol/L (ref 135–145)

## 2016-09-15 LAB — MAGNESIUM: Magnesium: 1.8 mg/dL (ref 1.7–2.4)

## 2016-09-15 MED ORDER — DOFETILIDE 500 MCG PO CAPS: 500.0000 ug | ORAL_CAPSULE | Freq: Two times a day (BID) | ORAL | 3 refills | Status: DC

## 2016-09-15 MED ORDER — ACETAMINOPHEN 500 MG PO TABS: 500.0000 mg | ORAL_TABLET | Freq: Four times a day (QID) | ORAL | 0 refills | Status: DC | PRN

## 2016-09-15 MED ORDER — POTASSIUM CHLORIDE CRYS ER 20 MEQ PO TBCR: 40.0000 meq | EXTENDED_RELEASE_TABLET | Freq: Two times a day (BID) | ORAL | 3 refills | Status: DC

## 2016-09-15 MED ORDER — FUROSEMIDE 40 MG PO TABS: 40.0000 mg | ORAL_TABLET | Freq: Two times a day (BID) | ORAL | 3 refills | Status: DC

## 2016-09-15 MED ORDER — METOLAZONE 2.5 MG PO TABS: 2.5000 mg | ORAL_TABLET | ORAL | 2 refills | Status: DC

## 2016-09-15 NOTE — Progress Notes (Signed)
SUBJECTIVE: The patient is doing well today.  Now back in sinus rhythm and feeling better.  SOB is much better.  At this time, she denies chest pain or any new concerns.  Marland Kitchen apixaban  5 mg Oral BID  . atorvastatin  10 mg Oral q1800  . diltiazem  240 mg Oral Daily  . dofetilide  500 mcg Oral BID  . furosemide  40 mg Oral BID  . latanoprost  1 drop Both Eyes QHS  . [START ON 09/16/2016] metolazone  2.5 mg Oral Once per day on Mon Thu  . potassium chloride SA  40 mEq Oral TID  . timolol  1 drop Both Eyes Daily     OBJECTIVE: Physical Exam: Vitals:   09/14/16 2036 09/15/16 0408 09/15/16 0441 09/15/16 1203  BP: 115/71 (!) 94/58 109/85 121/83  Pulse: 70 (!) 111 (!) 107 71  Resp: 20 19 18 18   Temp:  97.6 F (36.4 C)  97.4 F (36.3 C)  TempSrc:  Oral  Oral  SpO2: 93% 95%  91%  Weight:  206 lb 4.8 oz (93.6 kg)    Height:        Intake/Output Summary (Last 24 hours) at 09/15/16 1245 Last data filed at 09/15/16 1106  Gross per 24 hour  Intake             1060 ml  Output             3750 ml  Net            -2690 ml    Telemetry reveals afib has converted to sinus rhythm GEN- The patient is obese and chronically ill appearing,  alert and oriented x 3 today.   Head- normocephalic, atraumatic Eyes-  Sclera clear, conjunctiva pink Ears- hearing intact Oropharynx- clear Neck- supple,  Lungs- Clear to ausculation bilaterally, normal work of breathing Heart- regular rate and rhythm  GI- soft, NT, ND, + BS Extremities- no clubbing, cyanosis, + dependant edema Skin- no rash or lesion Psych- euthymic mood, full affect Neuro- strength and sensation are intact  LABS: Basic Metabolic Panel:  Recent Labs  54/09/81 0652 09/15/16 0718  NA 137 135  K 4.6 4.5  CL 88* 91*  CO2 35* 35*  GLUCOSE 120* 101*  BUN 39* 32*  CREATININE 1.26* 1.19*  CALCIUM 9.9 9.3  MG 2.2 1.8   RADIOLOGY: Dg Chest 2 View  Result Date: 09/06/2016 CLINICAL DATA:  Shortness of breath.  Hypertension.  EXAM: CHEST  2 VIEW COMPARISON:  July 26, 2016 FINDINGS: There is interstitial edema. There is mild patchy alveolar opacity in the medial left base. The lungs elsewhere clear. Heart is mildly enlarged with pulmonary venous hypertension. No adenopathy. There is atherosclerotic calcification in the aorta. There is thoracolumbar levoscoliosis. There is degenerative change in the thoracic spine. IMPRESSION: Evidence a degree of congestive heart failure. Suspect atelectasis in the medial left base common early pneumonia in this area superimposed cannot be excluded. There is aortic atherosclerosis. Electronically Signed   By: Bretta Bang III M.D.   On: 09/06/2016 07:40    ASSESSMENT AND PLAN:  1. Persistent atrial fibrillation/flutter Now back in sinus rhythm and feeling better. Continue Tikosyn twice daily Will need to follow-up in AF clinic this week for BMET, Mg, and EKG Continue anticoagulation without interruption  2. Acute on chronic diastolic heart failure Now euvolemic Continue current medicine regimen on discharge Will follow-up with Vin 10/01/16  3. OSA Compliance with CPAP encouraged Will  be important for maintenance of SR  4. Obesity Body mass index is 40.29 kg/m. Lifestyle modification is necessary long term to maintain sinus rhythm  DC to home today Follow-up in AF clinic in 3-5 days Follow-up with Vin as scheduled 10/01/16   Hillis Range MD, [facility] 09/15/2016 12:45 PM

## 2016-09-15 NOTE — Progress Notes (Signed)
Blood hemolyzed per lab. BMP and Mg reordered.  Kailash Hinze, RN

## 2016-09-15 NOTE — Progress Notes (Signed)
Order for continuous Lactate ringer still active. Paged MD on call, and asked for this to be discontinued. Doctor Tiburcio Pea gave verbal order for Lactated ringer to be discontinued. Will continue to monitor.  Shekita Boyden, RN

## 2016-09-15 NOTE — Progress Notes (Signed)
Patient sleep during the night with complaints of occasional neck pain and headache.PRN given and effective. No other issues or concerns during the night shift from 7pm- 7am.  Will continue to monitor.   Henslee Lottman, RN

## 2016-09-15 NOTE — Discharge Summary (Signed)
Discharge Summary    Patient ID: [name],  MRN: [id number], DOB/AGE: December 21, 1950 66 y.o.  Admit date: 09/05/2016 Discharge date: 09/15/2016  Primary Care Provider: [name] Primary Cardiologist: Dr. [name]  Discharge Diagnoses    Principal Problem:   Acute on chronic diastolic CHF (congestive heart failure) [facility]) Active Problems:   Essential hypertension   Morbid obesity due to excess calories (HCC)   Atrial fibrillation with rapid ventricular response (HCC)   Hypercholesterolemia   Anticoagulated   Persistent atrial fibrillation (HCC)   Allergies Allergies  Allergen Reactions  . Levaquin [Levofloxacin In D5w] Rash  . Percocet [Oxycodone-Acetaminophen] Itching    Diagnostic Studies/Procedures    Echo: 09/06/16  Study Conclusions  - Left ventricle: Abnormal septal motion There was moderate   concentric hypertrophy. Systolic function was normal. The   estimated ejection fraction was in the range of 50% to 55%. Left   ventricular diastolic function parameters were normal. - Left atrium: The atrium was moderately dilated. - Atrial septum: No defect or patent foramen ovale was identified. - Pulmonary arteries: PA peak pressure: 41 mm Hg (S).  DCCV: 09/14/16 _____________   History of Present Illness     Toni A Williamsis a 66 y.o.femalewith a history of tobacco abuse. She quit smoking several years ago. Per records, she has no history of ischemic heart disease. She had a 2D echo 01/2016 that showed normal LVEF of 60-65% with grade 1DD. She carries a diagnosis of emphysema although most recent pulmonary function test mention restrictive disease. She was recentlyseeing her pulmonologist for dyspnea on exertion. An irregular heartbeat was deteceted. ECG was done showing atrial fibrillation in11/17. She was admittted with CHF as well at that time. She had a TEE/CV. She went back to AFib that same night. She has since been treated with rate control.  She is on Cardizem. She is also on chronic anticoagulation with Eliquis.   She was seen by Dr. [name] on 08/16/16 and was still in atrial fibrillation. He increased her Cardizem dose to 240 mg. He also increased her lasix to 40 mg BID given she was volume overloaded. She presents back to clinic for f/u. Dr. [name] recommended initiation of an antiarrythmic (possible flecainide), once her fluid status improved. He discussed with EP and it was determined that initiation with an AAD would be beneficial with a repeat cardioversion if still in atrial fibrillation.  In the office on 09/05/16 her EKG showed continued atrial fibrillation. HR is 84 bpm. BP is 127/90. She remained massively volume overloaded and noteed increased dyspnea. Her speech was pressured when she talks. Her dyspnea was worse with exertion. She also noted orthopnea and PND. No chest pain. She reported full medication compliance and avoidance of salt. Despite increasing her lasix to 40 mg BID, she reported reduced urinary output over the last several days. She also noted a productive cough with clear colored sputum. Of note, patient reported that she was recently diagnosed with severe OSA. She is waiting a CPAP. This is being followed by her PCP.    Hospital Course     Consultants: EP  She was sent over from the office for admission with plans for IV diuresis. Metolazone was added for addition diuresis. She was continued on IV lasix until 09/12/16. She diuresed a total of 12L this admission, and weight trended from 215>>206lbs. She remained rate controlled on oral diltiazem. EP was asked to consult on 09/09/16 for possible antiarrhythmic. Seen by Dr. [name] who recommended  the addition of dofetilide once improvement in CHF symptoms. Electrolytes were stable with diuresis. She was started on tikosyn BID on 09/12/16, QTc was stable. Then developed atrial flutter on the evening of 09/13/16, remained on Eliquis for anticoagulation without  interruption. Converted back into AF. Underwent DCCV on 09/14/16 but remained in afib, but rate controlled.   She was seen by Dr. [name] on 09/15/16 and determined stable for discharge home. She will remain on tikosyn with close follow up in the afib clinic. Home medications include Eliquis 5mg  BID, Diltiazem 240mg  daily, tikosyn BID, Lasix 40mg  BID, metolazone 2.9mh on Monday and Thursday, and potassium 40mg  BID. She was noted to be in SR on the day of discharge and felt well. I have sent a message to arrange for follow up in the afib clinic and she will keep her follow up appt in the office.  _____________  Discharge Vitals Blood pressure 121/83, pulse 71, temperature 97.4 F (36.3 C), temperature source Oral, resp. rate 18, height 5' (1.524 m), weight 206 lb 4.8 oz (93.6 kg), SpO2 91 %.  Filed Weights   09/13/16 0314 09/14/16 0530 09/15/16 0408  Weight: 205 lb 3.2 oz (93.1 kg) 203 lb 11.2 oz (92.4 kg) 206 lb 4.8 oz (93.6 kg)    Labs & Radiologic Studies    CBC No results for input(s): WBC, NEUTROABS, HGB, HCT, MCV, PLT in the last 72 hours. Basic Metabolic Panel  Recent Labs  09/14/16 0652 09/15/16 0718  NA 137 135  K 4.6 4.5  CL 88* 91*  CO2 35* 35*  GLUCOSE 120* 101*  BUN 39* 32*  CREATININE 1.26* 1.19*  CALCIUM 9.9 9.3  MG 2.2 1.8   Liver Function Tests No results for input(s): AST, ALT, ALKPHOS, BILITOT, PROT, ALBUMIN in the last 72 hours. No results for input(s): LIPASE, AMYLASE in the last 72 hours. Cardiac Enzymes No results for input(s): CKTOTAL, CKMB, CKMBINDEX, TROPONINI in the last 72 hours. BNP Invalid input(s): POCBNP D-Dimer No results for input(s): DDIMER in the last 72 hours. Hemoglobin A1C No results for input(s): HGBA1C in the last 72 hours. Fasting Lipid Panel No results for input(s): CHOL, HDL, LDLCALC, TRIG, CHOLHDL, LDLDIRECT in the last 72 hours. Thyroid Function Tests No results for input(s): TSH, T4TOTAL, T3FREE, THYROIDAB in the last 72  hours.  Invalid input(s): FREET3 _____________  Dg Chest 2 View  Result Date: 09/06/2016 CLINICAL DATA:  Shortness of breath.  Hypertension. EXAM: CHEST  2 VIEW COMPARISON:  July 26, 2016 FINDINGS: There is interstitial edema. There is mild patchy alveolar opacity in the medial left base. The lungs elsewhere clear. Heart is mildly enlarged with pulmonary venous hypertension. No adenopathy. There is atherosclerotic calcification in the aorta. There is thoracolumbar levoscoliosis. There is degenerative change in the thoracic spine. IMPRESSION: Evidence a degree of congestive heart failure. Suspect atelectasis in the medial left base common early pneumonia in this area superimposed cannot be excluded. There is aortic atherosclerosis. Electronically Signed   By: Bretta Bang III M.D.   On: 09/06/2016 07:40   Disposition   Pt is being discharged home today in good condition.  Follow-up Plans & Appointments    Follow-up Information    CARROLL,DONNA, NP Follow up.   Specialties:  Nurse Practitioner, Cardiology Why:  The office will call you an appt  Contact information: [address] [place] [address] [phone]        Bhagat,Bhavinkumar, PA Follow up on 10/01/2016.   Specialty:  Cardiology  Contact information: [address] [place] [address] [phone]          Discharge Instructions    (HEART FAILURE PATIENTS) Call MD:  Anytime you have any of the following symptoms: 1) 3 pound weight gain in 24 hours or 5 pounds in 1 week 2) shortness of breath, with or without a dry hacking cough 3) swelling in the hands, feet or stomach 4) if you have to sleep on extra pillows at night in order to breathe.    Complete by:  As directed    Diet - low sodium heart healthy    Complete by:  As directed    Increase activity slowly    Complete by:  As directed       Discharge Medications   Current Discharge Medication List    START taking these medications    Details  dofetilide (TIKOSYN) 500 MCG capsule Take 1 capsule (500 mcg total) by mouth 2 (two) times daily. Qty: 60 capsule, Refills: 3    metolazone (ZAROXOLYN) 2.5 MG tablet Take 1 tablet (2.5 mg total) by mouth 2 (two) times a week. Take one tablet Monday and Thurday Qty: 30 tablet, Refills: 2      CONTINUE these medications which have CHANGED   Details  acetaminophen (TYLENOL) 500 MG tablet Take 1 tablet (500 mg total) by mouth every 6 (six) hours as needed. Qty: 30 tablet, Refills: 0    furosemide (LASIX) 40 MG tablet Take 1 tablet (40 mg total) by mouth 2 (two) times daily. Qty: 90 tablet, Refills: 3    potassium chloride SA (K-DUR,KLOR-CON) 20 MEQ tablet Take 2 tablets (40 mEq total) by mouth 2 (two) times daily. Qty: 90 tablet, Refills: 3      CONTINUE these medications which have NOT CHANGED   Details  apixaban (ELIQUIS) 5 MG TABS tablet Take 1 tablet (5 mg total) by mouth 2 (two) times daily. Qty: 60 tablet, Refills: 0    atorvastatin (LIPITOR) 10 MG tablet Take 1 tablet (10 mg total) by mouth daily at 6 PM. Qty: 30 tablet, Refills: 0    diltiazem (CARDIZEM CD) 240 MG 24 hr capsule Take 1 capsule (240 mg total) by mouth daily. Qty: 90 capsule, Refills: 3    HYDROcodone-acetaminophen (NORCO/VICODIN) 5-325 MG tablet Take 1 tablet by mouth every 6 (six) hours as needed for moderate pain.    latanoprost (XALATAN) 0.005 % ophthalmic solution Place 1 drop into both eyes at bedtime.    LORazepam (ATIVAN) 1 MG tablet TAKE 1 TABLET BY MOUTH TID PRN ANXIETY Refills: 1    Multiple Vitamin (MULTIVITAMIN) capsule Take 1 capsule by mouth daily.    Oxymetazoline HCl (SINEX ULTRA FINE MIST 12-HOUR NA) Place 1 spray into the nose daily as needed (congestion).    PROAIR HFA 108 (90 Base) MCG/ACT inhaler INHALE 2 PUFFS INTO THE LUNGS Q 4 HOURS PRN Refills: 0    timolol (TIMOPTIC) 0.5 % ophthalmic solution INSTILL 1 DROP IN BOTH EYES BID Refills: 3          Outstanding  Labs/Studies   BMET, Mag and EKG at follow up in the AF clinic.   Duration of Discharge Encounter   Greater than 30 minutes including physician time.  Signed, Laverda Page NP-C 09/15/2016, 4:16 PM   Hillis Range MD, [facility] 09/15/2016 4:20 PM

## 2016-09-15 NOTE — Discharge Summary (Signed)
Pt got discharged, discharge instructions provided and patient showed understanding to it, IV taken out,Telemonitor DC,pt left unit in wheelchair with all of the belongings accompanied with her Son

## 2016-09-16 ENCOUNTER — Telehealth (HOSPITAL_COMMUNITY): Payer: Self-pay | Admitting: *Deleted

## 2016-09-16 NOTE — Telephone Encounter (Signed)
Patient called stating she cannot afford her tikosyn. She went to pick it up and it would be $190 for a month supply due to her deductible and cannot afford this. She has missed her tikosyn dose this morning unfortunately due to not receiving a 7 day supply prior to being discharged yesterday. Instructed pt to stop by our office for week supply of tikosyn and we will fill out patient assistance forms and do a tier exception for her copay. Pt was in agreement and will pick up tikosyn to restart today so she does not miss any more doses.

## 2016-09-17 ENCOUNTER — Encounter (HOSPITAL_COMMUNITY): Payer: Self-pay | Admitting: Internal Medicine

## 2016-09-17 ENCOUNTER — Telehealth (HOSPITAL_COMMUNITY): Payer: Self-pay | Admitting: *Deleted

## 2016-09-17 NOTE — Telephone Encounter (Signed)
Tier exception for tikosyn approved - reduced to tier 1 copay through 08/25/17. [id number].

## 2016-09-19 ENCOUNTER — Encounter (HOSPITAL_COMMUNITY): Payer: Self-pay | Admitting: Nurse Practitioner

## 2016-09-19 ENCOUNTER — Ambulatory Visit (HOSPITAL_COMMUNITY)
Admission: RE | Admit: 2016-09-19 | Discharge: 2016-09-19 | Disposition: A | Discharge status: Home / Self Care | Payer: Medicare Other | Source: Ambulatory Visit | Attending: Nurse Practitioner | Admitting: Nurse Practitioner

## 2016-09-19 VITALS — BP 126/82 | HR 70 | Ht 60.0 in | Wt 207.2 lb

## 2016-09-19 DIAGNOSIS — Z87891 Personal history of nicotine dependence: Secondary | ICD-10-CM | POA: Insufficient documentation

## 2016-09-19 DIAGNOSIS — I481 Persistent atrial fibrillation: Secondary | ICD-10-CM | POA: Insufficient documentation

## 2016-09-19 DIAGNOSIS — M1990 Unspecified osteoarthritis, unspecified site: Secondary | ICD-10-CM | POA: Diagnosis not present

## 2016-09-19 DIAGNOSIS — I110 Hypertensive heart disease with heart failure: Secondary | ICD-10-CM | POA: Diagnosis not present

## 2016-09-19 DIAGNOSIS — Z7901 Long term (current) use of anticoagulants: Secondary | ICD-10-CM | POA: Diagnosis not present

## 2016-09-19 DIAGNOSIS — I4891 Unspecified atrial fibrillation: Secondary | ICD-10-CM

## 2016-09-19 DIAGNOSIS — E7800 Pure hypercholesterolemia, unspecified: Secondary | ICD-10-CM | POA: Insufficient documentation

## 2016-09-19 DIAGNOSIS — J439 Emphysema, unspecified: Secondary | ICD-10-CM | POA: Diagnosis not present

## 2016-09-19 DIAGNOSIS — G4733 Obstructive sleep apnea (adult) (pediatric): Secondary | ICD-10-CM | POA: Insufficient documentation

## 2016-09-19 DIAGNOSIS — I509 Heart failure, unspecified: Secondary | ICD-10-CM | POA: Diagnosis not present

## 2016-09-20 NOTE — Progress Notes (Signed)
Primary Care Physician: [name], MD Referring Physician: [name] f/u Cardiologist: Dr, [name] is a 66 y.o. female that is being seen in the afib clinic for f/u hospitalization for heart failure and persistent afib with rvr.She was seen by Dr. [name] on 08/16/16 and was still in atrial fibrillation. He increased her Cardizem dose to 240 mg. He also increased her lasix to 40 mg BID given she was volume overloaded. She presented back to clinic for f/u. [name] recommended initiation of an antiarrythmic (possible flecainide), once her fluid status improved. He discussed with EP and it was determined that initiation with an AAD would be beneficial with a repeat cardioversion if still in atrial fibrillation.  In the office on 09/05/16 her EKG showed continued atrial fibrillation. HR was 84 bpm. BP was 127/90. She remained massively volume overloaded and noted increased dyspnea. Her speech was pressured when she talked. Her dyspnea was worse with exertion. She also noted orthopnea and PND. No chest pain. She reported full medication compliance and avoidance of salt. Despite increasing her lasix to 40 mg BID, she reported reduced urinary output over the last several days. She also noted a productive cough with clear colored sputum. Of note, patient reported that she was recently diagnosed with severe OSA. She is waiting a CPAP. This is being followed by her PCP.   She was sent over from the office for admission with plans for IV diuresis. Metolazone was added for addition diuresis. She was continued on IV lasix until 09/12/16. She diuresed a total of 12L this admission, and weight trended from 215>>206lbs. She remained rate controlled on oral diltiazem. EP was asked to consult on 09/09/16 for possible antiarrhythmic. Seen by Dr. [name] who recommended the addition of dofetilide once improvement in CHF symptoms. Electrolytes were stable with diuresis. She was started on tikosyn  BID on 09/12/16, QTc was stable. Then developed atrial flutter on the evening of 09/13/16, remained on Eliquis for anticoagulation without interruption. Converted back into AF. Underwent DCCV on 09/14/16 but remained in afib, but rate controlled.   She was seen by Dr. [name] on 09/15/16 and determined stable for discharge home. She will remain on tikosyn with close f/u.Marland Kitchen Home medications include Eliquis 5mg  BID, Diltiazem 240mg  daily, tikosyn BID, Lasix 40mg  BID, metolazone 2.54mh on Monday and Thursday, and potassium 40mg  BID. She was noted to be in SR on the day of discharge and felt well.   In the afib clinic she continues to feel improved. Fluid status is stable. She remains in SR. She did not get her tikosyn  from [facility] at discharge but has now. Tier exception was arranged for the pt and she will only pay $2 vrs $190 that she was first told. There is some concern re electrolyte imbalance and Tikosyn use. I discussed with Dr. [name] and he feels that if fluid status is stable and stays stable, she may be able to omit at this time.  Today, she denies symptoms of palpitations, chest pain, shortness of breath, orthopnea, PND,  dizziness, presyncope, syncope, or neurologic sequela.  Chronic LLE. The patient is tolerating medications without difficulties and is otherwise without complaint today.   Past Medical History:  Diagnosis Date  . Emphysema of lung (HCC)   . Hypercholesterolemia   . Hypertension   . Osteoarthritis    Past Surgical History:  Procedure Laterality Date  . CARDIOVERSION N/A 07/25/2016   Procedure: CARDIOVERSION;  Surgeon: Pricilla Riffle, MD;  Location: [facility] ENDOSCOPY;  Service: Cardiovascular;  Laterality: N/A;  . CARDIOVERSION N/A 09/14/2016   Procedure: CARDIOVERSION;  Surgeon: Hillis Range, MD;  Location: [facility] OR;  Service: Cardiovascular;  Laterality: N/A;  . CATARACT EXTRACTION Bilateral   . LEG SURGERY Right   . TEE WITHOUT CARDIOVERSION N/A 07/25/2016   Procedure:  TRANSESOPHAGEAL ECHOCARDIOGRAM (TEE);  Surgeon: Pricilla Riffle, MD;  Location: [facility] ENDOSCOPY;  Service: Cardiovascular;  Laterality: N/A;    Current Outpatient Prescriptions  Medication Sig Dispense Refill  . acetaminophen (TYLENOL) 500 MG tablet Take 1 tablet (500 mg total) by mouth every 6 (six) hours as needed. 30 tablet 0  . apixaban (ELIQUIS) 5 MG TABS tablet Take 1 tablet (5 mg total) by mouth 2 (two) times daily. 60 tablet 0  . atorvastatin (LIPITOR) 10 MG tablet Take 1 tablet (10 mg total) by mouth daily at 6 PM. 30 tablet 0  . diltiazem (CARDIZEM CD) 240 MG 24 hr capsule Take 1 capsule (240 mg total) by mouth daily. 90 capsule 3  . dofetilide (TIKOSYN) 500 MCG capsule Take 1 capsule (500 mcg total) by mouth 2 (two) times daily. 60 capsule 3  . furosemide (LASIX) 40 MG tablet Take 1 tablet (40 mg total) by mouth 2 (two) times daily. 90 tablet 3  . HYDROcodone-acetaminophen (NORCO/VICODIN) 5-325 MG tablet Take 1 tablet by mouth every 6 (six) hours as needed for moderate pain.    Marland Kitchen latanoprost (XALATAN) 0.005 % ophthalmic solution Place 1 drop into both eyes at bedtime.    Marland Kitchen LORazepam (ATIVAN) 1 MG tablet TAKE 1 TABLET BY MOUTH TID PRN ANXIETY  1  . Multiple Vitamin (MULTIVITAMIN) capsule Take 1 capsule by mouth daily.    . Oxymetazoline HCl (SINEX ULTRA FINE MIST 12-HOUR NA) Place 1 spray into the nose daily as needed (congestion).    . potassium chloride SA (K-DUR,KLOR-CON) 20 MEQ tablet TAKE 2 TABLETS(40 MEQ) BY MOUTH TWICE DAILY 360 tablet 3  . PROAIR HFA 108 (90 Base) MCG/ACT inhaler INHALE 2 PUFFS INTO THE LUNGS Q 4 HOURS PRN  0  . timolol (TIMOPTIC) 0.5 % ophthalmic solution INSTILL 1 DROP IN BOTH EYES BID  3  . metolazone (ZAROXOLYN) 2.5 MG tablet Take 1 tablet (2.5 mg total) by mouth 2 (two) times a week. Take one tablet Monday and Thurday (Patient not taking: Reported on 09/19/2016) 30 tablet 2   No current facility-administered medications for this encounter.     Allergies    Allergen Reactions  . Levaquin [Levofloxacin In D5w] Rash  . Percocet [Oxycodone-Acetaminophen] Itching    Social History   Social History  . Marital status: Widowed    Spouse name: N/A  . Number of children: N/A  . Years of education: N/A   Occupational History  . Not on file.   Social History Main Topics  . Smoking status: Former Smoker    Packs/day: 1.00    Years: 43.00    Types: Cigarettes    Start date: 08/26/1966    Quit date: 08/26/2010  . Smokeless tobacco: Never Used  . Alcohol use No  . Drug use: No  . Sexual activity: Not on file   Other Topics Concern  . Not on file   Social History Narrative  . No narrative on file    Family History  Problem Relation Age of Onset  . Stroke Neg Hx     ROS- All systems are reviewed and negative except as per the HPI above  Physical Exam: Vitals:  09/19/16 1141  BP: 126/82  Pulse: 70  Weight: 207 lb 3.2 oz (94 kg)  Height: 5' (1.524 m)   Wt Readings from Last 3 Encounters:  09/19/16 207 lb 3.2 oz (94 kg)  09/15/16 206 lb 4.8 oz (93.6 kg)  09/05/16 217 lb (98.4 kg)    Labs: Lab Results  Component Value Date   NA 135 09/15/2016   K 4.5 09/15/2016   CL 91 (L) 09/15/2016   CO2 35 (H) 09/15/2016   GLUCOSE 101 (H) 09/15/2016   BUN 32 (H) 09/15/2016   CREATININE 1.19 (H) 09/15/2016   CALCIUM 9.3 09/15/2016   MG 1.8 09/15/2016   Lab Results  Component Value Date   INR 1.28 09/13/2016   Lab Results  Component Value Date   CHOL 205 (H) 07/24/2016   HDL 64 07/24/2016   LDLCALC 131 (H) 07/24/2016   TRIG 50 07/24/2016     GEN- The patient is well appearing, alert and oriented x 3 today.   Head- normocephalic, atraumatic Eyes-  Sclera clear, conjunctiva pink Ears- hearing intact Oropharynx- clear Neck- supple, no JVP Lymph- no cervical lymphadenopathy Lungs- Clear to ausculation bilaterally, normal work of breathing Heart- Regular rate and rhythm, no murmurs, rubs or gallops, PMI not laterally  displaced GI- soft, NT, ND, + BS Extremities- no clubbing, cyanosis, or 1t pitting edema( improved) MS- no significant deformity or atrophy Skin- no rash or lesion Psych- euthymic mood, full affect Neuro- strength and sensation are intact  EKG- NSR at 70 bpm, pr int 156 ms, qrs int 74 ms, qtc 434 ms (stable) Epic records reviewed   Assessment and Plan: 1. Persistent symptomatic afib Is maintaining SR on Tikosyn Tikosyn precautions discussed Continue apixaban for CHA2DS2VASc score of at least 4.  2. Acute on chronic diastolic heart failure Continue lasix  Pt states that she has diuresed 12 lbs and weight is stable She is avoiding salt Hold metolazone for now for possible electrolyte imbalance with tikosyn But if weight increases with increase in LLE or shortness of breath will have to resume at 2x a week with close f/u of bmet/mag   F/u in one month in afib clinc Will see B. Bhavinkumar, PA 2/6

## 2016-09-26 ENCOUNTER — Telehealth (HOSPITAL_COMMUNITY): Payer: Self-pay | Admitting: *Deleted

## 2016-09-26 ENCOUNTER — Encounter: Payer: Self-pay | Admitting: Physician Assistant

## 2016-09-26 NOTE — Telephone Encounter (Signed)
Patient called in stating she has noticed in the morning her heart rates are running in the 70s but in the evenings before her tikosyn dose her HR is in the 90-118 range. She doesn't really notice it unless she is laying down then she sometimes can feel her heartbeat in her neck. Questioned pt when she taking tikosyn -- she is taking it at 9am and 4pm. Educated patient she should have 12 hours between doses -- to take morning dose at 9am and evening dose at 9pm. Discussed with [name] NP wants pt to monitor bp/hr log with change in tikosyn timing through end of week and call on Monday with report. Pt also reports her weight is remaining stable between 205-206lbs. Pt verbalized understanding.

## 2016-09-26 NOTE — Progress Notes (Deleted)
Cardiology Office Note    Date:  09/26/2016   ID:  [name], DOB [birth year], MRN [id number]  PCP:  [name], MD  Cardiologist:  Dr. [name]  Chief Complaint: Hospital follow up for CHF  History of Present Illness:   [name] is a 66 y.o. female HTN, HLD, persistent afib, prior smoker, chronic diastolic CHF and OA presents for follow up.   She had a 2D echo 01/2016 that showed normal LVEF of 60-65% with grade 1DD. She carries a diagnosis of emphysema although most recent pulmonary function test mention restrictive disease. She was recentlyseeing her pulmonologist for dyspnea on exertion. An irregular heartbeat was deteceted. ECG was done showing atrial fibrillation in11/17. She was admittted with CHF as well at that time. She had a TEE/CV. She went back to AFib that same night. She has since been treated with rate control. She is on Cardizem. She is also on chronic anticoagulation with Eliquis.   She was seen by Dr. [name] on 08/16/16 and was still in atrial fibrillation. He increased her Cardizem dose to 240 mg. He also increased her lasix to 40 mg BID given she was volume overloaded. She presents back to clinic for f/u. Dr. [name] recommended initiation of an antiarrythmic (possible flecainide), once her fluid status improved. He discussed with EP and it was determined that initiation with an AAD would be beneficial with a repeat cardioversion if still in atrial fibrillation.  In the office on 09/05/16 her EKG showed continued atrial fibrillation. HR is 84 bpm. BP is 127/90. She remained massively volume overloaded and noteed increased dyspnea. She admitted directly for diuresis. Metolazone was added for addition diuresis. She was continued on IV lasix until 09/12/16. She diuresed a total of 12L this admission, and weight trended from 215>>206lbs. She remained rate controlled on oral diltiazem. EP was asked to consult on 09/09/16 for possible antiarrhythmic. Seen by  Dr. [name] who recommended the addition of dofetilide once improvement in CHF symptoms. She was started on tikosyn BID on 09/12/16, QTc was stable. Then developed atrial flutter on the evening of 09/13/16, remained on Eliquis for anticoagulation without interruption. Converted back into AF. Underwent DCCV on 09/14/16 but remained in afib, but rate controlled.  She was discharge home on Tikosyn with close outpatient follow up. She was noted sinus rhythm at day of discharge 09/15/16. She remained on sinus rhythm when seen in afib clinic 09/19/16. Advised to hold metolazone to avoid possible electrolyte imbalance.  Here today for follow up.  BMEt and mag   Past Medical History:  Diagnosis Date  . Chronic diastolic CHF (congestive heart failure) (HCC)   . Emphysema of lung (HCC)   . Hypercholesterolemia   . Hypertension   . Osteoarthritis   . Persistent atrial fibrillation [facility])     Past Surgical History:  Procedure Laterality Date  . CARDIOVERSION N/A 07/25/2016   Procedure: CARDIOVERSION;  Surgeon: Pricilla Riffle, MD;  Location: [facility] ENDOSCOPY;  Service: Cardiovascular;  Laterality: N/A;  . CARDIOVERSION N/A 09/14/2016   Procedure: CARDIOVERSION;  Surgeon: Hillis Range, MD;  Location[facility] OR;  Service: Cardiovascular;  Laterality: N/A;  . CATARACT EXTRACTION Bilateral   . LEG SURGERY Right   . TEE WITHOUT CARDIOVERSION N/A 07/25/2016   Procedure: TRANSESOPHAGEAL ECHOCARDIOGRAM (TEE);  Surgeon: Pricilla Riffle, MD;  Location: [facility] ENDOSCOPY;  Service: Cardiovascular;  Laterality: N/A;    Current Medications: Prior to Admission medications   Medication Sig Start Date End Date Taking? Authorizing  Provider  acetaminophen (TYLENOL) 500 MG tablet Take 1 tablet (500 mg total) by mouth every 6 (six) hours as needed. 09/15/16   Arty Baumgartner, NP  apixaban (ELIQUIS) 5 MG TABS tablet Take 1 tablet (5 mg total) by mouth 2 (two) times daily. 07/27/16   Dron Jaynie Collins, MD  atorvastatin (LIPITOR) 10 MG  tablet Take 1 tablet (10 mg total) by mouth daily at 6 PM. 07/27/16   Dron Jaynie Collins, MD  diltiazem (CARDIZEM CD) 240 MG 24 hr capsule Take 1 capsule (240 mg total) by mouth daily. 08/16/16   Corky Crafts, MD  dofetilide (TIKOSYN) 500 MCG capsule Take 1 capsule (500 mcg total) by mouth 2 (two) times daily. 09/15/16   Arty Baumgartner, NP  furosemide (LASIX) 40 MG tablet Take 1 tablet (40 mg total) by mouth 2 (two) times daily. 09/15/16   Arty Baumgartner, NP  HYDROcodone-acetaminophen (NORCO/VICODIN) 5-325 MG tablet Take 1 tablet by mouth every 6 (six) hours as needed for moderate pain.    Historical Provider, MD  latanoprost (XALATAN) 0.005 % ophthalmic solution Place 1 drop into both eyes at bedtime.    Historical Provider, MD  LORazepam (ATIVAN) 1 MG tablet TAKE 1 TABLET BY MOUTH TID PRN ANXIETY 01/05/16   Historical Provider, MD  metolazone (ZAROXOLYN) 2.5 MG tablet Take 1 tablet (2.5 mg total) by mouth 2 (two) times a week. Take one tablet Monday and Thurday Patient not taking: Reported on 09/19/2016 09/16/16   Arty Baumgartner, NP  Multiple Vitamin (MULTIVITAMIN) capsule Take 1 capsule by mouth daily.    Historical Provider, MD  Oxymetazoline HCl (SINEX ULTRA FINE MIST 12-HOUR NA) Place 1 spray into the nose daily as needed (congestion).    Historical Provider, MD  potassium chloride SA (K-DUR,KLOR-CON) 20 MEQ tablet TAKE 2 TABLETS(40 MEQ) BY MOUTH TWICE DAILY 09/17/16   Arty Baumgartner, NP  PROAIR HFA 108 [phone] Base) MCG/ACT inhaler INHALE 2 PUFFS INTO THE LUNGS Q 4 HOURS PRN 01/05/16   Historical Provider, MD  timolol (TIMOPTIC) 0.5 % ophthalmic solution INSTILL 1 DROP IN BOTH EYES BID 11/28/15   Historical Provider, MD    Allergies:   Levaquin [levofloxacin in d5w] and Percocet [oxycodone-acetaminophen]   Social History   Social History  . Marital status: Widowed    Spouse name: N/A  . Number of children: N/A  . Years of education: N/A   Social History Main Topics  . Smoking  status: Former Smoker    Packs/day: 1.00    Years: 43.00    Types: Cigarettes    Start date: 08/26/1966    Quit date: 08/26/2010  . Smokeless tobacco: Never Used  . Alcohol use No  . Drug use: No  . Sexual activity: Not on file   Other Topics Concern  . Not on file   Social History Narrative  . No narrative on file     Family History:  The patient's family history is not on file. ***  ROS:   Please see the history of present illness.    ROS All other systems reviewed and are negative.   PHYSICAL EXAM:   VS:  There were no vitals taken for this visit.   GEN: Well nourished, well developed, in no acute distress  HEENT: normal  Neck: no JVD, carotid bruits, or masses Cardiac: ***RRR; no murmurs, rubs, or gallops,no edema  Respiratory:  clear to auscultation bilaterally, normal work of breathing GI: soft, nontender, nondistended, + BS MS: no  deformity or atrophy  Skin: warm and dry, no rash Neuro:  Alert and Oriented x 3, Strength and sensation are intact Psych: euthymic mood, full affect  Wt Readings from Last 3 Encounters:  09/19/16 207 lb 3.2 oz (94 kg)  09/15/16 206 lb 4.8 oz (93.6 kg)  09/05/16 217 lb (98.4 kg)      Studies/Labs Reviewed:   EKG:  EKG is ordered today.  The ekg ordered today demonstrates ***  Recent Labs: 06/25/2016: Pro B Natriuretic peptide (BNP) 141.0 07/23/2016: ALT 19 07/27/2016: Hemoglobin 15.6; Platelets 166 09/05/2016: B Natriuretic Peptide 94.5; TSH 2.070 09/15/2016: BUN 32; Creatinine, Ser 1.19; Magnesium 1.8; Potassium 4.5; Sodium 135   Lipid Panel    Component Value Date/Time   CHOL 205 (H) 07/24/2016 0044   TRIG 50 07/24/2016 0044   HDL 64 07/24/2016 0044   CHOLHDL 3.2 07/24/2016 0044   VLDL 10 07/24/2016 0044   LDLCALC 131 (H) 07/24/2016 0044    Additional studies/ records that were reviewed today include:   Echocardiogram: 09/06/16 LV EF: 50% -    55%  ------------------------------------------------------------------- History:   PMH:  CHF-Acute diastolic.  Risk factors:  Hypertension. Dyslipidemia.  ------------------------------------------------------------------- Study Conclusions  - Left ventricle: Abnormal septal motion There was moderate   concentric hypertrophy. Systolic function was normal. The   estimated ejection fraction was in the range of 50% to 55%. Left   ventricular diastolic function parameters were normal. - Left atrium: The atrium was moderately dilated. - Atrial septum: No defect or patent foramen ovale was identified. - Pulmonary arteries: PA peak pressure: 41 mm Hg (S).    ASSESSMENT & PLAN:    1. Persistent symptomatic afib - Rhythm today ***.   CHADSVASc score of 4. Continue Eliquis.  2. Chronic diastolic CHF - diuresed 12lb in hospital. Avoid salt. Euvolemic.   3. OSA: - Recently diagnosed, awaiting CPAP. Important to maintain sinus rhythm.    Medication Adjustments/Labs and Tests Ordered: Current medicines are reviewed at length with the patient today.  Concerns regarding medicines are outlined above.  Medication changes, Labs and Tests ordered today are listed in the Patient Instructions below. There are no Patient Instructions on file for this visit.   Lorelei Pont, [place]  09/26/2016 2:23 PM    [facility] [address] [place], [place], [place]  [address] Phone: [phone]; Fax: [phone]

## 2016-10-01 ENCOUNTER — Ambulatory Visit (INDEPENDENT_AMBULATORY_CARE_PROVIDER_SITE_OTHER): Payer: Medicare Other | Admitting: Physician Assistant

## 2016-10-01 ENCOUNTER — Encounter: Payer: Self-pay | Admitting: Physician Assistant

## 2016-10-01 ENCOUNTER — Encounter: Payer: Medicare Other | Admitting: Physician Assistant

## 2016-10-01 ENCOUNTER — Encounter (INDEPENDENT_AMBULATORY_CARE_PROVIDER_SITE_OTHER): Payer: Self-pay

## 2016-10-01 VITALS — BP 110/80 | HR 82 | Ht 60.0 in | Wt 207.8 lb

## 2016-10-01 DIAGNOSIS — I10 Essential (primary) hypertension: Secondary | ICD-10-CM

## 2016-10-01 DIAGNOSIS — I481 Persistent atrial fibrillation: Secondary | ICD-10-CM

## 2016-10-01 DIAGNOSIS — I5032 Chronic diastolic (congestive) heart failure: Secondary | ICD-10-CM

## 2016-10-01 DIAGNOSIS — G4733 Obstructive sleep apnea (adult) (pediatric): Secondary | ICD-10-CM | POA: Diagnosis not present

## 2016-10-01 DIAGNOSIS — I4819 Other persistent atrial fibrillation: Secondary | ICD-10-CM

## 2016-10-01 NOTE — Patient Instructions (Signed)
Medication Instructions:  Your physician recommends that you continue on your current medications as directed. Please refer to the Current Medication list given to you today.   Labwork: TODAY: Basic Metabolic Panel, Magnesium  Testing/Procedures: NONE  Follow-Up: Your physician recommends that you schedule a follow-up appointment in: 3 months with Dr. [name]   Any Other Special Instructions Will Be Listed Below (If Applicable).     If you need a refill on your cardiac medications before your next appointment, please call your pharmacy.

## 2016-10-01 NOTE — Progress Notes (Signed)
Cardiology Office Note    Date:  10/01/2016   ID:  MAUI AHART, DOB 1951-07-01, MRN [id number]  PCP:  [name], MD  Cardiologist:  Dr. [name] EP : Dr. [name]   Chief Complaint: Hospital follow up for CHF  History of Present Illness:   [name] is a 66 y.o. female HTN, HLD, persistent afib, prior smoker, chronic diastolic CHF and OA presents for follow up.   She had a 2D echo 01/2016 that showed normal LVEF of 60-65% with grade 1DD. She carries a diagnosis of emphysema although most recent pulmonary function test mention restrictive disease. She was recentlyseeing her pulmonologist for dyspnea on exertion. An irregular heartbeat was deteceted. ECG was done showing atrial fibrillation in11/17. She was admittted with CHF as well at that time. She had a TEE/CV. She went back to AFib that same night. She has since been treated with rate control. She is on Cardizem. She is also on chronic anticoagulation with Eliquis.   She was seen by Dr. [name] on 08/16/16 and was still in atrial fibrillation. He increased her Cardizem dose to 240 mg. He also increased her lasix to 40 mg BID given she was volume overloaded. She presents back to clinic for f/u. Dr. [name] recommended initiation of an antiarrythmic (possible flecainide), once her fluid status improved. He discussed with EP and it was determined that initiation with an AAD would be beneficial with a repeat cardioversion if still in atrial fibrillation.  In the office on 09/05/16 her EKG showedcontinued atrial fibrillation. HR is 84 bpm. BP is 127/90. She remained massively volume overloaded and noteedincreased dyspnea. She admitted directly for diuresis. Metolazone was added for addition diuresis. She was continued on IV lasix until 09/12/16. She diuresed a total of 12L this admission, and weight trended from 215>>206lbs. She remained rate controlled on oral diltiazem. EP was asked to consult on 09/09/16 for possible  antiarrhythmic. Seen by Dr. [name] who recommended the addition of dofetilide once improvement in CHF symptoms. She was started on tikosyn BID on 09/12/16, QTc was stable. Then developed atrial flutter on the evening of 09/13/16, remained on Eliquis for anticoagulation without interruption. Converted back into AF. Underwent DCCV on 09/14/16 but remained in afib, but rate controlled.  She was discharge home on Tikosyn with close outpatient follow up. She was noted sinus rhythm at day of discharge 09/15/16. She remained on sinus rhythm when seen in afib clinic 09/19/16. Advised to hold metolazone to avoid possible electrolyte imbalance.  Here today for follow up. Doing well. Weight has been stable at home to 206-207lb. Denies significant dyspnea, improving. Denies orthopnea, PND, LE edema, dizziness, chest pain, palpitation, syncope, melena or blood in her stool or urine. HR in 70-90s range. 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   Past Medical History:  Diagnosis Date  . Chronic diastolic CHF (congestive heart failure) (HCC)   . Emphysema of lung (HCC)   . Hypercholesterolemia   . Hypertension   . Osteoarthritis   . Persistent atrial fibrillation [facility])     Past Surgical History:  Procedure Laterality Date  . CARDIOVERSION N/A 07/25/2016   Procedure: CARDIOVERSION;  Surgeon: Pricilla Riffle, MD;  Location: [facility] ENDOSCOPY;  Service: Cardiovascular;  Laterality: N/A;  . CARDIOVERSION N/A 09/14/2016   Procedure: CARDIOVERSION;  Surgeon: Hillis Range, MD;  Location: [facility] OR;  Service: Cardiovascular;  Laterality: N/A;  . CATARACT EXTRACTION Bilateral   . LEG SURGERY Right   . TEE WITHOUT  CARDIOVERSION N/A 07/25/2016   Procedure: TRANSESOPHAGEAL ECHOCARDIOGRAM (TEE);  Surgeon: Pricilla Riffle, MD;  Location: [facility] ENDOSCOPY;  Service: Cardiovascular;  Laterality: N/A;    Current Medications: Prior to Admission medications   Medication Sig Start Date End Date Taking? Authorizing Provider    acetaminophen (TYLENOL) 500 MG tablet Take 1 tablet (500 mg total) by mouth every 6 (six) hours as needed. 09/15/16   Arty Baumgartner, NP  apixaban (ELIQUIS) 5 MG TABS tablet Take 1 tablet (5 mg total) by mouth 2 (two) times daily. 07/27/16   Dron Jaynie Collins, MD  atorvastatin (LIPITOR) 10 MG tablet Take 1 tablet (10 mg total) by mouth daily at 6 PM. 07/27/16   Dron Jaynie Collins, MD  diltiazem (CARDIZEM CD) 240 MG 24 hr capsule Take 1 capsule (240 mg total) by mouth daily. 08/16/16   Corky Crafts, MD  dofetilide (TIKOSYN) 500 MCG capsule Take 1 capsule (500 mcg total) by mouth 2 (two) times daily. 09/15/16   Arty Baumgartner, NP  furosemide (LASIX) 40 MG tablet Take 1 tablet (40 mg total) by mouth 2 (two) times daily. 09/15/16   Arty Baumgartner, NP  HYDROcodone-acetaminophen (NORCO/VICODIN) 5-325 MG tablet Take 1 tablet by mouth every 6 (six) hours as needed for moderate pain.    Historical Provider, MD  latanoprost (XALATAN) 0.005 % ophthalmic solution Place 1 drop into both eyes at bedtime.    Historical Provider, MD  LORazepam (ATIVAN) 1 MG tablet TAKE 1 TABLET BY MOUTH TID PRN ANXIETY 01/05/16   Historical Provider, MD  metolazone (ZAROXOLYN) 2.5 MG tablet Take 1 tablet (2.5 mg total) by mouth 2 (two) times a week. Take one tablet Monday and Thurday Patient not taking: Reported on 09/19/2016 09/16/16   Arty Baumgartner, NP  Multiple Vitamin (MULTIVITAMIN) capsule Take 1 capsule by mouth daily.    Historical Provider, MD  Oxymetazoline HCl (SINEX ULTRA FINE MIST 12-HOUR NA) Place 1 spray into the nose daily as needed (congestion).    Historical Provider, MD  potassium chloride SA (K-DUR,KLOR-CON) 20 MEQ tablet TAKE 2 TABLETS(40 MEQ) BY MOUTH TWICE DAILY 09/17/16   Arty Baumgartner, NP  PROAIR HFA 108 [phone] Base) MCG/ACT inhaler INHALE 2 PUFFS INTO THE LUNGS Q 4 HOURS PRN 01/05/16   Historical Provider, MD  timolol (TIMOPTIC) 0.5 % ophthalmic solution INSTILL 1 DROP IN BOTH EYES BID  11/28/15   Historical Provider, MD    Allergies:   Levaquin [levofloxacin in d5w] and Percocet [oxycodone-acetaminophen]   Social History   Social History  . Marital status: Widowed    Spouse name: N/A  . Number of children: N/A  . Years of education: N/A   Social History Main Topics  . Smoking status: Former Smoker    Packs/day: 1.00    Years: 43.00    Types: Cigarettes    Start date: 08/26/1966    Quit date: 08/26/2010  . Smokeless tobacco: Never Used  . Alcohol use No  . Drug use: No  . Sexual activity: Not Asked   Other Topics Concern  . None   Social History Narrative  . None     Family History:  The patient's family history is not on file.   ROS:   Please see the history of present illness.    ROS All other systems reviewed and are negative.   PHYSICAL EXAM:   VS:  BP 110/80   Pulse 82   Ht 5' (1.524 m)   Wt 207 lb 12.8  oz (94.3 kg)   SpO2 94%   BMI 40.58 kg/m    GEN: Well nourished, well developed, in no acute distress  HEENT: normal  Neck: no JVD, carotid bruits, or masses Cardiac: RRR; no murmurs, rubs, or gallops. Trace BL LE edema  Respiratory:  clear to auscultation bilaterally, normal work of breathing GI: soft, nontender, nondistended, + BS MS: no deformity or atrophy  Skin: warm and dry, no rash Neuro:  Alert and Oriented x 3, Strength and sensation are intact Psych: euthymic mood, full affect  Wt Readings from Last 3 Encounters:  10/01/16 207 lb 12.8 oz (94.3 kg)  09/19/16 207 lb 3.2 oz (94 kg)  09/15/16 206 lb 4.8 oz (93.6 kg)      Studies/Labs Reviewed:   EKG:  EKG is not ordered today.   Recent Labs: 06/25/2016: Pro B Natriuretic peptide (BNP) 141.0 07/23/2016: ALT 19 07/27/2016: Hemoglobin 15.6; Platelets 166 09/05/2016: B Natriuretic Peptide 94.5; TSH 2.070 09/15/2016: BUN 32; Creatinine, Ser 1.19; Magnesium 1.8; Potassium 4.5; Sodium 135   Lipid Panel    Component Value Date/Time   CHOL 205 (H) 07/24/2016 0044   TRIG 50  07/24/2016 0044   HDL 64 07/24/2016 0044   CHOLHDL 3.2 07/24/2016 0044   VLDL 10 07/24/2016 0044   LDLCALC 131 (H) 07/24/2016 0044    Additional studies/ records that were reviewed today include:   Echocardiogram: 09/06/16 LV EF: 50% - 55%  ------------------------------------------------------------------- History: PMH: CHF-Acute diastolic. Risk factors: Hypertension. Dyslipidemia.  ------------------------------------------------------------------- Study Conclusions  - Left ventricle: Abnormal septal motion There was moderate concentric hypertrophy. Systolic function was normal. The estimated ejection fraction was in the range of 50% to 55%. Left ventricular diastolic function parameters were normal. - Left atrium: The atrium was moderately dilated. - Atrial septum: No defect or patent foramen ovale was identified. - Pulmonary arteries: PA peak pressure: 41 mm Hg (S).    ASSESSMENT & PLAN:   1. PAF - Rhythm today on exam.   CHADSVASc score of 4. Continue Eliquis, Tikosyn and cardizem.   2. Chronic diastolic CHF - Doing well. Dyspnea has been improving. No orthopnea or PND. She hasn't started Metolazone yet. Weight has been stable.  Euvolemic except trace edema after on her feel all day today. Will hold to start metolazone. Also on Tikosyn. Get BEMT and Mg today. Discussed HF symptoms in detained.   3. OSA: - Recently diagnosed, awaiting CPAP. Important to maintain sinus rhythm.   4. HTN - Stable and well controlled,    Medication Adjustments/Labs and Tests Ordered: Current medicines are reviewed at length with the patient today.  Concerns regarding medicines are outlined above.  Medication changes, Labs and Tests ordered today are listed in the Patient Instructions below. Patient Instructions  Medication Instructions:  Your physician recommends that you continue on your current medications as directed. Please refer to the Current Medication list  given to you today.   Labwork: TODAY: Basic Metabolic Panel, Magnesium  Testing/Procedures: NONE  Follow-Up: Your physician recommends that you schedule a follow-up appointment in: 3 months with Dr. [name]   Any Other Special Instructions Will Be Listed Below (If Applicable).     If you need a refill on your cardiac medications before your next appointment, please call your pharmacy.     Lorelei Pont, [place]  10/01/2016 3:06 PM    [facility] [address] [place], [place], [place]  [address] Phone: [phone]; Fax: [phone]

## 2016-10-02 ENCOUNTER — Other Ambulatory Visit: Payer: Self-pay | Admitting: *Deleted

## 2016-10-02 ENCOUNTER — Telehealth: Payer: Self-pay | Admitting: *Deleted

## 2016-10-02 DIAGNOSIS — Z79899 Other long term (current) drug therapy: Secondary | ICD-10-CM

## 2016-10-02 DIAGNOSIS — E875 Hyperkalemia: Secondary | ICD-10-CM

## 2016-10-02 LAB — BASIC METABOLIC PANEL
BUN/Creatinine Ratio: 26 (ref 12–28)
BUN: 30 mg/dL — AB (ref 8–27)
CALCIUM: 9.9 mg/dL (ref 8.7–10.3)
CO2: 27 mmol/L (ref 18–29)
CREATININE: 1.14 mg/dL — AB (ref 0.57–1.00)
Chloride: 98 mmol/L (ref 96–106)
GFR calc Af Amer: 58 mL/min/{1.73_m2} — ABNORMAL LOW (ref >59)
GFR, EST NON AFRICAN AMERICAN: 50 mL/min/{1.73_m2} — AB (ref >59)
Glucose: 131 mg/dL — ABNORMAL HIGH (ref 65–99)
Potassium: 5.3 mmol/L — ABNORMAL HIGH (ref 3.5–5.2)
Sodium: 141 mmol/L (ref 134–144)

## 2016-10-02 LAB — MAGNESIUM: Magnesium: 2.5 mg/dL — ABNORMAL HIGH (ref 1.6–2.3)

## 2016-10-02 NOTE — Telephone Encounter (Signed)
PER  VIN   PT NEEDS  TO  HOLD  2 DOSES  OF  POTASSIUM  THEN RESUME  AS PREVIOUS    AND  RECHECK   BMET  AND  MAG   IN  1  WEEK . PT  AWARE  OF  CHANGES  AND  RECOMMENDATIONS/CY

## 2016-10-09 ENCOUNTER — Other Ambulatory Visit: Payer: Medicare Other | Admitting: *Deleted

## 2016-10-09 DIAGNOSIS — Z79899 Other long term (current) drug therapy: Secondary | ICD-10-CM

## 2016-10-09 DIAGNOSIS — E875 Hyperkalemia: Secondary | ICD-10-CM

## 2016-10-10 LAB — BASIC METABOLIC PANEL
BUN/Creatinine Ratio: 22 (ref 12–28)
BUN: 23 mg/dL (ref 8–27)
CO2: 30 mmol/L — ABNORMAL HIGH (ref 18–29)
Calcium: 9.5 mg/dL (ref 8.7–10.3)
Chloride: 96 mmol/L (ref 96–106)
Creatinine, Ser: 1.06 mg/dL — ABNORMAL HIGH (ref 0.57–1.00)
GFR calc Af Amer: 63 mL/min/{1.73_m2} (ref >59)
GFR calc non Af Amer: 55 mL/min/{1.73_m2} — ABNORMAL LOW (ref >59)
Glucose: 108 mg/dL — ABNORMAL HIGH (ref 65–99)
Potassium: 4.5 mmol/L (ref 3.5–5.2)
Sodium: 142 mmol/L (ref 134–144)

## 2016-10-10 LAB — MAGNESIUM: MAGNESIUM: 2.3 mg/dL (ref 1.6–2.3)

## 2016-10-21 ENCOUNTER — Ambulatory Visit (HOSPITAL_COMMUNITY)
Admission: RE | Admit: 2016-10-21 | Discharge: 2016-10-21 | Disposition: A | Discharge status: Home / Self Care | Payer: Medicare Other | Source: Ambulatory Visit | Attending: Nurse Practitioner | Admitting: Nurse Practitioner

## 2016-10-21 ENCOUNTER — Encounter (HOSPITAL_COMMUNITY): Payer: Self-pay | Admitting: Nurse Practitioner

## 2016-10-21 VITALS — BP 114/84 | HR 84 | Ht 60.0 in | Wt 208.0 lb

## 2016-10-21 DIAGNOSIS — Z7901 Long term (current) use of anticoagulants: Secondary | ICD-10-CM | POA: Diagnosis not present

## 2016-10-21 DIAGNOSIS — I481 Persistent atrial fibrillation: Secondary | ICD-10-CM

## 2016-10-21 DIAGNOSIS — E7800 Pure hypercholesterolemia, unspecified: Secondary | ICD-10-CM | POA: Insufficient documentation

## 2016-10-21 DIAGNOSIS — J439 Emphysema, unspecified: Secondary | ICD-10-CM | POA: Insufficient documentation

## 2016-10-21 DIAGNOSIS — M1990 Unspecified osteoarthritis, unspecified site: Secondary | ICD-10-CM | POA: Diagnosis not present

## 2016-10-21 DIAGNOSIS — I5033 Acute on chronic diastolic (congestive) heart failure: Secondary | ICD-10-CM | POA: Diagnosis not present

## 2016-10-21 DIAGNOSIS — Z87891 Personal history of nicotine dependence: Secondary | ICD-10-CM | POA: Diagnosis not present

## 2016-10-21 DIAGNOSIS — I110 Hypertensive heart disease with heart failure: Secondary | ICD-10-CM | POA: Diagnosis not present

## 2016-10-21 DIAGNOSIS — Z79899 Other long term (current) drug therapy: Secondary | ICD-10-CM | POA: Diagnosis not present

## 2016-10-21 DIAGNOSIS — I4819 Other persistent atrial fibrillation: Secondary | ICD-10-CM

## 2016-10-21 DIAGNOSIS — G4733 Obstructive sleep apnea (adult) (pediatric): Secondary | ICD-10-CM | POA: Diagnosis not present

## 2016-10-21 NOTE — Progress Notes (Signed)
Primary Care Physician: Shirline Frees, MD Referring Physician: [facility] f/u Cardiologist: Dr, [name] is a 66 y.o. female that is being seen in the afib clinic for f/u hospitalization for heart failure and persistent afib with rvr.She was seen by [name] on 08/16/16 and was still in atrial fibrillation. He increased her Cardizem dose to 240 mg. He also increased her lasix to 40 mg BID given she was volume overloaded. She presented back to clinic for f/u. [name] recommended initiation of an antiarrythmic (possible flecainide), once her fluid status improved. He discussed with EP and it was determined that initiation with an AAD would be beneficial with a repeat cardioversion if still in atrial fibrillation.  In the office on 09/05/16 her EKG showed continued atrial fibrillation. HR was 84 bpm. BP was 127/90. She remained massively volume overloaded and noted increased dyspnea. Her speech was pressured when she talked. Her dyspnea was worse with exertion. She also noted orthopnea and PND. No chest pain. She reported full medication compliance and avoidance of salt. Despite increasing her lasix to 40 mg BID, she reported reduced urinary output over the last several days. She also noted a productive cough with clear colored sputum. Of note, patient reported that she was recently diagnosed with severe OSA. She is waiting a CPAP. This is being followed by her PCP.   She was sent over from the office for admission with plans for IV diuresis. Metolazone was added for addition diuresis. She was continued on IV lasix until 09/12/16. She diuresed a total of 12L this admission, and weight trended from 215>>206lbs. She remained rate controlled on oral diltiazem. EP was asked to consult on 09/09/16 for possible antiarrhythmic. Seen by [name] who recommended the addition of dofetilide once improvement in CHF symptoms. Electrolytes were stable with diuresis. She was started on tikosyn  577mg BID on 09/12/16, QTc was stable. Then developed atrial flutter on the evening of 09/13/16, remained on Eliquis for anticoagulation without interruption. Converted back into AF. Underwent DCCV on 09/14/16 but remained in afib, but rate controlled.   She was seen by [name] 09/15/16 and determined stable for discharge home. She will remain on tikosyn with close f/u..Marland KitchenHome medications include Eliquis 535mBID, Diltiazem 24081maily, tikosyn 500m57mID, Lasix 40mg54m, metolazone 2.5mh o36monday and Thursday, and potassium 40mg B70mShe was noted to be in SR on the day of discharge and felt well.   In the afib clinic she continues to feel improved. Fluid status is stable. She remains in SR. [facility] Tier exception was arranged for the pt and she will only pay $2 vrs $190 that she was first told. There is some concern re electrolyte imbalance and Tikosyn use. I discussed with [name] that if fluid status is stable and stays stable, she may be able to omit at this time.  F/u in the afib clinic and she is staying in SR on tRaymondosynGermanys maintaining  her fluid weight. She is weighting daily. She states that her BP and HR are stable at home but she has noted that her pulse ox is running upper 80's/low 90's on room air and will dip into the 70's. I took for a short walk around the clinic and pulse ox to start  was 90 and desaturated to low 80's. She states that she had sleep study with  [facility] and is pending rescheduling with cpap titration. She was told that she stopped breathing a lot with low oxygen seen in the 60's.  Today, she denies symptoms of palpitations, chest pain,  orthopnea, PND,  dizziness, presyncope, syncope, or neurologic sequela.  Chronic LLE, shortness of breath . The patient is tolerating medications without difficulties and is otherwise without complaint today.   Past Medical History:  Diagnosis Date  .  Chronic diastolic CHF (congestive heart failure) ([place])   . Emphysema of lung ([place])   . Hypercholesterolemia   . Hypertension   . Osteoarthritis   . Persistent atrial fibrillation [facility])    Past Surgical History:  Procedure Laterality Date  . CARDIOVERSION N/A 07/25/2016   Procedure: CARDIOVERSION;  Surgeon: Fay Records, MD;  Location: [place];  Service: Cardiovascular;  Laterality: N/A;  . CARDIOVERSION N/A 09/14/2016   Procedure: CARDIOVERSION;  Surgeon: Thompson Grayer, MD;  Location: [place];  Service: Cardiovascular;  Laterality: N/A;  . CATARACT EXTRACTION Bilateral   . LEG SURGERY Right   . TEE WITHOUT CARDIOVERSION N/A 07/25/2016   Procedure: TRANSESOPHAGEAL ECHOCARDIOGRAM (TEE);  Surgeon: Fay Records, MD;  Location: [facility] ENDOSCOPY;  Service: Cardiovascular;  Laterality: N/A;    Current Outpatient Prescriptions  Medication Sig Dispense Refill  . acetaminophen (TYLENOL) 500 MG tablet Take 500 mg by mouth every 6 (six) hours as needed for moderate pain.    Marland Kitchen apixaban (ELIQUIS) 5 MG TABS tablet Take 1 tablet (5 mg total) by mouth 2 (two) times daily. 60 tablet 0  . atorvastatin (LIPITOR) 10 MG tablet Take 1 tablet (10 mg total) by mouth daily at 6 PM. 30 tablet 0  . diltiazem (CARDIZEM CD) 240 MG 24 hr capsule Take 1 capsule (240 mg total) by mouth daily. 90 capsule 3  . dofetilide (TIKOSYN) 500 MCG capsule Take 1 capsule (500 mcg total) by mouth 2 (two) times daily. 60 capsule 3  . furosemide (LASIX) 40 MG tablet Take 1 tablet (40 mg total) by mouth 2 (two) times daily. 90 tablet 3  . HYDROcodone-acetaminophen (NORCO/VICODIN) 5-325 MG tablet Take 1 tablet by mouth every 6 (six) hours as needed for moderate pain.    Marland Kitchen latanoprost (XALATAN) 0.005 % ophthalmic solution Place 1 drop into both eyes at bedtime.    Marland Kitchen LORazepam (ATIVAN) 1 MG tablet TAKE 1 TABLET BY MOUTH AS NEEDED FOR ANXIETY  1  . Multiple Vitamin (MULTIVITAMIN) capsule Take 1 capsule by mouth daily.    . Oxymetazoline HCl  (SINEX ULTRA FINE MIST 12-HOUR NA) Place 1 spray into the nose daily as needed (congestion).    . potassium chloride SA (K-DUR,KLOR-CON) 20 MEQ tablet TAKE 2 TABLETS(40 MEQ) BY MOUTH TWICE DAILY (Patient taking differently: take 2 tablets in the morning and 1 at night) 360 tablet 3  . PROAIR HFA 108 (90 Base) MCG/ACT inhaler INHALE 2 PUFFS INTO THE LUNGS EVERY 4 HOURS AS NEEDED FOR WHEEZING  0  . timolol (TIMOPTIC) 0.5 % ophthalmic solution INSTILL 1 DROP IN BOTH EYES TWICE DAILY  3   No current facility-administered medications for this encounter.     Allergies  Allergen Reactions  . Levaquin [Levofloxacin In D5w] Rash  . Percocet [Oxycodone-Acetaminophen] Itching    Social History   Social History  . Marital status: Widowed    Spouse name: N/A  . Number of children: N/A  . Years of education: N/A   Occupational History  . Not on file.   Social History Main  Topics  . Smoking status: Former Smoker    Packs/day: 1.00    Years: 43.00    Types: Cigarettes    Start date: 08/26/1966    Quit date: 08/26/2010  . Smokeless tobacco: Never Used  . Alcohol use No  . Drug use: No  . Sexual activity: Not on file   Other Topics Concern  . Not on file   Social History Narrative  . No narrative on file    Family History  Problem Relation Age of Onset  . Stroke Neg Hx     ROS- All systems are reviewed and negative except as per the HPI above  Physical Exam: Vitals:   10/21/16 1359  BP: 114/84  Pulse: 84  Weight: 208 lb (94.3 kg)  Height: 5' (1.524 m)   Wt Readings from Last 3 Encounters:  10/21/16 208 lb (94.3 kg)  10/01/16 207 lb 12.8 oz (94.3 kg)  09/19/16 207 lb 3.2 oz (94 kg)    Labs: Lab Results  Component Value Date   NA 142 10/09/2016   K 4.5 10/09/2016   CL 96 10/09/2016   CO2 30 (H) 10/09/2016   GLUCOSE 108 (H) 10/09/2016   BUN 23 10/09/2016   CREATININE 1.06 (H) 10/09/2016   CALCIUM 9.5 10/09/2016   MG 2.3 10/09/2016   Lab Results  Component Value  Date   INR 1.28 09/13/2016   Lab Results  Component Value Date   CHOL 205 (H) 07/24/2016   HDL 64 07/24/2016   LDLCALC 131 (H) 07/24/2016   TRIG 50 07/24/2016     GEN- The patient is well appearing, alert and oriented x 3 today.   Head- normocephalic, atraumatic Eyes-  Sclera clear, conjunctiva pink Ears- hearing intact Oropharynx- clear Neck- supple, no JVP Lymph- no cervical lymphadenopathy Lungs- Clear to ausculation bilaterally, normal work of breathing Heart- Regular rate and rhythm, no murmurs, rubs or gallops, PMI not laterally displaced GI- soft, NT, ND, + BS Extremities- no clubbing, cyanosis, or 1t pitting edema( improved) MS- no significant deformity or atrophy Skin- no rash or lesion Psych- euthymic mood, full affect Neuro- strength and sensation are intact  EKG- NSR at 84 bpm, pr int 125m, qrs int 74 ms, qtc 423 ms (stable) Epic records reviewed   Assessment and Plan: 1. Persistent symptomatic afib Is maintaining SR on Tikosyn Tikosyn precautions discussed Continue apixaban for CHA2DS2VASc score of at least 4. Bmet/mag both checked earlier in February  With maf of 2.5 and Kt 4.5, creat stable  2. Acute on chronic diastolic heart failure Continue lasix  Pt states that  weight is stable She is avoiding salt  3. Hypoxia Will request f/u with Dr. WShyrl Numberswith pt reporting  pulse ox at home often in the 80's and seen in the 60's with sleep study Cpap trial pending   F/u with Dr. VIrish Lack5/9 and afib clinic in 6 months for tikosyn surveillance   DOso Kaye Luoma, [facility] [place] [phone]

## 2016-10-29 ENCOUNTER — Other Ambulatory Visit (INDEPENDENT_AMBULATORY_CARE_PROVIDER_SITE_OTHER): Payer: Medicare Other

## 2016-10-29 ENCOUNTER — Encounter: Payer: Self-pay | Admitting: Internal Medicine

## 2016-10-29 ENCOUNTER — Ambulatory Visit (INDEPENDENT_AMBULATORY_CARE_PROVIDER_SITE_OTHER): Payer: Medicare Other | Admitting: Internal Medicine

## 2016-10-29 ENCOUNTER — Ambulatory Visit (INDEPENDENT_AMBULATORY_CARE_PROVIDER_SITE_OTHER)
Admission: RE | Admit: 2016-10-29 | Discharge: 2016-10-29 | Disposition: A | Discharge status: Home / Self Care | Payer: Medicare Other | Source: Ambulatory Visit | Attending: Internal Medicine | Admitting: Internal Medicine

## 2016-10-29 VITALS — BP 122/80 | HR 70 | Ht 60.0 in | Wt 208.4 lb

## 2016-10-29 DIAGNOSIS — R0600 Dyspnea, unspecified: Secondary | ICD-10-CM

## 2016-10-29 DIAGNOSIS — J9612 Chronic respiratory failure with hypercapnia: Secondary | ICD-10-CM

## 2016-10-29 DIAGNOSIS — J9611 Chronic respiratory failure with hypoxia: Secondary | ICD-10-CM

## 2016-10-29 DIAGNOSIS — R0609 Other forms of dyspnea: Secondary | ICD-10-CM | POA: Diagnosis not present

## 2016-10-29 DIAGNOSIS — J9601 Acute respiratory failure with hypoxia: Secondary | ICD-10-CM | POA: Diagnosis not present

## 2016-10-29 LAB — CBC WITH DIFFERENTIAL/PLATELET
BASOS ABS: 0.1 10*3/uL (ref 0.0–0.1)
Basophils Relative: 1.2 % (ref 0.0–3.0)
Eosinophils Absolute: 0.2 10*3/uL (ref 0.0–0.7)
Eosinophils Relative: 2.3 % (ref 0.0–5.0)
HCT: 49.3 % — ABNORMAL HIGH (ref 36.0–46.0)
HEMOGLOBIN: 15.9 g/dL — AB (ref 12.0–15.0)
LYMPHS ABS: 1.3 10*3/uL (ref 0.7–4.0)
LYMPHS PCT: 14.4 % (ref 12.0–46.0)
MCHC: 32.3 g/dL (ref 30.0–36.0)
MCV: 93.3 fl (ref 78.0–100.0)
MONOS PCT: 12.1 % — AB (ref 3.0–12.0)
Monocytes Absolute: 1.1 10*3/uL — ABNORMAL HIGH (ref 0.1–1.0)
Neutro Abs: 6.3 10*3/uL (ref 1.4–7.7)
Neutrophils Relative %: 70 % (ref 43.0–77.0)
Platelets: 193 10*3/uL (ref 150.0–400.0)
RBC: 5.28 Mil/uL — AB (ref 3.87–5.11)
RDW: 16.3 % — ABNORMAL HIGH (ref 11.5–15.5)
WBC: 9 10*3/uL (ref 4.0–10.5)

## 2016-10-29 LAB — BASIC METABOLIC PANEL
BUN: 26 mg/dL — ABNORMAL HIGH (ref 6–23)
CHLORIDE: 103 meq/L (ref 96–112)
CO2: 32 mEq/L (ref 19–32)
Calcium: 9.9 mg/dL (ref 8.4–10.5)
Creatinine, Ser: 1.01 mg/dL (ref 0.40–1.20)
GFR: 58.18 mL/min — AB (ref >60.00)
Glucose, Bld: 102 mg/dL — ABNORMAL HIGH (ref 70–99)
POTASSIUM: 4.7 meq/L (ref 3.5–5.1)
SODIUM: 140 meq/L (ref 135–145)

## 2016-10-29 LAB — SEDIMENTATION RATE: Sed Rate: 27 mm/hr (ref 0–30)

## 2016-10-29 LAB — TSH: TSH: 1.66 u[IU]/mL (ref 0.35–4.50)

## 2016-10-29 LAB — BRAIN NATRIURETIC PEPTIDE: PRO B NATRI PEPTIDE: 17 pg/mL (ref 0.0–100.0)

## 2016-10-29 NOTE — Progress Notes (Signed)
Subjective:     Patient ID: [name], female   DOB: 1951-03-17,    MRN: [id number]    Brief patient profile:  79 yowf quit smoking 2012 due to some cough/wheezing improved p quit at wt 180 and no obst on spirometry 06/25/16  referred to pulmonary clinic 06/25/2016 by Dr   [name] s/p admit:  Admit date: 02/05/2016 Discharge date: 02/08/2016  Discharge Diagnoses:  Principal Problem:   Acute respiratory failure with hypoxia (HCC) Active Problems:   HTN (hypertension)   PNA (pneumonia)   Glaucoma   Discharge Condition: Stable  Diet recommendation: Heart Healthy      Filed Weights   02/05/16 1723 02/06/16 0123  Weight: 93.895 kg (207 lb) 92.8 kg (204 lb 9.4 oz)    History of present illness:  [name] is a 66 y.o. female with medical history significant for hypertension, cholesterol high and glaucoma presents to the emergency room with chief complaint of respiratory distress. Patient states that she has been short of breath for the last month. She went to an urgent care with this complaint and was given antibiotics. She states the antibiotics did not help her in the long-term. She was briefly better and then got worse again. Patient went to go get seen today for further evaluation and was found to be hypoxic into the 80s. She was sent over to the emergency room.  [facility] Course:  [name] is a 66 y.o. Female with a PMH of HTN, HLD,Former smoker who presented to ED with 1 month history of shortness of breath and cough (failed outpatient antibiotic). She was found to have pneumonia on x-ray chest, and admitted to the [facility] for treatment of pneumonia and COPD with exacerbation.   Acute respiratory failure with hypoxia (HCC):  -Likely secondary to pneumonia/COPD above.  -She was treated with steroids, bronchodilators and empiric antibiotics.  -She significantly improved during this hospitalization -Discharged on Levaquin   COPD with  exacerbation:  -Long-standing history of tobacco abuse (more than 20 years)-she quit 5 years back.  -She was treated with IV steroids -Will discharge on Prednisone taper  Mildly decompensated diastolic heart failure:  -Echo performed on 02/06/2016 showing mild diastolic dysfunction -Discharge on Lasix 20 mg by mouth daily   History of Present Illness  06/25/2016 1st [place] [facility] office visit/ [name]   Chief Complaint  Patient presents with  . Consult visit    COPD consult per [name]- Pt states SOB w/ excertion. No congestion or tightness. Occasional coughing at times.   onset of sob spring 2017 x one month prior to admit on 02/05/16 gradually worse while on lisinopril  In April 2017 was using [name] parking but still able to do [name]  Now sob across the room  Sob occurs at rest if speaking  Able to lie down rotated on to left side otherwise chokes/ smothers rec Stop lotensin and start ibesartan 150 mg one daily  And your breathing should gradually improve  Please schedule a follow up office visit in 2 weeks, sooner if needed to see [name] to recheck your blood pressure  Add double the lasix dose for now    07/23/2016  f/u ov/[name] re: cough/ pseuowheeze ? Ace ? Cardiac asthma / stopped tenormin "ran out"  Chief Complaint  Patient presents with  . Follow-up    Breathing is unchanged since the last visit. She is coughing less. She is using proair 4 x daily on average.   used  one puff of proair so far today Sleeps on one fat pillow ok  rec Start bystolic 10 mg now   Please remember to go to the  x-ray department     10/29/2016 acute extended ov/[name] re: new onset resp failure/ pt maint on eliquis but no rsp rx  Chief Complaint  Patient presents with  . Acute Visit    Pt c/o increased SOB and lower o2 sats for the past wk.   gradually worse sob x one week assoc with low 02 sats though baseline doe = MMRC3 = can't walk 100 yards even at a slow pace at a flat grade s  stopping due to sob   No sign cough/ no chills no cp or vomiting  Leg swelling no worse than usual   No obvious day to day or daytime variability or assoc excess/ purulent sputum or mucus plugs or hemoptysis or cp or chest tightness, subjective wheeze or overt sinus or hb symptoms. No unusual exp hx or h/o childhood pna/ asthma or knowledge of premature birth.  Sleeping ok without nocturnal  or early am exacerbation  of respiratory  c/o's or need for noct saba. Also denies any obvious fluctuation of symptoms with weather or environmental changes or other aggravating or alleviating factors except as outlined above   Current Medications, Allergies, Complete Past Medical History, Past Surgical History, Family History, and Social History were reviewed in [name] record.  ROS  The following are not active complaints unless bolded sore throat, dysphagia, dental problems, itching, sneezing,  nasal congestion or excess/ purulent secretions, ear ache,   fever, chills, sweats, unintended wt loss, classically pleuritic or exertional cp,  orthopnea pnd or leg swelling, presyncope, palpitations, abdominal pain, anorexia, nausea, vomiting, diarrhea  or change in bowel or bladder habits, change in stools or urine, dysuria,hematuria,  rash, arthralgias, visual complaints, headache, numbness, weakness or ataxia or problems with walking or coordination,  change in mood/affect or memory.                  Objective:   Physical Exam    Obese wf nad  Very easily confused about details of care/ med names     07/23/2016     208   06/25/16 204 lb 1.6 oz (92.6 kg)  02/06/16 204 lb 9.4 oz (92.8 kg)  02/25/08 163 lb (73.9 kg)    Vital signs reviewed   - Note on arrival 02 sats  87% on RA      HEENT: nl dentition, turbinates, and oropharynx. Nl external ear canals without cough reflex   NECK :  without JVD/Nodes/TM/ nl carotid upstrokes bilaterally   LUNGS: no acc muscle use,   Nl contour chest with insp crackles R > L base    CV: IRIR apical rate around 125-130   no s3 or murmur or increase in P2,   pedal edema 1+ bilaterally sym   ABD:  Tensely obese soft and nontender with nl inspiratory excursion in the supine position. No bruits or organomegaly, bowel sounds nl  MS:  Nl gait/ ext warm without deformities, calf tenderness, cyanosis or clubbing No obvious joint restrictions   SKIN: warm and dry without lesions    NEURO:  alert, approp, nl sensorium with  no motor deficits      CXR PA and Lateral:   10/29/2016 :    I personally reviewed images and agree with radiology impression as follows:   COPD. Chronic bronchitic-fibrotic changes. Superimposed subsegmental  atelectasis or interstitial pneumonia at the left lung base. My impression:  This is not copd but restrictive change ? PF/ thoracic kyphosis         Labs ordered/ reviewed:      Chemistry      Component Value Date/Time   NA 140 10/29/2016 1205   NA 142 10/09/2016 1606   K 4.7 10/29/2016 1205   CL 103 10/29/2016 1205   CO2 32 10/29/2016 1205   BUN 26 (H) 10/29/2016 1205   BUN 23 10/09/2016 1606   CREATININE 1.01 10/29/2016 1205      Component Value Date/Time   CALCIUM 9.9 10/29/2016 1205   ALKPHOS 72 07/23/2016 1416   AST 25 07/23/2016 1416   ALT 19 07/23/2016 1416   BILITOT 0.7 07/23/2016 1416        Lab Results  Component Value Date   WBC 9.0 10/29/2016   HGB 15.9 (H) 10/29/2016   HCT 49.3 (H) 10/29/2016   MCV 93.3 10/29/2016   PLT 193.0 10/29/2016     Lab Results  Component Value Date   DDIMER 0.47 06/25/2016      Lab Results  Component Value Date   TSH 1.66 10/29/2016     Lab Results  Component Value Date   PROBNP 17.0 10/29/2016       Lab Results  Component Value Date   ESRSEDRATE 27 10/29/2016          Assessment:

## 2016-10-29 NOTE — Patient Instructions (Addendum)
Please see patient coordinator before you leave today  to schedule start 02 24/7 at 2lpm and ambulatory 02 titration to see if eligible for POC   Wear 02 2lpm 24/7 for now at 2lpm and increase to 4lpm with exertion  Use your incentive spirometer as much as you can   Please remember to go to the lab and x-ray department downstairs in the basement  for your tests - we will call you with the results when they are available.     I will arrange follow up for you here if indicated, otherwise just return to cardiology as planned  Late add:  Needs hrct next p taking one extra lasix daily x one week.

## 2016-10-30 ENCOUNTER — Encounter: Payer: Self-pay | Admitting: Internal Medicine

## 2016-10-30 ENCOUNTER — Telehealth: Payer: Self-pay | Admitting: *Deleted

## 2016-10-30 DIAGNOSIS — J9612 Chronic respiratory failure with hypercapnia: Secondary | ICD-10-CM

## 2016-10-30 DIAGNOSIS — R0609 Other forms of dyspnea: Principal | ICD-10-CM

## 2016-10-30 DIAGNOSIS — J9611 Chronic respiratory failure with hypoxia: Secondary | ICD-10-CM | POA: Insufficient documentation

## 2016-10-30 DIAGNOSIS — R0600 Dyspnea, unspecified: Secondary | ICD-10-CM

## 2016-10-30 NOTE — Telephone Encounter (Signed)
Spoke with pt and notified of results per Dr. Wert. Pt verbalized understanding and denied any questions. 

## 2016-10-30 NOTE — Assessment & Plan Note (Addendum)
Spirometry 06/25/2016  Purely restrictive with FVC  43% , no obst on f/v loop at all  Trial off acei 06/25/2016 > cough better 07/23/2016 but RAF > to ER   Echo 09/06/16 - Left ventricle: Abnormal septal motion There was moderate   concentric hypertrophy. Systolic function was normal. The   estimated ejection fraction was in the range of 50% to 55%. Left   ventricular diastolic function parameters were normal. - Left atrium: The atrium was moderately dilated. - Atrial septum: No defect or patent foramen ovale was identified. - Pulmonary arteries: PA peak pressure: 41 mm Hg (S).  - Spirometry 10/29/2016  uninterpretable f/v loop   - see chronic resp failure   Most of her chronic dyspnea is diastolic dysfunction (otherwise how to explain mod LAE/PH)  and obesity related but she now appears to have ILD > needs hrct next when she's as dry as we can safely get her so will add extra lasix x one week then do HRCT and go from there but this is clearly not COPD nor is it purely obesity related dypnea (the latter may explain the hypercarbia, however, esp combined with restrictive chest wall changes from kyphosis).   I had an extended discussion with the patient reviewing all relevant studies completed to date and  lasting 25 minutes of a 40  minute acute visit re progressively severe non-specific but potentially very serious refractory respiratory symptoms of unknown etiology.  Each maintenance medication was reviewed in detail including most importantly the difference between maintenance and prns and under what circumstances the prns are to be triggered using an action plan format that is not reflected in the computer generated alphabetically organized AVS.    Please see AVS for specific instructions unique to this office visit that I personally wrote and verbalized to the the pt in detail and then reviewed with pt  by my nurse highlighting any changes in therapy/plan of care  recommended at today's visit.

## 2016-10-30 NOTE — Telephone Encounter (Signed)
-----   Message from Nyoka Cowden, MD sent at 10/30/2016  4:54 AM EST ----- Needs to take one extra lasix each am x one week then come in for HRCT chest no contrast dx dyspnea on exertion

## 2016-10-30 NOTE — Assessment & Plan Note (Signed)
Body mass index is 40.7 kg/m.  trending up and now at severe level though some of this is likely water related  Lab Results  Component Value Date   TSH 1.66 10/29/2016     Contributing to gerd risk/ doe/reviewed the need and the process to achieve and maintain neg calorie balance > defer f/u primary care including intermittently monitoring thyroid status

## 2016-10-30 NOTE — Assessment & Plan Note (Signed)
HCO3  10/29/2016  = 32  With Patient Saturations on Room Air at Rest =87% -Patient Saturations on 2lpm o2= 93% - Sats walking on 4lpm 90%  rec as of 10/29/2016 = 2lpm 24/7 but increase to 4lpm walking

## 2016-10-30 NOTE — Assessment & Plan Note (Signed)
See chronic resp failure as suggested by the high bicarb levels

## 2016-11-07 ENCOUNTER — Ambulatory Visit (INDEPENDENT_AMBULATORY_CARE_PROVIDER_SITE_OTHER)
Admission: RE | Admit: 2016-11-07 | Discharge: 2016-11-07 | Disposition: A | Discharge status: Home / Self Care | Payer: Medicare Other | Source: Ambulatory Visit | Attending: Internal Medicine | Admitting: Internal Medicine

## 2016-11-07 DIAGNOSIS — R0609 Other forms of dyspnea: Secondary | ICD-10-CM

## 2016-11-07 DIAGNOSIS — R0600 Dyspnea, unspecified: Secondary | ICD-10-CM

## 2016-11-08 ENCOUNTER — Encounter: Payer: Self-pay | Admitting: Internal Medicine

## 2016-11-08 DIAGNOSIS — J8410 Pulmonary fibrosis, unspecified: Secondary | ICD-10-CM | POA: Insufficient documentation

## 2016-11-08 NOTE — Progress Notes (Signed)
Spoke with pt and notified of results per Dr. Wert. Pt verbalized understanding and denied any questions. 

## 2016-11-18 ENCOUNTER — Telehealth: Payer: Self-pay | Admitting: Internal Medicine

## 2016-11-18 NOTE — Telephone Encounter (Signed)
Called and spoke with pt and she stated that she has the incentive spirometer---she wanted to know how often she needed to use this.  It stated on her last AVS---to use the incentive spirometer as much as you can.  I advised the pt to at least use it every 1-2 hours and make sure she is expanding her lungs good with this effort.  Pt voiced her understanding.

## 2016-12-05 ENCOUNTER — Ambulatory Visit (INDEPENDENT_AMBULATORY_CARE_PROVIDER_SITE_OTHER): Payer: Medicare Other | Admitting: Internal Medicine

## 2016-12-05 ENCOUNTER — Encounter: Payer: Self-pay | Admitting: Internal Medicine

## 2016-12-05 VITALS — BP 124/74 | HR 90 | Ht 60.0 in | Wt 203.0 lb

## 2016-12-05 DIAGNOSIS — R0609 Other forms of dyspnea: Secondary | ICD-10-CM

## 2016-12-05 DIAGNOSIS — J9611 Chronic respiratory failure with hypoxia: Secondary | ICD-10-CM

## 2016-12-05 DIAGNOSIS — R0600 Dyspnea, unspecified: Secondary | ICD-10-CM

## 2016-12-05 DIAGNOSIS — J8410 Pulmonary fibrosis, unspecified: Secondary | ICD-10-CM

## 2016-12-05 DIAGNOSIS — J9612 Chronic respiratory failure with hypercapnia: Secondary | ICD-10-CM

## 2016-12-05 NOTE — Assessment & Plan Note (Signed)
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 However, there does appear to be a mild craniocaudal gradient. The possibility of early usual interstitial pneumonia (UIP) although not favored, is not entirely excluded. Accordingly, repeat high-resolution chest CT is recommended in 12 months   Very mild and not really declarative as to cause at this point but clearly improved since previous ov so for now all I rec was GERD diet and f/u with full pfts in 3 months for baseline then consider more aggressive rx   I had an extended discussion with the patient reviewing all relevant studies completed to date and  lasting 15 to 20 minutes of a 25 minute visit    Each maintenance medication was reviewed in detail including most importantly the difference between maintenance and prns and under what circumstances the prns are to be triggered using an action plan format that is not reflected in the computer generated alphabetically organized AVS.    Please see AVS for specific instructions unique to this visit that I personally wrote and verbalized to the the pt in detail and then reviewed with pt  by my nurse highlighting any  changes in therapy recommended at today's visit to their plan of care.

## 2016-12-05 NOTE — Assessment & Plan Note (Addendum)
Spirometry 06/25/2016  Purely restrictive with FVC  43% , no obst on f/v loop at all  Trial off acei 06/25/2016 > cough better 07/23/2016 but RAF > to ER   Echo 09/06/16 - Left ventricle: Abnormal septal motion There was moderate   concentric hypertrophy. Systolic function was normal. The   estimated ejection fraction was in the range of 50% to 55%. Left   ventricular diastolic function parameters were normal. - Left atrium: The atrium was moderately dilated. - Atrial septum: No defect or patent foramen ovale was identified. - Pulmonary arteries: PA peak pressure: 41 mm Hg (S).  - Spirometry 10/29/2016  uninterpretable f/v loop   - see chronic resp failure   Improved with diuresis(bnp was wnl but can be falsely low in diastolic dysfunction plus MO, which fits here/ 02 > no change rx

## 2016-12-05 NOTE — Assessment & Plan Note (Signed)
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

## 2016-12-05 NOTE — Progress Notes (Signed)
Subjective:     Patient ID: [name], female   DOB: Mar 21, 1951,    MRN: [id number]    Brief patient profile:  31 yowf quit smoking 2012 due to some cough/wheezing improved p quit at wt 180 and no obst on spirometry 06/25/16  referred to pulmonary clinic 06/25/2016 by Dr   [name] s/p admit:  Admit date: 02/05/2016 Discharge date: 02/08/2016  Discharge Diagnoses:  Principal Problem:   Acute respiratory failure with hypoxia (HCC) Active Problems:   HTN (hypertension)   PNA (pneumonia)   Glaucoma     History of present illness:  [name] is a 66 y.o. female with medical history significant for hypertension, cholesterol high and glaucoma presents to the emergency room with chief complaint of respiratory distress. Patient states that she has been short of breath for the last month. She went to an urgent care with this complaint and was given antibiotics. She states the antibiotics did not help her in the long-term. She was briefly better and then got worse again. Patient went to go get seen today for further evaluation and was found to be hypoxic into the 80s. She was sent over to the emergency room.  [facility]:  [name] is a 66 y.o. Female with a PMH of HTN, HLD,Former smoker who presented to ED with 1 month history of shortness of breath and cough (failed outpatient antibiotic). She was found to have pneumonia on x-ray chest, and admitted to the hospital for treatment of pneumonia and COPD with exacerbation.   Acute respiratory failure with hypoxia (HCC):  -Likely secondary to pneumonia/COPD above.  -She was treated with steroids, bronchodilators and empiric antibiotics.  -She significantly improved during this hospitalization -Discharged on Levaquin   COPD with exacerbation:  -Long-standing history of tobacco abuse (more than 20 years)-she quit 5 years back.  -She was treated with IV steroids -Will discharge on Prednisone taper  Mildly  decompensated diastolic heart failure:  -Echo performed on 02/06/2016 showing mild diastolic dysfunction -Discharge on Lasix 20 mg by mouth daily   History of Present Illness  06/25/2016 1st [place] [facility] office visit/ [name]   Chief Complaint  Patient presents with  . Consult visit    COPD consult per [name]- Pt states SOB w/ excertion. No congestion or tightness. Occasional coughing at times.   onset of sob spring 2017 x one month prior to admit on 02/05/16 gradually worse while on lisinopril  In April 2017 was using [facility] parking but still able to do [employer]  Now sob across the room  Sob occurs at rest if speaking  Able to lie down rotated on to left side otherwise chokes/ smothers rec Stop lotensin and start ibesartan 150 mg one daily  And your breathing should gradually improve  Please schedule a follow up office visit in 2 weeks, sooner if needed to see [name] to recheck your blood pressure  Add double the lasix dose for now    07/23/2016  f/u ov/[name] re: cough/ pseuowheeze ? Ace ? Cardiac asthma / stopped tenormin "ran out"  Chief Complaint  Patient presents with  . Follow-up    Breathing is unchanged since the last visit. She is coughing less. She is using proair 4 x daily on average.   used one puff of proair so far today Sleeps on one fat pillow ok  rec Start bystolic 10 mg now   Please remember to go to the  x-ray department  10/29/2016 acute extended ov/[name] re: new onset resp failure/ pt maint on eliquis but no rsp rx  Chief Complaint  Patient presents with  . Acute Visit    Pt c/o increased SOB and lower o2 sats for the past wk.   gradually worse sob x one week assoc with low 02 sats though baseline doe = MMRC3 = can't walk 100 yards even at a slow pace at a flat grade s stopping due to sob   No sign cough/ no chills no cp or vomiting  Leg swelling no worse than usual  rec Please see patient coordinator before you leave today  to schedule start 02  24/7 at 2lpm and ambulatory 02 titration to see if eligible for POC  Wear 02 2lpm 24/7 for now at 2lpm and increase to 4lpm with exertion Use your incentive spirometer as much as you can  Please remember to go to the lab and x-ray department downstairs in the basement  for your tests - we will call you with the results when they are available. I will arrange follow up for you here if indicated, otherwise just return to cardiology as planned  Late add:  Needs hrct next p taking one extra lasix daily x one week.     12/05/2016  f/u ov/[name] re:   NSIP/ 02 dep 2lpm  Chief Complaint  Patient presents with  . Follow-up    Breathing is back to her normal baseline.   doe improved now  = MMRC2 = can't walk a nl pace on a flat grade s sob but does fine slow and flat eg shopping [employer] ok  Can do IS @ 1liter now  Not using proair at all   No obvious day to day or daytime variability or assoc excess/ purulent sputum or mucus plugs or hemoptysis or cp or chest tightness, subjective wheeze or overt sinus or hb symptoms. No unusual exp hx or h/o childhood pna/ asthma or knowledge of premature birth.  Sleeping ok without nocturnal  or early am exacerbation  of respiratory  c/o's or need for noct saba. Also denies any obvious fluctuation of symptoms with weather or environmental changes or other aggravating or alleviating factors except as outlined above   Current Medications, Allergies, Complete Past Medical History, Past Surgical History, Family History, and Social History were reviewed in [employer] record.  ROS  The following are not active complaints unless bolded sore throat, dysphagia, dental problems, itching, sneezing,  nasal congestion or excess/ purulent secretions, ear ache,   fever, chills, sweats, unintended wt loss, classically pleuritic or exertional cp,  orthopnea pnd or leg swelling almost completely resolved, presyncope, palpitations, abdominal pain, anorexia,  nausea, vomiting, diarrhea  or change in bowel or bladder habits, change in stools or urine, dysuria,hematuria,  rash, arthralgias, visual complaints, headache, numbness, weakness or ataxia or problems with walking or coordination,  change in mood/affect or memory.                   Objective:   Physical Exam    Obese wf nad   -  Mild pseudowheeze   12/05/2016         203   07/23/2016     208   06/25/16 204 lb 1.6 oz (92.6 kg)  02/06/16 204 lb 9.4 oz (92.8 kg)  02/25/08 163 lb (73.9 kg)    Vital signs reviewed   - Note on arrival 02 sats  87% on RA  HEENT: nl dentition, turbinates, and oropharynx. Nl external ear canals without cough reflex   NECK :  without JVD/Nodes/TM/ nl carotid upstrokes bilaterally   LUNGS: no acc muscle use,  Nl contour chest with min insp crackles both bases / no exp wheeze    CV: IRIR apical rate around 125-130   no s3 or murmur or increase in P2,   pedal edema sym trace bilaterally    ABD:  Tensely obese soft and nontender with nl inspiratory excursion in the supine position. No bruits or organomegaly, bowel sounds nl  MS:  Nl gait/ ext warm without deformities, calf tenderness, cyanosis or clubbing No obvious joint restrictions   SKIN: warm and dry without lesions    NEURO:  alert, approp, nl sensorium with  no motor deficits                        Assessment:

## 2016-12-05 NOTE — Patient Instructions (Addendum)
No change in your medications or incentive spirometry   GERD (REFLUX)  is an extremely common cause of respiratory symptoms just like yours , many times with no obvious heartburn at all.    It can be treated with medication, but also with lifestyle changes including elevation of the head of your bed (ideally with 6 inch  bed blocks),  Smoking cessation, avoidance of late meals, excessive alcohol, and avoid fatty foods, chocolate, peppermint, colas, red wine, and acidic juices such as orange juice.  NO MINT OR MENTHOL PRODUCTS SO NO COUGH DROPS  USE SUGARLESS CANDY INSTEAD (Jolley ranchers or Stover's or Life Savers) or even ice chips will also do - the key is to swallow to prevent all throat clearing. NO OIL BASED VITAMINS - use powdered substitutes.    Please schedule a follow up visit in 3 months but call sooner if needed with pfts on return

## 2016-12-05 NOTE — Assessment & Plan Note (Signed)
Body mass index is 39.65 kg/m.  -  trending down/ encouraged Lab Results  Component Value Date   TSH 1.66 10/29/2016     Contributing to gerd risk/ doe/reviewed the need and the process to achieve and maintain neg calorie balance > defer f/u primary care including intermittently monitoring thyroid status

## 2016-12-12 ENCOUNTER — Encounter: Payer: Self-pay | Admitting: Interventional Cardiology

## 2017-01-01 ENCOUNTER — Ambulatory Visit (INDEPENDENT_AMBULATORY_CARE_PROVIDER_SITE_OTHER): Payer: Medicare Other | Admitting: Interventional Cardiology

## 2017-01-01 ENCOUNTER — Encounter (INDEPENDENT_AMBULATORY_CARE_PROVIDER_SITE_OTHER): Payer: Self-pay

## 2017-01-01 ENCOUNTER — Encounter: Payer: Self-pay | Admitting: Interventional Cardiology

## 2017-01-01 VITALS — BP 120/70 | HR 80 | Ht 60.0 in | Wt 208.8 lb

## 2017-01-01 DIAGNOSIS — I5032 Chronic diastolic (congestive) heart failure: Secondary | ICD-10-CM

## 2017-01-01 DIAGNOSIS — Z7901 Long term (current) use of anticoagulants: Secondary | ICD-10-CM | POA: Diagnosis not present

## 2017-01-01 DIAGNOSIS — I481 Persistent atrial fibrillation: Secondary | ICD-10-CM

## 2017-01-01 DIAGNOSIS — I4819 Other persistent atrial fibrillation: Secondary | ICD-10-CM

## 2017-01-01 DIAGNOSIS — I4891 Unspecified atrial fibrillation: Secondary | ICD-10-CM

## 2017-01-01 NOTE — Patient Instructions (Signed)

## 2017-01-01 NOTE — Progress Notes (Signed)
Cardiology Office Note   Date:  01/01/2017   ID:  [name], DOB 1950-11-03, MRN [id number]  PCP:  [name], MD    No chief complaint on file. AFib   Wt Readings from Last 3 Encounters:  01/01/17 208 lb 12.8 oz (94.7 kg)  12/05/16 203 lb (92.1 kg)  10/29/16 208 lb 6.4 oz (94.5 kg)       History of Present Illness: [name] is a 66 y.o. female  with a history of tobacco abuse. She quit smoking several years ago. She carries a diagnosis of emphysema although most recent pulmonary function test mention restrictive disease. She was seeing her pulmonologist for dyspnea on exertion. She wears oxygen continuously.   An irregular heartbeat was deteceted in 2017. ECG was done showing atrial fibrillation in 11/17.  She was admittted with CHF as well at that time.  She had a TEE/CV. She went back to AFib that same night.    No palpitations since starting Tikosyn.  No bleeding problems on anticoagulation.   At her last appt, She noted increased leg edema.  Her Lasix was increased and her Cardizem was increased as well for bette rate control.  If she is going out, she may skip a lasix dose.  She was seen in the AFib clinic.  SHe was started on Tikosyn and converted to NSR. Feels better on Tikosyn.    She is tolerating her sleep apnea treatment well.      Past Medical History:  Diagnosis Date  . Chronic diastolic CHF (congestive heart failure) (HCC)   . Emphysema of lung (HCC)   . Hypercholesterolemia   . Hypertension   . Osteoarthritis   . Persistent atrial fibrillation [facility])     Past Surgical History:  Procedure Laterality Date  . CARDIOVERSION N/A 07/25/2016   Procedure: CARDIOVERSION;  Surgeon: Pricilla Riffle, MD;  Location: [facility] ENDOSCOPY;  Service: Cardiovascular;  Laterality: N/A;  . CARDIOVERSION N/A 09/14/2016   Procedure: CARDIOVERSION;  Surgeon: Hillis Range, MD;  Location: [facility] OR;  Service: Cardiovascular;  Laterality: N/A;  . CATARACT EXTRACTION  Bilateral   . LEG SURGERY Right   . TEE WITHOUT CARDIOVERSION N/A 07/25/2016   Procedure: TRANSESOPHAGEAL ECHOCARDIOGRAM (TEE);  Surgeon: Pricilla Riffle, MD;  Location: [facility] ENDOSCOPY;  Service: Cardiovascular;  Laterality: N/A;     Current Outpatient Prescriptions  Medication Sig Dispense Refill  . acetaminophen (TYLENOL) 500 MG tablet Take 500 mg by mouth every 6 (six) hours as needed for moderate pain.    Marland Kitchen apixaban (ELIQUIS) 5 MG TABS tablet Take 1 tablet (5 mg total) by mouth 2 (two) times daily. 60 tablet 0  . atorvastatin (LIPITOR) 10 MG tablet Take 1 tablet (10 mg total) by mouth daily at 6 PM. 30 tablet 0  . diltiazem (CARDIZEM CD) 240 MG 24 hr capsule Take 1 capsule (240 mg total) by mouth daily. 90 capsule 3  . dofetilide (TIKOSYN) 500 MCG capsule Take 1 capsule (500 mcg total) by mouth 2 (two) times daily. 60 capsule 3  . furosemide (LASIX) 40 MG tablet Take 1 tablet (40 mg total) by mouth 2 (two) times daily. 90 tablet 3  . HYDROcodone-acetaminophen (NORCO/VICODIN) 5-325 MG tablet Take 1 tablet by mouth every 6 (six) hours as needed for moderate pain.    Marland Kitchen latanoprost (XALATAN) 0.005 % ophthalmic solution Place 1 drop into both eyes at bedtime.    Marland Kitchen LORazepam (ATIVAN) 1 MG tablet TAKE 1 TABLET BY MOUTH AS  NEEDED FOR ANXIETY  1  . Multiple Vitamin (MULTIVITAMIN) capsule Take 1 capsule by mouth daily.    . OXYGEN 2lpm 24/7 and 4 lpm with exertion [facility]    . Oxymetazoline HCl (SINEX ULTRA FINE MIST 12-HOUR NA) Place 1 spray into the nose daily as needed (congestion).    . potassium chloride SA (K-DUR,KLOR-CON) 20 MEQ tablet TAKE 2 TABLETS(40 MEQ) BY MOUTH TWICE DAILY 360 tablet 3  . PROAIR HFA 108 (90 Base) MCG/ACT inhaler INHALE 2 PUFFS INTO THE LUNGS EVERY 4 HOURS AS NEEDED FOR WHEEZING  0  . UNABLE TO FIND BIPAP with 2lpm o2 at night  [facility]     No current facility-administered medications for this visit.     Allergies:   Levaquin [levofloxacin in d5w] and Percocet  [oxycodone-acetaminophen]    Social History:  The patient  reports that she quit smoking about 6 years ago. Her smoking use included Cigarettes. She started smoking about 50 years ago. She has a 43.00 pack-year smoking history. She has never used smokeless tobacco. She reports that she does not drink alcohol or use drugs.   Family History:  The patient's family history is not on file. No h/o stroke   ROS:  Please see the history of present illness.   Otherwise, review of systems are positive for some leg swelling, but palpitatons have resolved.   All other systems are reviewed and negative.    PHYSICAL EXAM: VS:  BP 120/70   Pulse 80   Ht 5' (1.524 m)   Wt 208 lb 12.8 oz (94.7 kg)   SpO2 95%   BMI 40.78 kg/m  , BMI Body mass index is 40.78 kg/m. GEN: Well nourished, well developed, in no acute distress  HEENT: normal  Neck: no JVD, carotid bruits, or masses Cardiac: RRR; no murmurs, rubs, or gallops,; bilateral pedal edema  Respiratory:  No wheezing, mildly increased work of breathing GI: soft, nontender, nondistended, + BS MS: no deformity or atrophy  Skin: warm and dry, no rash Neuro:  Strength and sensation are intact Psych: euthymic mood, full affect   EKG:   The ekg ordered in 2/18 demonstrates NSR   Recent Labs: 07/23/2016: ALT 19 09/05/2016: B Natriuretic Peptide 94.5 10/09/2016: Magnesium 2.3 10/29/2016: BUN 26; Creatinine, Ser 1.01; Hemoglobin 15.9; Platelets 193.0; Potassium 4.7; Pro B Natriuretic peptide (BNP) 17.0; Sodium 140; TSH 1.66   Lipid Panel    Component Value Date/Time   CHOL 205 (H) 07/24/2016 0044   TRIG 50 07/24/2016 0044   HDL 64 07/24/2016 0044   CHOLHDL 3.2 07/24/2016 0044   VLDL 10 07/24/2016 0044   LDLCALC 131 (H) 07/24/2016 0044     Other studies Reviewed: Additional studies/ records that were reviewed today with results demonstrating: AFib clinic records.   ASSESSMENT AND PLAN:  1. AFib: Converted to NSR with Tikosyn.   2. Chronic  diastolic heart failure: Increased Lasix to 40 mg twice a day. Improvement in sx with NSR and diuresis.    Avoid excess salt in diet.  Continue diuretics.  Adjust diuretic dose based on fluid status.  3. Eliquis for stroke prevention.  No bleeding problems. 4. Using chronic home oxygen.  Weight has been stable.      Current medicines are reviewed at length with the patient today.  The patient concerns regarding her medicines were addressed.  The following changes have been made:  As above  Labs/ tests ordered today include:  No orders of the defined types were placed  in this encounter.   Recommend 150 minutes/week of aerobic exercise Low fat, low carb, high fiber diet recommended  Disposition:   FU in 1 year   Signed, Lance Muss, MD  01/01/2017 10:27 AM    [facility] [address] [place], [place], [place]  [address] Phone: [phone]; Fax: [phone]

## 2017-01-24 NOTE — Addendum Note (Signed)
Addendum  created 01/24/17 0915 by Shelton Silvas, MD   Sign clinical note

## 2017-01-27 ENCOUNTER — Telehealth (HOSPITAL_COMMUNITY): Payer: Self-pay | Admitting: *Deleted

## 2017-01-27 NOTE — Telephone Encounter (Signed)
Patient was approved for Tikosyn assistance. Pt ID #[id number].

## 2017-02-28 ENCOUNTER — Encounter: Payer: Self-pay | Admitting: Internal Medicine

## 2017-02-28 DIAGNOSIS — G4733 Obstructive sleep apnea (adult) (pediatric): Secondary | ICD-10-CM | POA: Insufficient documentation

## 2017-03-06 ENCOUNTER — Ambulatory Visit (INDEPENDENT_AMBULATORY_CARE_PROVIDER_SITE_OTHER): Payer: Medicare Other | Admitting: Internal Medicine

## 2017-03-06 ENCOUNTER — Encounter: Payer: Self-pay | Admitting: Internal Medicine

## 2017-03-06 VITALS — BP 128/82 | HR 72 | Ht 61.5 in | Wt 204.0 lb

## 2017-03-06 DIAGNOSIS — J9612 Chronic respiratory failure with hypercapnia: Secondary | ICD-10-CM

## 2017-03-06 DIAGNOSIS — J8410 Pulmonary fibrosis, unspecified: Secondary | ICD-10-CM | POA: Diagnosis not present

## 2017-03-06 DIAGNOSIS — I10 Essential (primary) hypertension: Secondary | ICD-10-CM | POA: Diagnosis not present

## 2017-03-06 DIAGNOSIS — J9611 Chronic respiratory failure with hypoxia: Secondary | ICD-10-CM | POA: Diagnosis not present

## 2017-03-06 LAB — PULMONARY FUNCTION TEST
DL/VA % PRED: 99 %
DL/VA: 4.42 ml/min/mmHg/L
DLCO COR: 13.83 ml/min/mmHg
DLCO cor % pred: 66 %
DLCO unc % pred: 67 %
DLCO unc: 14 ml/min/mmHg
FEF 25-75 PRE: 2.79 L/s
FEF 25-75 Post: 3.06 L/sec
FEF2575-%CHANGE-POST: 9 %
FEF2575-%PRED-PRE: 146 %
FEF2575-%Pred-Post: 160 %
FEV1-%Change-Post: 2 %
FEV1-%PRED-POST: 74 %
FEV1-%PRED-PRE: 72 %
FEV1-POST: 1.58 L
FEV1-PRE: 1.54 L
FEV1FVC-%Change-Post: 9 %
FEV1FVC-%PRED-PRE: 113 %
FEV6-%CHANGE-POST: -6 %
FEV6-%PRED-POST: 61 %
FEV6-%Pred-Pre: 65 %
FEV6-POST: 1.66 L
FEV6-PRE: 1.77 L
FEV6FVC-%PRED-POST: 104 %
FEV6FVC-%PRED-PRE: 104 %
FVC-%Change-Post: -6 %
FVC-%Pred-Post: 59 %
FVC-%Pred-Pre: 63 %
FVC-Post: 1.66 L
FVC-Pre: 1.77 L
POST FEV1/FVC RATIO: 95 %
Post FEV6/FVC ratio: 100 %
Pre FEV1/FVC ratio: 87 %
Pre FEV6/FVC Ratio: 100 %
RV % PRED: 61 %
RV: 1.24 L
TLC % pred: 64 %
TLC: 3.02 L

## 2017-03-06 MED ORDER — SPIRONOLACTONE 25 MG PO TABS: ORAL_TABLET | ORAL | 11 refills | Status: DC

## 2017-03-06 NOTE — Patient Instructions (Addendum)
Stop potassium and start aldactone (spironolactone) 25 mg twice daily with lasix  You will need to come by the lab in one week and we will call you results  Please schedule a follow up visit in 3 months but call sooner if needed with 6 min walk on 02 4lpm

## 2017-03-06 NOTE — Progress Notes (Signed)
PFT done today. 

## 2017-03-06 NOTE — Progress Notes (Signed)
Subjective:     Patient ID: [name], female   DOB: March 10, 1951,    MRN: [id number]    Brief patient profile:  61 yowf quit smoking 2012 due to some cough/wheezing improved p quit at wt 180 and no obst on spirometry 06/25/16  referred to pulmonary clinic 06/25/2016 by Dr   [name] s/p admit:  Admit date: 02/05/2016 Discharge date: 02/08/2016  Discharge Diagnoses:  Principal Problem:   Acute respiratory failure with hypoxia (HCC) Active Problems:   HTN (hypertension)   PNA (pneumonia)   Glaucoma     History of present illness:  [name] is a 66 y.o. female with medical history significant for hypertension, cholesterol high and glaucoma presents to the emergency room with chief complaint of respiratory distress. Patient states that she has been short of breath for the last month. She went to an urgent care with this complaint and was given antibiotics. She states the antibiotics did not help her in the long-term. She was briefly better and then got worse again. Patient went to go get seen today for further evaluation and was found to be hypoxic into the 80s. She was sent over to the emergency room.  Hospital Course:  [name] is a 66 y.o. Female with a PMH of HTN, HLD,Former smoker who presented to ED with 1 month history of shortness of breath and cough (failed outpatient antibiotic). She was found to have pneumonia on x-ray chest, and admitted to the hospital for treatment of pneumonia and COPD with exacerbation.   Acute respiratory failure with hypoxia (HCC):  -Likely secondary to pneumonia/COPD above.  -She was treated with steroids, bronchodilators and empiric antibiotics.  -She significantly improved during this hospitalization -Discharged on Levaquin   COPD with exacerbation:  -Long-standing history of tobacco abuse (more than 20 years)-she quit 5 years back.  -She was treated with IV steroids -Will discharge on Prednisone taper  Mildly  decompensated diastolic heart failure:  -Echo performed on 02/06/2016 showing mild diastolic dysfunction -Discharge on Lasix 20 mg by mouth daily   History of Present Illness  06/25/2016 1st [facility] office visit/ Juanna Pudlo   Chief Complaint  Patient presents with  . Consult visit    COPD consult per [name]- Pt states SOB w/ excertion. No congestion or tightness. Occasional coughing at times.   onset of sob spring 2017 x one month prior to admit on 02/05/16 gradually worse while on lisinopril  In April 2017 was using [facility] parking but still able to do [employer]  Now sob across the room  Sob occurs at rest if speaking  Able to lie down rotated on to left side otherwise chokes/ smothers rec Stop lotensin and start ibesartan 150 mg one daily  And your breathing should gradually improve  Please schedule a follow up office visit in 2 weeks, sooner if needed to see [name] to recheck your blood pressure  Add double the lasix dose for now    10/29/2016 acute extended ov/Baylin Cabal re: new onset resp failure/ pt maint on eliquis but no rsp rx  Chief Complaint  Patient presents with  . Acute Visit    Pt c/o increased SOB and lower o2 sats for the past wk.   gradually worse sob x one week assoc with low 02 sats though baseline doe = MMRC3 = can't walk 100 yards even at a slow pace at a flat grade s stopping due to sob   No sign cough/ no chills  no cp or vomiting  Leg swelling no worse than usual  rec Please see patient coordinator before you leave today  to schedule start 02 24/7 at 2lpm and ambulatory 02 titration to see if eligible for POC  Wear 02 2lpm 24/7 for now at 2lpm and increase to 4lpm with exertion Use your incentive spirometer as much as you can  Please remember to go to the lab and x-ray department downstairs in the basement  for your tests - we will call you with the results when they are available. 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 Follow-up    Breathing is back to her normal baseline.   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 Follow-up    PFT's done today. Breathing is unchanged. She has not had to use her rescue inhaler.      no rx x for 02 at this point / main concern is gradually worsening dep edema since last ov refractory to lasix 80 mg daily   No obvious day to day or daytime variability or assoc excess/ purulent sputum or mucus plugs or hemoptysis or cp or chest tightness, subjective wheeze or overt sinus or hb symptoms. No unusual exp hx or h/o childhood pna/ asthma or knowledge of premature birth.  Sleeping ok without nocturnal  or early am exacerbation  of respiratory  c/o's or need for noct saba. Also denies any obvious fluctuation of symptoms with weather or environmental changes or other aggravating or alleviating factors except as outlined above   Current Medications, Allergies, Complete Past Medical History, Past Surgical History, Family History, and Social History were reviewed in [employer] record.  ROS  The following are not active complaints unless bolded sore throat, dysphagia, dental problems, itching, sneezing,  nasal congestion or excess/ purulent secretions, ear ache,   fever, chills, sweats, unintended wt loss, classically pleuritic or exertional cp,  orthopnea pnd or leg swelling, presyncope, palpitations, abdominal pain, anorexia, nausea, vomiting, diarrhea  or change in bowel or bladder  habits, change in stools or urine, dysuria,hematuria,  rash, arthralgias, visual complaints, headache, numbness, weakness or ataxia or problems with walking or coordination,  change in mood/affect or memory.                      Objective:   Physical Exam    Obese wf nad       .03/06/2017        204  12/05/2016         203   07/23/2016     208   06/25/16 204 lb 1.6 oz (92.6 kg)  02/06/16 204 lb 9.4 oz (92.8 kg)  02/25/08 163 lb (73.9 kg)    Vital signs reviewed   - Note on arrival 02 sats  97%  on 2lpm      HEENT: nl dentition, turbinates, and oropharynx. Nl external ear canals without cough reflex   NECK :  without JVD/Nodes/TM/ nl carotid upstrokes bilaterally   LUNGS: no acc muscle use,  Nl contour chest with min insp crackles both bases / no exp wheeze    CV: IRIR apical rate around 125-130   no s3 or murmur or increase in P2,   2+ pittting both lower ext sym     ABD:  Tensely obese soft and nontender with nl inspiratory excursion in the supine position. No bruits or organomegaly, bowel sounds nl  MS:  Nl gait/ ext warm without deformities, calf tenderness, cyanosis or clubbing No obvious joint restrictions   SKIN: warm and dry without lesions    NEURO:  alert, approp, nl sensorium with  no motor deficits                             Assessment:

## 2017-03-07 NOTE — Assessment & Plan Note (Signed)
Body mass index is 37.92 kg/m.  -  trending up but much is water wt gain  Lab Results  Component Value Date   TSH 1.66 10/29/2016     Contributing to gerd risk/ doe/reviewed the need and the process to achieve and maintain neg calorie balance > defer f/u primary care including intermittently monitoring thyroid status

## 2017-03-07 NOTE — Assessment & Plan Note (Signed)
Trial off acei 06/25/2016 > cough better 07/23/2016 but RAF > to ER   Echo 09/06/16 - Left ventricle: Abnormal septal motion There was moderate   concentric hypertrophy. Systolic function was normal. The   estimated ejection fraction was in the range of 50% to 55%. Left   ventricular diastolic function parameters were normal. - Left atrium: The atrium was moderately dilated.  Clearly worse fluid retention ? All related to diastolic dysfunction  / refractory to lasix so added aldactone 25 bid and recheck bmet in one week   Can't rule out evolving cor pulmolale, not concerned about pe on eliquis for afib so just rx with 02 and aldactone add on  for now

## 2017-03-07 NOTE — Assessment & Plan Note (Signed)
HCO3  10/29/2016  = 32   Qualified for 02  10/29/16  Saturations on Room Air at Rest =87% - Patient Saturations on 2lpm o2= 93% - Sats walking on 4lpm 90% Started bipap per Dr Leonides Sake 11/29/16  - 12/05/2016  Patient Saturations on Room Air at Rest =88% Patient Saturations on 2lpm o2= 93%  Adequate control on present rx, reviewed in detail with pt > no change in rx needed   rec as of 03/06/2017 = 2lpm 24/7 but increase to 4lpm activity walking

## 2017-03-07 NOTE — Assessment & Plan Note (Signed)
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 However, there does appear to be a mild craniocaudal gradient. The possibility of early usual interstitial pneumonia (UIP) although not favored, is not entirely excluded. Accordingly, repeat high-resolution chest CT is recommended in 12 months  - PFT's  03/06/2017  F VC  1.77 (63%)  no % improvement from saba p 0  prior to study with DLCO  67/66 % corrects to 99  % for alv volume   No obvious cause of NSIP identified > no signs of collagen vasc dz/ no risk for HSP so plan to follow for now and focus on fluid/ wt management (see separate a/p)    I had an extended discussion with the patient reviewing all relevant studies completed to date and  lasting 15 to 20 minutes of a 25 minute visit    Each maintenance medication was reviewed in detail including most importantly the difference between maintenance and prns and under what circumstances the prns are to be triggered using an action plan format that is not reflected in the computer generated alphabetically organized AVS.    Please see AVS for specific instructions unique to this visit that I personally wrote and verbalized to the the pt in detail and then reviewed with pt  by my nurse highlighting any  changes in therapy recommended at today's visit to their plan of care.

## 2017-03-14 ENCOUNTER — Other Ambulatory Visit (INDEPENDENT_AMBULATORY_CARE_PROVIDER_SITE_OTHER): Payer: Medicare Other

## 2017-03-14 DIAGNOSIS — I10 Essential (primary) hypertension: Secondary | ICD-10-CM

## 2017-03-14 LAB — BASIC METABOLIC PANEL
BUN: 18 mg/dL (ref 6–23)
CO2: 30 mEq/L (ref 19–32)
CREATININE: 1.08 mg/dL (ref 0.40–1.20)
Calcium: 10.2 mg/dL (ref 8.4–10.5)
Chloride: 101 mEq/L (ref 96–112)
GFR: 53.79 mL/min — AB (ref >60.00)
Glucose, Bld: 133 mg/dL — ABNORMAL HIGH (ref 70–99)
POTASSIUM: 4 meq/L (ref 3.5–5.1)
Sodium: 140 mEq/L (ref 135–145)

## 2017-03-14 NOTE — Progress Notes (Signed)
Spoke with pt and notified of results per Dr. Wert. Pt verbalized understanding and denied any questions. 

## 2017-06-06 ENCOUNTER — Ambulatory Visit (INDEPENDENT_AMBULATORY_CARE_PROVIDER_SITE_OTHER): Payer: Medicare Other | Admitting: Internal Medicine

## 2017-06-06 ENCOUNTER — Encounter: Payer: Self-pay | Admitting: Internal Medicine

## 2017-06-06 ENCOUNTER — Ambulatory Visit (INDEPENDENT_AMBULATORY_CARE_PROVIDER_SITE_OTHER): Payer: Medicare Other | Admitting: *Deleted

## 2017-06-06 VITALS — BP 128/90 | HR 75 | Ht 60.0 in | Wt 200.0 lb

## 2017-06-06 DIAGNOSIS — R0609 Other forms of dyspnea: Secondary | ICD-10-CM

## 2017-06-06 DIAGNOSIS — J8410 Pulmonary fibrosis, unspecified: Secondary | ICD-10-CM

## 2017-06-06 DIAGNOSIS — J9612 Chronic respiratory failure with hypercapnia: Secondary | ICD-10-CM

## 2017-06-06 DIAGNOSIS — J441 Chronic obstructive pulmonary disease with (acute) exacerbation: Secondary | ICD-10-CM

## 2017-06-06 DIAGNOSIS — J9611 Chronic respiratory failure with hypoxia: Secondary | ICD-10-CM | POA: Diagnosis not present

## 2017-06-06 DIAGNOSIS — R0600 Dyspnea, unspecified: Secondary | ICD-10-CM

## 2017-06-06 DIAGNOSIS — J9601 Acute respiratory failure with hypoxia: Secondary | ICD-10-CM

## 2017-06-06 NOTE — Progress Notes (Signed)
Subjective:     Patient ID: [name], female   DOB: 1951/06/13,    MRN: [id number]    Brief patient profile:  26 yowf quit smoking 2012 due to some cough/wheezing improved p quit at wt 180 and no obst on spirometry 06/25/16  referred to pulmonary clinic 06/25/2016 by Dr   [name] s/p admit:  Admit date: 02/05/2016 Discharge date: 02/08/2016  Discharge Diagnoses:  Principal Problem:   Acute respiratory failure with hypoxia (HCC) Active Problems:   HTN (hypertension)   PNA (pneumonia)   Glaucoma     History of present illness:  [name] is a 66 y.o. female with medical history significant for hypertension, cholesterol high and glaucoma presents to the emergency room with chief complaint of respiratory distress. Patient states that she has been short of breath for the last month. She went to an urgent care with this complaint and was given antibiotics. She states the antibiotics did not help her in the long-term. She was briefly better and then got worse again. Patient went to go get seen today for further evaluation and was found to be hypoxic into the 80s. She was sent over to the emergency room.  Hospital Course:  [name] is a 66 y.o. Female with a PMH of HTN, HLD,Former smoker who presented to ED with 1 month history of shortness of breath and cough (failed outpatient antibiotic). She was found to have pneumonia on x-ray chest, and admitted to the hospital for treatment of pneumonia and COPD with exacerbation.   Acute respiratory failure with hypoxia (HCC):  -Likely secondary to pneumonia/COPD above.  -She was treated with steroids, bronchodilators and empiric antibiotics.  -She significantly improved during this hospitalization -Discharged on Levaquin   COPD with exacerbation:  -Long-standing history of tobacco abuse (more than 20 years)-she quit 5 years back.  -She was treated with IV steroids -Will discharge on Prednisone taper  Mildly  decompensated diastolic heart failure:  -Echo performed on 02/06/2016 showing mild diastolic dysfunction -Discharge on Lasix 20 mg by mouth daily   History of Present Illness  06/25/2016 1st [facility] office visit/ [name]   Chief Complaint  Patient presents with  . Consult visit    COPD consult per [name]- Pt states SOB w/ excertion. No congestion or tightness. Occasional coughing at times.   onset of sob spring 2017 x one month prior to admit on 02/05/16 gradually worse while on lisinopril  In April 2017 was using [facility] parking but still able to do [employer]  Now sob across the room  Sob occurs at rest if speaking  Able to lie down rotated on to left side otherwise chokes/ smothers rec Stop lotensin and start ibesartan 150 mg one daily  And your breathing should gradually improve  Please schedule a follow up office visit in 2 weeks, sooner if needed to see [name] to recheck your blood pressure  Add double the lasix dose for now    10/29/2016 acute extended ov/[name] re: new onset resp failure/ pt maint on eliquis but no rsp rx  Chief Complaint  Patient presents with  . Acute Visit    Pt c/o increased SOB and lower o2 sats for the past wk.   gradually worse sob x one week assoc with low 02 sats though baseline doe = MMRC3 = can't walk 100 yards even at a slow pace at a flat grade s stopping due to sob   No sign cough/ no chills  no cp or vomiting  Leg swelling no worse than usual  rec Please see patient coordinator before you leave today  to schedule start 02 24/7 at 2lpm and ambulatory 02 titration to see if eligible for POC  Wear 02 2lpm 24/7 for now at 2lpm and increase to 4lpm with exertion Use your incentive spirometer as much as you can  Please remember to go to the lab and x-ray department downstairs in the basement  for your tests - we will call you with the results when they are available. I will arrange follow up for you here if indicated, otherwise just return to  cardiology as planned  Late add:  Needs hrct next p taking one extra lasix daily x one week > NSIP      12/05/2016  f/u ov/Levan Aloia re:   NSIP/ 02 dep 2lpm  Chief Complaint  Patient presents with  . Follow-up    Breathing is back to her normal baseline.   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 Follow-up    PFT's done today. Breathing is unchanged. She has not had to use her rescue inhaler.      no rx x for 02 at this point / main concern is gradually worsening dep edema since last ov refractory to lasix 80 mg daily  rec Stop potassium and start aldactone (spironolactone) 25 mg twice daily with lasix You will need to come by the lab in one week and we will call you results> ok  Please schedule a follow up visit in 3 months but call sooner if needed with 6 min walk on 02 4lpm    06/06/2017  f/u ov/[name] re:  NSIP/ MO  02 2lpm 24/7 with 4lpm with activity with ok sats on 6mw at this ov  Chief Complaint  Patient presents with  . Follow-up    done today. Breathing is doing well and no new co's.    No cough /  very good response to adding aldactone to lasix with k ok a week later  Doe still = MMRC2 = can't walk a nl pace on a flat grade s sob but does fine slow and flat eg shopping on  Up to 4lpm   No obvious day to day or daytime variability or assoc excess/ purulent sputum or mucus plugs or hemoptysis or cp or chest tightness, subjective wheeze or overt sinus or hb symptoms. No unusual exp hx or h/o childhood pna/ asthma or knowledge of premature birth.  Sleeping ok flat on BIPAP per [name] without nocturnal  or early am exacerbation  of respiratory  c/o's or need for noct saba. Also denies any obvious  fluctuation of symptoms with weather or environmental changes or other aggravating or alleviating factors except as outlined above   Current Allergies, Complete Past Medical History, Past Surgical History, Family History, and Social History were reviewed in [employer] record.  ROS  The following are not active complaints unless bolded Hoarseness, sore throat, dysphagia, dental problems, itching, sneezing,  nasal congestion or discharge of excess mucus or purulent secretions, ear ache,   fever,  chills, sweats, unintended wt loss or wt gain, classically pleuritic or exertional cp,  orthopnea pnd or leg swelling, presyncope, palpitations, abdominal pain, anorexia, nausea, vomiting, diarrhea  or change in bowel habits or change in bladder habits, change in stools or change in urine, dysuria, hematuria,  rash, arthralgias, visual complaints, headache, numbness, weakness or ataxia or problems with walking or coordination,  change in mood/affect or memory.        Current Meds  Medication Sig  . acetaminophen (TYLENOL) 500 MG tablet Take 500 mg by mouth every 6 (six) hours as needed for moderate pain.  Marland Kitchen apixaban (ELIQUIS) 5 MG TABS tablet Take 1 tablet (5 mg total) by mouth 2 (two) times daily.  Marland Kitchen atorvastatin (LIPITOR) 10 MG tablet Take 1 tablet (10 mg total) by mouth daily at 6 PM.  . brimonidine (ALPHAGAN P) 0.1 % SOLN as directed.  . diltiazem (CARDIZEM CD) 240 MG 24 hr capsule Take 1 capsule (240 mg total) by mouth daily.  Marland Kitchen dofetilide (TIKOSYN) 500 MCG capsule Take 1 capsule (500 mcg total) by mouth 2 (two) times daily.  . furosemide (LASIX) 40 MG tablet Take 1 tablet (40 mg total) by mouth 2 (two) times daily.  Marland Kitchen HYDROcodone-acetaminophen (NORCO/VICODIN) 5-325 MG tablet Take 1 tablet by mouth every 6 (six) hours as needed for moderate pain.  Marland Kitchen latanoprost (XALATAN) 0.005 % ophthalmic solution Place 1 drop into both eyes at bedtime.  Marland Kitchen LORazepam (ATIVAN) 1 MG tablet TAKE 1  TABLET BY MOUTH AS NEEDED FOR ANXIETY  . Multiple Vitamin (MULTIVITAMIN) capsule Take 1 capsule by mouth daily.  . OXYGEN 2lpm 24/7 and 4 lpm with exertion AHC  . Oxymetazoline HCl (SINEX ULTRA FINE MIST 12-HOUR NA) Place 1 spray into the nose daily as needed (congestion).  Marland Kitchen spironolactone (ALDACTONE) 25 MG tablet One twice daily  . UNABLE TO FIND BIPAP with 2lpm o2 at night  AHC                         Objective:   Physical Exam    Obese wf nad      06/06/2017       200  .03/06/2017        204  12/05/2016         203   07/23/2016     208   06/25/16 204 lb 1.6 oz (92.6 kg)  02/06/16 204 lb 9.4 oz (92.8 kg)  02/25/08 163 lb (73.9 kg)    Vital signs reviewed   - Note on arrival 02 sats  97% on 2lpm      HEENT: nl dentition, turbinates, and oropharynx. Nl external ear canals without cough reflex   NECK :  without JVD/Nodes/TM/ nl carotid upstrokes bilaterally   LUNGS: no acc muscle use,  Nl contour chest with very minimal  insp crackles both bases / no exp wheeze and no cough on insp maneuvers    CV: IRIR apical rate around 125-130   no s3 or murmur or increase in P2,   trace pittting both lower ext sym     ABD:  Tensely obese soft and nontender with nl inspiratory excursion in the supine position. No bruits or organomegaly, bowel sounds nl  MS:  Nl gait/ ext warm without deformities, calf tenderness, cyanosis or clubbing No obvious joint restrictions   SKIN: warm and dry without lesions    NEURO:  alert, approp, nl sensorium with  no motor deficits  Chemistry      Component Value Date/Time   NA 140 03/14/2017 1433   NA 142 10/09/2016 1606   K 4.0 03/14/2017 1433   CL 101 03/14/2017 1433   CO2 30 03/14/2017 1433   BUN 18 03/14/2017 1433   BUN 23 10/09/2016 1606   CREATININE 1.08 03/14/2017 1433                                   Assessment:

## 2017-06-06 NOTE — Assessment & Plan Note (Addendum)
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 However, there does appear to be a mild craniocaudal gradient. The possibility of early usual interstitial pneumonia (UIP) although not favored, is not entirely excluded. Accordingly, repeat high-resolution chest CT is recommended in 12 months  - PFT's  03/06/2017  F VC  1.77 (63%)  no % improvement from saba p 0  prior to study with DLCO  67/66 % corrects to 99  % for alv volume    - 26mw  06/06/2017   304 m on 4lpm s stopping or desats  Appears stable/ improved on present rx so no need for change but return 10/2017 for hrct/ repeat pfts for mulitple points on curve and in meantime strongly rec she keep working on wt    I had an extended discussion with the patient reviewing all relevant studies completed to date and  lasting 15 to 20 minutes of a 25 minute visit    Each maintenance medication was reviewed in detail including most importantly the difference between maintenance and prns and under what circumstances the prns are to be triggered using an action plan format that is not reflected in the computer generated alphabetically organized AVS.    Please see AVS for specific instructions unique to this visit that I personally wrote and verbalized to the the pt in detail and then reviewed with pt  by my nurse highlighting any  changes in therapy recommended at today's visit to their plan of care.

## 2017-06-06 NOTE — Patient Instructions (Signed)
No change in  Medications including 2lpm 02 at rest and 4lpm with activity   We will contact you in march 2019 for your follow up studies > call us in meantime if needed

## 2017-06-06 NOTE — Progress Notes (Signed)
SIX MIN WALK 06/06/2017 10/29/2016  Medications Eliquis 5mg , Cardizem 240mg , Tikosyn , Norco 5-325mg  (1/2 tab) - all listed meds were taken at 0630 -  Supplimental Oxygen during Test? (L/min) Yes Yes  O2 Flow Rate 4 2  Type Pulse Continuous  Laps 6 -  Partial Lap (in Meters) 16 -  Baseline BP (sitting) 140/78 -  Baseline Heartrate 80 -  Baseline Dyspnea (Borg Scale) 3 -  Baseline Fatigue (Borg Scale) 3 -  Baseline SPO2 97 -  BP (sitting) 184/92 -  Heartrate 109 -  Dyspnea (Borg Scale) 5 -  Fatigue (Borg Scale) 5 -  SPO2 94 -  BP (sitting) 174/90 -  Heartrate 88 -  SPO2 98 -  Stopped or Paused before Six Minutes No -  Distance Completed 304 -  Tech Comments: Pt c/o BIL thigh pain and BIL shoulder pain  after walking approx 50 ft sats dropped to 86% 2lpm and required 4 lpm o2 to complete lap at 90%/slow pace//lmr

## 2017-06-06 NOTE — Assessment & Plan Note (Signed)
HCO3  10/29/2016  = 32   Qualified for 02  10/29/16  Saturations on Room Air at Rest =87% - Patient Saturations on 2lpm o2= 93% - Sats walking on 4lpm 90% Started bipap per Dr Leonides Sake 11/29/16  - 12/05/2016  Patient Saturations on Room Air at Rest =88% Patient Saturations on 2lpm o2= 93% - sats ok 06/06/2017 on 6 mw on 4lpm    rec as of 06/06/2017 = 2lpm 24/7 but increase to 4lpm activity walking

## 2017-06-06 NOTE — Assessment & Plan Note (Signed)
Spirometry 06/25/2016  Purely restrictive with FVC  43% , no obst on f/v loop at all  Trial off acei 06/25/2016 > cough better 07/23/2016 but RAF > to ER   Echo 09/06/16 - Left ventricle: Abnormal septal motion There was moderate   concentric hypertrophy. Systolic function was normal. The   estimated ejection fraction was in the range of 50% to 55%. Left   ventricular diastolic function parameters were normal. - Left atrium: The atrium was moderately dilated. - Atrial septum: No defect or patent foramen ovale was identified. - Pulmonary arteries: PA peak pressure: 41 mm Hg (S).  - see chronic resp failure   Much better with addition of aldactone c/w element diastolic dysfunction / cor pulmonale > no change rx

## 2017-06-09 ENCOUNTER — Telehealth: Payer: Self-pay | Admitting: Internal Medicine

## 2017-06-09 NOTE — Telephone Encounter (Signed)
Spoke with pt, reviewed 15mw test results with pt. Answered questions to best of my ability and pt seemed satisfied.  Nothing further needed.

## 2017-06-25 ENCOUNTER — Ambulatory Visit (HOSPITAL_COMMUNITY)
Admission: RE | Admit: 2017-06-25 | Discharge: 2017-06-25 | Disposition: A | Discharge status: Home / Self Care | Payer: Medicare Other | Source: Ambulatory Visit | Attending: Nurse Practitioner | Admitting: Nurse Practitioner

## 2017-06-25 ENCOUNTER — Encounter (HOSPITAL_COMMUNITY): Payer: Self-pay | Admitting: Nurse Practitioner

## 2017-06-25 VITALS — BP 144/88 | HR 92 | Ht 60.0 in | Wt 199.6 lb

## 2017-06-25 DIAGNOSIS — I4891 Unspecified atrial fibrillation: Secondary | ICD-10-CM | POA: Diagnosis present

## 2017-06-25 DIAGNOSIS — E7800 Pure hypercholesterolemia, unspecified: Secondary | ICD-10-CM | POA: Insufficient documentation

## 2017-06-25 DIAGNOSIS — I110 Hypertensive heart disease with heart failure: Secondary | ICD-10-CM | POA: Insufficient documentation

## 2017-06-25 DIAGNOSIS — Z885 Allergy status to narcotic agent status: Secondary | ICD-10-CM | POA: Diagnosis not present

## 2017-06-25 DIAGNOSIS — M1990 Unspecified osteoarthritis, unspecified site: Secondary | ICD-10-CM | POA: Insufficient documentation

## 2017-06-25 DIAGNOSIS — Z881 Allergy status to other antibiotic agents status: Secondary | ICD-10-CM | POA: Diagnosis not present

## 2017-06-25 DIAGNOSIS — Z9981 Dependence on supplemental oxygen: Secondary | ICD-10-CM | POA: Diagnosis not present

## 2017-06-25 DIAGNOSIS — Z7901 Long term (current) use of anticoagulants: Secondary | ICD-10-CM | POA: Insufficient documentation

## 2017-06-25 DIAGNOSIS — I5031 Acute diastolic (congestive) heart failure: Secondary | ICD-10-CM | POA: Diagnosis not present

## 2017-06-25 DIAGNOSIS — I481 Persistent atrial fibrillation: Secondary | ICD-10-CM | POA: Diagnosis not present

## 2017-06-25 DIAGNOSIS — R0902 Hypoxemia: Secondary | ICD-10-CM | POA: Insufficient documentation

## 2017-06-25 DIAGNOSIS — J439 Emphysema, unspecified: Secondary | ICD-10-CM | POA: Diagnosis not present

## 2017-06-25 DIAGNOSIS — I4819 Other persistent atrial fibrillation: Secondary | ICD-10-CM

## 2017-06-25 DIAGNOSIS — Z87891 Personal history of nicotine dependence: Secondary | ICD-10-CM | POA: Insufficient documentation

## 2017-06-25 DIAGNOSIS — Z79899 Other long term (current) drug therapy: Secondary | ICD-10-CM | POA: Insufficient documentation

## 2017-06-25 LAB — BASIC METABOLIC PANEL
ANION GAP: 9 (ref 5–15)
BUN: 17 mg/dL (ref 6–20)
CALCIUM: 9.8 mg/dL (ref 8.9–10.3)
CO2: 27 mmol/L (ref 22–32)
Chloride: 104 mmol/L (ref 101–111)
Creatinine, Ser: 0.85 mg/dL (ref 0.44–1.00)
Glucose, Bld: 92 mg/dL (ref 65–99)
Potassium: 3.9 mmol/L (ref 3.5–5.1)
SODIUM: 140 mmol/L (ref 135–145)

## 2017-06-25 LAB — MAGNESIUM: MAGNESIUM: 2.1 mg/dL (ref 1.7–2.4)

## 2017-06-25 NOTE — Progress Notes (Signed)
Primary Care Physician: [name], MD Referring Physician: [facility] f/u Cardiologist: [name], [name] is a 66 y.o. female that is being seen in the afib clinic for f/u hospitalization for heart failure and persistent afib with rvr.She was seen by [name] on 08/16/16 and was still in atrial fibrillation. He increased her Cardizem dose to 240 mg. He also increased her lasix to 40 mg BID given she was volume overloaded. She presented back to clinic for f/u. [name] recommended initiation of an antiarrythmic (possible flecainide), once her fluid status improved. He discussed with EP and it was determined that initiation with an AAD would be beneficial with a repeat cardioversion if still in atrial fibrillation.  In the office on 09/05/16 her EKG showed continued atrial fibrillation. HR was 84 bpm. BP was 127/90. She remained massively volume overloaded and noted increased dyspnea. Her speech was pressured when she talked. Her dyspnea was worse with exertion. She also noted orthopnea and PND. No chest pain. She reported full medication compliance and avoidance of salt. Despite increasing her lasix to 40 mg BID, she reported reduced urinary output over the last several days. She also noted a productive cough with clear colored sputum. Of note, patient reported that she was recently diagnosed with severe OSA. She is waiting a CPAP. This is being followed by her PCP.   She was sent over from the office for admission with plans for IV diuresis. Metolazone was added for addition diuresis. She was continued on IV lasix until 09/12/16. She diuresed a total of 12L this admission, and weight trended from 215>>206lbs. She remained rate controlled on oral diltiazem. EP was asked to consult on 09/09/16 for possible antiarrhythmic. Seen by [name] who recommended the addition of dofetilide once improvement in CHF symptoms. Electrolytes were stable with diuresis. She was started on tikosyn  BID on 09/12/16, QTc was stable. Then developed atrial flutter on the evening of 09/13/16, remained on Eliquis for anticoagulation without interruption. Converted back into AF. Underwent DCCV on 09/14/16 but remained in afib, but rate controlled.   She was seen by [name] on 09/15/16 and determined stable for discharge home. She will remain on tikosyn with close f/u.Marland Kitchen Home medications include Eliquis 5mg  BID, Diltiazem 240mg  daily, tikosyn BID, Lasix 40mg  BID, metolazone 2.40mh on Monday and Thursday, and potassium 40mg  BID. She was noted to be in SR on the day of discharge and felt well.   In the afib clinic she continues to feel improved. Fluid status is stable. She remains in SR. She did not get her tikosyn  from [facility] at discharge but has now. Tier exception was arranged for the pt and she will only pay $2 vrs $190 that she was first told. There is some concern re electrolyte imbalance and Tikosyn use. I discussed with [name] and he feels that if fluid status is stable and stays stable, she may be able to omit at this time.  F/u in the afib clinic and she is staying in SR on [place]. She is maintaining  her fluid weight. She is weighting daily. She states that her BP and HR are stable at home but she has noted that her pulse ox is running upper 80's/low 90's on room air and will dip into the 70's. I took for a short walk around the clinic and pulse ox to start  was 90 and desaturated to low 80's. She states that she had sleep study with  [facility] and is pending rescheduling with cpap titration. She was told that she stopped breathing a lot with low oxygen seen in the 60's.  F/u in afib clinic 10/31. She feels well. Since I saw her she went back to pulmonology for O2 desaturations into the  80's with walking  and was placed on continuous O2. She is staying in SR with Tikosyn. She feels much better. Edema much better. No issues with Eliquis.  Today, she denies symptoms of  palpitations, chest pain,  orthopnea, PND,  dizziness, presyncope, syncope, or neurologic sequela.  Chronic LLE, shortness of breath . The patient is tolerating medications without difficulties and is otherwise without complaint today.   Past Medical History:  Diagnosis Date  . Chronic diastolic CHF (congestive heart failure) (HCC)   . Emphysema of lung (HCC)   . Hypercholesterolemia   . Hypertension   . Osteoarthritis   . Persistent atrial fibrillation [facility](HCC)    Past Surgical History:  Procedure Laterality Date  . CARDIOVERSION N/A 07/25/2016   Procedure: CARDIOVERSION;  Surgeon: Pricilla RifflePaula V Ross, MD;  [facility];  Service: Cardiovascular;  Laterality: N/A;  . CARDIOVERSION N/A 09/14/2016   Procedure: CARDIOVERSION;  Surgeon: Hillis RangeJames Allred, MD;  [facility];  Service: Cardiovascular;  Laterality: N/A;  . CATARACT EXTRACTION Bilateral   . LEG SURGERY Right   . TEE WITHOUT CARDIOVERSION N/A 07/25/2016   Procedure: TRANSESOPHAGEAL ECHOCARDIOGRAM (TEE);  Surgeon: Pricilla RifflePaula V Ross, MD;  [facility];  Service: Cardiovascular;  Laterality: N/A;    Current Outpatient Prescriptions  Medication Sig Dispense Refill  . acetaminophen (TYLENOL) 500 MG tablet Take 500 mg by mouth every 6 (six) hours as needed for moderate pain.    Marland Kitchen. apixaban (ELIQUIS) 5 MG TABS tablet Take 1 tablet (5 mg total) by mouth 2 (two) times daily. 60 tablet 0  . atorvastatin (LIPITOR) 10 MG tablet Take 1 tablet (10 mg total) by mouth daily at 6 PM. 30 tablet 0  . brimonidine (ALPHAGAN P) 0.1 % SOLN as directed.    . diltiazem (CARDIZEM CD) 240 MG 24 hr capsule Take 1 capsule (240 mg total) by mouth daily. 90 capsule 3  . dofetilide (TIKOSYN) 500 MCG capsule Take 1 capsule (500 mcg total) by mouth 2 (two) times daily. 60 capsule 3  . furosemide (LASIX) 40 MG tablet Take 1 tablet (40 mg total) by mouth 2 (two) times daily. 90 tablet 3  . HYDROcodone-acetaminophen (NORCO/VICODIN) 5-325 MG tablet Take 1 tablet  by mouth every 6 (six) hours as needed for moderate pain.    Marland Kitchen. latanoprost (XALATAN) 0.005 % ophthalmic solution Place 1 drop into both eyes at bedtime.    Marland Kitchen. LORazepam (ATIVAN) 1 MG tablet TAKE 1 TABLET BY MOUTH AS NEEDED FOR ANXIETY  1  . Multiple Vitamin (MULTIVITAMIN) capsule Take 1 capsule by mouth daily.    . OXYGEN 2lpm 24/7 and 4 lpm with exertion A[facility]    . Oxymetazoline HCl (SINEX ULTRA FINE MIST 12-HOUR NA) Place 1 spray into the nose daily as needed (congestion).    Marland Kitchen. spironolactone (ALDACTONE) 25 MG tablet One twice daily 60 tablet 11  . UNABLE TO FIND BIPAP with 2lpm o2 at night  A[facility]     No current facility-administered medications for this encounter.     Allergies  Allergen Reactions  . Levaquin [Levofloxacin In D5w] Rash  . Percocet [Oxycodone-Acetaminophen] Itching    Social History   Social History  . Marital status: Widowed  Spouse name: N/A  . Number of children: N/A  . Years of education: N/A   Occupational History  . Not on file.   Social History Main Topics  . Smoking status: Former Smoker    Packs/day: 1.00    Years: 43.00    Types: Cigarettes    Start date: 08/26/1966    Quit date: 08/26/2010  . Smokeless tobacco: Never Used  . Alcohol use No  . Drug use: No  . Sexual activity: Not on file   Other Topics Concern  . Not on file   Social History Narrative  . No narrative on file    Family History  Problem Relation Age of Onset  . Stroke Neg Hx     ROS- All systems are reviewed and negative except as per the HPI above  Physical Exam: Vitals:   06/25/17 1127  BP: (!) 144/88  Pulse: 92  Weight: 199 lb 9.6 oz (90.5 kg)  Height: 5' (1.524 m)   Wt Readings from Last 3 Encounters:  06/25/17 199 lb 9.6 oz (90.5 kg)  06/06/17 200 lb (90.7 kg)  03/06/17 204 lb (92.5 kg)    Labs: Lab Results  Component Value Date   NA 140 06/25/2017   K 3.9 06/25/2017   CL 104 06/25/2017   CO2 27 06/25/2017   GLUCOSE 92 06/25/2017   BUN 17  06/25/2017   CREATININE 0.85 06/25/2017   CALCIUM 9.8 06/25/2017   MG 2.1 06/25/2017   Lab Results  Component Value Date   INR 1.28 09/13/2016   Lab Results  Component Value Date   CHOL 205 (H) 07/24/2016   HDL 64 07/24/2016   LDLCALC 131 (H) 07/24/2016   TRIG 50 07/24/2016     GEN- The patient is well appearing, alert and oriented x 3 today.   Head- normocephalic, atraumatic Eyes-  Sclera clear, conjunctiva pink Ears- hearing intact Oropharynx- clear Neck- supple, no JVP Lymph- no cervical lymphadenopathy Lungs- Clear to ausculation bilaterally, normal work of breathing Heart- Regular rate and rhythm, no murmurs, rubs or gallops, PMI not laterally displaced GI- soft, NT, ND, + BS Extremities- no clubbing, cyanosis, or 1t pitting edema( improved) MS- no significant deformity or atrophy Skin- no rash or lesion Psych- euthymic mood, full affect Neuro- strength and sensation are intact  EKG- NSR at 92 bpm, pr int , qrs int 74 ms, qtc 447 ms (stable) Epic records reviewed   Assessment and Plan: 1. Persistent symptomatic afib Is maintaining SR on Tikosyn Tikosyn precautions discussed Continue apixaban for CHA2DS2VASc score of at least 4. Bmet/mag today  2. Acute on chronic diastolic heart failure Continue lasix  Pt states that  weight is stable She is avoiding salt  3. Hypoxia Improved with continuous O2   F/u in 3 months here and with [name] as scheduled per recall   Elvina Sidle. [facility] [address] [place], [place] [address] [phone]

## 2017-08-13 ENCOUNTER — Other Ambulatory Visit: Payer: Self-pay | Admitting: Interventional Cardiology

## 2017-08-13 NOTE — Telephone Encounter (Signed)
This is a A-Fib pt 

## 2017-08-27 ENCOUNTER — Other Ambulatory Visit (HOSPITAL_COMMUNITY): Payer: Self-pay | Admitting: *Deleted

## 2017-08-27 ENCOUNTER — Other Ambulatory Visit (HOSPITAL_COMMUNITY): Payer: Self-pay | Admitting: Nurse Practitioner

## 2017-08-27 MED ORDER — DOFETILIDE 500 MCG PO CAPS: 500.0000 ug | ORAL_CAPSULE | Freq: Two times a day (BID) | ORAL | 0 refills | Status: DC

## 2017-09-08 ENCOUNTER — Telehealth (HOSPITAL_COMMUNITY): Payer: Self-pay | Admitting: *Deleted

## 2017-09-08 NOTE — Telephone Encounter (Signed)
Patient approved for tikosyn patient assistance through 08/25/18. ID #[id number]

## 2017-09-24 ENCOUNTER — Ambulatory Visit (HOSPITAL_COMMUNITY): Payer: Medicare Other | Admitting: Nurse Practitioner

## 2017-09-26 ENCOUNTER — Telehealth: Payer: Self-pay | Admitting: *Deleted

## 2017-09-26 DIAGNOSIS — J8410 Pulmonary fibrosis, unspecified: Secondary | ICD-10-CM

## 2017-09-26 NOTE — Telephone Encounter (Signed)
Spoke with the pt and notified HRCT, ov and PFT due in March  She verbalized understanding  I have scheduled the ov and pft and sent order to [facility] to schedule the HRCT

## 2017-09-26 NOTE — Telephone Encounter (Signed)
-----   Message from Nyoka Cowden, MD sent at 06/06/2017 10:47 AM EDT ----- hrct chest and pfts due this m

## 2017-10-01 ENCOUNTER — Encounter (HOSPITAL_COMMUNITY): Payer: Self-pay | Admitting: Nurse Practitioner

## 2017-10-01 ENCOUNTER — Ambulatory Visit (HOSPITAL_COMMUNITY)
Admission: RE | Admit: 2017-10-01 | Discharge: 2017-10-01 | Disposition: A | Discharge status: Home / Self Care | Payer: Medicare Other | Source: Ambulatory Visit | Attending: Nurse Practitioner | Admitting: Nurse Practitioner

## 2017-10-01 VITALS — BP 128/72 | HR 83 | Ht 60.0 in | Wt 195.0 lb

## 2017-10-01 DIAGNOSIS — Z885 Allergy status to narcotic agent status: Secondary | ICD-10-CM | POA: Insufficient documentation

## 2017-10-01 DIAGNOSIS — I481 Persistent atrial fibrillation: Secondary | ICD-10-CM

## 2017-10-01 DIAGNOSIS — Z87891 Personal history of nicotine dependence: Secondary | ICD-10-CM | POA: Insufficient documentation

## 2017-10-01 DIAGNOSIS — Z881 Allergy status to other antibiotic agents status: Secondary | ICD-10-CM | POA: Insufficient documentation

## 2017-10-01 DIAGNOSIS — I110 Hypertensive heart disease with heart failure: Secondary | ICD-10-CM | POA: Insufficient documentation

## 2017-10-01 DIAGNOSIS — I5033 Acute on chronic diastolic (congestive) heart failure: Secondary | ICD-10-CM | POA: Diagnosis present

## 2017-10-01 DIAGNOSIS — M1990 Unspecified osteoarthritis, unspecified site: Secondary | ICD-10-CM | POA: Diagnosis not present

## 2017-10-01 DIAGNOSIS — Z79899 Other long term (current) drug therapy: Secondary | ICD-10-CM | POA: Insufficient documentation

## 2017-10-01 DIAGNOSIS — R0902 Hypoxemia: Secondary | ICD-10-CM | POA: Insufficient documentation

## 2017-10-01 DIAGNOSIS — I4819 Other persistent atrial fibrillation: Secondary | ICD-10-CM

## 2017-10-01 DIAGNOSIS — E7800 Pure hypercholesterolemia, unspecified: Secondary | ICD-10-CM | POA: Diagnosis not present

## 2017-10-01 DIAGNOSIS — J439 Emphysema, unspecified: Secondary | ICD-10-CM | POA: Insufficient documentation

## 2017-10-01 LAB — BASIC METABOLIC PANEL
ANION GAP: 12 (ref 5–15)
BUN: 17 mg/dL (ref 6–20)
CALCIUM: 9.7 mg/dL (ref 8.9–10.3)
CHLORIDE: 104 mmol/L (ref 101–111)
CO2: 25 mmol/L (ref 22–32)
Creatinine, Ser: 0.88 mg/dL (ref 0.44–1.00)
GFR calc non Af Amer: >60 mL/min (ref >60)
Glucose, Bld: 91 mg/dL (ref 65–99)
Potassium: 4 mmol/L (ref 3.5–5.1)
Sodium: 141 mmol/L (ref 135–145)

## 2017-10-01 LAB — MAGNESIUM: Magnesium: 2.2 mg/dL (ref 1.7–2.4)

## 2017-10-01 NOTE — Progress Notes (Signed)
Primary Care Physician: [name], MD Referring Physician: [facility] f/u Cardiologist: Dr, [name] is a 67 y.o. female that is being seen in the afib clinic for f/u hospitalization for heart failure and persistent afib with rvr.She was seen by Dr. [name] on 08/16/16 and was still in atrial fibrillation. He increased her Cardizem dose to 240 mg. He also increased her lasix to 40 mg BID given she was volume overloaded. She presented back to clinic for f/u. [name] recommended initiation of an antiarrythmic (possible flecainide), once her fluid status improved. He discussed with EP and it was determined that initiation with an AAD would be beneficial with a repeat cardioversion if still in atrial fibrillation.  In the office on 09/05/16 her EKG showed continued atrial fibrillation. HR was 84 bpm. BP was 127/90. She remained massively volume overloaded and noted increased dyspnea. Her speech was pressured when she talked. Her dyspnea was worse with exertion. She also noted orthopnea and PND. No chest pain. She reported full medication compliance and avoidance of salt. Despite increasing her lasix to 40 mg BID, she reported reduced urinary output over the last several days. She also noted a productive cough with clear colored sputum. Of note, patient reported that she was recently diagnosed with severe OSA. She is waiting a CPAP. This is being followed by her PCP.   She was sent over from the office for admission with plans for IV diuresis. Metolazone was added for addition diuresis. She was continued on IV lasix until 09/12/16. She diuresed a total of 12L this admission, and weight trended from 215>>206lbs. She remained rate controlled on oral diltiazem. EP was asked to consult on 09/09/16 for possible antiarrhythmic. Seen by Dr. [name] who recommended the addition of dofetilide once improvement in CHF symptoms. Electrolytes were stable with diuresis. She was started on tikosyn  BID on 09/12/16, QTc was stable. Then developed atrial flutter on the evening of 09/13/16, remained on Eliquis for anticoagulation without interruption. Converted back into AF. Underwent DCCV on 09/14/16 but remained in afib, but rate controlled.   She was seen by Dr. [name] on 09/15/16 and determined stable for discharge home. She will remain on tikosyn with close f/u.Marland Kitchen Home medications include Eliquis 5mg  BID, Diltiazem 240mg  daily, tikosyn BID, Lasix 40mg  BID, metolazone 2.79mh on Monday and Thursday, and potassium 40mg  BID. She was noted to be in SR on the day of discharge and felt well.   In the afib clinic 09/19/16 she continues to feel improved. Fluid status is stable. She remains in SR. She did not get her tikosyn  from [facility] at discharge but has now. Tier exception was arranged for the pt and she will only pay $2 vrs $190 that she was first told. There is some concern re electrolyte imbalance and Tikosyn use. I discussed with Dr. [name] and he feels that if fluid status is stable and stays stable, she may be able to omit at this time.  F/u in the afib clinic 2/26, and she is staying in SR on tikosyn. She is maintaining  her fluid weight. She is weighting daily. She states that her BP and HR are stable at home but she has noted that her pulse ox is running upper 80's/low 90's on room air and will dip into the 70's. I took for a short walk around the clinic and pulse ox to start  was 90 and desaturated to low 80's. She states that she had sleep  study with [facility] and is pending rescheduling with cpap titration. She was told that she stopped breathing a lot with low oxygen seen in the 60's.  F/u in afib clinic 06/25/17. She feels well. Since I saw her she went back to pulmonology for O2 desaturations into the  80's with walking  and was placed on continuous O2. She is staying in SR with Tikosyn. She feels much better. Edema much better. No issues with Eliquis.  F/u in afib clinic,  10/01/17. She continues to do well on Tikosyn with low afib burden. One episode of afib that lasted less than 24 hours. No issues with Doac. Continues with o2 via . Weight is stable.   Today, she denies symptoms of palpitations, chest pain,  orthopnea, PND,  dizziness, presyncope, syncope, or neurologic sequela.  Chronic LLE, shortness of breath . The patient is tolerating medications without difficulties and is otherwise without complaint today.   Past Medical History:  Diagnosis Date  . Chronic diastolic CHF (congestive heart failure) (HCC)   . Emphysema of lung (HCC)   . Hypercholesterolemia   . Hypertension   . Osteoarthritis   . Persistent atrial fibrillation [facility])    Past Surgical History:  Procedure Laterality Date  . CARDIOVERSION N/A 07/25/2016   Procedure: CARDIOVERSION;  Surgeon: Pricilla Riffle, MD;  Location: [facility] ENDOSCOPY;  Service: Cardiovascular;  Laterality: N/A;  . CARDIOVERSION N/A 09/14/2016   Procedure: CARDIOVERSION;  Surgeon: Hillis Range, MD;  Location: [facility] OR;  Service: Cardiovascular;  Laterality: N/A;  . CATARACT EXTRACTION Bilateral   . LEG SURGERY Right   . TEE WITHOUT CARDIOVERSION N/A 07/25/2016   Procedure: TRANSESOPHAGEAL ECHOCARDIOGRAM (TEE);  Surgeon: Pricilla Riffle, MD;  Location: [facility] ENDOSCOPY;  Service: Cardiovascular;  Laterality: N/A;    Current Outpatient Medications  Medication Sig Dispense Refill  . acetaminophen (TYLENOL) 500 MG tablet Take 500 mg by mouth every 6 (six) hours as needed for moderate pain.    Marland Kitchen apixaban (ELIQUIS) 5 MG TABS tablet Take 1 tablet (5 mg total) by mouth 2 (two) times daily. 60 tablet 0  . atorvastatin (LIPITOR) 10 MG tablet Take 1 tablet (10 mg total) by mouth daily at 6 PM. 30 tablet 0  . brimonidine (ALPHAGAN P) 0.1 % SOLN as directed.    Marland Kitchen CARTIA XT 240 MG 24 hr capsule TAKE 1 CAPSULE(240 MG) BY MOUTH DAILY 90 capsule 2  . dofetilide (TIKOSYN) 500 MCG capsule TAKE 1 CAPSULE(500 MCG) BY MOUTH TWICE DAILY 180 capsule 2    . furosemide (LASIX) 40 MG tablet Take 1 tablet (40 mg total) by mouth 2 (two) times daily. 90 tablet 3  . HYDROcodone-acetaminophen (NORCO/VICODIN) 5-325 MG tablet Take 1 tablet by mouth every 6 (six) hours as needed for moderate pain.    Marland Kitchen latanoprost (XALATAN) 0.005 % ophthalmic solution Place 1 drop into both eyes at bedtime.    Marland Kitchen LORazepam (ATIVAN) 1 MG tablet TAKE 1 TABLET BY MOUTH AS NEEDED FOR ANXIETY  1  . Multiple Vitamin (MULTIVITAMIN) capsule Take 1 capsule by mouth daily.    . OXYGEN 2lpm 24/7 and 4 lpm with exertion [facility]    . Oxymetazoline HCl (SINEX ULTRA FINE MIST 12-HOUR NA) Place 1 spray into the nose daily as needed (congestion).    Marland Kitchen UNABLE TO FIND BIPAP with 2lpm o2 at night  [facility]     No current facility-administered medications for this encounter.     Allergies  Allergen Reactions  . Levaquin [Levofloxacin In  D5w] Rash  . Percocet [Oxycodone-Acetaminophen] Itching    Social History   Socioeconomic History  . Marital status: Widowed    Spouse name: Not on file  . Number of children: Not on file  . Years of education: Not on file  . Highest education level: Not on file  Social Needs  . Financial resource strain: Not on file  . Food insecurity - worry: Not on file  . Food insecurity - inability: Not on file  . Transportation needs - medical: Not on file  . Transportation needs - non-medical: Not on file  Occupational History  . Not on file  Tobacco Use  . Smoking status: Former Smoker    Packs/day: 1.00    Years: 43.00    Pack years: 43.00    Types: Cigarettes    Start date: 08/26/1966    Last attempt to quit: 08/26/2010    Years since quitting: 7.1  . Smokeless tobacco: Never Used  Substance and Sexual Activity  . Alcohol use: No    Alcohol/week: 0.0 oz  . Drug use: No  . Sexual activity: Not on file  Other Topics Concern  . Not on file  Social History Narrative  . Not on file    Family History  Problem Relation Age of Onset  . Stroke Neg Hx      ROS- All systems are reviewed and negative except as per the HPI above  Physical Exam: Vitals:   10/01/17 1110  BP: 128/72  Pulse: 83  Weight: 195 lb (88.5 kg)  Height: 5' (1.524 m)   Wt Readings from Last 3 Encounters:  10/01/17 195 lb (88.5 kg)  06/25/17 199 lb 9.6 oz (90.5 kg)  06/06/17 200 lb (90.7 kg)    Labs: Lab Results  Component Value Date   NA 141 10/01/2017   K 4.0 10/01/2017   CL 104 10/01/2017   CO2 25 10/01/2017   GLUCOSE 91 10/01/2017   BUN 17 10/01/2017   CREATININE 0.88 10/01/2017   CALCIUM 9.7 10/01/2017   MG 2.2 10/01/2017   Lab Results  Component Value Date   INR 1.28 09/13/2016   Lab Results  Component Value Date   CHOL 205 (H) 07/24/2016   HDL 64 07/24/2016   LDLCALC 131 (H) 07/24/2016   TRIG 50 07/24/2016     GEN- The patient is well appearing, alert and oriented x 3 today.   Head- normocephalic, atraumatic Eyes-  Sclera clear, conjunctiva pink Ears- hearing intact Oropharynx- clear Neck- supple, no JVP Lymph- no cervical lymphadenopathy Lungs- Clear to ausculation bilaterally, normal work of breathing Heart- Regular rate and rhythm, no murmurs, rubs or gallops, PMI not laterally displaced GI- soft, NT, ND, + BS Extremities- no clubbing, cyanosis, or 1t pitting edema( improved) MS- no significant deformity or atrophy Skin- no rash or lesion Psych- euthymic mood, full affect Neuro- strength and sensation are intact  EKG- NSR at 83 bpm, pr int 152 ms, qrs int 78 ms, qtc 453 ms (stable) Epic records reviewed   Assessment and Plan: 1. Persistent symptomatic afib Is maintaining SR on Tikosyn Tikosyn precautions reviewed Still is receiving from company free of charge  Continue apixaban for CHA2DS2VASc score of at least 4. Bmet/mag today  2. Acute on chronic diastolic heart failure Continue lasix  Weight is stable She is avoiding salt  3. Hypoxia Improved with continuous O2 Per Dr. Sherene Sires   F/u in 3 months as  scheduled with Dr. Isabel Caprice afib clinic as needed  Geroge Baseman [name], [facility] [address] [place], [place] [address] [phone]

## 2017-10-24 ENCOUNTER — Inpatient Hospital Stay: Admission: RE | Admit: 2017-10-24 | Payer: Medicare Other | Source: Ambulatory Visit

## 2017-10-27 ENCOUNTER — Ambulatory Visit: Payer: Medicare Other | Admitting: Internal Medicine

## 2017-11-13 ENCOUNTER — Ambulatory Visit (INDEPENDENT_AMBULATORY_CARE_PROVIDER_SITE_OTHER)
Admission: RE | Admit: 2017-11-13 | Discharge: 2017-11-13 | Disposition: A | Discharge status: Home / Self Care | Payer: Medicare Other | Source: Ambulatory Visit | Attending: Internal Medicine | Admitting: Internal Medicine

## 2017-11-13 DIAGNOSIS — J8410 Pulmonary fibrosis, unspecified: Secondary | ICD-10-CM | POA: Diagnosis not present

## 2017-11-17 ENCOUNTER — Other Ambulatory Visit: Payer: Self-pay | Admitting: Internal Medicine

## 2017-11-17 DIAGNOSIS — J9612 Chronic respiratory failure with hypercapnia: Principal | ICD-10-CM

## 2017-11-17 DIAGNOSIS — J9611 Chronic respiratory failure with hypoxia: Secondary | ICD-10-CM

## 2017-11-18 ENCOUNTER — Ambulatory Visit (INDEPENDENT_AMBULATORY_CARE_PROVIDER_SITE_OTHER): Payer: Medicare Other | Admitting: Internal Medicine

## 2017-11-18 ENCOUNTER — Encounter: Payer: Self-pay | Admitting: Internal Medicine

## 2017-11-18 ENCOUNTER — Other Ambulatory Visit (INDEPENDENT_AMBULATORY_CARE_PROVIDER_SITE_OTHER): Payer: Medicare Other

## 2017-11-18 ENCOUNTER — Ambulatory Visit: Payer: Medicare Other | Admitting: Internal Medicine

## 2017-11-18 VITALS — BP 128/84 | HR 73 | Ht 61.5 in | Wt 198.0 lb

## 2017-11-18 DIAGNOSIS — J8410 Pulmonary fibrosis, unspecified: Secondary | ICD-10-CM

## 2017-11-18 DIAGNOSIS — J9611 Chronic respiratory failure with hypoxia: Secondary | ICD-10-CM | POA: Diagnosis not present

## 2017-11-18 DIAGNOSIS — J9612 Chronic respiratory failure with hypercapnia: Secondary | ICD-10-CM

## 2017-11-18 DIAGNOSIS — I77810 Thoracic aortic ectasia: Secondary | ICD-10-CM

## 2017-11-18 LAB — PULMONARY FUNCTION TEST
DL/VA % PRED: 94 %
DL/VA: 4.23 ml/min/mmHg/L
DLCO COR: 12.53 ml/min/mmHg
DLCO cor % pred: 59 %
DLCO unc % pred: 64 %
DLCO unc: 13.39 ml/min/mmHg
FEF 25-75 Post: 3.45 L/sec
FEF 25-75 Pre: 2.48 L/sec
FEF2575-%CHANGE-POST: 38 %
FEF2575-%Pred-Post: 184 %
FEF2575-%Pred-Pre: 132 %
FEV1-%Change-Post: 4 %
FEV1-%PRED-PRE: 73 %
FEV1-%Pred-Post: 76 %
FEV1-PRE: 1.55 L
FEV1-Post: 1.62 L
FEV1FVC-%CHANGE-POST: 2 %
FEV1FVC-%Pred-Pre: 119 %
FEV6-%Change-Post: 0 %
FEV6-%Pred-Post: 63 %
FEV6-%Pred-Pre: 64 %
FEV6-PRE: 1.71 L
FEV6-Post: 1.7 L
FEV6FVC-%PRED-PRE: 104 %
FEV6FVC-%Pred-Post: 104 %
FVC-%Change-Post: 1 %
FVC-%PRED-POST: 62 %
FVC-%PRED-PRE: 61 %
FVC-POST: 1.74 L
FVC-Pre: 1.71 L
POST FEV1/FVC RATIO: 93 %
Post FEV6/FVC ratio: 100 %
Pre FEV1/FVC ratio: 91 %
Pre FEV6/FVC Ratio: 100 %
RV % PRED: 80 %
RV: 1.62 L
TLC % pred: 77 %
TLC: 3.62 L

## 2017-11-18 LAB — SEDIMENTATION RATE: Sed Rate: 20 mm/hr (ref 0–30)

## 2017-11-18 NOTE — Progress Notes (Signed)
Subjective:     Patient ID: [name], female   DOB: Sep 23, 1950,    MRN: [id number]    Brief patient profile:  36 yowf quit smoking 2012 due to some cough/wheezing improved p quit at wt 180 and no obst on spirometry 06/25/16  referred to pulmonary clinic 06/25/2016 by Dr   [name] s/p admit:     Admit date: 02/05/2016 Discharge date: 02/08/2016  Discharge Diagnoses:  Principal Problem:   Acute respiratory failure with hypoxia (HCC) Active Problems:   HTN (hypertension)   PNA (pneumonia)   Glaucoma     History of present illness:  [name] is a 67 y.o. female with medical history significant for hypertension, cholesterol high and glaucoma presents to the emergency room with chief complaint of respiratory distress. Patient states that she has been short of breath for the last month. She went to an urgent care with this complaint and was given antibiotics. She states the antibiotics did not help her in the long-term. She was briefly better and then got worse again. Patient went to go get seen today for further evaluation and was found to be hypoxic into the 80s. She was sent over to the emergency room.  Hospital Course:  [name] is a 68 y.o. Female with a PMH of HTN, HLD,Former smoker who presented to ED with 1 month history of shortness of breath and cough (failed outpatient antibiotic). She was found to have pneumonia on x-ray chest, and admitted to the hospital for treatment of pneumonia and COPD with exacerbation.   Acute respiratory failure with hypoxia (HCC):  -Likely secondary to pneumonia/COPD above.  -She was treated with steroids, bronchodilators and empiric antibiotics.  -She significantly improved during this hospitalization -Discharged on Levaquin   COPD with exacerbation:  -Long-standing history of tobacco abuse (more than 20 years)-she quit 5 years back.  -She was treated with IV steroids -Will discharge on Prednisone  taper  Mildly decompensated diastolic heart failure:  -Echo performed on 02/06/2016 showing mild diastolic dysfunction -Discharge on Lasix 20 mg by mouth daily   History of Present Illness  06/25/2016 1st [facility] office visit/ Adel Burch   Chief Complaint  Patient presents with  . Consult visit    COPD consult per [name]- Pt states SOB w/ excertion. No congestion or tightness. Occasional coughing at times.   onset of sob spring 2017 x one month prior to admit on 02/05/16 gradually worse while on lisinopril  In April 2017 was using [facility] parking but still able to do [employer]  Now sob across the room  Sob occurs at rest if speaking  Able to lie down rotated on to left side otherwise chokes/ smothers rec Stop lotensin and start ibesartan 150 mg one daily  And your breathing should gradually improve  Please schedule a follow up office visit in 2 weeks, sooner if needed to see [name] to recheck your blood pressure  Add double the lasix dose for now    10/29/2016 acute extended ov/Sarrah Fiorenza re: new onset resp failure/ pt maint on eliquis but no rsp rx  Chief Complaint  Patient presents with  . Acute Visit    Pt c/o increased SOB and lower o2 sats for the past wk.   gradually worse sob x one week assoc with low 02 sats though baseline doe = MMRC3 = can't walk 100 yards even at a slow pace at a flat grade s stopping due to sob   No sign  cough/ no chills no cp or vomiting  Leg swelling no worse than usual  rec Please see patient coordinator before you leave today  to schedule start 02 24/7 at 2lpm and ambulatory 02 titration to see if eligible for POC  Wear 02 2lpm 24/7 for now at 2lpm and increase to 4lpm with exertion Use your incentive spirometer as much as you can  Please remember to go to the lab and x-ray department downstairs in the basement  for your tests - we will call you with the results when they are available. I will arrange follow up for you here if indicated, otherwise  just return to cardiology as planned  Late add:  Needs hrct next p taking one extra lasix daily x one week > NSIP      12/05/2016  f/u ov/Jaqulyn Chancellor re:   NSIP/ 02 dep 2lpm  Chief Complaint  Patient presents with  . Follow-up    Breathing is back to her normal baseline.   doe improved now  = MMRC2 = can't walk a nl pace on a flat grade s sob but does fine slow and flat eg shopping [employer] ok  Can do IS @ 1liter now  Not using proair at all  rec Reflux diet    03/06/2017  f/u ov/Primo Innis re: NSIP/MO 02 dep 2lpm at rest/sleep and 4lpm poc with acitive/  Chief Complaint  Patient presents with  . Follow-up    PFT's done today. Breathing is unchanged. She has not had to use her rescue inhaler.    no rx x for 02 at this point / main concern is gradually worsening dep edema since last ov refractory to lasix 80 mg daily  rec Stop potassium and start aldactone (spironolactone) 25 mg twice daily with lasix You will need to come by the lab in one week and we will call you results> ok  Please schedule a follow up visit in 3 months but call sooner if needed with 6 min walk on 02 4lpm    06/06/2017  f/u ov/Sparrow [place]usa re:  NSIP/ MO  02 2lpm 24/7 with 4lpm with activity with ok sats on 6mw at this ov  Chief Complaint  Patient presents with  . Follow-up    done today. Breathing is doing well and no new co's.    No cough /  very good response to adding aldactone to lasix with k ok a week later  Doe still = MMRC2 = can't walk a nl pace on a flat grade s sob but does fine slow and flat eg shopping on  Up to 4lpm  rec No change in  Medications including 2lpm 02 at rest and 4lpm with activity      11/18/2017  f/u ov/Di Jasmer re:  ?NSIP last bird exp 2-3 years  02 2lpm bipap and 2lpm with activity  Chief Complaint  Patient presents with  . Follow-up    PFT's done today. Breathing is unchanged.   Dyspnea:  Still shopping at [employer]/ wearing 2lpm with activity  Cough: none Sleep: sleep ok on cpap/ 02 SABA  use:  n/a [name]  Now following for Cards   No obvious day to day or daytime variability or assoc excess/ purulent sputum or mucus plugs or hemoptysis or cp or chest tightness, subjective wheeze or overt sinus or hb symptoms. No unusual exposure hx or h/o childhood pna/ asthma or knowledge of premature birth.  Sleeping ok on BIPAP with 02 without nocturnal  or early  am exacerbation  of respiratory  c/o's or need for noct saba. Also denies any obvious fluctuation of symptoms with weather or environmental changes or other aggravating or alleviating factors except as outlined above   Current Allergies, Complete Past Medical History, Past Surgical History, Family History, and Social History were reviewed in [employer] record.  ROS  The following are not active complaints unless bolded Hoarseness, sore throat, dysphagia, dental problems, itching, sneezing,  nasal congestion or discharge of excess mucus or purulent secretions, ear ache,   fever, chills, sweats, unintended wt loss or wt gain, classically pleuritic or exertional cp,  orthopnea pnd or leg swelling, presyncope, palpitations, abdominal pain, anorexia, nausea, vomiting, diarrhea  or change in bowel habits or change in bladder habits, change in stools or change in urine, dysuria, hematuria,  rash, arthralgias, visual complaints, headache, numbness, weakness or ataxia or problems with walking or coordination,  change in mood/affect or memory.        Current Meds  Medication Sig  . acetaminophen (TYLENOL) 500 MG tablet Take 500 mg by mouth every 6 (six) hours as needed for moderate pain.  Marland Kitchen apixaban (ELIQUIS) 5 MG TABS tablet Take 1 tablet (5 mg total) by mouth 2 (two) times daily.  Marland Kitchen atorvastatin (LIPITOR) 10 MG tablet Take 1 tablet (10 mg total) by mouth daily at 6 PM.  . brimonidine (ALPHAGAN P) 0.1 % SOLN as directed.  Marland Kitchen CARTIA XT 240 MG 24 hr capsule TAKE 1 CAPSULE(240 MG) BY MOUTH DAILY  . dofetilide  (TIKOSYN) 500 MCG capsule TAKE 1 CAPSULE(500 MCG) BY MOUTH TWICE DAILY  . furosemide (LASIX) 40 MG tablet Take 1 tablet (40 mg total) by mouth 2 (two) times daily.  Marland Kitchen HYDROcodone-acetaminophen (NORCO/VICODIN) 5-325 MG tablet Take 1 tablet by mouth every 6 (six) hours as needed for moderate pain.  Marland Kitchen latanoprost (XALATAN) 0.005 % ophthalmic solution Place 1 drop into both eyes at bedtime.  Marland Kitchen LORazepam (ATIVAN) 1 MG tablet TAKE 1 TABLET BY MOUTH AS NEEDED FOR ANXIETY  . Multiple Vitamin (MULTIVITAMIN) capsule Take 1 capsule by mouth daily.  . OXYGEN 2lpm 24/7 and 4 lpm with exertion [facility]  . UNABLE TO FIND BIPAP with 2lpm o2 at night  [facility]                      Objective:   Physical Exam     obese wf nad   11/18/2017         198  06/06/2017       200  .03/06/2017        204  12/05/2016         203   07/23/2016     208   06/25/16 204 lb 1.6 oz (92.6 kg)  02/06/16 204 lb 9.4 oz (92.8 kg)  02/25/08 163 lb (73.9 kg)       Vital signs reviewed - Note on arrival 02 sats  96% on RA           HEENT: nl dentition, turbinates bilaterally, and oropharynx. Nl external ear canals without cough reflex   NECK :  without JVD/Nodes/TM/ nl carotid upstrokes bilaterally   LUNGS: no acc muscle use,  Nl contour chest with minimal crackles in bases on insp  bilaterally without cough on insp or exp maneuvers   CV:  IRIR  no s3 or murmur or increase in P2, and  Trace ankle edema sym bilaterally  ABD:  soft and nontender with nl  inspiratory excursion in the supine position. No bruits or organomegaly appreciated, bowel sounds nl  MS:  Nl gait/ ext warm without deformities, calf tenderness, cyanosis or clubbing No obvious joint restrictions   SKIN: warm and dry without lesions    NEURO:  alert, approp, nl sensorium with  no motor or cerebellar deficits apparent.              I personally reviewed images and agree with radiology impression as follows:   Chest HRCT 32/21/19 1.  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 2. 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 Recommend annual imaging followup by CTA or MRA.         Labs ordered 11/18/2017  HSP serology/ collagen vasc profile     Assessment:

## 2017-11-18 NOTE — Progress Notes (Signed)
PFT completed today 11/18/17  

## 2017-11-18 NOTE — Patient Instructions (Signed)
Avoid bird exposure   I will let [name] know about your Aortic enlargement but the main treatment is to lose weight and keep your blood pressure down   Please remember to go to the lab department downstairs in the basement  for your tests - we will call you with the results when they are available.   Please schedule a follow up visit in 6 months but call sooner if needed

## 2017-11-20 ENCOUNTER — Encounter: Payer: Self-pay | Admitting: Internal Medicine

## 2017-11-20 DIAGNOSIS — I77810 Thoracic aortic ectasia: Secondary | ICD-10-CM | POA: Insufficient documentation

## 2017-11-20 NOTE — Assessment & Plan Note (Addendum)
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 However, there does appear to be a mild craniocaudal gradient. The possibility of early usual interstitial pneumonia (UIP) although not favored, is not entirely excluded.   - PFT's  03/06/2017  FVC  1.77 (63%)  no % improvement from saba p 0  prior to study with DLCO  67/66 % corrects to 99  % for alv volume   - 40mw  06/06/2017   304 m on 4lpm s stopping or desats - HRCT chest 11/13/17 1. 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  -  11/18/2017  HSP serology/ collagen vasc profile - PFT's  11/18/2017  FVC 1.71 (61%)  with DLCO  64/59c  % corrects to 94 % for alv volume  And ERV 34%  No evidence at all of dz progression subjectively or objectively > sent NSIP laba/ no change rx though does need to avoid all bird exposure as assoc between NSIP and HSP    I had an extended discussion with the patient reviewing all relevant studies completed to date and  lasting 15 to 20 minutes of a 25 minute visit    Each maintenance medication was reviewed in detail including most importantly the difference between maintenance and prns and under what circumstances the prns are to be triggered using an action plan format that is not reflected in the computer generated alphabetically organized AVS.    Please see AVS for specific instructions unique to this visit that I personally wrote and verbalized to the the pt in detail and then reviewed with pt  by my nurse highlighting any  changes in therapy recommended at today's visit to their plan of care.

## 2017-11-20 NOTE — Assessment & Plan Note (Signed)
Body mass index is 36.81 kg/m.  -  trending down slightly/ encouraged Lab Results  Component Value Date   TSH 1.66 10/29/2016     Contributing to gerd risk/ doe/reviewed the need and the process to achieve and maintain neg calorie balance > defer f/u primary care including intermittently monitoring thyroid status

## 2017-11-20 NOTE — Assessment & Plan Note (Signed)
HCO3  10/29/2016  = 32   Qualified for 02  10/29/16  Saturations on Room Air at Rest =87% - Patient Saturations on 2lpm o2= 93% - Sats walking on 4lpm 90% Started bipap per Dr Leonides Sake 11/29/16  - 12/05/2016  Patient Saturations on Room Air at Rest =88% Patient Saturations on 2lpm o2= 93% - sats ok 06/06/2017 on 6 mw on 4lpm  - HCO3 10/01/17 = 25 so hypercarbia clearly better    rec as of 11/18/2017 = 2lpm hs with cpap and as needed with activity > reviewed strategy to be sure 02 sats are > 90%  with exertion

## 2017-11-20 NOTE — Assessment & Plan Note (Signed)
See CT chest 11/14/17 = 4.0 cm   Discussed in detail all the  indications, usual  risks and alternatives  relative to the benefits with patient who agrees to proceed with conservative f/u as outlined  > need to make PCP and cards aware

## 2017-11-23 LAB — RHEUMATOID FACTOR: Rhuematoid fact SerPl-aCnc: <14 IU/mL (ref <14)

## 2017-11-23 LAB — ANA: Anti Nuclear Antibody(ANA): NEGATIVE

## 2017-11-23 LAB — HYPERSENSITIVITY PNUEMONITIS PROFILE
ASPERGILLUS FUMIGATUS: NEGATIVE
FAENIA RETIVIRGULA: NEGATIVE
PIGEON SERUM: NEGATIVE
S. VIRIDIS: NEGATIVE
T. CANDIDUS: NEGATIVE
T. VULGARIS: NEGATIVE

## 2017-11-23 LAB — CYCLIC CITRUL PEPTIDE ANTIBODY, IGG: Cyclic Citrullin Peptide Ab: 24 UNITS — ABNORMAL HIGH

## 2017-11-24 NOTE — Progress Notes (Signed)
Spoke with pt and notified of results per Dr. Wert. Pt verbalized understanding and denied any questions. 

## 2017-12-08 NOTE — Progress Notes (Signed)
Cardiology Office Note   Date:  12/09/2017   ID:  [name], DOB 01-May-1951, MRN [id number]  PCP:  [name], MD    No chief complaint on file.  AFib  Wt Readings from Last 3 Encounters:  12/09/17 190 lb (86.2 kg)  11/18/17 198 lb (89.8 kg)  10/01/17 195 lb (88.5 kg)       History of Present Illness: [name] is a 67 y.o. female  with a history of tobacco abuse. She quit smoking several years ago. She carries a diagnosis of emphysema although most recent pulmonary function test mention restrictive disease. She was seeing her pulmonologist for dyspnea on exertion. She wears oxygen continuously.   An irregular heartbeat was deteceted in 2017. ECG was done showing atrial fibrillation in 11/17.  She was admittted with CHF as well at that time.  She had a TEE/CV. She went back to AFib that same night.    Eventually started on Tikosyn and followed in AFib clinic.  3/19 CT scan showed 2 vessel atherosclerosis and mildly dilated thoracic aneurysm. No anginal sx.    Denies : Chest pain. Dizziness. Leg edema. Nitroglycerin use. Orthopnea. Palpitations. Paroxysmal nocturnal dyspnea. Shortness of breath. Syncope.   Getting Tikosyn from the AFib clinic for free.   Past Medical History:  Diagnosis Date  . Chronic diastolic CHF (congestive heart failure) (HCC)   . Emphysema of lung (HCC)   . Hypercholesterolemia   . Hypertension   . Osteoarthritis   . Persistent atrial fibrillation [facility])     Past Surgical History:  Procedure Laterality Date  . CARDIOVERSION N/A 07/25/2016   Procedure: CARDIOVERSION;  Surgeon: Pricilla Riffle, MD;  Location: [facility] ENDOSCOPY;  Service: Cardiovascular;  Laterality: N/A;  . CARDIOVERSION N/A 09/14/2016   Procedure: CARDIOVERSION;  Surgeon: Hillis Range, MD;  Location: [facility] OR;  Service: Cardiovascular;  Laterality: N/A;  . CATARACT EXTRACTION Bilateral   . LEG SURGERY Right   . TEE WITHOUT CARDIOVERSION N/A 07/25/2016   Procedure:  TRANSESOPHAGEAL ECHOCARDIOGRAM (TEE);  Surgeon: Pricilla Riffle, MD;  Location: [facility] ENDOSCOPY;  Service: Cardiovascular;  Laterality: N/A;     Current Outpatient Medications  Medication Sig Dispense Refill  . acetaminophen (TYLENOL) 500 MG tablet Take 500 mg by mouth every 6 (six) hours as needed for moderate pain.    Marland Kitchen apixaban (ELIQUIS) 5 MG TABS tablet Take 1 tablet (5 mg total) by mouth 2 (two) times daily. 60 tablet 0  . atorvastatin (LIPITOR) 10 MG tablet Take 1 tablet (10 mg total) by mouth daily at 6 PM. 30 tablet 0  . brimonidine (ALPHAGAN P) 0.1 % SOLN as directed.    Marland Kitchen CARTIA XT 240 MG 24 hr capsule TAKE 1 CAPSULE(240 MG) BY MOUTH DAILY 90 capsule 2  . dofetilide (TIKOSYN) 500 MCG capsule TAKE 1 CAPSULE(500 MCG) BY MOUTH TWICE DAILY 180 capsule 2  . furosemide (LASIX) 40 MG tablet Take 1 tablet (40 mg total) by mouth 2 (two) times daily. 90 tablet 3  . HYDROcodone-acetaminophen (NORCO/VICODIN) 5-325 MG tablet Take 1 tablet by mouth every 6 (six) hours as needed for moderate pain.    Marland Kitchen latanoprost (XALATAN) 0.005 % ophthalmic solution Place 1 drop into both eyes at bedtime.    Marland Kitchen LORazepam (ATIVAN) 1 MG tablet TAKE 1 TABLET BY MOUTH AS NEEDED FOR ANXIETY  1  . Multiple Vitamin (MULTIVITAMIN) capsule Take 1 capsule by mouth daily.    . OXYGEN 2lpm 24/7 and 4 lpm with exertion  [facility]    . Oxymetazoline HCl (SINEX ULTRA FINE MIST 12-HOUR NA) Place 1 spray into the nose as needed (congestion).    Marland Kitchen spironolactone (ALDACTONE) 25 MG tablet Take 25 mg by mouth 2 (two) times daily.    Marland Kitchen UNABLE TO FIND BIPAP with 2lpm o2 at night  [facility]     No current facility-administered medications for this visit.     Allergies:   Levaquin [levofloxacin in d5w] and Percocet [oxycodone-acetaminophen]    Social History:  The patient  reports that she quit smoking about 7 years ago. Her smoking use included cigarettes. She started smoking about 51 years ago. She has a 43.00 pack-year smoking history. She has  never used smokeless tobacco. She reports that she does not drink alcohol or use drugs.   Family History:  The patient's family history is not on file. Father with valve surgery   ROS:  Please see the history of present illness.   Otherwise, review of systems are positive for intentional weight loss.   All other systems are reviewed and negative.    PHYSICAL EXAM: VS:  BP 124/80   Pulse 72   Ht 5' 1.5" (1.562 m)   Wt 190 lb (86.2 kg)   SpO2 98%   BMI 35.32 kg/m  , BMI Body mass index is 35.32 kg/m. GEN: Well nourished, well developed, in no acute distress  HEENT: normal  Neck: no JVD, carotid bruits, or masses Cardiac: RRR; no murmurs, rubs, or gallops,no edema  Respiratory:  clear to auscultation bilaterally, normal work of breathing GI: soft, nontender, nondistended, + BS MS: no deformity or atrophy  Skin: warm and dry, no rash Neuro:  Strength and sensation are intact Psych: euthymic mood, full affect    Recent Labs: 10/01/2017: BUN 17; Creatinine, Ser 0.88; Magnesium 2.2; Potassium 4.0; Sodium 141   Lipid Panel    Component Value Date/Time   CHOL 205 (H) 07/24/2016 0044   TRIG 50 07/24/2016 0044   HDL 64 07/24/2016 0044   CHOLHDL 3.2 07/24/2016 0044   VLDL 10 07/24/2016 0044   LDLCALC 131 (H) 07/24/2016 0044     Other studies Reviewed: Additional studies/ records that were reviewed today with results demonstrating: LDL 76.   ASSESSMENT AND PLAN:  1. AFib: persistent.  Tikosyn.  Seen in AFib clinic in 2/19.  Note reviewed.  Electrolytes checked in their clinic. 2. Chronic diastolic heart failure: Appears euvolemic.   3. Anticoagulated: No bleeding problems.  Continue current stroke prevention therapy. 4. Home oxygen for COPD.  Wears it always with exertion, sometimes at rest.  5. Coronary calcium: Seen on CT scan. No angina.  Lipids well controlled.  Continue preventive therapy.  6. Small thoracic aneurysm: Plan for CT scan in one year.     Current medicines  are reviewed at length with the patient today.  The patient concerns regarding her medicines were addressed.  The following changes have been made:  No change  Labs/ tests ordered today include:  No orders of the defined types were placed in this encounter.   Recommend 150 minutes/week of aerobic exercise Low fat, low carb, high fiber diet recommended  Disposition:   FU in 6 months   Signed, Lance Muss, MD  12/09/2017 11:16 AM    [facility] [address] [place], [place], [place]  [address] Phone: [phone]; Fax: [phone]

## 2017-12-09 ENCOUNTER — Encounter: Payer: Self-pay | Admitting: Interventional Cardiology

## 2017-12-09 ENCOUNTER — Ambulatory Visit: Payer: Medicare Other | Admitting: Interventional Cardiology

## 2017-12-09 VITALS — BP 124/80 | HR 72 | Ht 61.5 in | Wt 190.0 lb

## 2017-12-09 DIAGNOSIS — I481 Persistent atrial fibrillation: Secondary | ICD-10-CM | POA: Diagnosis not present

## 2017-12-09 DIAGNOSIS — I2510 Atherosclerotic heart disease of native coronary artery without angina pectoris: Secondary | ICD-10-CM | POA: Diagnosis not present

## 2017-12-09 DIAGNOSIS — I5032 Chronic diastolic (congestive) heart failure: Secondary | ICD-10-CM | POA: Diagnosis not present

## 2017-12-09 DIAGNOSIS — I4819 Other persistent atrial fibrillation: Secondary | ICD-10-CM

## 2017-12-09 DIAGNOSIS — Z7901 Long term (current) use of anticoagulants: Secondary | ICD-10-CM

## 2017-12-09 DIAGNOSIS — I712 Thoracic aortic aneurysm, without rupture, unspecified: Secondary | ICD-10-CM

## 2017-12-09 DIAGNOSIS — I10 Essential (primary) hypertension: Secondary | ICD-10-CM

## 2017-12-09 NOTE — Patient Instructions (Signed)

## 2018-01-07 ENCOUNTER — Other Ambulatory Visit: Payer: Self-pay | Admitting: Interventional Cardiology

## 2018-05-15 ENCOUNTER — Other Ambulatory Visit: Payer: Self-pay | Admitting: Nurse Practitioner

## 2018-05-21 ENCOUNTER — Ambulatory Visit (INDEPENDENT_AMBULATORY_CARE_PROVIDER_SITE_OTHER): Payer: Medicare Other | Admitting: Internal Medicine

## 2018-05-21 ENCOUNTER — Encounter: Payer: Self-pay | Admitting: Internal Medicine

## 2018-05-21 DIAGNOSIS — J8410 Pulmonary fibrosis, unspecified: Secondary | ICD-10-CM

## 2018-05-21 DIAGNOSIS — J9611 Chronic respiratory failure with hypoxia: Secondary | ICD-10-CM | POA: Diagnosis not present

## 2018-05-21 DIAGNOSIS — J9612 Chronic respiratory failure with hypercapnia: Secondary | ICD-10-CM

## 2018-05-21 DIAGNOSIS — Z23 Encounter for immunization: Secondary | ICD-10-CM | POA: Diagnosis not present

## 2018-05-21 NOTE — Progress Notes (Signed)
Subjective:     Patient ID: [name], female   DOB: 11-Apr-1951,    MRN: [id number]    Brief patient profile:  50 yowf quit smoking 2012 due to some cough/wheezing improved p quit at wt 180 and no obst on spirometry 06/25/16  referred to pulmonary clinic 06/25/2016 by Dr   Shirline Frees s/p admit:   Admit date: 02/05/2016 Discharge date: 02/08/2016  Discharge Diagnoses:  Principal Problem:   Acute respiratory failure with hypoxia ([place]) Active Problems:   HTN (hypertension)   PNA (pneumonia)   Glaucoma      History of Present Illness  06/25/2016 1st [facility] office visit/ Dugan Vanhoesen   ? NSIP  Chief Complaint  Patient presents with  . Consult visit    COPD consult per [name]- Pt states SOB w/ excertion. No congestion or tightness. Occasional coughing at times.   onset of sob spring 2017 x one month prior to admit on 02/05/16 gradually worse while on lisinopril  In April 2017 was using [facility] parking but still able to do [employer]  Now sob across the room  Sob occurs at rest if speaking  Able to lie down rotated on to left side otherwise chokes/ smothers rec Stop lotensin and start ibesartan 150 mg one daily  And your breathing should gradually improve  Please schedule a follow up office visit in 2 weeks, sooner if needed to see [name] to recheck your blood pressure  Add double the lasix dose for now    10/29/2016 acute extended ov/Estefana Taylor re: new onset resp failure/ pt maint on eliquis but no rsp rx  Chief Complaint  Patient presents with  . Acute Visit    Pt c/o increased SOB and lower o2 sats for the past wk.   gradually worse sob x one week assoc with low 02 sats though baseline doe = MMRC3 = can't walk 100 yards even at a slow pace at a flat grade s stopping due to sob   No sign cough/ no chills no cp or vomiting  Leg swelling no worse than usual  rec Please see patient coordinator before you leave today  to schedule start 02 24/7 at 2lpm and ambulatory 02  titration to see if eligible for POC  Wear 02 2lpm 24/7 for now at 2lpm and increase to 4lpm with exertion Use your incentive spirometer as much as you can  Please remember to go to the lab and x-ray department downstairs in the basement  for your tests - we will call you with the results when they are available. 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 Follow-up    PFT's done today. Breathing is unchanged. She has not had to use her rescue inhaler.    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 Follow-up    PFT's done today. Breathing is unchanged.   Dyspnea:  Still shopping at [employer]/ wearing 2lpm with activity  Cough: none Sleep: sleep ok on cpap/ 02 SABA use:  n/a [name]  Now following for Cards rec Avoid bird exposure  I will let [name] know about your Aortic enlargement but the main treatment is to lose weight and keep your blood pressure down     05/21/2018  f/u ov/Ethanjames Fontenot re:  ? 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 Followup    Breathing is doing well and no new co's.   Dyspnea:  Ex up to 3 min on 02 3lpm  And sats 94% when stops due to sob  Cough: none Sleeping: bipap/ 2lpm  SABA use: none  02: as above     No obvious day to day or daytime variability or assoc excess/ purulent sputum or mucus plugs or hemoptysis or cp or chest  tightness, subjective wheeze or overt sinus or hb symptoms.   Sleeping as above flat position  without nocturnal  or early am exacerbation  of respiratory  c/o's or need for noct saba. Also denies any obvious fluctuation of symptoms with weather or environmental changes or other aggravating or alleviating factors except as outlined above   No unusual exposure hx or h/o childhood pna/ asthma or knowledge of premature birth.  Current Allergies, Complete Past Medical History, Past Surgical History, Family History, and Social History were reviewed in [employer] record.  ROS  The following are not active complaints unless bolded Hoarseness, sore throat, dysphagia, dental problems, itching, sneezing,  nasal congestion or discharge of excess mucus or purulent secretions, ear ache,   fever, chills, sweats, unintended wt loss or wt gain, classically pleuritic or exertional cp,  orthopnea pnd or arm/hand swelling  or leg swelling, presyncope, palpitations, abdominal pain, anorexia, nausea, vomiting, diarrhea  or change in bowel habits or change in bladder habits, change in stools or change in urine, dysuria, hematuria,  rash, arthralgias, visual complaints, headache, numbness, weakness or ataxia or problems with walking or coordination,  change in mood or  memory.        Current Meds  Medication Sig  . acetaminophen (TYLENOL) 500 MG tablet Take 500 mg by mouth every 6 (six) hours as needed for moderate pain.  Marland Kitchen apixaban (ELIQUIS) 5 MG TABS tablet Take 1 tablet (5 mg total) by mouth 2 (two) times daily.  Marland Kitchen atorvastatin (LIPITOR) 10 MG tablet Take 1 tablet (10 mg total) by mouth daily at 6 PM.  . brimonidine (ALPHAGAN P) 0.1 % SOLN as directed.  . diltiazem (CARDIZEM CD) 240 MG 24 hr capsule TAKE ONE CAPSULE BY MOUTH DAILY  . dofetilide (TIKOSYN) 500 MCG capsule TAKE 1 CAPSULE(500 MCG) BY MOUTH TWICE DAILY  . furosemide (LASIX) 40 MG tablet Take 1 tablet (40 mg total) by mouth 2 (two)  times daily.  . furosemide (LASIX) 40 MG tablet TAKE 1 TABLET(40 MG) BY MOUTH DAILY  . HYDROcodone-acetaminophen (NORCO/VICODIN) 5-325 MG tablet Take 1 tablet by mouth every 6 (six) hours as needed for moderate pain.  Marland Kitchen latanoprost (XALATAN) 0.005 % ophthalmic solution Place 1 drop into both eyes at bedtime.  Marland Kitchen LORazepam (ATIVAN) 1 MG tablet TAKE 1 TABLET BY MOUTH AS NEEDED FOR ANXIETY  . Multiple Vitamin (MULTIVITAMIN) capsule Take 1 capsule by mouth daily.  . OXYGEN 2lpm  24/7 and 4 lpm with exertion [facility]     BIPAP with 2lpm o2 at night  [facility]                   Objective:   Physical Exam    Obese wf nad   05/21/2018         190  11/18/2017         198  06/06/2017       200  .03/06/2017        204  12/05/2016         203   07/23/2016     208   06/25/16 204 lb 1.6 oz (92.6 kg)  02/06/16 204 lb 9.4 oz (92.8 kg)  02/25/08 163 lb (73.9 kg)     Vital signs reviewed - Note on arrival 02 sats  94% on RA             HEENT: nl dentition, turbinates bilaterally, and oropharynx. Nl external ear canals without cough reflex   NECK :  without JVD/Nodes/TM/ nl carotid upstrokes bilaterally   LUNGS: no acc muscle use,  Nl contour chest with min insp crackles bases  bilaterally without cough on insp or exp maneuvers   CV:  RRR  no s3 or murmur or increase in P2, and trace pedal edema bilaterally  ABD:  soft and nontender with nl inspiratory excursion in the supine position. No bruits or organomegaly appreciated, bowel sounds nl  MS:  Nl gait/ ext warm without deformities, calf tenderness, cyanosis or clubbing No obvious joint restrictions   SKIN: warm and dry without lesions    NEURO:  alert, approp, nl sensorium with  no motor or cerebellar deficits apparent.                        Assessment:

## 2018-05-21 NOTE — Patient Instructions (Signed)
Please schedule a follow up visit in 6 months but call sooner if needed  

## 2018-05-22 ENCOUNTER — Encounter: Payer: Self-pay | Admitting: Internal Medicine

## 2018-05-22 NOTE — Assessment & Plan Note (Signed)
HCO3  10/29/2016  = 32   Qualified for 02  10/29/16  Saturations on Room Air at Rest =87% - Patient Saturations on 2lpm o2= 93% - Sats walking on 4lpm 90% Started bipap per Dr Leonides Sake 11/29/16  - 12/05/2016  Patient Saturations on Room Air at Rest =88% Patient Saturations on 2lpm o2= 93% - sats ok 06/06/2017 on 6 mw on 4lpm  - HCO3 10/01/17 = 25 so hypercarbia clearly better  - 05/21/2018   [facility] RA  2 laps @ 185 ft each stopped due to desats to 86% corrected on 2lpm / slow pace    rec as of 05/21/2018 = 2lpm hs with cpap and as needed with activity up to 3lpm    Adequate control on present rx, reviewed in detail with pt > no change in rx needed      I had an extended discussion with the patient reviewing all relevant studies completed to date and  lasting 15 to 20 minutes of a 25 minute visit    Each maintenance medication was reviewed in detail including most importantly the difference between maintenance and prns and under what circumstances the prns are to be triggered using an action plan format that is not reflected in the computer generated alphabetically organized AVS.     Please see AVS for specific instructions unique to this visit that I personally wrote and verbalized to the the pt in detail and then reviewed with pt  by my nurse highlighting any  changes in therapy recommended at today's visit to their plan of care.

## 2018-05-22 NOTE — Assessment & Plan Note (Signed)
-   PFT's  03/06/2017  F VC  1.77 (63%)  no % improvement from saba p 0  prior to study with DLCO  67/66 % corrects to 99  % for alv volume   - 39mw  06/06/2017   304 m on 4lpm s stopping or desats - HRCT chest 11/13/17 1. 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 -  11/18/2017  HSP serology/ collagen vasc profile >  Neg  - PFT's  11/18/2017  FVC 1.71 (61%)  with DLCO  64/59c  % corrects to 94 % for alv volume  And ERV 34%   Despite neg HSP serology, given chronic bird exp this is the leading suspect though NSIP could I suppose also represent previous ALI from numerous sources  - good news is it appears stable radiographically and clinically and therefore f/u a 6 m approp - see avs for instructions unique to this ov

## 2018-06-29 ENCOUNTER — Telehealth (HOSPITAL_COMMUNITY): Payer: Self-pay | Admitting: *Deleted

## 2018-06-29 NOTE — Telephone Encounter (Signed)
Patient approved for eliquis patient assistance through 08/25/18. Application #[id number]

## 2018-07-20 ENCOUNTER — Other Ambulatory Visit (HOSPITAL_COMMUNITY): Payer: Self-pay | Admitting: *Deleted

## 2018-07-20 MED ORDER — DOFETILIDE 500 MCG PO CAPS: ORAL_CAPSULE | ORAL | 3 refills | Status: DC

## 2018-07-29 ENCOUNTER — Ambulatory Visit: Payer: Medicare Other | Admitting: Interventional Cardiology

## 2018-07-29 ENCOUNTER — Encounter: Payer: Self-pay | Admitting: Physician Assistant

## 2018-07-29 NOTE — Progress Notes (Signed)
Cardiology Office Note    Date:  07/30/2018  ID:  [name], DOB 09-03-1950, MRN [id number] PCP:  [name], MD  Cardiologist:  Lance Muss, MD   Chief Complaint: f/u afib, CHF - 6 months  History of Present Illness:  [name] is a 67 y.o. female with history of persistent atrial fibrillation on Tikosyn, chronic respiratory failure (listed as NSIP/MO per pulm note), coronary atherosclerosis by CT, descending thoracic aorta ectasia, chronic diastolic CHF, HTN, HLD, obesity who presents for 6 month follow-up. Her afib was diagnosed in 2017, also in the setting of CHF. She underwent TEE/DCCV but reverted back to AF. She was eventually started on Tikosyn and followed by afib clinic. 2D echo last checked 08/2016 showed EF 50-55%, normal diastolic parameters, PASP . High res CT 10/2017 showed NSIP, 4.0cm ectasia of descending thoracic aorta, 2V Coronary atherosclerosis. Regarding coronary calcium she had not had any angina so had been monitored with RF modification for now. Repeat CT was suggested 10/2018. Last pertinent cardiac labs 09/2017 showed Mg 2.2, K 4.0, Cr 0.88, 10/2016 TSH wnl, Hgb 15.9, last LDL 10/2017 by [facility] was 76.  She returns for follow-up doing well from a cardiac standpoint. She is wearing festive fairy hair woven into her regular hair. No CP. No change in chronic dyspnea. No palpitations. No significant edema or orthopnea. Weight is up by scale in our office today but she reports it is very stable at home around 194lb. She has noticed intermittently the last week she's had some throbbing in her mid anterior shin down towards her foot. No known injury. No redness, swelling, pain with flexing. It is worse when she lies down and better when she walks. It is present only intermittently. It is not tender to palpation.    Past Medical History:  Diagnosis Date  . Chronic diastolic CHF (congestive heart failure) (HCC)   . Chronic respiratory failure (HCC)   .  Coronary atherosclerosis    a. 2V by CT 10/2017.  . Emphysema of lung (HCC)   . Hypercholesterolemia   . Hypertension   . NSIP (nonspecific interstitial pneumonia) (HCC)   . Osteoarthritis   . Persistent atrial fibrillation   . Thoracic aortic ectasia [facility])     Past Surgical History:  Procedure Laterality Date  . CARDIOVERSION N/A 07/25/2016   Procedure: CARDIOVERSION;  Surgeon: Pricilla Riffle, MD;  Location: [facility] ENDOSCOPY;  Service: Cardiovascular;  Laterality: N/A;  . CARDIOVERSION N/A 09/14/2016   Procedure: CARDIOVERSION;  Surgeon: Hillis Range, MD;  Location: [facility] OR;  Service: Cardiovascular;  Laterality: N/A;  . CATARACT EXTRACTION Bilateral   . LEG SURGERY Right   . TEE WITHOUT CARDIOVERSION N/A 07/25/2016   Procedure: TRANSESOPHAGEAL ECHOCARDIOGRAM (TEE);  Surgeon: Pricilla Riffle, MD;  Location[facility] ENDOSCOPY;  Service: Cardiovascular;  Laterality: N/A;    Current Medications: Current Meds  Medication Sig  . acetaminophen (TYLENOL) 500 MG tablet Take 500 mg by mouth every 6 (six) hours as needed for moderate pain.  Marland Kitchen apixaban (ELIQUIS) 5 MG TABS tablet Take 1 tablet (5 mg total) by mouth 2 (two) times daily.  Marland Kitchen atorvastatin (LIPITOR) 10 MG tablet Take 1 tablet (10 mg total) by mouth daily at 6 PM.  . brimonidine (ALPHAGAN P) 0.1 % SOLN as directed.  . diltiazem (CARDIZEM CD) 240 MG 24 hr capsule TAKE ONE CAPSULE BY MOUTH DAILY  . dofetilide (TIKOSYN) 500 MCG capsule TAKE 1 CAPSULE(500 MCG) BY MOUTH TWICE DAILY  . furosemide (LASIX)  40 MG tablet TAKE 1 TABLET(40 MG) BY MOUTH DAILY  . HYDROcodone-acetaminophen (NORCO/VICODIN) 5-325 MG tablet Take 1 tablet by mouth every 6 (six) hours as needed for moderate pain.  Marland Kitchen latanoprost (XALATAN) 0.005 % ophthalmic solution Place 1 drop into both eyes at bedtime.  Marland Kitchen LORazepam (ATIVAN) 1 MG tablet TAKE 1 TABLET BY MOUTH AS NEEDED FOR ANXIETY  . Multiple Vitamin (MULTIVITAMIN) capsule Take 1 capsule by mouth daily.  . OXYGEN 2lpm 24/7 and 4 lpm  with exertion [facility]  . UNABLE TO FIND BIPAP with 2lpm o2 at night  [facility]   Allergies:   Levaquin [levofloxacin in d5w] and Percocet [oxycodone-acetaminophen]   Social History   Socioeconomic History  . Marital status: Widowed    Spouse name: Not on file  . Number of children: Not on file  . Years of education: Not on file  . Highest education level: Not on file  Occupational History  . Not on file  Social Needs  . Financial resource strain: Not on file  . Food insecurity:    Worry: Not on file    Inability: Not on file  . Transportation needs:    Medical: Not on file    Non-medical: Not on file  Tobacco Use  . Smoking status: Former Smoker    Packs/day: 1.00    Years: 43.00    Pack years: 43.00    Types: Cigarettes    Start date: 08/26/1966    Last attempt to quit: 08/26/2010    Years since quitting: 7.9  . Smokeless tobacco: Never Used  Substance and Sexual Activity  . Alcohol use: No    Alcohol/week: 0.0 standard drinks  . Drug use: No  . Sexual activity: Not on file  Lifestyle  . Physical activity:    Days per week: Not on file    Minutes per session: Not on file  . Stress: Not on file  Relationships  . Social connections:    Talks on phone: Not on file    Gets together: Not on file    Attends religious service: Not on file    Active member of club or organization: Not on file    Attends meetings of clubs or organizations: Not on file    Relationship status: Not on file  Other Topics Concern  . Not on file  Social History Narrative  . Not on file     Family History:  The patient's family history includes Heart attack in her sister; Heart disease in her father. There is no history of Stroke.  ROS:   Please see the history of present illness.   All other systems are reviewed and otherwise negative.    PHYSICAL EXAM:   VS:  BP 138/76   Pulse 78   Ht 5' (1.524 m)   Wt 196 lb (88.9 kg)   SpO2 91%   BMI 38.28 kg/m   BMI: Body mass index is 38.28 kg/m.  brought O2 with her but no presently wearing GEN: Well nourished, well developed obese WF, in no acute distress HEENT: normocephalic, atraumatic Neck: no JVD, carotid bruits, or masses Cardiac: RRR; no murmurs, rubs, or gallops, trace ankle edema nonpitting (may be physiologic shape), 2+ pedal pulses bilaterally Respiratory:  Diminished BS throughout but no wheezes, rales or rhonchi, normal work of breathing GI: soft, nontender, nondistended, + BS MS: no deformity or atrophy Skin: warm and dry, no rash Neuro:  Alert and Oriented x 3, Strength and sensation are intact, follows commands  Psych: euthymic mood, full affect  Wt Readings from Last 3 Encounters:  07/30/18 196 lb (88.9 kg)  05/21/18 190 lb (86.2 kg)  12/09/17 190 lb (86.2 kg)      Studies/Labs Reviewed:   EKG:  EKG was ordered today and personally reviewed by me and demonstrates NSR 76bpm, lower voltage QRS but no acute changes. QTc .  Recent Labs: 10/01/2017: BUN 17; Creatinine, Ser 0.88; Magnesium 2.2; Potassium 4.0; Sodium 141   Lipid Panel    Component Value Date/Time   CHOL 205 (H) 07/24/2016 0044   TRIG 50 07/24/2016 0044   HDL 64 07/24/2016 0044   CHOLHDL 3.2 07/24/2016 0044   VLDL 10 07/24/2016 0044   LDLCALC 131 (H) 07/24/2016 0044    Additional studies/ records that were reviewed today include: Summarized above   ASSESSMENT & PLAN:   1. Persistent AF - maintaining NSR on Tikosyn. QTC wnl. Check BMET, Mg today given Tikosyn and check CBC given apixaban. No bleeding reported at this time. 2. Chronic diastolic CHF - weight up in clinic today but she appears euvolemic on exam. Her home weights have been stable. She takes Lasix once daily and an additional 1 tablet PRN weight gain and feels she is doing well on this regimen. 3. Coronary atherosclerosis by CT - continue risk factor modification. See below re: BP. I did ask her to discuss statin titration with her PCP who manages her cholesterol, given her  atherosclerosis and need for strict lipid control. Titration may need to be deferred until after leg pain resolves so as not to confuse the picture. 4. Thoracic aortic ectasia - discussed aneurysm precautions and education.  Repeat BP by me was 127/60. We discussed episodic BP monitoring outside the office, calling us if this tends to exceed 130 systolic. Will repeat CT of aorta in 10/2018. 5. HLD - see above. 6. Right leg pain - good pulses, no slow-healing wounds, no marked asymmetric swelling or MSK abnormality. Check lytes. I asked her to touch base with primary care to further evaluate.  Disposition: F/u with Dr. [name] in 6 months.  Medication Adjustments/Labs and Tests Ordered: Current medicines are reviewed at length with the patient today.  Concerns regarding medicines are outlined above. Medication changes, Labs and Tests ordered today are summarized above and listed in the Patient Instructions accessible in Encounters.   Signed, Laurann Montana, PA-C  07/30/2018 10:53 AM    [facility] [address] [place], [place], [place]  [address] Phone: [phone]; Fax: [phone]

## 2018-07-30 ENCOUNTER — Encounter (INDEPENDENT_AMBULATORY_CARE_PROVIDER_SITE_OTHER): Payer: Self-pay

## 2018-07-30 ENCOUNTER — Encounter: Payer: Self-pay | Admitting: Physician Assistant

## 2018-07-30 ENCOUNTER — Ambulatory Visit: Payer: Medicare Other | Admitting: Physician Assistant

## 2018-07-30 ENCOUNTER — Telehealth: Payer: Self-pay | Admitting: *Deleted

## 2018-07-30 VITALS — BP 127/60 | HR 78 | Ht 60.0 in | Wt 196.0 lb

## 2018-07-30 DIAGNOSIS — I77810 Thoracic aortic ectasia: Secondary | ICD-10-CM | POA: Diagnosis not present

## 2018-07-30 DIAGNOSIS — I712 Thoracic aortic aneurysm, without rupture, unspecified: Secondary | ICD-10-CM

## 2018-07-30 DIAGNOSIS — I2510 Atherosclerotic heart disease of native coronary artery without angina pectoris: Secondary | ICD-10-CM | POA: Diagnosis not present

## 2018-07-30 DIAGNOSIS — I4819 Other persistent atrial fibrillation: Secondary | ICD-10-CM | POA: Diagnosis not present

## 2018-07-30 DIAGNOSIS — I5032 Chronic diastolic (congestive) heart failure: Secondary | ICD-10-CM | POA: Diagnosis not present

## 2018-07-30 DIAGNOSIS — E785 Hyperlipidemia, unspecified: Secondary | ICD-10-CM

## 2018-07-30 DIAGNOSIS — R252 Cramp and spasm: Secondary | ICD-10-CM

## 2018-07-30 LAB — CBC
HEMATOCRIT: 44.7 % (ref 34.0–46.6)
HEMOGLOBIN: 14.4 g/dL (ref 11.1–15.9)
MCH: 28.7 pg (ref 26.6–33.0)
MCHC: 32.2 g/dL (ref 31.5–35.7)
MCV: 89 fL (ref 79–97)
Platelets: 198 10*3/uL (ref 150–450)
RBC: 5.01 x10E6/uL (ref 3.77–5.28)
RDW: 13.9 % (ref 12.3–15.4)
WBC: 8.9 10*3/uL (ref 3.4–10.8)

## 2018-07-30 LAB — BASIC METABOLIC PANEL
BUN/Creatinine Ratio: 24 (ref 12–28)
BUN: 22 mg/dL (ref 8–27)
CALCIUM: 10.5 mg/dL — AB (ref 8.7–10.3)
CO2: 26 mmol/L (ref 20–29)
CREATININE: 0.91 mg/dL (ref 0.57–1.00)
Chloride: 100 mmol/L (ref 96–106)
GFR calc non Af Amer: 65 mL/min/{1.73_m2} (ref >59)
GFR, EST AFRICAN AMERICAN: 75 mL/min/{1.73_m2} (ref >59)
Glucose: 87 mg/dL (ref 65–99)
POTASSIUM: 4.1 mmol/L (ref 3.5–5.2)
Sodium: 143 mmol/L (ref 134–144)

## 2018-07-30 LAB — MAGNESIUM: Magnesium: 2.3 mg/dL (ref 1.6–2.3)

## 2018-07-30 NOTE — Telephone Encounter (Signed)
Called pt : lab results.  Left a message for pt to call back.

## 2018-07-30 NOTE — Telephone Encounter (Signed)
-----   Message from Laurann Montana, [place] sent at 07/30/2018  4:12 PM EST ----- Please call patient. Labs are stable except calcium level is slightly elevated. Given her complaints of leg pain I would recommend she make an appointment with PCP to address and repeat calcium level. Would avoid additional calcium sources for now in form of supplement or overdoing it in the diet. Dayna Dunn PA-C

## 2018-07-30 NOTE — Patient Instructions (Addendum)
Medication Instructions:  Your physician recommends that you continue on your current medications as directed. Please refer to the Current Medication list given to you today.  If you need a refill on your cardiac medications before your next appointment, please call your pharmacy.   Lab work: TODAY:  BMET, MAG, & CBC  IN MARCH BEFORE THE CTA:  BMET  If you have labs (blood work) drawn today and your tests are completely normal, you will receive your results only by: Marland Kitchen MyChart Message (if you have MyChart) OR . A paper copy in the mail If you have any lab test that is abnormal or we need to change your treatment, we will call you to review the results.  Testing/Procedures: Non-Cardiac CT Angiography (CTA), [place] [facility] 2020. is a special type of CT scan that uses a computer to produce multi-dimensional views of major blood vessels throughout the body. In CT angiography, a contrast material is injected through an IV to help visualize the blood vessels   Follow-Up: At [facility], you and your health needs are our priority.  As part of our continuing mission to provide you with exceptional heart care, we have created designated Provider Care Teams.  These Care Teams include your primary Cardiologist (physician) and Advanced Practice Providers (APPs -  Physician Assistants and Nurse Practitioners) who all work together to provide you with the care you need, when you need it. You will need a follow up appointment in 6 months.  Please call our office 2 months in advance to schedule this appointment.  You may see Lance Muss, MD or one of the following Advanced Practice Providers on your designated Care Team:   [place], PA-C Ronie Spies, PA-C . Jacolyn Reedy, PA-C  Any Other Special Instructions Will Be Listed Below (If Applicable).  Your CT scan in 10/2017 showed enlargement of the blood vessel called the descending thoracic aorta. This enlargement is sometimes referred to as an  aneurysm. Mainstays of therapy for aneurysms include good blood pressure control, healthy lifestyle, and avoiding fluoroquinolone antibiotic medications (such as those in the "Cipro" class, ending in "floxacin") due to risk of damage to the aorta. This is a finding I would expect to be monitored periodically by their primary cardiologist. Since aneurysms can run in families, you should discuss your diagnosis with first degree relatives as they may need to be screened for this. Regular mild-moderate physical exercise is OK but avoid heavy lifting/weight lifting over 30lbs, chopping wood, shoveling snow or digging heavy earth with a shovel.   Please talk to your primary doctor about your right leg pain, and about potential increase in your atorvastatin since you have a history of coronary calcium buildup by a prior CT. Your primary care doctor might want to sort out the leg pain, however, before increasing that medicine.   CT Angiogram A CT angiogram is a procedure to look at the blood vessels in various areas of the body. For this procedure, a large X-ray machine, called a CT scanner, takes detailed pictures of blood vessels that have been injected with a dye (contrast material). A CT angiogram allows your health care provider to see how well blood is flowing to the area of your body that is being checked. Your health care provider will be able to see if there are any problems, such as a blockage. Tell a health care provider about:  Any allergies you have.  All medicines you are taking, including vitamins, herbs, eye drops, creams, and over-the-counter  medicines.  Any problems you or family members have had with anesthetic medicines.  Any blood disorders you have.  Any surgeries you have had.  Any medical conditions you have.  Whether you are pregnant or may be pregnant.  Whether you are breastfeeding.  Any anxiety disorders, chronic pain, or other conditions you have that may increase your  stress or prevent you from lying still. What are the risks? Generally, this is a safe procedure. However, problems may occur, including:  Infection.  Bleeding.  Allergic reactions to medicines or dyes.  Damage to other structures or organs.  Kidney damage from the dye or contrast that is used.  Increased risk of cancer from radiation exposure. This risk is low. Talk with your health care provider about: ? The risks and benefits of testing. ? How you can receive the lowest dose of radiation.  What happens before the procedure?  Wear comfortable clothing and remove any jewelry.  Follow instructions from your health care provider about eating and drinking. For most people, instructions may include these actions: ? For 12 hours before the test, avoid caffeine. This includes tea, coffee, soda, and energy drinks or pills. ? For 3-4 hours before the test, stop eating or drinking anything but water. ? Stay well hydrated by continuing to drink water before the exam. This will help to clear the contrast dye from your body after the test.  Ask your health care provider about changing or stopping your regular medicines. This is especially important if you are taking diabetes medicines or blood thinners. What happens during the procedure?  An IV tube will be inserted into one of your veins.  You will be asked to lie on an exam table. This table will slide in and out of the CT machine during the procedure.  Contrast dye will be injected into the IV tube. You might feel warm, or you may get a metallic taste in your mouth.  The table that you are lying on will move into the CT machine tunnel for the scan.  The person running the machine will give you instructions while the scans are being done. You may be asked to: ? Keep your arms above your head. ? Hold your breath. ? Stay very still, even if the table is moving.  When the scanning is complete, you will be moved out of the  machine.  The IV tube will be removed. The procedure may vary among health care providers and hospitals. What happens after the procedure?  You might feel warm, or you may get a metallic taste in your mouth.  You may be asked to drink water or other fluids to wash (flush) the contrast material out of your body.  It is up to you to get the results of your procedure. Ask your health care provider, or the department that is doing the procedure, when your results will be ready. Summary  A CT angiogram is a procedure to look at the blood vessels in various areas of the body.  You will need to stay very still during the exam.  You may be asked to drink water or other fluids to wash (flush) the contrast material out of your body after your scan. This information is not intended to replace advice given to you by your health care provider. Make sure you discuss any questions you have with your health care provider. Document Released: 04/11/2016 Document Revised: 04/11/2016 Document Reviewed: 04/11/2016 Elsevier Interactive Patient Education  [employer].

## 2018-08-31 ENCOUNTER — Other Ambulatory Visit (HOSPITAL_COMMUNITY): Payer: Self-pay | Admitting: *Deleted

## 2018-08-31 MED ORDER — DOFETILIDE 500 MCG PO CAPS: ORAL_CAPSULE | ORAL | 3 refills | Status: DC

## 2018-10-19 ENCOUNTER — Other Ambulatory Visit: Payer: Medicare Other

## 2018-10-19 ENCOUNTER — Telehealth: Payer: Self-pay | Admitting: Interventional Cardiology

## 2018-10-19 ENCOUNTER — Encounter (INDEPENDENT_AMBULATORY_CARE_PROVIDER_SITE_OTHER): Payer: Self-pay

## 2018-10-19 DIAGNOSIS — I712 Thoracic aortic aneurysm, without rupture, unspecified: Secondary | ICD-10-CM

## 2018-10-19 NOTE — Telephone Encounter (Signed)
Left message for patient to call back  

## 2018-10-19 NOTE — Telephone Encounter (Signed)
Patient call back and wanted to know what type of diet she should be following. Reviewed Heart Healthy Diet with the patient and made her aware that whatever diet that she chooses should align with this one. Patient verbalized understanding and thanked me for the call.

## 2018-10-19 NOTE — Telephone Encounter (Signed)
Patient would like for RN to call her regarding a diet she would like to begin.  She just wants to make sure it is okay with Dr. [name].

## 2018-10-20 ENCOUNTER — Telehealth: Payer: Self-pay | Admitting: *Deleted

## 2018-10-20 DIAGNOSIS — Z79899 Other long term (current) drug therapy: Secondary | ICD-10-CM

## 2018-10-20 LAB — BASIC METABOLIC PANEL
BUN/Creatinine Ratio: 11 — ABNORMAL LOW (ref 12–28)
BUN: 14 mg/dL (ref 8–27)
CO2: 27 mmol/L (ref 20–29)
Calcium: 10.2 mg/dL (ref 8.7–10.3)
Chloride: 101 mmol/L (ref 96–106)
Creatinine, Ser: 1.26 mg/dL — ABNORMAL HIGH (ref 0.57–1.00)
GFR calc Af Amer: 51 mL/min/{1.73_m2} — ABNORMAL LOW (ref >59)
GFR calc non Af Amer: 44 mL/min/{1.73_m2} — ABNORMAL LOW (ref >59)
Glucose: 133 mg/dL — ABNORMAL HIGH (ref 65–99)
Potassium: 4.8 mmol/L (ref 3.5–5.2)
Sodium: 145 mmol/L — ABNORMAL HIGH (ref 134–144)

## 2018-10-20 NOTE — Telephone Encounter (Signed)
Pt made aware of her lab results and will come back in office 10/21/2018 for stat repeat bmet.

## 2018-10-20 NOTE — Telephone Encounter (Signed)
-----   Message from Laurann Montana, [place] sent at 10/20/2018  8:35 AM EST ----- BMET was rechecked to make sure calcium normalized. Oddly enough please let patient know labs show a bump in her kidney function and a slight elevation in sodium level which suggests she's a little dry. Has she been taking any extra Lasix more than usual? If so I would just stick to once daily as previously recommended. Try to drink a little extra water over the next few days but not go to extremes. IF she hasn't taken any extra Lasix, I would hold one day's dose and then resume the next day. Calcium level has normalized. Needs recheck of kidney function on Thurs or Friday AM (run stat) to guide whether Tikosyn dose needs to be adjusted as she is right on the cusp of where her dose would need adjustment. Also caution to avoid any NSAIDS (motrin, ibuprofen, advil, aleve, naproxen, etc).   Dayna Dunn PA-C

## 2018-10-21 ENCOUNTER — Other Ambulatory Visit: Payer: Medicare Other | Admitting: *Deleted

## 2018-10-21 DIAGNOSIS — Z79899 Other long term (current) drug therapy: Secondary | ICD-10-CM

## 2018-10-21 LAB — BASIC METABOLIC PANEL
BUN/Creatinine Ratio: 18 (ref 12–28)
BUN: 16 mg/dL (ref 8–27)
CO2: 29 mmol/L (ref 20–29)
Calcium: 10.1 mg/dL (ref 8.7–10.3)
Chloride: 103 mmol/L (ref 96–106)
Creatinine, Ser: 0.89 mg/dL (ref 0.57–1.00)
GFR calc Af Amer: 77 mL/min/{1.73_m2} (ref >59)
GFR calc non Af Amer: 67 mL/min/{1.73_m2} (ref >59)
GLUCOSE: 91 mg/dL (ref 65–99)
Potassium: 4.3 mmol/L (ref 3.5–5.2)
Sodium: 139 mmol/L (ref 134–144)

## 2018-10-26 ENCOUNTER — Ambulatory Visit (INDEPENDENT_AMBULATORY_CARE_PROVIDER_SITE_OTHER)
Admission: RE | Admit: 2018-10-26 | Discharge: 2018-10-26 | Disposition: A | Discharge status: Home / Self Care | Payer: Medicare Other | Source: Ambulatory Visit | Attending: Physician Assistant | Admitting: Physician Assistant

## 2018-10-26 DIAGNOSIS — I712 Thoracic aortic aneurysm, without rupture, unspecified: Secondary | ICD-10-CM

## 2018-10-26 MED ORDER — IOPAMIDOL (ISOVUE-370) INJECTION 76%
100.0000 mL | Freq: Once | INTRAVENOUS | Status: AC | PRN
  Administered 2018-10-26: 100 mL via INTRAVENOUS

## 2018-10-27 ENCOUNTER — Telehealth: Payer: Self-pay | Admitting: *Deleted

## 2018-10-27 DIAGNOSIS — I77810 Thoracic aortic ectasia: Secondary | ICD-10-CM

## 2018-10-27 DIAGNOSIS — I712 Thoracic aortic aneurysm, without rupture, unspecified: Secondary | ICD-10-CM

## 2018-10-27 NOTE — Telephone Encounter (Signed)
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 At this point it would be helpful to have followup with cardiothoracic surgery to review timing of follow-up - please place referral. This does not necessarily mean she will require surgery in the immediate future, but helpful to have their eye on images to help guide how best to manage this.  Other findings are similar to prior.  Also of note - upon review of chart, last OV 09/2018 I recommended f/u with Dr. [name] in 6 months. I looked to double check when next Afib clinic appt is and it does not appear she has one. Pts on Tikosyn should generally follow every 3 months - can you get her into afib clinic this month? Has seen Lupita Leash before so just needs routine f/u of EKG, Mg, BMET per guidelines.  Dayna Dunn PA-C

## 2018-10-29 IMAGING — CT CT CHEST HIGH RESOLUTION W/O CM
2 of 5 series · 14 of 36 positions shown, 17 images · non-contrast
Comparison: 11/07/2016 high-resolution chest CT.

CLINICAL DATA: Follow-up interstitial lung disease.

EXAM:
CT CHEST WITHOUT CONTRAST
TECHNIQUE: Multidetector CT imaging of the chest was performed following the
standard protocol without intravenous contrast. High resolution
imaging of the lungs, as well as inspiratory and expiratory imaging,
was performed.

[Series 2: high resolution · axial · 0.60mm/px · z∈[-302,-50]mm · 11 of 140 slices shown, 14 images]
[im 7/140  mediastinal]
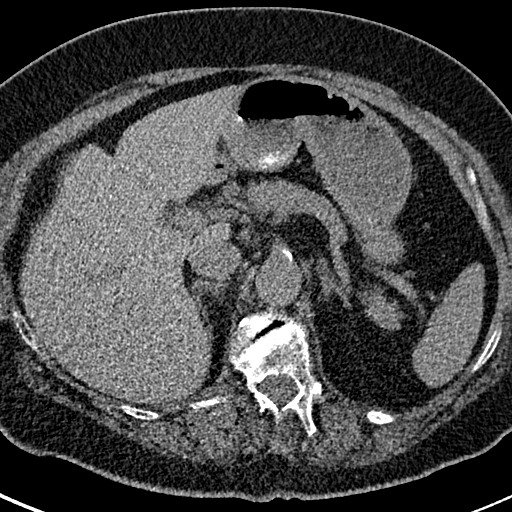
[im 7/140  lung]
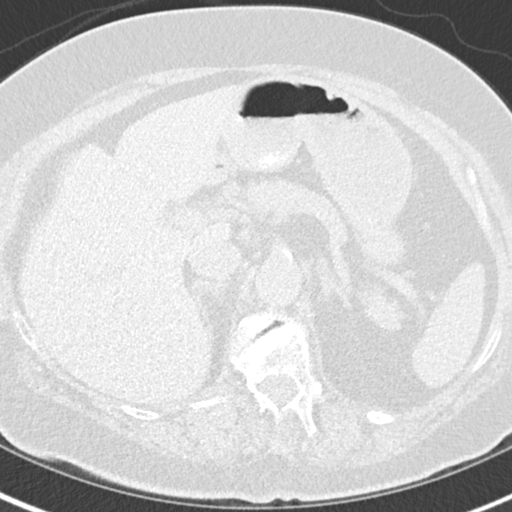
[im 19/140  lung]
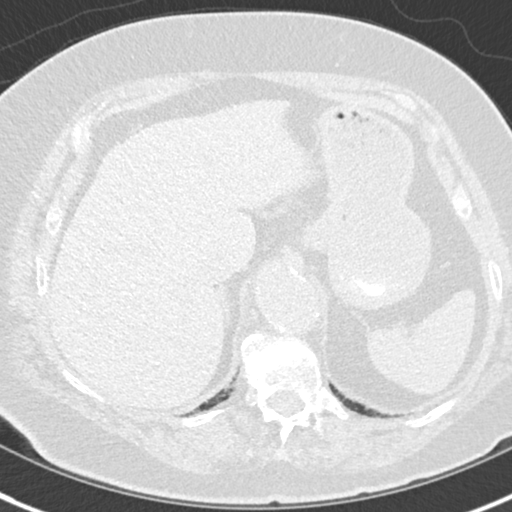
[im 32/140  lung]
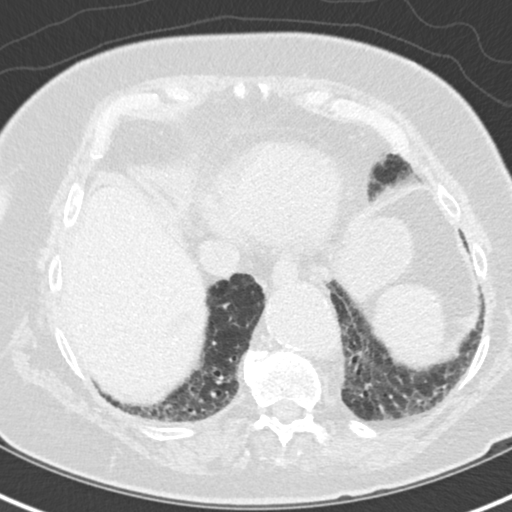
[im 45/140  lung]
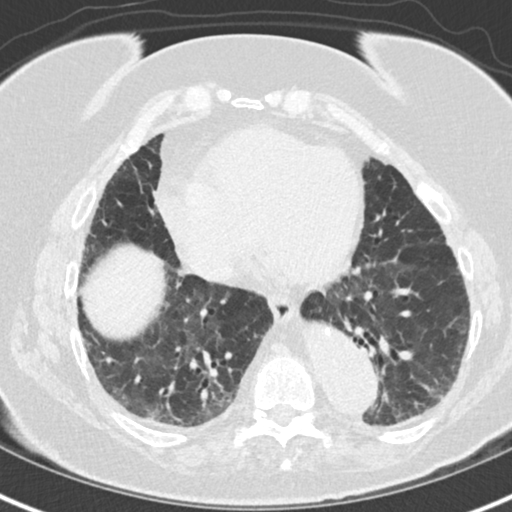
[im 57/140  mediastinal]
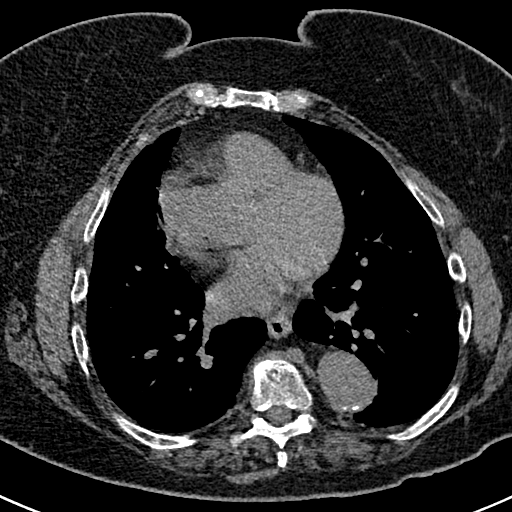
[im 57/140  lung]
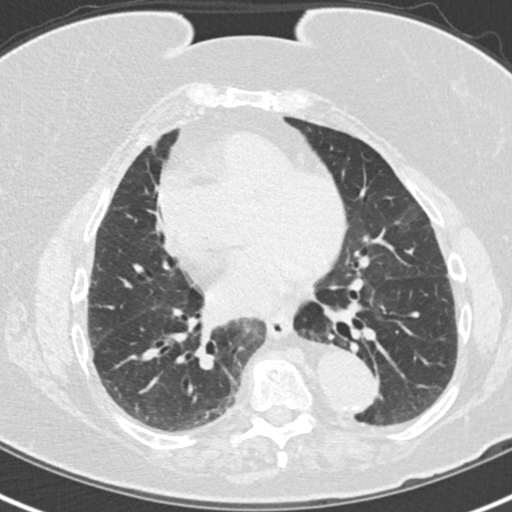
[im 70/140  lung]
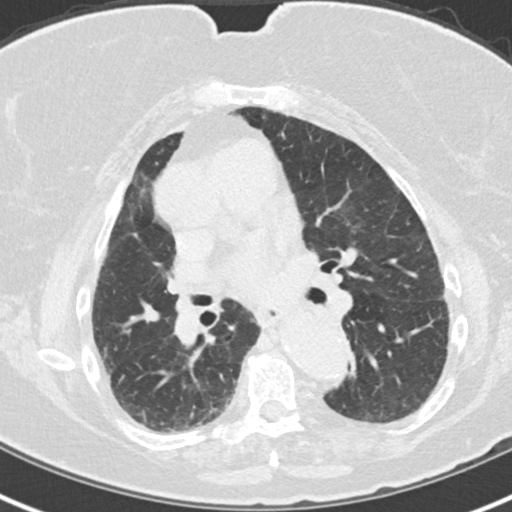
[im 83/140  lung]
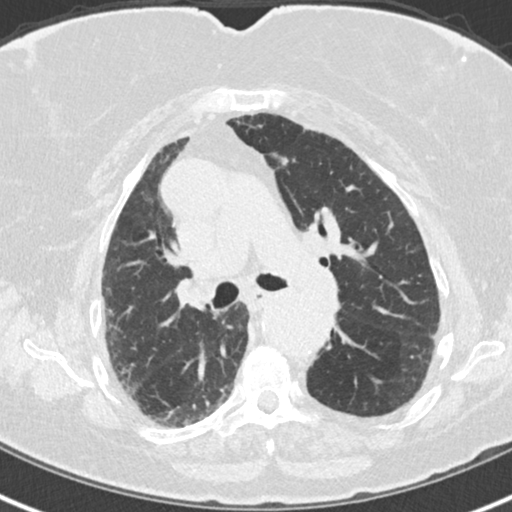
[im 95/140  lung]
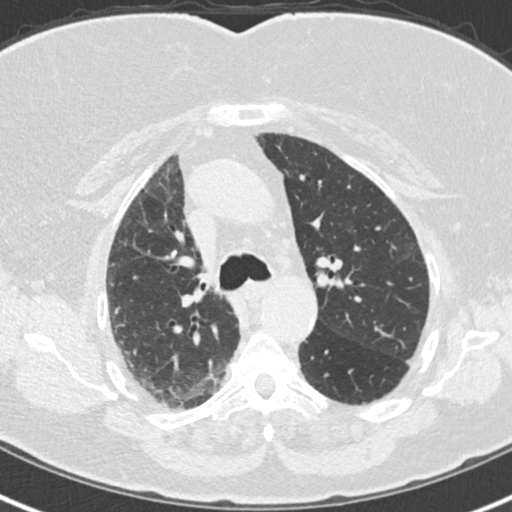
[im 108/140  mediastinal]
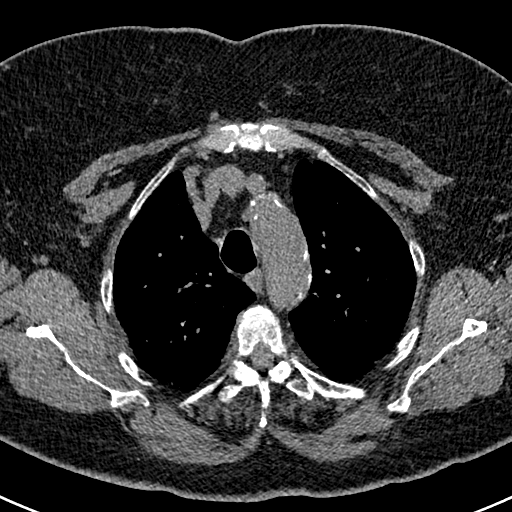
[im 108/140  lung]
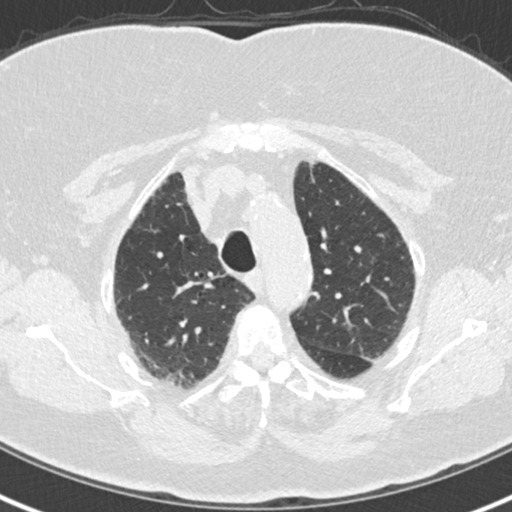
[im 121/140  lung]
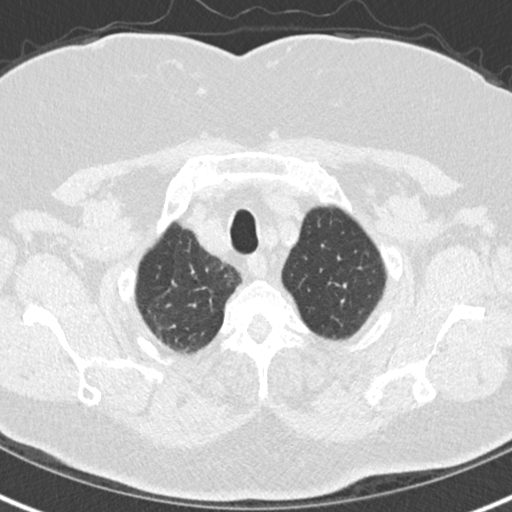
[im 133/140  lung]
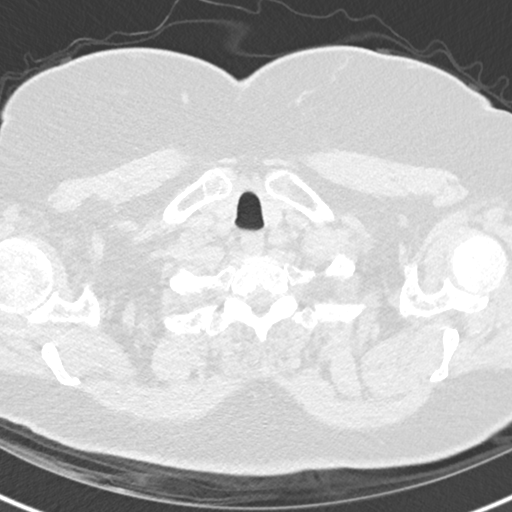

[Series 8: coronal · coronal · 0.55mm/px · 3 of 127 slices shown]
[im 26/127  lung]
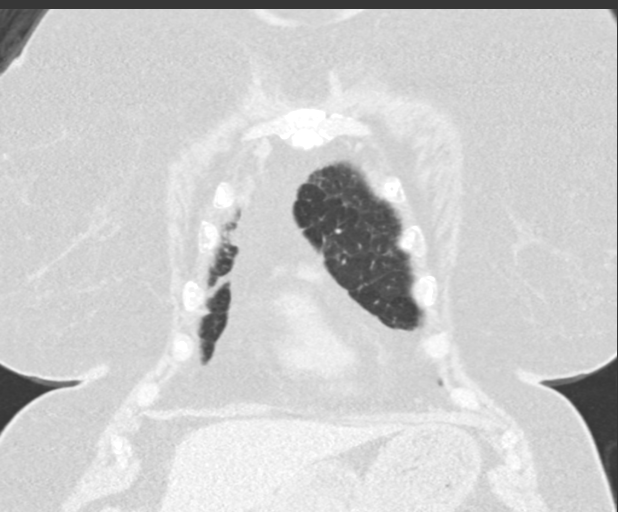
[im 51/127  lung]
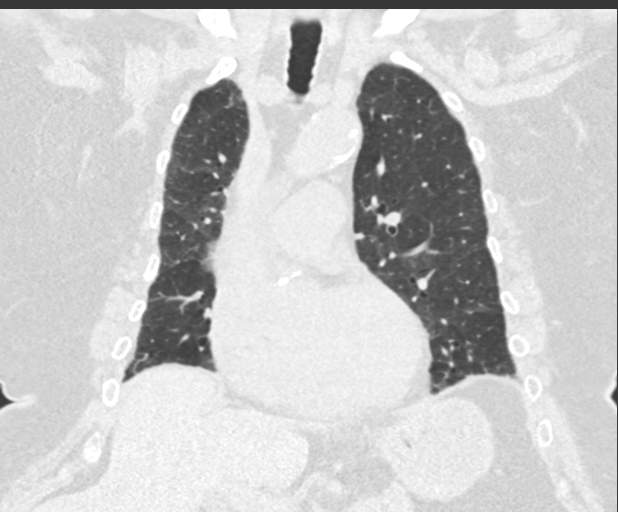
[im 76/127  lung]
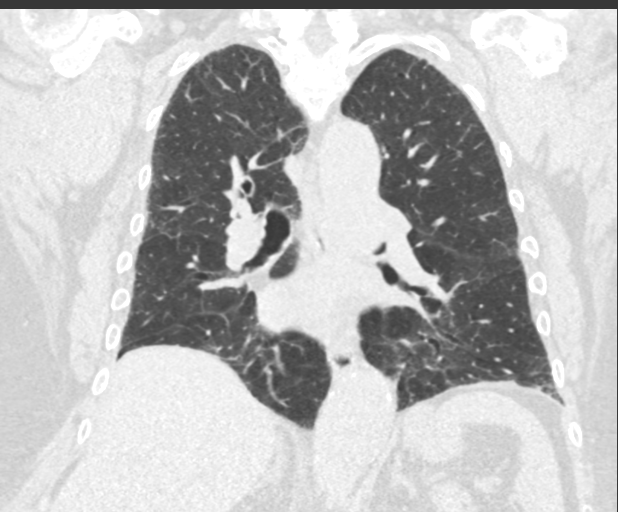

[14 of 36 positions shown; findings below may reference images not displayed]

FINDINGS: Cardiovascular: Normal heart size. No significant pericardial
fluid/thickening. Left anterior descending and left circumflex
coronary atherosclerosis. Tortuous atherosclerotic thoracic aorta
with fusiform ectasia of the descending thoracic aorta, maximum
diameter 4.0 cm, stable. Stable borderline prominent main pulmonary
artery (3.3 cm diameter).

Mediastinum/Nodes: No discrete thyroid nodules. Unremarkable
esophagus. No pathologically enlarged axillary, mediastinal or gross
hilar lymph nodes, noting limited sensitivity for the detection of
hilar adenopathy on this noncontrast study.

Lungs/Pleura: No pneumothorax. No pleural effusion. No acute
consolidative airspace disease or lung masses. Subpleural 4 mm solid
anterior left lower lobe pulmonary nodules (series 3/image 66) are
stable since 11/07/2016 and considered benign. No new significant
pulmonary nodules. Patchy confluent subpleural reticulation and
ground-glass attenuation throughout both lungs with associated mild
traction bronchiectasis and mild architectural distortion. No
interval progression of these findings. No frank honeycombing. No
significant air trapping on the expiration sequence.

Upper abdomen: No acute abnormality.

Musculoskeletal: No aggressive appearing focal osseous lesions.
Healed deformities are noted in multiple anterior right ribs. Marked
thoracic spondylosis.
IMPRESSION: 1. Interval stability of basilar predominant fibrotic interstitial
lung disease without frank honeycombing. Findings are most
compatible with fibrotic phase nonspecific interstitial pneumonia
(NSIP).
2. Fusiform ectasia of the descending thoracic aorta, maximum
diameter 4.0 cm, stable. Recommend annual imaging followup by CTA or
MRA. This recommendation follows 0393
ACCF/AHA/AATS/ACR/ASA/SCA/ASIEL/MONGE/DATABEX/NICASIO Guidelines for the
Diagnosis and Management of Patients with Thoracic Aortic Disease.
Circulation. 0393; 121: [id number].
3. Two vessel coronary atherosclerosis.

Aortic Atherosclerosis (7KCLP-TGS.S).

## 2018-11-10 ENCOUNTER — Ambulatory Visit (HOSPITAL_COMMUNITY): Payer: Medicare Other | Admitting: Nurse Practitioner

## 2018-11-19 ENCOUNTER — Ambulatory Visit: Payer: Medicare Other | Admitting: Internal Medicine

## 2018-11-24 ENCOUNTER — Encounter: Payer: Medicare Other | Admitting: Thoracic Surgery (Cardiothoracic Vascular Surgery)

## 2018-12-15 ENCOUNTER — Encounter: Payer: Medicare Other | Admitting: Thoracic Surgery (Cardiothoracic Vascular Surgery)

## 2018-12-23 ENCOUNTER — Other Ambulatory Visit: Payer: Self-pay

## 2018-12-23 ENCOUNTER — Encounter (HOSPITAL_COMMUNITY): Payer: Self-pay | Admitting: Nurse Practitioner

## 2018-12-23 ENCOUNTER — Ambulatory Visit (HOSPITAL_COMMUNITY)
Admission: RE | Admit: 2018-12-23 | Discharge: 2018-12-23 | Disposition: A | Discharge status: Home / Self Care | Payer: Medicare Other | Source: Ambulatory Visit | Attending: Nurse Practitioner | Admitting: Nurse Practitioner

## 2018-12-23 VITALS — BP 139/86 | HR 80 | Ht 60.0 in | Wt 192.0 lb

## 2018-12-23 DIAGNOSIS — I4819 Other persistent atrial fibrillation: Secondary | ICD-10-CM

## 2018-12-23 NOTE — Progress Notes (Signed)
Electrophysiology TeleHealth Note   Due to national recommendations of social distancing due to COVID 19, Audio/video telehealth visit is felt to be most appropriate for this patient at this time.  See MyChart message/consent below from today for patient consent regarding telehealth for the [facility].    Date:  12/23/2018   ID:  [name], DOB [birth year], MRN [id number]  Location: home  Provider location: [address] [place], [place] [address] Evaluation Performed:  Follow up   PCP:  [name], MD  Primary Cardiologist:  Dr. [name] Primary Electrophysiologist: Dr. [name]   CC: afib/tikosyn surveillance   History of Present Illness: [name] is a 68 y.o. female who presents via audio/video conferencing for a telehealth visit today.   Pt reports that she is doing well without any afib and remains compliant with Tikosyn and  Eliquis. She had death of her nephew( not covid) yesterday and is dealing with that but so far staying in rhythm.   Today, she denies symptoms of palpitations, chest pain, shortness of breath, orthopnea, PND, lower extremity edema, claudication, dizziness, presyncope, syncope, bleeding, or neurologic sequela. The patient is tolerating medications without difficulties and is otherwise without complaint today.   she denies symptoms of cough, fevers, chills, or new SOB worrisome for COVID 19.    Atrial Fibrillation Risk Factors:  she does have symptoms or diagnosis of sleep apnea. she is compliant with BiPAPtherapy. she does not have a history of rheumatic fever. she does not have a history of alcohol use. The patient does not have a history of early familial atrial fibrillation or other arrhythmias.  she has a BMI of There is no height or weight on file to calculate BMI.. There were no vitals filed for this visit.  Past Medical History:  Diagnosis Date  . Chronic diastolic CHF (congestive heart failure) (HCC)   .  Chronic respiratory failure (HCC)   . Coronary atherosclerosis    a. 2V by CT 10/2017.  . Emphysema of lung (HCC)   . Hypercholesterolemia   . Hypertension   . NSIP (nonspecific interstitial pneumonia) (HCC)   . Osteoarthritis   . Persistent atrial fibrillation   . Thoracic aortic ectasia [facility])    Past Surgical History:  Procedure Laterality Date  . CARDIOVERSION N/A 07/25/2016   Procedure: CARDIOVERSION;  Surgeon: Pricilla Riffle, MD;  Location: [facility] ENDOSCOPY;  Service: Cardiovascular;  Laterality: N/A;  . CARDIOVERSION N/A 09/14/2016   Procedure: CARDIOVERSION;  Surgeon: Hillis Range, MD;  Location: [facility] OR;  Service: Cardiovascular;  Laterality: N/A;  . CATARACT EXTRACTION Bilateral   . LEG SURGERY Right   . TEE WITHOUT CARDIOVERSION N/A 07/25/2016   Procedure: TRANSESOPHAGEAL ECHOCARDIOGRAM (TEE);  Surgeon: Pricilla Riffle, MD;  Location: [facility] ENDOSCOPY;  Service: Cardiovascular;  Laterality: N/A;     Current Outpatient Medications  Medication Sig Dispense Refill  . acetaminophen (TYLENOL) 500 MG tablet Take 500 mg by mouth every 6 (six) hours as needed for moderate pain.    Marland Kitchen apixaban (ELIQUIS) 5 MG TABS tablet Take 1 tablet (5 mg total) by mouth 2 (two) times daily. 60 tablet 0  . atorvastatin (LIPITOR) 10 MG tablet Take 1 tablet (10 mg total) by mouth daily at 6 PM. 30 tablet 0  . brimonidine (ALPHAGAN P) 0.1 % SOLN as directed.    . diltiazem (CARDIZEM CD) 240 MG 24 hr capsule TAKE ONE CAPSULE BY MOUTH DAILY 90 capsule 2  . dofetilide (TIKOSYN) 500 MCG  capsule TAKE 1 CAPSULE(500 MCG) BY MOUTH TWICE DAILY 60 capsule 3  . furosemide (LASIX) 40 MG tablet TAKE 1 TABLET(40 MG) BY MOUTH DAILY 90 tablet 3  . HYDROcodone-acetaminophen (NORCO/VICODIN) 5-325 MG tablet Take 1 tablet by mouth every 6 (six) hours as needed for moderate pain.    Marland Kitchen latanoprost (XALATAN) 0.005 % ophthalmic solution Place 1 drop into both eyes at bedtime.    Marland Kitchen LORazepam (ATIVAN) 1 MG tablet TAKE 1 TABLET BY MOUTH AS  NEEDED FOR ANXIETY  1  . Multiple Vitamin (MULTIVITAMIN) capsule Take 1 capsule by mouth daily.    . OXYGEN 2lpm 24/7 and 4 lpm with exertion [facility]    . UNABLE TO FIND BIPAP with 2lpm o2 at night  [facility]     No current facility-administered medications for this encounter.     Allergies:   Levaquin [levofloxacin in d5w] and Percocet [oxycodone-acetaminophen]   Social History:  The patient  reports that she quit smoking about 8 years ago. Her smoking use included cigarettes. She started smoking about 52 years ago. She has a 43.00 pack-year smoking history. She has never used smokeless tobacco. She reports that she does not drink alcohol or use drugs.   Family History:  The patient's  family history includes Heart attack in her sister; Heart disease in her father.    ROS:  Please see the history of present illness.   All other systems are personally reviewed and negative.   Exam: Well appearing, alert and conversant, regular work of breathing,  good skin color    Recent Labs: 07/30/2018: Hemoglobin 14.4; Magnesium 2.3; Platelets 198 10/21/2018: BUN 16; Creatinine, Ser 0.89; Potassium 4.3; Sodium 139  personally reviewed    Other studies personally reviewed: Epic notes/labs     ASSESSMENT AND PLAN:  1.  H/o persisitent atrial fibrillation Doing well maintaining SR on Tikosyn 500 mcg bid Continue Cardizem 240 mg a day Continue  eliquis 5 mg bid   This patients CHA2DS2-VASc Score and unadjusted Ischemic Stroke Rate (% per year) is equal to 3.2 % stroke rate/year from a score of 3  Above score calculated as 1 point each if present [CHF, HTN, DM, Vascular=MI/PAD/Aortic Plaque, Age if 65-74, or Female] Above score calculated as 2 points each if present [Age > 75, or Stroke/TIA/TE]    COVID screen The patient does not have any symptoms that suggest any further testing/ screening at this time.  Social distancing reinforced today.    Follow-up:   Will f/u with tikosyn  labs and ekg  when covid 19 restrictions relaxed Otherwise, f/u with Dr. [name] in June as scheduled  Current medicines are reviewed at length with the patient today.   The patient does not have concerns regarding her medicines.  The following changes were made today:  none  Labs/ tests ordered today include: none No orders of the defined types were placed in this encounter.   Patient Risk:  after full review of this patients clinical status, I feel that they are at hgh risk at this time.   Today, I have spent 12 minutes with the patient with afib/tikosyn  Signed, [name] NP 12/23/2018 12:48 PM  Afib Clinic [facility] [address] [place], [place] [address] [phone]    hereby voluntarily request, consent and authorize the Atrial Fibrillation Clinic and its employed or contracted physicians, physician assistants, nurse practitioners or other licensed health care professionals (the Practitioner), to provide me with telemedicine health care services (the "Services") as  deemed necessary by the treating Practitioner. I acknowledge and consent to receive the Services by the Practitioner via telemedicine. I understand that the telemedicine visit will involve communicating with the Practitioner through live audiovisual communication technology and the disclosure of certain medical information by electronic transmission. I acknowledge that I have been given the opportunity to request an in-person assessment or other available alternative prior to the telemedicine visit and am voluntarily participating in the telemedicine visit.   I understand that I have the right to withhold or withdraw my consent to the use of telemedicine in the course of my care at any time, without affecting my right to future care or treatment, and that the Practitioner or I may terminate the telemedicine visit at any time. I understand that I have the right to inspect all information obtained and/or recorded in  the course of the telemedicine visit and may receive copies of available information for a reasonable fee.  I understand that some of the potential risks of receiving the Services via telemedicine include:   Delay or interruption in medical evaluation due to technological equipment failure or disruption;  Information transmitted may not be sufficient (e.g. poor resolution of images) to allow for appropriate medical decision making by the Practitioner; and/or  In rare instances, security protocols could fail, causing a breach of personal health information.   Furthermore, I acknowledge that it is my responsibility to provide information about my medical history, conditions and care that is complete and accurate to the best of my ability. I acknowledge that Practitioner's advice, recommendations, and/or decision may be based on factors not within their control, such as incomplete or inaccurate data provided by me or distortions of diagnostic images or specimens that may result from electronic transmissions. I understand that the practice of medicine is not an exact science and that Practitioner makes no warranties or guarantees regarding treatment outcomes. I acknowledge that I will receive a copy of this consent concurrently upon execution via email to the email address I last provided but may also request a printed copy by calling the office of the At[facility] Fibrillation Clinic.  I understand that my insurance will be billed for this visit.   I have read or had this consent read to me.  I understand the contents of this consent, which adequately explains the benefits and risks of the Services being provided via telemedicine.  I have been provided ample opportunity to ask questions regarding this consent and the Services and have had my questions answered to my satisfaction.  I give my informed consent for the services to be provided through the use of telemedicine in my medical care  By participating in  this telemedicine visit I agree to the above.

## 2018-12-29 ENCOUNTER — Other Ambulatory Visit (HOSPITAL_COMMUNITY): Payer: Self-pay | Admitting: Nurse Practitioner

## 2019-01-06 ENCOUNTER — Ambulatory Visit: Payer: Medicare Other | Admitting: Internal Medicine

## 2019-01-06 ENCOUNTER — Encounter: Payer: Self-pay | Admitting: Internal Medicine

## 2019-01-06 ENCOUNTER — Other Ambulatory Visit: Payer: Self-pay

## 2019-01-06 VITALS — BP 132/80 | HR 83 | Temp 97.8°F | Ht 60.0 in | Wt 193.6 lb

## 2019-01-06 DIAGNOSIS — J9612 Chronic respiratory failure with hypercapnia: Secondary | ICD-10-CM

## 2019-01-06 DIAGNOSIS — J9611 Chronic respiratory failure with hypoxia: Secondary | ICD-10-CM | POA: Diagnosis not present

## 2019-01-06 DIAGNOSIS — J8410 Pulmonary fibrosis, unspecified: Secondary | ICD-10-CM | POA: Diagnosis not present

## 2019-01-06 NOTE — Patient Instructions (Signed)
No change in recommendations  Please schedule a follow up visit in 12 months but call sooner if needed  

## 2019-01-06 NOTE — Progress Notes (Signed)
Subjective:     Patient ID: [name], female   DOB: 11-Apr-1951,    MRN: [id number]    Brief patient profile:  50 yowf quit smoking 2012 due to some cough/wheezing improved p quit at wt 180 and no obst on spirometry 06/25/16  referred to pulmonary clinic 06/25/2016 by Dr   [name] s/p admit:   Admit date: 02/05/2016 Discharge date: 02/08/2016  Discharge Diagnoses:  Principal Problem:   Acute respiratory failure with hypoxia ([place]) Active Problems:   HTN (hypertension)   PNA (pneumonia)   Glaucoma      History of Present Illness  06/25/2016 1st [facility] office visit/ [name]   ? NSIP  Chief Complaint  Patient presents with  . Consult visit    COPD consult per [name]- Pt states SOB w/ excertion. No congestion or tightness. Occasional coughing at times.   onset of sob spring 2017 x one month prior to admit on 02/05/16 gradually worse while on lisinopril  In April 2017 was using [facility] parking but still able to do [employer]  Now sob across the room  Sob occurs at rest if speaking  Able to lie down rotated on to left side otherwise chokes/ smothers rec Stop lotensin and start ibesartan 150 mg one daily  And your breathing should gradually improve  Please schedule a follow up office visit in 2 weeks, sooner if needed to see [name] to recheck your blood pressure  Add double the lasix dose for now    10/29/2016 acute extended ov/[name] re: new onset resp failure/ pt maint on eliquis but no rsp rx  Chief Complaint  Patient presents with  . Acute Visit    Pt c/o increased SOB and lower o2 sats for the past wk.   gradually worse sob x one week assoc with low 02 sats though baseline doe = MMRC3 = can't walk 100 yards even at a slow pace at a flat grade s stopping due to sob   No sign cough/ no chills no cp or vomiting  Leg swelling no worse than usual  rec Please see patient coordinator before you leave today  to schedule start 02 24/7 at 2lpm and ambulatory 02  titration to see if eligible for POC  Wear 02 2lpm 24/7 for now at 2lpm and increase to 4lpm with exertion Use your incentive spirometer as much as you can  Please remember to go to the lab and x-ray department downstairs in the basement  for your tests - we will call you with the results when they are available. I will arrange follow up for you here if indicated, otherwise just return to cardiology as planned  Late add:  Needs hrct next p taking one extra lasix daily x one week > NSIP         03/06/2017  f/u ov/[name] re: NSIP/MO 02 dep 2lpm at rest/sleep and 4lpm poc with acitivity Chief Complaint  Patient presents with  . Follow-up    PFT's done today. Breathing is unchanged. She has not had to use her rescue inhaler.    no rx x for 02 at this point / main concern is gradually worsening dep edema since last ov refractory to lasix 80 mg daily  rec Stop potassium and start aldactone (spironolactone) 25 mg twice daily with lasix       11/18/2017  f/u ov/[name] re:  ?NSIP last bird exp 2-3 years  02 2lpm bipap and 2lpm with activity  Chief  Complaint  Patient presents with  . Follow-up    PFT's done today. Breathing is unchanged.   Dyspnea:  Still shopping at [employer]/ wearing 2lpm with activity  Cough: none Sleep: sleep ok on cpap/ 02 SABA use:  n/a [name]  Now following for Cards rec Avoid bird exposure  I will let [name] know about your Aortic enlargement but the main treatment is to lose weight and keep your blood pressure down     05/21/2018  f/u ov/[name] re:  ? 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 Followup    Breathing is doing well and no new co's.   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 Follow-up     Breathing is unchanged since the last visit. No new co's.   Dyspnea:  10-15 min walking s stopping on on 3lpm with sats still lower 90s Cough: minimal Sleeping: bipap / flat on back 1 pillow  SABA use: none  02: 2lpm   No obvious day to day or daytime variability or assoc excess/ purulent sputum or mucus plugs or hemoptysis or cp or chest tightness, subjective wheeze or overt sinus or hb symptoms.   Sleeping as above  without nocturnal  or early am exacerbation  of respiratory  c/o's or need for noct saba. Also denies any obvious fluctuation of symptoms with weather or environmental changes or other aggravating or alleviating factors except as outlined above   No unusual exposure hx or h/o childhood pna/ asthma or knowledge of premature birth.  Current Allergies, Complete Past Medical History, Past Surgical History, Family History, and Social History were reviewed in [employer] record.  ROS  The following are not active complaints unless bolded Hoarseness, sore throat, dysphagia, dental problems, itching, sneezing,  nasal congestion or discharge of excess mucus or purulent secretions, ear ache,   fever, chills, sweats, unintended wt loss or wt gain, classically pleuritic or exertional cp,  orthopnea pnd or arm/hand swelling  or leg swelling, presyncope, palpitations, abdominal pain, anorexia, nausea, vomiting, diarrhea  or change in bowel habits or change in bladder habits, change in stools or change in urine, dysuria, hematuria,  rash, arthralgias, visual complaints, headache, numbness, weakness or ataxia or problems with walking or coordination,  change in mood or  memory.        Current Meds  Medication Sig  . acetaminophen (TYLENOL) 500 MG tablet Take 500 mg by mouth every 6 (six) hours as needed for moderate pain.  Marland Kitchen apixaban (ELIQUIS) 5 MG TABS tablet Take 1 tablet (5 mg total) by mouth 2 (two) times daily.  Marland Kitchen atorvastatin (LIPITOR) 10 MG tablet Take 1 tablet  (10 mg total) by mouth daily at 6 PM.  . brimonidine (ALPHAGAN P) 0.1 % SOLN as directed.  . diltiazem (CARDIZEM CD) 240 MG 24 hr capsule TAKE ONE CAPSULE BY MOUTH DAILY  . dofetilide (TIKOSYN) 500 MCG capsule TAKE ONE CAPSULE BY MOUTH TWICE DAILY  . furosemide (LASIX) 40 MG tablet TAKE 1 TABLET(40 MG) BY MOUTH DAILY  . HYDROcodone-acetaminophen (NORCO/VICODIN) 5-325 MG tablet Take 1 tablet by mouth every 6 (six)  hours as needed for moderate pain.  Marland Kitchen latanoprost (XALATAN) 0.005 % ophthalmic solution Place 1 drop into both eyes at bedtime.  Marland Kitchen LORazepam (ATIVAN) 1 MG tablet TAKE 1 TABLET BY MOUTH AS NEEDED FOR ANXIETY  . Multiple Vitamin (MULTIVITAMIN) capsule Take 1 capsule by mouth daily.  . OXYGEN 2lpm 24/7 and 4 lpm with exertion [facility]  . UNABLE TO FIND BIPAP with 2lpm o2 at night  [facility]                   Objective:   Physical Exam    Obese wf    01/06/2019         193  05/21/2018         190  11/18/2017         198  06/06/2017       200  .03/06/2017        204  12/05/2016         203   07/23/2016     208   06/25/16 204 lb 1.6 oz (92.6 kg)  02/06/16 204 lb 9.4 oz (92.8 kg)  02/25/08 163 lb (73.9 kg)     Vital signs reviewed - Note on arrival 02 sats  95% on 2lpm pulsed       HEENT: nl dentition, turbinates bilaterally, and oropharynx. Nl external ear canals without cough reflex   NECK :  without JVD/Nodes/TM/ nl carotid upstrokes bilaterally   LUNGS: no acc muscle use,  Nl contour chest with minimal insp crackles  bilaterally without cough on insp or exp maneuvers   CV:  RRR  no s3 or murmur or increase in P2, and trace bilateral pedal edema   ABD:  soft and nontender with nl inspiratory excursion in the supine position. No bruits or organomegaly appreciated, bowel sounds nl  MS:  Nl gait/ ext warm without deformities, calf tenderness, cyanosis or clubbing No obvious joint restrictions   SKIN: warm and dry without lesions    NEURO:  alert, approp, nl sensorium  with  no motor or cerebellar deficits apparent.             I personally reviewed images and agree with radiology impression as follows:   Chest CTa 10/26/2018 grossly unchanged subpleural reticular opacities, asymmetrically involving the right lung to a greater extent than the left. No discrete areas of honeycombing. No focal airspace opacities. There is unchanged minimal amount of pleuroparenchymal thickening about the peripheral aspect of the right major fissure. No pleural effusion or pneumothorax. The central pulmonary airways appear widely patent. No discrete pulmonary nodules.         Assessment:

## 2019-01-10 ENCOUNTER — Encounter: Payer: Self-pay | Admitting: Internal Medicine

## 2019-01-10 NOTE — Assessment & Plan Note (Signed)
HCO3  10/29/2016  = 32   Qualified for 02  10/29/16  Saturations on Room Air at Rest =87% - Patient Saturations on 2lpm o2= 93% - Sats walking on 4lpm 90% Started bipap per Dr Leonides Sake 11/29/16  - 12/05/2016  Patient Saturations on Room Air at Rest =88% Patient Saturations on 2lpm o2= 93% - sats ok 06/06/2017 on 6 mw on 4lpm  - HCO3 10/01/17 = 25 so hypercarbia clearly better  - 05/21/2018   [facility] RA  2 laps @ 185 ft each stopped due to desats to 86% corrected on 2lpm / slow pace    rec as of 01/06/2019 = 2lpm hs with cpap and as needed with activity up to 3lpm    Adequate control on present rx, reviewed in detail with pt > no change in rx needed

## 2019-01-10 NOTE — Assessment & Plan Note (Signed)
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 However, there does appear to be a mild craniocaudal gradient. The possibility of early usual interstitial pneumonia (UIP) although not favored, is not entirely excluded. Accordingly, repeat high-resolution chest CT is recommended in 12 months  - PFT's  03/06/2017  F VC  1.77 (63%)  no % improvement from saba p 0  prior to study with DLCO  67/66 % corrects to 99  % for alv volume   - 3mw  06/06/2017   304 m on 4lpm s stopping or desats - HRCT chest 11/13/17 1. 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 Pt doing a good job of monitoring her sats with ex and will call if loses ground, so f/u can be q  12 months

## 2019-01-11 ENCOUNTER — Other Ambulatory Visit: Payer: Self-pay

## 2019-01-12 ENCOUNTER — Encounter: Payer: Self-pay | Admitting: Thoracic Surgery (Cardiothoracic Vascular Surgery)

## 2019-01-12 ENCOUNTER — Institutional Professional Consult (permissible substitution): Payer: Medicare Other | Admitting: Thoracic Surgery (Cardiothoracic Vascular Surgery)

## 2019-01-12 VITALS — BP 144/70 | HR 77 | Temp 98.1°F | Resp 20 | Ht 60.0 in | Wt 192.0 lb

## 2019-01-12 DIAGNOSIS — I712 Thoracic aortic aneurysm, without rupture, unspecified: Secondary | ICD-10-CM

## 2019-01-12 DIAGNOSIS — I77810 Thoracic aortic ectasia: Secondary | ICD-10-CM

## 2019-01-12 DIAGNOSIS — J8410 Pulmonary fibrosis, unspecified: Secondary | ICD-10-CM

## 2019-01-12 NOTE — Progress Notes (Signed)
PCP is [name], MD Referring Provider is Laurann Montana, PA-C  Chief Complaint  Patient presents with  . TAA    CTA CHEST 10/26/18...ECHO 09/06/16    HPI: [name] is sent for consultation regarding a descending thoracic aortic aneurysm.  [name] is a 68 year old woman with a past medical history significant for hypertension, hypercholesterolemia, tobacco abuse (quit 2012), interstitial lung disease (NSIP), COPD, chronic respiratory failure, persistent atrial fibrillation, coronary atherosclerosis, chronic diastolic congestive heart failure, and osteoarthritis.  She is followed by Dr. Sherene Sires for NSIP.  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  She recently had a CT to follow-up the aneurysm and it was noted to be larger by report.  She is primarily limited by shortness of breath.  She is on 2 L oxygen at baseline and sometimes will turn it up if she is going to walk.  She can walk about 10 to 15 minutes before becoming short of breath.  This is at a very slow pace.  Her respiratory symptoms have been stable.  She is not having any chest pain, pressure, or tightness.  She is not having any back pain.   Past Medical History:  Diagnosis Date  . Chronic diastolic CHF (congestive heart failure) (HCC)   . Chronic respiratory failure (HCC)   . Coronary atherosclerosis    a. 2V by CT 10/2017.  . Emphysema of lung (HCC)   . Hypercholesterolemia   . Hypertension   . NSIP (nonspecific interstitial pneumonia) (HCC)   . Osteoarthritis   . Persistent atrial fibrillation   . Thoracic aortic ectasia [facility])     Past Surgical History:  Procedure Laterality Date  . CARDIOVERSION N/A 07/25/2016   Procedure: CARDIOVERSION;  Surgeon: Pricilla Riffle, MD;  Location: [facility];  Service: Cardiovascular;  Laterality: N/A;  . CARDIOVERSION N/A 09/14/2016   Procedure: CARDIOVERSION;  Surgeon: Hillis Range, MD;  Location: [facility] OR;  Service:  Cardiovascular;  Laterality: N/A;  . CATARACT EXTRACTION Bilateral   . LEG SURGERY Right   . TEE WITHOUT CARDIOVERSION N/A 07/25/2016   Procedure: TRANSESOPHAGEAL ECHOCARDIOGRAM (TEE);  Surgeon: Pricilla Riffle, MD;  Location: [facility]COPY;  Service: Cardiovascular;  Laterality: N/A;    Family History  Problem Relation Age of Onset  . Heart disease Father   . Heart attack Sister   . Stroke Neg Hx     Social History Social History   Tobacco Use  . Smoking status: Former Smoker    Packs/day: 1.00    Years: 43.00    Pack years: 43.00    Types: Cigarettes    Start date: 08/26/1966    Last attempt to quit: 08/26/2010    Years since quitting: 8.3  . Smokeless tobacco: Never Used  Substance Use Topics  . Alcohol use: No    Alcohol/week: 0.0 standard drinks  . Drug use: No    Current Outpatient Medications  Medication Sig Dispense Refill  . acetaminophen (TYLENOL) 500 MG tablet Take 500 mg by mouth every 6 (six) hours as needed for moderate pain.    Marland Kitchen apixaban (ELIQUIS) 5 MG TABS tablet Take 1 tablet (5 mg total) by mouth 2 (two) times daily. 60 tablet 0  . atorvastatin (LIPITOR) 10 MG tablet Take 1 tablet (10 mg total) by mouth daily at 6 PM. 30 tablet 0  . brimonidine (ALPHAGAN P) 0.1 % SOLN as directed.    . diltiazem (CARDIZEM CD)  240 MG 24 hr capsule TAKE ONE CAPSULE BY MOUTH DAILY 90 capsule 2  . dofetilide (TIKOSYN) 500 MCG capsule TAKE ONE CAPSULE BY MOUTH TWICE DAILY 60 capsule 6  . furosemide (LASIX) 40 MG tablet TAKE 1 TABLET(40 MG) BY MOUTH DAILY 90 tablet 3  . HYDROcodone-acetaminophen (NORCO/VICODIN) 5-325 MG tablet Take 1 tablet by mouth every 6 (six) hours as needed for moderate pain.    Marland Kitchen. latanoprost (XALATAN) 0.005 % ophthalmic solution Place 1 drop into both eyes at bedtime.    Marland Kitchen. LORazepam (ATIVAN) 1 MG tablet TAKE 1 TABLET BY MOUTH AS NEEDED FOR ANXIETY  1  . Multiple Vitamin (MULTIVITAMIN) capsule Take 1 capsule by mouth daily.    . OXYGEN 2lpm 24/7 and 4 lpm with  exertion [facility]    . UNABLE TO FIND BIPAP with 2lpm o2 at night  [facility]     No current facility-administered medications for this visit.     Allergies  Allergen Reactions  . Levaquin [Levofloxacin In D5w] Rash  . Percocet [Oxycodone-Acetaminophen] Itching    Review of Systems  Constitutional: Negative for activity change (No recent change but limited), appetite change and unexpected weight change.  HENT: Negative for trouble swallowing and voice change.   Eyes: Negative for visual disturbance.  Respiratory: Positive for cough and shortness of breath.   Cardiovascular: Positive for leg swelling (In the afternoons). Negative for chest pain.  Gastrointestinal: Negative for abdominal pain and blood in stool.  Genitourinary: Negative for dysuria.  Musculoskeletal: Positive for arthralgias and joint swelling.  Neurological: Negative for syncope and weakness.  Hematological: Negative for adenopathy. Bruises/bleeds easily (On apixaban).  All other systems reviewed and are negative.   BP (!) 144/70 (BP Location: Right Arm, Patient Position: Sitting, Cuff Size: Large)   Pulse 77   Temp 98.1 F (36.7 C) (Skin)   Resp 20   Ht 5' (1.524 m)   Wt 192 lb (87.1 kg)   SpO2 96% Comment: 2L O2 CONT.  BMI 37.50 kg/m  Physical Exam Vitals signs reviewed.  Constitutional:      General: She is not in acute distress.    Appearance: She is ill-appearing.  HENT:     Head: Normocephalic and atraumatic.     Comments: Wearing surgical mask Eyes:     General: No scleral icterus.    Extraocular Movements: Extraocular movements intact.     Conjunctiva/sclera: Conjunctivae normal.  Neck:     Musculoskeletal: Neck supple.  Cardiovascular:     Rate and Rhythm: Rhythm irregular.     Heart sounds: No murmur.  Pulmonary:     Effort: Pulmonary effort is normal.     Breath sounds: Rales (Faint at bases bilaterally) present.  Abdominal:     General: There is no distension.     Palpations: Abdomen is  soft.     Tenderness: There is no abdominal tenderness.  Musculoskeletal:        General: No swelling.  Lymphadenopathy:     Cervical: No cervical adenopathy.  Skin:    General: Skin is warm and dry.  Neurological:     General: No focal deficit present.     Mental Status: She is alert and oriented to person, place, and time.     Cranial Nerves: No cranial nerve deficit.     Motor: No weakness.    Diagnostic Tests: CT ANGIOGRAPHY CHEST WITH CONTRAST  TECHNIQUE: Multidetector CT imaging of the chest was performed using the standard protocol during bolus administration of intravenous  contrast. Multiplanar CT image reconstructions and MIPs were obtained to evaluate the vascular anatomy.  CONTRAST:  ISOVUE-370 IOPAMIDOL (ISOVUE-370) INJECTION 76%  COMPARISON:  Chest CT-11/04/2017; 11/07/2016  FINDINGS: Vascular Findings:  Grossly unchanged ectasia of the ascending thoracic aorta with measurements as follows.  Compared to the 10/2016 noncontrast chest CT, there has been suspected slight progression of bilobed aneurysmal dilatation of the proximal and distal descending thoracic aorta with measurements as follows. The mid aspect of the descending thoracic aorta is tortuous but of normal caliber.  There is a moderate to large amount of irregular mixed calcified and noncalcified atherosclerotic plaque throughout the abdominal aorta, not resulting in hemodynamically significant stenosis. No definite evidence of abdominal aortic dissection or periaortic stranding on this nongated examination.  Conventional configuration of the aortic arch. The branch vessels of the aortic arch appear widely patent throughout their imaged courses.  Normal heart size. Coronary artery calcifications. No pericardial effusion.  Although this examination was not tailored for the evaluation the pulmonary arteries, there are no discrete filling defects within the central pulmonary  arterial tree to suggest central pulmonary embolism. Borderline enlarged caliber the main pulmonary artery measuring 32 mm in diameter.  -------------------------------------------------------------  Thoracic aortic measurements:  Sinotubular junction  34 mm as measured in greatest oblique short axis coronal dimension.  Proximal ascending aorta  38 x 38 as measured in greatest oblique short axis axial (image 40, series 4) and coronal (image 52, series 7), dimensions respectively, unchanged previously 38 mm when compared to the 10/2016 examination.  Aortic arch aorta  29 mm as measured in greatest oblique short axis sagittal dimension.  Proximal descending thoracic aorta  Slight increase in size of focal aneurysmal dilatation of the proximal descending thoracic aorta now measuring approximately 41 x 40 x 46 mm as measured in greatest oblique short axis axial (image 40, series 4), coronal (image 93, series 7) and sagittal (image 89, series 8), dimensions respectively, previously, 37 x 37 x 41 mm when compared to the 10/2016 examination.  Distal descending thoracic aorta  Slight increase in size of focal aneurysmal dilatation involving the distal descending thoracic aorta now measuring approximately 41 x 42 x 44 mm as measured in greatest oblique short axis axial (image 74, series 4), sagittal (image 88, series 8) and coronal (image 95, series 7) dimensions respectively, previously, 39 x 41 x 40 when compared to the 10/2016 examination.  Review of the MIP images confirms the above findings.  -------------------------------------------------------------  Non-Vascular Findings:  Mediastinum/Lymph Nodes: No bulky mediastinal, hilar or axillary lymphadenopathy.  Lungs/Pleura: Grossly unchanged subpleural reticular opacities, asymmetrically involving the right lung to a greater extent than the left. No discrete areas of honeycombing. No focal  airspace opacities. There is unchanged minimal amount of pleuroparenchymal thickening about the peripheral aspect of the right major fissure. No pleural effusion or pneumothorax. The central pulmonary airways appear widely patent. No discrete pulmonary nodules.  Upper abdomen: Limited early arterial phase evaluation of the upper abdomen is normal.  Musculoskeletal: No acute or aggressive osseous abnormalities. Stigmata of DISH within the mid and caudal aspects of the thoracic spine. Regional soft tissues appear normal.  IMPRESSION: 1. Suspected slight increase in size of uncomplicated bilobed focal aneurysmal dilatation of the proximal and distal descending thoracic aorta now measuring 46 and 44 mm in diameter respectively, previously, 41 and 41 mm when compared to noncontrast chest CT performed 10/2016. Recommend semi-annual imaging followup by CTA or MRA and referral to cardiothoracic surgery if not already obtained.  This recommendation follows 2010 ACCF/AHA/AATS/ACR/ASA/SCA/SCAI/SIR/STS/SVM Guidelines for the Diagnosis and Management of Patients With Thoracic Aortic Disease. Circulation. 2010; 121: X480-X655. Aortic aneurysm NOS (ICD10-I71.9) 2. Stable uncomplicated mild fusiform ectasia of the ascending thoracic aorta measuring 38 mm in diameter, unchanged. 3. Coronary artery calcifications. Aortic Atherosclerosis (ICD10-I70.0). 4. Grossly unchanged parenchymal abnormalities previously characterized as NSIP on 11/13/2017 ILD protocol chest CT.   Electronically Signed   By: Simonne Come M.D. I personally reviewed the CT images and concur with the findings noted above.  Difficult to determine exact measurements on previous scans as no contrast was used.  The aneurysms do appear larger than 2018 but appear relatively unchanged from 2019.  Impression: [name] is a 68 year old woman with a history of hypertension, hypercholesterolemia, interstitial lung disease (NSIP),  COPD, chronic respiratory failure, persistent atrial fibrillation, coronary atherosclerosis, chronic diastolic congestive heart failure, and osteoarthritis.  She was first noted to have a dilated ascending thoracic aorta on a high resolution CT done to evaluate ILD in 2018.  That was not done with contrast and there really was not much detail.  She had a high resolution CT in 2019.  The report noted a 4 cm fusiform ectasia of the descending thoracic aorta.  A CT was done 2 months ago for fall that ended by report the aneurysms in the proximal and distal descending thoracic aorta had increased in size from 4.1 cm to 4.6 and 4.4 mm respectively.  Looking at the scans side-by-side I do not think the changes been that dramatic although I do think there has been some increased.  She does have a elongated and tortuous thoracic aorta.  I reviewed the CT scans with Ms. Deliz.  We discussed the indications for surgery.  Typically we would look for aneurysms of 5.5 cm or so in that area prior to recommending intervention.  She does know that if she would develop severe back pain she is to seek medical attention immediately.  Hypertension-she is on Cardizem.  Her blood pressure is minimally elevated today.  There may be a component of whitecoat hypertension with that.  I emphasized the importance for monitoring her blood pressure at home on a regular basis does get a true baseline.  ILD-findings consistent with NSIP.  Followed by Dr. Sherene Sires.  Symptoms stable.  Atrial fibrillation-followed by Dr. [name].  Rate is controlled and on apixaban.  Plan:  Return in 6 months with CT chest  Loreli Slot, MD [facility] [phone]

## 2019-01-22 ENCOUNTER — Telehealth: Payer: Self-pay

## 2019-01-22 NOTE — Telephone Encounter (Signed)
Virtual Visit Pre-Appointment Phone Call TELEPHONE CALL NOTE  [name] has been deemed a candidate for a follow-up tele-health visit to limit community exposure during the Covid-19 pandemic. I spoke with the patient via phone to ensure availability of phone/video source, confirm preferred email & phone number, and discuss instructions and expectations.  I reminded [name] to be prepared with any vital sign and/or heart rhythm information that could potentially be obtained via home monitoring, at the time of her visit. I reminded [name] to expect a phone call prior to her visit.  Patient agrees to consent below.  Lattie Haw, RN 01/22/2019 10:43 AM    FULL LENGTH CONSENT FOR TELE-HEALTH VISIT   I hereby voluntarily request, consent and authorize [facility] and its employed or contracted physicians, physician assistants, nurse practitioners or other licensed health care professionals (the Practitioner), to provide me with telemedicine health care services (the "Services") as deemed necessary by the treating Practitioner. I acknowledge and consent to receive the Services by the Practitioner via telemedicine. I understand that the telemedicine visit will involve communicating with the Practitioner through live audiovisual communication technology and the disclosure of certain medical information by electronic transmission. I acknowledge that I have been given the opportunity to request an in-person assessment or other available alternative prior to the telemedicine visit and am voluntarily participating in the telemedicine visit.  I understand that I have the right to withhold or withdraw my consent to the use of telemedicine in the course of my care at any time, without affecting my right to future care or treatment, and that the Practitioner or I may terminate the telemedicine visit at any time. I understand that I have the right to inspect all information  obtained and/or recorded in the course of the telemedicine visit and may receive copies of available information for a reasonable fee.  I understand that some of the potential risks of receiving the Services via telemedicine include:  Marland Kitchen Delay or interruption in medical evaluation due to technological equipment failure or disruption; . Information transmitted may not be sufficient (e.g. poor resolution of images) to allow for appropriate medical decision making by the Practitioner; and/or  . In rare instances, security protocols could fail, causing a breach of personal health information.  Furthermore, I acknowledge that it is my responsibility to provide information about my medical history, conditions and care that is complete and accurate to the best of my ability. I acknowledge that Practitioner's advice, recommendations, and/or decision may be based on factors not within their control, such as incomplete or inaccurate data provided by me or distortions of diagnostic images or specimens that may result from electronic transmissions. I understand that the practice of medicine is not an exact science and that Practitioner makes no warranties or guarantees regarding treatment outcomes. I acknowledge that I will receive a copy of this consent concurrently upon execution via email to the email address I last provided but may also request a printed copy by calling the office of [facility].    I understand that my insurance will be billed for this visit.   I have read or had this consent read to me. . I understand the contents of this consent, which adequately explains the benefits and risks of the Services being provided via telemedicine.  . I have been provided ample opportunity to ask questions regarding this consent and the Services and have had my questions answered to my satisfaction. Marland Kitchen I  give my informed consent for the services to be provided through the use of telemedicine in my medical care   By participating in this telemedicine visit I agree to the above.

## 2019-01-25 ENCOUNTER — Other Ambulatory Visit: Payer: Self-pay

## 2019-01-25 ENCOUNTER — Encounter: Payer: Self-pay | Admitting: Interventional Cardiology

## 2019-01-25 ENCOUNTER — Telehealth (INDEPENDENT_AMBULATORY_CARE_PROVIDER_SITE_OTHER): Payer: Medicare Other | Admitting: Interventional Cardiology

## 2019-01-25 VITALS — BP 132/73 | HR 72 | Ht 60.0 in | Wt 194.8 lb

## 2019-01-25 DIAGNOSIS — J441 Chronic obstructive pulmonary disease with (acute) exacerbation: Secondary | ICD-10-CM

## 2019-01-25 DIAGNOSIS — I2584 Coronary atherosclerosis due to calcified coronary lesion: Secondary | ICD-10-CM

## 2019-01-25 DIAGNOSIS — I2510 Atherosclerotic heart disease of native coronary artery without angina pectoris: Secondary | ICD-10-CM

## 2019-01-25 DIAGNOSIS — I77810 Thoracic aortic ectasia: Secondary | ICD-10-CM

## 2019-01-25 DIAGNOSIS — Z7901 Long term (current) use of anticoagulants: Secondary | ICD-10-CM

## 2019-01-25 DIAGNOSIS — I4819 Other persistent atrial fibrillation: Secondary | ICD-10-CM

## 2019-01-25 DIAGNOSIS — I5032 Chronic diastolic (congestive) heart failure: Secondary | ICD-10-CM

## 2019-01-25 NOTE — Progress Notes (Signed)
Virtual Visit via Video Note   This visit type was conducted due to national recommendations for restrictions regarding the COVID-19 Pandemic (e.g. social distancing) in an effort to limit this patient's exposure and mitigate transmission in our community.  Due to her co-morbid illnesses, this patient is at least at moderate risk for complications without adequate follow up.  This format is felt to be most appropriate for this patient at this time.  All issues noted in this document were discussed and addressed.  A limited physical exam was performed with this format.  Please refer to the patient's chart for her consent to telehealth for [facility].   Date:  01/25/2019   ID:  [name], DOB [birth year], MRN [id number]  Patient Location: Home Provider Location: Home  PCP:  [name], MD  Cardiologist:  Lance Muss, MD  Electrophysiologist:  None   Evaluation Performed:  Follow-Up Visit  Chief Complaint:  AFib  History of Present Illness:    [name] is a 68 y.o. female with a past medical history significant for hypertension, hypercholesterolemia, tobacco abuse (quit 2012), interstitial lung disease (NSIP), COPD, chronic respiratory failure, persistent atrial fibrillation, coronary atherosclerosis, chronic diastolic congestive heart failure, and osteoarthritis.  She wears oxygen continuously, typically.  She sometimes removes it if she is at rest for a while and sats are above 90%.  An irregular heartbeat was detecetedin 2017. ECG was done showing atrial fibrillation in 11/17. She was admittted with CHF as well at that time. She had a TEE/CV. She went back to AFib that same night.   Eventually started on Tikosyn and followed in AFib clinic.  3/19 CT scan showed 2 vessel atherosclerosis and mildly dilated thoracic aneurysm. No anginal sx.    She is followed by Dr. Sherene Sires for NSIP, emphysema.  She was found to have an aneurysm of the descending thoracic  aorta on a high resolution CT for ILD.  She recently had a CT to follow-up the aneurysm and it was noted to be larger by report.  Denies : Chest pain. Dizziness. Leg edema. Nitroglycerin use. Orthopnea. Palpitations. Paroxysmal nocturnal dyspnea. Shortness of breath. Syncope.   No problems with Tikosyn. She walks about 10-15 minutes in the mornings.  The patient does not have symptoms concerning for COVID-19 infection (fever, chills, cough, or new shortness of breath).    Past Medical History:  Diagnosis Date   Chronic diastolic CHF (congestive heart failure) (HCC)    Chronic respiratory failure (HCC)    Coronary atherosclerosis    a. 2V by CT 10/2017.   Emphysema of lung (HCC)    Hypercholesterolemia    Hypertension    NSIP (nonspecific interstitial pneumonia) (HCC)    Osteoarthritis    Persistent atrial fibrillation    Thoracic aortic ectasia [facility])    Past Surgical History:  Procedure Laterality Date   CARDIOVERSION N/A 07/25/2016   Procedure: CARDIOVERSION;  Surgeon: Pricilla Riffle, MD;  Location: [facility] ENDOSCOPY;  Service: Cardiovascular;  Laterality: N/A;   CARDIOVERSION N/A 09/14/2016   Procedure: CARDIOVERSION;  Surgeon: Hillis Range, MD;  Location: [facility] OR;  Service: Cardiovascular;  Laterality: N/A;   CATARACT EXTRACTION Bilateral    LEG SURGERY Right    TEE WITHOUT CARDIOVERSION N/A 07/25/2016   Procedure: TRANSESOPHAGEAL ECHOCARDIOGRAM (TEE);  Surgeon: Pricilla Riffle, MD;  Location: [facility] ENDOSCOPY;  Service: Cardiovascular;  Laterality: N/A;     Current Meds  Medication Sig   acetaminophen (TYLENOL) 500 MG tablet Take 500 mg by  mouth every 6 (six) hours as needed for moderate pain.   apixaban (ELIQUIS) 5 MG TABS tablet Take 1 tablet (5 mg total) by mouth 2 (two) times daily.   atorvastatin (LIPITOR) 10 MG tablet Take 1 tablet (10 mg total) by mouth daily at 6 PM.   brimonidine (ALPHAGAN P) 0.1 % SOLN as directed.   diltiazem (CARDIZEM CD) 240 MG 24 hr  capsule TAKE ONE CAPSULE BY MOUTH DAILY   dofetilide (TIKOSYN) 500 MCG capsule TAKE ONE CAPSULE BY MOUTH TWICE DAILY   furosemide (LASIX) 40 MG tablet TAKE 1 TABLET(40 MG) BY MOUTH DAILY   HYDROcodone-acetaminophen (NORCO/VICODIN) 5-325 MG tablet Take 1 tablet by mouth every 6 (six) hours as needed for moderate pain.   latanoprost (XALATAN) 0.005 % ophthalmic solution Place 1 drop into both eyes at bedtime.   LORazepam (ATIVAN) 1 MG tablet TAKE 1 TABLET BY MOUTH AS NEEDED FOR ANXIETY   Multiple Vitamin (MULTIVITAMIN) capsule Take 1 capsule by mouth daily.   OXYGEN 2lpm 24/7 and 4 lpm with exertion [facility]   UNABLE TO FIND BIPAP with 2lpm o2 at night  [facility]     Allergies:   Levaquin [levofloxacin in d5w] and Percocet [oxycodone-acetaminophen]   Social History   Tobacco Use   Smoking status: Former Smoker    Packs/day: 1.00    Years: 43.00    Pack years: 43.00    Types: Cigarettes    Start date: 08/26/1966    Last attempt to quit: 08/26/2010    Years since quitting: 8.4   Smokeless tobacco: Never Used  Substance Use Topics   Alcohol use: No    Alcohol/week: 0.0 standard drinks   Drug use: No     Family Hx: The patient's family history includes Heart attack in her sister; Heart disease in her father. There is no history of Stroke.  ROS:   Please see the history of present illness.    Chronic shortness of breath All other systems reviewed and are negative.   Prior CV studies:   The following studies were reviewed today:    Labs/Other Tests and Data Reviewed:    EKG:  An ECG dated 12/19 was personally reviewed today and demonstrated:  NSR, no ST changes  Recent Labs: 07/30/2018: Hemoglobin 14.4; Magnesium 2.3; Platelets 198 10/21/2018: BUN 16; Creatinine, Ser 0.89; Potassium 4.3; Sodium 139   Recent Lipid Panel Lab Results  Component Value Date/Time   CHOL 205 (H) 07/24/2016 12:44 AM   TRIG 50 07/24/2016 12:44 AM   HDL 64 07/24/2016 12:44 AM   CHOLHDL 3.2  07/24/2016 12:44 AM   LDLCALC 131 (H) 07/24/2016 12:44 AM    Wt Readings from Last 3 Encounters:  01/25/19 194 lb 12.8 oz (88.4 kg)  01/12/19 192 lb (87.1 kg)  01/06/19 193 lb 9.6 oz (87.8 kg)     Objective:    Vital Signs:  BP 132/73    Pulse 72    Ht 5' (1.524 m)    Wt 194 lb 12.8 oz (88.4 kg)    BMI 38.04 kg/m    VITAL SIGNS:  reviewed GEN:  no acute distress RESPIRATORY:  normal respiratory effort, symmetric expansion PSYCH:  normal affect exam limited by video format  ASSESSMENT & PLAN:    1. AFib: NSR on EG 12/19. On Tikosyn. Followed by AFib clinic.  Eliquis for stroke prevention.   2. Chronic diastolic heart failure: No leg swelling typically.   3. Anticoagulated: No bleeding problems.  Eliquis for stroke prevention.  4. COPD: On home oxygen. Checks her own O2 sat. 5. Coronary calcium: No angina.  6. Thoracic aneurysm: 46 mm.  Following with CT surgery.   COVID-19 Education: The signs and symptoms of COVID-19 were discussed with the patient and how to seek care for testing (follow up with PCP or arrange E-visit).  The importance of social distancing was discussed today.  Time:   Today, I have spent 15 minutes with the patient with telehealth technology discussing the above problems.     Medication Adjustments/Labs and Tests Ordered: Current medicines are reviewed at length with the patient today.  Concerns regarding medicines are outlined above.   Tests Ordered: No orders of the defined types were placed in this encounter.   Medication Changes: No orders of the defined types were placed in this encounter.   Disposition:  Follow up in 1 year(s)  Signed, Lance Muss, MD  01/25/2019 2:49 PM    [facility]

## 2019-01-25 NOTE — Patient Instructions (Signed)

## 2019-01-29 ENCOUNTER — Ambulatory Visit: Payer: Medicare Other | Admitting: Interventional Cardiology

## 2019-02-03 ENCOUNTER — Other Ambulatory Visit: Payer: Self-pay | Admitting: Interventional Cardiology

## 2019-03-02 ENCOUNTER — Ambulatory Visit: Payer: Medicare Other | Admitting: Internal Medicine

## 2019-03-25 ENCOUNTER — Telehealth: Payer: Self-pay | Admitting: Interventional Cardiology

## 2019-03-25 NOTE — Telephone Encounter (Signed)
Spoke with [name] and Dr Kenton Kingfisher wanted pt to monitor B/P and call with readings and then Dr Doreene Adas office called wanting Dr Irish Lack to add another B/P med.[name] will discuss with Dr Kenton Kingfisher if he wishes to start another med .Will call back if wants Dr Irish Lack to address .Adonis Housekeeper

## 2019-03-25 NOTE — Telephone Encounter (Signed)
New Message     Almyra Free from [place] is calling because The pt is still having high BP and they are wanting to add another BP medication   BP 145/88 163/100 153/96 142/98 147/94 155/88    Please call

## 2019-05-13 ENCOUNTER — Other Ambulatory Visit: Payer: Self-pay | Admitting: Interventional Cardiology

## 2019-06-15 ENCOUNTER — Other Ambulatory Visit (HOSPITAL_COMMUNITY): Payer: Self-pay | Admitting: Nurse Practitioner

## 2019-06-16 ENCOUNTER — Other Ambulatory Visit (HOSPITAL_COMMUNITY): Payer: Self-pay | Admitting: *Deleted

## 2019-06-16 MED ORDER — APIXABAN 5 MG PO TABS: 5.0000 mg | ORAL_TABLET | Freq: Two times a day (BID) | ORAL | 0 refills | Status: DC

## 2019-06-16 NOTE — Telephone Encounter (Signed)
Pt last saw Dr Irish Lack 01/25/19, last labs 10/21/18 Creat 0.89, age 68, weight 88.4kg, based on specified criteria pt is on appropriate dosage of Eliquis 5mg  BID.  Will refill rx.

## 2019-06-17 ENCOUNTER — Other Ambulatory Visit: Payer: Self-pay | Admitting: Thoracic Surgery (Cardiothoracic Vascular Surgery)

## 2019-06-17 DIAGNOSIS — I712 Thoracic aortic aneurysm, without rupture, unspecified: Secondary | ICD-10-CM

## 2019-06-17 DIAGNOSIS — I716 Thoracoabdominal aortic aneurysm, without rupture, unspecified: Secondary | ICD-10-CM

## 2019-07-05 ENCOUNTER — Other Ambulatory Visit: Payer: Self-pay

## 2019-07-05 DIAGNOSIS — Z20822 Contact with and (suspected) exposure to covid-19: Secondary | ICD-10-CM

## 2019-07-08 LAB — NOVEL CORONAVIRUS, NAA: SARS-CoV-2, NAA: NOT DETECTED

## 2019-07-14 ENCOUNTER — Ambulatory Visit
Admission: RE | Admit: 2019-07-14 | Discharge: 2019-07-14 | Disposition: A | Discharge status: Home / Self Care | Payer: Medicare Other | Source: Ambulatory Visit | Attending: Thoracic Surgery (Cardiothoracic Vascular Surgery) | Admitting: Thoracic Surgery (Cardiothoracic Vascular Surgery)

## 2019-07-14 DIAGNOSIS — I712 Thoracic aortic aneurysm, without rupture, unspecified: Secondary | ICD-10-CM

## 2019-07-14 DIAGNOSIS — I716 Thoracoabdominal aortic aneurysm, without rupture, unspecified: Secondary | ICD-10-CM

## 2019-07-14 MED ORDER — IOPAMIDOL (ISOVUE-370) INJECTION 76%
75.0000 mL | Freq: Once | INTRAVENOUS | Status: AC | PRN
  Administered 2019-07-14: 75 mL via INTRAVENOUS

## 2019-07-20 ENCOUNTER — Ambulatory Visit: Payer: Medicare Other | Admitting: Thoracic Surgery (Cardiothoracic Vascular Surgery)

## 2019-07-20 ENCOUNTER — Other Ambulatory Visit: Payer: Self-pay

## 2019-07-20 ENCOUNTER — Encounter: Payer: Self-pay | Admitting: Thoracic Surgery (Cardiothoracic Vascular Surgery)

## 2019-07-20 VITALS — BP 153/82 | HR 65 | Temp 97.9°F | Resp 20 | Ht 60.0 in | Wt 207.0 lb

## 2019-07-20 DIAGNOSIS — I716 Thoracoabdominal aortic aneurysm, without rupture, unspecified: Secondary | ICD-10-CM

## 2019-07-20 DIAGNOSIS — I712 Thoracic aortic aneurysm, without rupture, unspecified: Secondary | ICD-10-CM

## 2019-07-20 DIAGNOSIS — I77811 Abdominal aortic ectasia: Secondary | ICD-10-CM

## 2019-07-20 NOTE — Progress Notes (Signed)
[address]       [name] [address]             [phone]     HPI: Ms. Herek returns for scheduled follow-up visit   [name] is a 68 year old woman with a past history of hypertension, hyperlipidemia, tobacco abuse (quit 2012), ILD (NSIP), COPD, oxygen dependent chronic respiratory failure, persistent atrial fibrillation, coronary atherosclerosis, chronic diastolic congestive heart failure, thoracic aortic atherosclerosis and descending thoracic aneurysms.  She has been followed with CT scans for her ILD.  Back in the spring she had a CT which showed an increase in the size of the descending thoracic aortic aneurysm.  On my review the aneurysm appeared larger than it had in 2018 but about the same as it had been a year earlier.  We recommended 87-month follow-up.  In the interim since her last visit she has been feeling fairly well.  She still is oxygen dependent on 2 L nasal cannula.  She has her baseline dyspnea, which has not changed.  She is not having any chest pain, pressure, or tightness.  She is on long-acting diltiazem for her blood pressure.  She checks her blood pressure at home and says it runs about 130-136 systolic the majority of the time.  Past Medical History:  Diagnosis Date  . Chronic diastolic CHF (congestive heart failure) (HCC)   . Chronic respiratory failure (HCC)   . Coronary atherosclerosis    a. 2V by CT 10/2017.  . Emphysema of lung (HCC)   . Hypercholesterolemia   . Hypertension   . NSIP (nonspecific interstitial pneumonia) (HCC)   . Osteoarthritis   . Persistent atrial fibrillation (HCC)   . Thoracic aortic ectasia (HCC)     Current Outpatient Medications  Medication Sig Dispense Refill  . acetaminophen (TYLENOL) 500 MG tablet Take 500 mg by mouth every 6 (six) hours as needed for moderate pain.    Marland Kitchen atorvastatin (LIPITOR) 10 MG tablet Take 1 tablet (10 mg total) by mouth daily at 6 PM. 30 tablet 0  . brimonidine  (ALPHAGAN P) 0.1 % SOLN as directed.    Marland Kitchen CARTIA XT 240 MG 24 hr capsule TAKE 1 CAPSULE BY MOUTH DAILY 90 capsule 3  . dofetilide (TIKOSYN) 500 MCG capsule TAKE ONE CAPSULE BY MOUTH TWICE DAILY 60 capsule 6  . ELIQUIS 5 MG TABS tablet TAKE 1 TABLET BY MOUTH TWICE DAILY 180 tablet 1  . furosemide (LASIX) 40 MG tablet TAKE 1 TABLET(40 MG) BY MOUTH DAILY 90 tablet 2  . HYDROcodone-acetaminophen (NORCO/VICODIN) 5-325 MG tablet Take 1 tablet by mouth every 6 (six) hours as needed for moderate pain.    Marland Kitchen latanoprost (XALATAN) 0.005 % ophthalmic solution Place 1 drop into both eyes at bedtime.    Marland Kitchen LORazepam (ATIVAN) 1 MG tablet TAKE 1 TABLET BY MOUTH AS NEEDED FOR ANXIETY  1  . Multiple Vitamin (MULTIVITAMIN) capsule Take 1 capsule by mouth daily.    . OXYGEN 2lpm 24/7 and 4 lpm with exertion [facility]    . UNABLE TO FIND BIPAP with 2lpm o2 at night  [facility]     No current facility-administered medications for this visit.     Physical Exam BP (!) 153/82 (BP Location: Left Arm)   Pulse 65   Temp 97.9 F (36.6 C) (Skin)   Resp 20   Ht 5' (1.524 m)   Wt 207 lb (93.9 kg)   SpO2 94% Comment: on O2 @ 2  L  BMI 40.44 kg/m  68 year old woman in no acute distress Alert and oriented x3 with no focal deficits Wearing surgical mask Cardiac irregularly irregular rhythm, no murmur Lungs faint crackles bases posteriorly 1+ peripheral edema  Diagnostic Tests: CT ANGIOGRAPHY CHEST AND ABDOMEN  TECHNIQUE: Multidetector CT imaging of the chest and abdomen was performed using the standard protocol during bolus administration of intravenous contrast. Multiplanar CT image reconstructions and MIPs were obtained to evaluate the vascular anatomy.  CONTRAST:  31mL ISOVUE-370 IOPAMIDOL (ISOVUE-370) INJECTION 76%  COMPARISON:  10/26/18  FINDINGS: CTA CHEST FINDINGS  Cardiovascular:  Extensive atherosclerotic disease involving the thoracic aorta is again noted. No evidence for dissection. Aneurysmal  dilatation of the descending thoracic aorta is noted, measurements as described below:  At the level of the sino-tubular junction the thoracic aorta measures 3.2 cm, image 106/3. Previously this was measured at 3.4 cm.  The ascending thoracic aorta measures 3.6 cm in maximum dimension, image 98/13. Previously this measured the same.  The anterior arch of the aorta measures 2.9 cm, image 103/15. Stable.  The posterior arch measures 2.9 cm, image 117/15.  Focal aneurysmal dilatation of the proximal descending thoracic aorta measures 4.1 cm, unchanged from previous exam, image 103/103.  Just above the level of the hiatus the distal descending thoracic aorta measures 4 cm, image 104/3. Unchanged.  Heart size is normal. Calcifications in the LAD and left circumflex and RCA coronary arteries noted.  Mediastinum/Nodes: No enlarged mediastinal, hilar, or axillary lymph nodes. Thyroid gland, trachea, and esophagus demonstrate no significant findings.  Lungs/Pleura: Mild diffuse reticular interstitial opacities are identified bilaterally with a lower lung zone predominance. Mild patchy areas of ground-glass attenuation are also noted. 3 mm left apex nodule is noted, image 28/7. Unchanged. Stable appearance of small perifissural nodules along the oblique fissure.  Musculoskeletal: Scoliosis and degenerative disc disease identified.  Review of the MIP images confirms the above findings.  CTA ABDOMEN FINDINGS  Aorta: Aortic atherosclerosis. Ectasias of the infrarenal abdominal aorta measures 2.5 cm, image 102/7.  Celiac: Patent without evidence of aneurysm, dissection, vasculitis or significant stenosis.  SMA: Patent without evidence of aneurysm, dissection, vasculitis or significant stenosis.  Renals: Both renal arteries are patent without evidence of aneurysm, dissection, vasculitis, fibromuscular dysplasia or significant stenosis.  IMA: Patent without  evidence of aneurysm, dissection, vasculitis or significant stenosis.  Inflow: The suprarenal abdominal aorta measures 3.8 cm, image 124/7.  Veins: No obvious venous abnormality within the limitations of this arterial phase study.  Hepatobiliary: Hepatic steatosis identified. No focal liver abnormality identified.  Pancreas: No pancreatic inflammation or mass. No main duct dilatation.  Spleen: The spleen is normal in size and appearance.  Adrenals/Urinary Tract: Unremarkable appearance of the adrenal glands. Bilateral kidney cysts are identified. No suspicious mass or hydronephrosis identified.  Stomach/Bowel: The stomach is unremarkable. There is no bowel wall thickening, inflammation or distension.  Lymphatic: No abdominal adenopathy identified.  Musculoskeletal: Scoliosis and degenerative disc disease identified. No acute or suspicious bone findings.  Other: None  Review of the MIP images confirms the above findings.  IMPRESSION: 1. Stable appearance of thoracic aortic aneurysm. Recommend semi-annual imaging followup by CTA or MRA and referral to cardiothoracic surgery if not already obtained. This recommendation follows 2010 CF/AHA/AATS/ACR/ASA/SCA/SCAI/SIR/STS/SVM Guidelines for the Diagnosis and Management of Patients With Thoracic Aortic Disease. Circulation. 2010; 121: J031-R945. Aortic aneurysm NOS (ICD10-I71.9) 2. 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 Ectatic abdominal aorta at risk for aneurysm development. Recommend followup by ultrasound in 5  years. This recommendation follows ACR consensus guidelines: White Paper of the ACR Incidental Findings Committee II on Vascular Findings. J Am Coll Radiol 2013; 10:789-794. 3. Aortic atherosclerosis. Three vessel coronary artery calcifications noted. 4. Hepatic steatosis.  Aortic Atherosclerosis (ICD10-I70.0).   Electronically Signed   By: Kerby Moors M.D.   On: 07/14/2019  10:19 I personally reviewed the CT images and concur with the findings noted above.   Impression: [name] is a 68 year old woman with a past medical history significant for hypertension, hyperlipidemia, tobacco abuse, ILD, COPD, persistent atrial fibrillation, coronary atherosclerosis, thoracic aortic atherosclerosis, chronic diastolic congestive heart failure, and descending thoracic aortic aneurysms.  The aneurysms were first picked up on a CT in 2018 that was done to evaluate for ILD (NSIP).  They were reported to be significantly bigger on her CT in March 2020.  Now on follow-up the aneurysms are unchanged.  The measurements given today are less than the ones reported in March.  In any event there is no indication for surgery at this time but she does need continued follow-up.  She was noted to have ectasia of the infrarenal abdominal aorta measuring 2.5 cm.  Extensive thoracic and abdominal aortic atherosclerosis-she quit smoking 8 years ago, on Lipitor.  Hypertension-pressure is elevated today.  She says she was nervous about the scan results.  She is on diltiazem and does check her blood pressure at home.  I advised her to keep a close watch on that and make sure that the systolic stays less than 258 and ideally less than 130.  Plan: Return in 6 months with CT angio of chest  Melrose Nakayama, MD [facility] [phone]

## 2019-09-10 ENCOUNTER — Other Ambulatory Visit: Payer: Self-pay

## 2019-09-10 ENCOUNTER — Ambulatory Visit (HOSPITAL_COMMUNITY)
Admission: RE | Admit: 2019-09-10 | Discharge: 2019-09-10 | Disposition: A | Discharge status: Home / Self Care | Payer: Medicare Other | Source: Ambulatory Visit | Attending: Nurse Practitioner | Admitting: Nurse Practitioner

## 2019-09-10 ENCOUNTER — Encounter (HOSPITAL_COMMUNITY): Payer: Self-pay | Admitting: Nurse Practitioner

## 2019-09-10 VITALS — BP 164/86 | HR 73 | Ht 61.5 in | Wt 208.6 lb

## 2019-09-10 DIAGNOSIS — D6869 Other thrombophilia: Secondary | ICD-10-CM

## 2019-09-10 DIAGNOSIS — M1990 Unspecified osteoarthritis, unspecified site: Secondary | ICD-10-CM | POA: Diagnosis not present

## 2019-09-10 DIAGNOSIS — J439 Emphysema, unspecified: Secondary | ICD-10-CM | POA: Insufficient documentation

## 2019-09-10 DIAGNOSIS — Z8249 Family history of ischemic heart disease and other diseases of the circulatory system: Secondary | ICD-10-CM | POA: Diagnosis not present

## 2019-09-10 DIAGNOSIS — Z79899 Other long term (current) drug therapy: Secondary | ICD-10-CM | POA: Diagnosis not present

## 2019-09-10 DIAGNOSIS — Z87891 Personal history of nicotine dependence: Secondary | ICD-10-CM | POA: Diagnosis not present

## 2019-09-10 DIAGNOSIS — I2510 Atherosclerotic heart disease of native coronary artery without angina pectoris: Secondary | ICD-10-CM | POA: Insufficient documentation

## 2019-09-10 DIAGNOSIS — Z885 Allergy status to narcotic agent status: Secondary | ICD-10-CM | POA: Diagnosis not present

## 2019-09-10 DIAGNOSIS — Z7901 Long term (current) use of anticoagulants: Secondary | ICD-10-CM | POA: Insufficient documentation

## 2019-09-10 DIAGNOSIS — F419 Anxiety disorder, unspecified: Secondary | ICD-10-CM | POA: Diagnosis not present

## 2019-09-10 DIAGNOSIS — Z9981 Dependence on supplemental oxygen: Secondary | ICD-10-CM | POA: Diagnosis not present

## 2019-09-10 DIAGNOSIS — I4819 Other persistent atrial fibrillation: Secondary | ICD-10-CM | POA: Diagnosis present

## 2019-09-10 DIAGNOSIS — I5032 Chronic diastolic (congestive) heart failure: Secondary | ICD-10-CM | POA: Insufficient documentation

## 2019-09-10 DIAGNOSIS — Z881 Allergy status to other antibiotic agents status: Secondary | ICD-10-CM | POA: Insufficient documentation

## 2019-09-10 DIAGNOSIS — E7800 Pure hypercholesterolemia, unspecified: Secondary | ICD-10-CM | POA: Insufficient documentation

## 2019-09-10 DIAGNOSIS — I110 Hypertensive heart disease with heart failure: Secondary | ICD-10-CM | POA: Insufficient documentation

## 2019-09-10 LAB — BASIC METABOLIC PANEL
Anion gap: 10 (ref 5–15)
BUN: 20 mg/dL (ref 8–23)
CO2: 28 mmol/L (ref 22–32)
Calcium: 9.8 mg/dL (ref 8.9–10.3)
Chloride: 108 mmol/L (ref 98–111)
Creatinine, Ser: 0.94 mg/dL (ref 0.44–1.00)
GFR calc Af Amer: >60 mL/min (ref >60)
GFR calc non Af Amer: >60 mL/min (ref >60)
Glucose, Bld: 122 mg/dL — ABNORMAL HIGH (ref 70–99)
Potassium: 4.3 mmol/L (ref 3.5–5.1)
Sodium: 146 mmol/L — ABNORMAL HIGH (ref 135–145)

## 2019-09-10 LAB — MAGNESIUM: Magnesium: 2.3 mg/dL (ref 1.7–2.4)

## 2019-09-10 NOTE — Progress Notes (Signed)
Date:  09/10/2019   ID:  Vernetta Honey, DOB January 28, 1951, MRN [id number]  Location:  In office  Provider location: [address] [place], [place] [address] Evaluation Performed:  Follow up   PCP:  [name], MD  Primary Cardiologist:  Dr. [name] Primary Electrophysiologist: Dr. [name]   CC: afib/tikosyn surveillance   History of Present Illness: [name] is a 69 y.o. female who presents for a f/u tikosyn  visit today.   Pt reports that she is doing well without any afib and remains compliant with Tikosyn and  Eliquis. CHA2DS2VASc  Score of 3. No bleeding issues. Lungs are stable and continues on O2 via nasal cannula.   Today, she denies symptoms of palpitations, chest pain, shortness of breath, orthopnea, PND, lower extremity edema, claudication, dizziness, presyncope, syncope, bleeding, or neurologic sequela. The patient is tolerating medications without difficulties and is otherwise without complaint today.   she denies symptoms of cough, fevers, chills, or new SOB worrisome for COVID 19.    Atrial Fibrillation Risk Factors:  she does have symptoms or diagnosis of sleep apnea. she is compliant with BiPAPtherapy. she does not have a history of rheumatic fever. she does not have a history of alcohol use. The patient does not have a history of early familial atrial fibrillation or other arrhythmias.  she has a BMI of Body mass index is 38.78 kg/m.Marland Kitchen Filed Weights   09/10/19 0953  Weight: 94.6 kg    Past Medical History:  Diagnosis Date  . Chronic diastolic CHF (congestive heart failure) (HCC)   . Chronic respiratory failure (HCC)   . Coronary atherosclerosis    a. 2V by CT 10/2017.  . Emphysema of lung (HCC)   . Hypercholesterolemia   . Hypertension   . NSIP (nonspecific interstitial pneumonia) (HCC)   . Osteoarthritis   . Persistent atrial fibrillation (HCC)   . Thoracic aortic ectasia [facility])    Past Surgical History:  Procedure Laterality  Date  . CARDIOVERSION N/A 07/25/2016   Procedure: CARDIOVERSION;  Surgeon: Pricilla Riffle, MD;  Location: [facility] ENDOSCOPY;  Service: Cardiovascular;  Laterality: N/A;  . CARDIOVERSION N/A 09/14/2016   Procedure: CARDIOVERSION;  Surgeon: Hillis Range, MD;  Location: [facility] OR;  Service: Cardiovascular;  Laterality: N/A;  . CATARACT EXTRACTION Bilateral   . LEG SURGERY Right   . TEE WITHOUT CARDIOVERSION N/A 07/25/2016   Procedure: TRANSESOPHAGEAL ECHOCARDIOGRAM (TEE);  Surgeon: Pricilla Riffle, MD;  Location: [facility] ENDOSCOPY;  Service: Cardiovascular;  Laterality: N/A;     Current Outpatient Medications  Medication Sig Dispense Refill  . acetaminophen (TYLENOL) 500 MG tablet Take 500 mg by mouth every 6 (six) hours as needed for moderate pain.    Marland Kitchen ascorbic acid (VITAMIN C) 500 MG tablet Take 500 mg by mouth daily. Takes one tablet every other day    . atorvastatin (LIPITOR) 10 MG tablet Take 1 tablet (10 mg total) by mouth daily at 6 PM. 30 tablet 0  . brimonidine (ALPHAGAN P) 0.1 % SOLN as directed.    Marland Kitchen CARTIA XT 240 MG 24 hr capsule TAKE 1 CAPSULE BY MOUTH DAILY 90 capsule 3  . cholecalciferol (VITAMIN D3) 25 MCG (1000 UNIT) tablet Take 1,000 Units by mouth daily. Takes one tablet every other day    . dofetilide (TIKOSYN) 500 MCG capsule TAKE ONE CAPSULE BY MOUTH TWICE DAILY 60 capsule 6  . ELIQUIS 5 MG TABS tablet TAKE 1 TABLET BY MOUTH TWICE DAILY 180  tablet 1  . furosemide (LASIX) 40 MG tablet TAKE 1 TABLET(40 MG) BY MOUTH DAILY 90 tablet 2  . GINKGO BILOBA COMPLEX PO Take by mouth. Takes one tablet every other day    . HYDROcodone-acetaminophen (NORCO/VICODIN) 5-325 MG tablet Take 1 tablet by mouth every 6 (six) hours as needed for moderate pain.    Marland Kitchen latanoprost (XALATAN) 0.005 % ophthalmic solution Place 1 drop into both eyes at bedtime.    Marland Kitchen LORazepam (ATIVAN) 1 MG tablet TAKE 1 TABLET BY MOUTH AS NEEDED FOR ANXIETY  1  . Multiple Vitamin (MULTIVITAMIN) capsule Take 1 capsule by mouth daily.      . Multiple Vitamins-Minerals (HAIR SKIN NAILS PO) Take by mouth. Takes one tablet every other day    . OXYGEN 2lpm 24/7 and 4 lpm with exertion [facility]    . UNABLE TO FIND BIPAP with 2lpm o2 at night  [facility]     No current facility-administered medications for this encounter.    Allergies:   Levaquin [levofloxacin in d5w] and Percocet [oxycodone-acetaminophen]   Social History:  The patient  reports that she quit smoking about 9 years ago. Her smoking use included cigarettes. She started smoking about 53 years ago. She has a 43.00 pack-year smoking history. She has never used smokeless tobacco. She reports that she does not drink alcohol or use drugs.   Family History:  The patient's  family history includes Heart attack in her sister; Heart disease in her father.    ROS:  Please see the history of present illness.   All other systems are personally reviewed and negative.   Exam: GEN- The patient is well appearing, alert and oriented x 3 today.   Head- normocephalic, atraumatic Eyes-  Sclera clear, conjunctiva pink Ears- hearing intact Oropharynx- clear Neck- supple, no JVP Lymph- no cervical lymphadenopathy Lungs- Clear to ausculation bilaterally, normal work of breathing Heart- Regular rate and rhythm, no murmurs, rubs or gallops, PMI not laterally displaced GI- soft, NT, ND, + BS Extremities- no clubbing, cyanosis, or 1t pitting edema( improved) MS- no significant deformity or atrophy Skin- no rash or lesion Psych- euthymic mood, full affect Neuro- strength and sensation are intact      Recent Labs: 10/21/2018: BUN 16; Creatinine, Ser 0.89; Potassium 4.3; Sodium 139  personally reviewed    Other studies personally reviewed: ekg- NSR at 73 bpm, pr int 162 md, qrs int 82 ms, qtc 436 ms     ASSESSMENT AND PLAN:  1.  H/o persisitent atrial fibrillation Doing well maintaining SR on Tikosyn 500 mcg bid Continue Cardizem 240 mg a day Continue  eliquis 5 mg bid This  patients CHA2DS2-VASc Score and unadjusted Ischemic Stroke Rate (% per year) is equal to 3.2 % stroke rate/year from a score of 3  Above score calculated as 1 point each if present [CHF, HTN, DM, Vascular=MI/PAD/Aortic Plaque, Age if 65-74, or Female] Above score calculated as 2 points each if present [Age > 75, or Stroke/TIA/TE]  Bmet/mag today  Current medicines are reviewed at length with the patient today.   The patient does not have concerns regarding her medicines.  The following changes were made today:  none  Labs/ tests ordered today include: none Orders Placed This Encounter  Procedures  . Basic metabolic panel  . Magnesium  . EKG 12-Lead    Patient Risk:  after full review of this patients clinical status, I feel that they are at  Mod risk at this time.   Signed,  [name] NP 09/10/2019 11:00 AM  [facility] [address] [place], [place] [address] [phone]

## 2019-11-23 ENCOUNTER — Other Ambulatory Visit (HOSPITAL_COMMUNITY): Payer: Self-pay | Admitting: Nurse Practitioner

## 2019-12-06 ENCOUNTER — Other Ambulatory Visit: Payer: Self-pay | Admitting: *Deleted

## 2019-12-06 DIAGNOSIS — I712 Thoracic aortic aneurysm, without rupture, unspecified: Secondary | ICD-10-CM

## 2019-12-31 ENCOUNTER — Other Ambulatory Visit (HOSPITAL_COMMUNITY): Payer: Self-pay | Admitting: *Deleted

## 2019-12-31 MED ORDER — DOFETILIDE 500 MCG PO CAPS: 500.0000 ug | ORAL_CAPSULE | Freq: Two times a day (BID) | ORAL | 6 refills | Status: DC

## 2020-01-05 ENCOUNTER — Encounter: Payer: Self-pay | Admitting: Internal Medicine

## 2020-01-05 ENCOUNTER — Other Ambulatory Visit: Payer: Self-pay

## 2020-01-05 ENCOUNTER — Ambulatory Visit: Payer: Medicare Other | Admitting: Internal Medicine

## 2020-01-05 DIAGNOSIS — J9611 Chronic respiratory failure with hypoxia: Secondary | ICD-10-CM | POA: Diagnosis not present

## 2020-01-05 DIAGNOSIS — J9612 Chronic respiratory failure with hypercapnia: Secondary | ICD-10-CM | POA: Diagnosis not present

## 2020-01-05 DIAGNOSIS — J8410 Pulmonary fibrosis, unspecified: Secondary | ICD-10-CM | POA: Diagnosis not present

## 2020-01-05 NOTE — Assessment & Plan Note (Signed)
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 However, there does appear to be a mild craniocaudal gradient. The possibility of early usual interstitial pneumonia (UIP) although not favored, is not entirely excluded. Accordingly, repeat high-resolution chest CT is recommended in 12 months  - PFT's  03/06/2017  F VC  1.77 (63%)  no % improvement from saba p 0  prior to study with DLCO  67/66 % corrects to 99  % for alv volume   - 61mw  06/06/2017   304 m on 4lpm s stopping or desats - HRCT chest 11/13/17 1. 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 -  11/18/2017  HSP serology/ collagen vasc profile >  Neg  - PFT's  11/18/2017  FVC 1.71 (61%)  with DLCO  64/59c  % corrects to 94 % for alv volume  And ERV 34%  No clinical evidence of dz progression > no change rx

## 2020-01-05 NOTE — Assessment & Plan Note (Addendum)
HCO3  10/29/2016  = 32   Qualified for 02  10/29/16  Saturations on Room Air at Rest =87% - Patient Saturations on 2lpm o2= 93% - Sats walking on 4lpm 90% Started bipap per Dr Leonides Sake 11/29/16  - 12/05/2016  Patient Saturations on Room Air at Rest =88% Patient Saturations on 2lpm o2= 93% - sats ok 06/06/2017 on 6 mw on 4lpm  - HCO3 10/01/17 = 25 so hypercarbia clearly better  - 05/21/2018   [facility] RA  2 laps @ 185 ft each stopped due to desats to 86% corrected on 2lpm / slow pace  - 01/05/2020 RA 92% and on 3lpm POC sats 90% p 750 ft slow pace/ stopping last lap due to sob    rec as of 01/05/2020 = 2lpm hs with cpap / none at rest and  with activity up to 3lpm  POC  Adequate control on present rx, reviewed in detail with pt > no change in rx needed    Very concerned re risk of death in this setting from covid.  Discussed in detail all the  indications, usual  risks  relative to the benefits with patient  Re vaccination but declined for now.         Each maintenance medication was reviewed in detail including emphasizing most importantly the difference between maintenance and prns and under what circumstances the prns are to be triggered using an action plan format where appropriate.  Total time for H and P, chart review, counseling,  directly observing portions of ambulatory 02 saturation study/  and generating customized AVS unique to this office visit / charting = 30 min

## 2020-01-05 NOTE — Assessment & Plan Note (Signed)
Body mass index is 39.49 kg/m.  -  trending up  Lab Results  Component Value Date   TSH 1.66 10/29/2016     Contributing to gerd risk/ doe/reviewed the need and the process to achieve and maintain neg calorie balance > defer f/u primary care including intermittently monitoring thyroid status

## 2020-01-05 NOTE — Progress Notes (Signed)
Subjective:     Patient ID: [name], female   DOB: 11-Apr-1951,    MRN: [id number]    Brief patient profile:  50 yowf quit smoking 2012 due to some cough/wheezing improved p quit at wt 180 and no obst on spirometry 06/25/16  referred to pulmonary clinic 06/25/2016 by Dr   Shirline Frees s/p admit:   Admit date: 02/05/2016 Discharge date: 02/08/2016  Discharge Diagnoses:  Principal Problem:   Acute respiratory failure with hypoxia ([place]) Active Problems:   HTN (hypertension)   PNA (pneumonia)   Glaucoma      History of Present Illness  06/25/2016 1st [facility] office visit/ [name]   ? NSIP  Chief Complaint  Patient presents with  . Consult visit    COPD consult per [name]- Pt states SOB w/ excertion. No congestion or tightness. Occasional coughing at times.   onset of sob spring 2017 x one month prior to admit on 02/05/16 gradually worse while on lisinopril  In April 2017 was using [facility] parking but still able to do [employer]  Now sob across the room  Sob occurs at rest if speaking  Able to lie down rotated on to left side otherwise chokes/ smothers rec Stop lotensin and start ibesartan 150 mg one daily  And your breathing should gradually improve  Please schedule a follow up office visit in 2 weeks, sooner if needed to see [name] to recheck your blood pressure  Add double the lasix dose for now    10/29/2016 acute extended ov/[name] re: new onset resp failure/ pt maint on eliquis but no rsp rx  Chief Complaint  Patient presents with  . Acute Visit    Pt c/o increased SOB and lower o2 sats for the past wk.   gradually worse sob x one week assoc with low 02 sats though baseline doe = MMRC3 = can't walk 100 yards even at a slow pace at a flat grade s stopping due to sob   No sign cough/ no chills no cp or vomiting  Leg swelling no worse than usual  rec Please see patient coordinator before you leave today  to schedule start 02 24/7 at 2lpm and ambulatory 02  titration to see if eligible for POC  Wear 02 2lpm 24/7 for now at 2lpm and increase to 4lpm with exertion Use your incentive spirometer as much as you can  Please remember to go to the lab and x-ray department downstairs in the basement  for your tests - we will call you with the results when they are available. I will arrange follow up for you here if indicated, otherwise just return to cardiology as planned  Late add:  Needs hrct next p taking one extra lasix daily x one week > NSIP         03/06/2017  f/u ov/[name] re: NSIP/MO 02 dep 2lpm at rest/sleep and 4lpm poc with acitivity Chief Complaint  Patient presents with  . Follow-up    PFT's done today. Breathing is unchanged. She has not had to use her rescue inhaler.    no rx x for 02 at this point / main concern is gradually worsening dep edema since last ov refractory to lasix 80 mg daily  rec Stop potassium and start aldactone (spironolactone) 25 mg twice daily with lasix       11/18/2017  f/u ov/[name] re:  ?NSIP last bird exp 2-3 years  02 2lpm bipap and 2lpm with activity  Chief  Complaint  Patient presents with  . Follow-up    PFT's done today. Breathing is unchanged.   Dyspnea:  Still shopping at [employer]/ wearing 2lpm with activity  Cough: none Sleep: sleep ok on cpap/ 02 SABA use:  n/a [name]  Now following for Cards rec Avoid bird exposure  I will let [name] know about your Aortic enlargement but the main treatment is to lose weight and keep your blood pressure down     05/21/2018  f/u ov/[name] re:  ? 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 Followup    Breathing is doing well and no new co's.   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 Follow-up     Breathing is unchanged since the last visit. No new co's.   Dyspnea:  10-15 min walking s stopping on on 3lpm with sats still lower 90s Cough: minimal Sleeping: bipap / flat on back 1 pillow  SABA use: none  rec No change   01/05/2020  f/u ov/[name] re: nsip / paf / does not want vaccine "afraid of it from what I've heard")  Chief Complaint  Patient presents with  . Annual Exam    some coughing in AM, some phlem comes up, some SOB with walking   Dyspnea:  Walking outside and down the street and [place] most of the time on 3lpm  POC  Cough: mostly in am  Sleeping: on back one pillow and bipap from sleep doctor [name]  SABA use: none 02: 2lpm at hs/  None at rest and 3 when active    No obvious day to day or daytime variability or assoc purulent sputum or mucus plugs or hemoptysis or cp or chest tightness, subjective wheeze or overt sinus or hb symptoms.   Sleeping as above  without nocturnal  or early am exacerbation  of respiratory  c/o's or need for noct saba. Also denies any obvious fluctuation of symptoms with weather or environmental changes or other aggravating or alleviating factors except as outlined above   No unusual exposure hx or h/o childhood pna/ asthma or knowledge of premature birth.  Current Allergies, Complete Past Medical History, Past Surgical History, Family History, and Social History were reviewed in [employer] record.  ROS  The following are not active complaints unless bolded Hoarseness, sore throat, dysphagia, dental problems, itching, sneezing,  nasal congestion or discharge of excess mucus or purulent secretions, ear ache,   fever, chills, sweats, unintended wt loss or wt gain, classically pleuritic or exertional cp,  orthopnea pnd or arm/hand swelling  or leg swelling, presyncope, palpitations, abdominal pain, anorexia, nausea, vomiting, diarrhea  or change in bowel habits or change in bladder habits, change in stools or change in  urine, dysuria, hematuria,  rash, arthralgias, visual complaints, headache, numbness, weakness or ataxia or problems with walking or coordination,  change in mood or  memory.        Current Meds  Medication Sig  . acetaminophen (TYLENOL) 500 MG tablet Take 500 mg by mouth every 6 (six) hours as needed for moderate pain.  Marland Kitchen ascorbic acid (  VITAMIN C) 500 MG tablet Take 500 mg by mouth daily. Takes one tablet every other day  . atorvastatin (LIPITOR) 10 MG tablet Take 1 tablet (10 mg total) by mouth daily at 6 PM.  . brimonidine (ALPHAGAN P) 0.1 % SOLN as directed.  Marland Kitchen CARTIA XT 240 MG 24 hr capsule TAKE 1 CAPSULE BY MOUTH DAILY  . cholecalciferol (VITAMIN D3) 25 MCG (1000 UNIT) tablet Take 1,000 Units by mouth daily. Takes one tablet every other day  . ELIQUIS 5 MG TABS tablet TAKE 1 TABLET BY MOUTH TWICE DAILY  . furosemide (LASIX) 40 MG tablet TAKE 1 TABLET(40 MG) BY MOUTH DAILY  . GINKGO BILOBA COMPLEX PO Take by mouth. Takes one tablet every other day  . HYDROcodone-acetaminophen (NORCO/VICODIN) 5-325 MG tablet Take 1 tablet by mouth every 6 (six) hours as needed for moderate pain.  Marland Kitchen latanoprost (XALATAN) 0.005 % ophthalmic solution Place 1 drop into both eyes at bedtime.  Marland Kitchen LORazepam (ATIVAN) 1 MG tablet TAKE 1 TABLET BY MOUTH AS NEEDED FOR ANXIETY  . Multiple Vitamin (MULTIVITAMIN) capsule Take 1 capsule by mouth daily.  . Multiple Vitamins-Minerals (HAIR SKIN NAILS PO) Take by mouth. Takes one tablet every other day  . OXYGEN 2lpm 24/7 and 4 lpm with exertion [facility]  . UNABLE TO FIND BIPAP with 2lpm o2 at night  [facility]            Objective:   Physical Exam   Obese somber  wf nad  01/05/2020         202  01/06/2019         193  05/21/2018         190  11/18/2017         198  06/06/2017       200  .03/06/2017        204  12/05/2016         203   07/23/2016     208   06/25/16 204 lb 1.6 oz (92.6 kg)  02/06/16 204 lb 9.4 oz (92.8 kg)  02/25/08 163 lb (73.9 kg)    Vital signs  reviewed  01/05/2020  - Note at rest 02 sats  94% on 2lpm  And RA  92%    HEENT : pt wearing mask not removed for exam due to covid -19 concerns.    NECK :  without JVD/Nodes/TM/ nl carotid upstrokes bilaterally   LUNGS: no acc muscle use,  Nl contour chest with coarse bs bilaterally without cough on insp or exp maneuvers  CV:  RRR  no s3 or murmur or increase in P2, and  trace pitting lower Ext  L > R   ABD:  soft and nontender with nl inspiratory excursion in the supine position. No bruits or organomegaly appreciated, bowel sounds nl  MS:  slow gait/ ext warm without deformities, calf tenderness, cyanosis or clubbing No obvious joint restrictions   SKIN: warm and dry without lesions    NEURO:  alert, approp, nl sensorium with  no motor or cerebellar deficits apparent.            Assessment:

## 2020-01-05 NOTE — Patient Instructions (Addendum)
No change in 02 sleeping and not needed sitting.  Most of the time 3lpm should be fine on your portable   However, make sure you check your oxygen saturations occasionally at highest level of activity to be sure it stays over 90% and adjust upward to maintain this level if needed but remember to turn it back to previous settings when you stop (to conserve your supply) = back to zero flow.  If losing ground with your activities or not getting adequate (over 90% saturations) please call for early follow up.  I strongly recommend you reconsider the Covid vaccine, especially the pfizer or moderna whichever is being offered.   Please schedule a follow up visit in 6 months but call sooner if needed

## 2020-01-06 ENCOUNTER — Ambulatory Visit
Admission: RE | Admit: 2020-01-06 | Discharge: 2020-01-06 | Disposition: A | Discharge status: Home / Self Care | Payer: Medicare Other | Source: Ambulatory Visit | Attending: Thoracic Surgery (Cardiothoracic Vascular Surgery) | Admitting: Thoracic Surgery (Cardiothoracic Vascular Surgery)

## 2020-01-06 DIAGNOSIS — I712 Thoracic aortic aneurysm, without rupture, unspecified: Secondary | ICD-10-CM

## 2020-01-06 MED ORDER — IOPAMIDOL (ISOVUE-370) INJECTION 76%
75.0000 mL | Freq: Once | INTRAVENOUS | Status: AC | PRN
  Administered 2020-01-06: 75 mL via INTRAVENOUS

## 2020-01-07 ENCOUNTER — Ambulatory Visit: Payer: Medicare Other | Admitting: Internal Medicine

## 2020-01-11 ENCOUNTER — Encounter: Payer: Self-pay | Admitting: Thoracic Surgery (Cardiothoracic Vascular Surgery)

## 2020-01-11 ENCOUNTER — Other Ambulatory Visit: Payer: Self-pay

## 2020-01-11 ENCOUNTER — Ambulatory Visit: Payer: Medicare Other | Admitting: Thoracic Surgery (Cardiothoracic Vascular Surgery)

## 2020-01-11 VITALS — BP 125/81 | HR 79 | Temp 98.2°F | Resp 24 | Ht 60.0 in | Wt 208.0 lb

## 2020-01-11 DIAGNOSIS — I712 Thoracic aortic aneurysm, without rupture, unspecified: Secondary | ICD-10-CM

## 2020-01-11 NOTE — Progress Notes (Signed)
[address]       [name] [address]             [phone]     HPI: [name] returns for a scheduled follow-up visit  [name] is a 69 year old woman with a complicated medical history.  Her past medical history is significant for hypertension, hyperlipidemia, tobacco abuse, ILD, COPD, oxygen dependent chronic respiratory failure, persistent atrial fibrillation, coronary atherosclerosis, chronic diastolic heart failure, thoracic aortic atherosclerosis, and descending thoracic aortic aneurysms.  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  She has been followed with CT scans for that.  There is date back to at least 2018.  I have been following her since last year.  I last saw her in November 2020.  At that time the aneurysms were stable.  Her blood pressure was well controlled.  In the interim since her last visit she has been feeling well.  She has chronic dyspnea but that has been stable and has not worsened any.  She is not having any chest pain, pressure, or tightness.  She has not had any unusual back pain.  She does get some swelling in her legs particularly as the day goes along.  That is not new.  Past Medical History:  Diagnosis Date  . Chronic diastolic CHF (congestive heart failure) (HCC)   . Chronic respiratory failure (HCC)   . Coronary atherosclerosis    a. 2V by CT 10/2017.  . Emphysema of lung (HCC)   . Hypercholesterolemia   . Hypertension   . NSIP (nonspecific interstitial pneumonia) (HCC)   . Osteoarthritis   . Persistent atrial fibrillation (HCC)   . Thoracic aortic ectasia (HCC)     Current Outpatient Medications  Medication Sig Dispense Refill  . acetaminophen (TYLENOL) 500 MG tablet Take 500 mg by mouth every 6 (six) hours as needed for moderate pain.    Marland Kitchen ascorbic acid (VITAMIN C) 500 MG tablet Take 500 mg by mouth daily. Takes one tablet every other day    . atorvastatin (LIPITOR) 10 MG tablet Take 1 tablet (10 mg total) by mouth  daily at 6 PM. 30 tablet 0  . brimonidine (ALPHAGAN P) 0.1 % SOLN as directed.    Marland Kitchen CARTIA XT 240 MG 24 hr capsule TAKE 1 CAPSULE BY MOUTH DAILY 90 capsule 3  . cholecalciferol (VITAMIN D3) 25 MCG (1000 UNIT) tablet Take 1,000 Units by mouth daily. Takes one tablet every other day    . dofetilide (TIKOSYN) 500 MCG capsule Take 1 capsule (500 mcg total) by mouth 2 (two) times daily. 60 capsule 6  . ELIQUIS 5 MG TABS tablet TAKE 1 TABLET BY MOUTH TWICE DAILY 180 tablet 1  . furosemide (LASIX) 40 MG tablet TAKE 1 TABLET(40 MG) BY MOUTH DAILY 90 tablet 2  . GINKGO BILOBA COMPLEX PO Take by mouth. Takes one tablet every other day    . HYDROcodone-acetaminophen (NORCO/VICODIN) 5-325 MG tablet Take 1 tablet by mouth every 6 (six) hours as needed for moderate pain.    Marland Kitchen latanoprost (XALATAN) 0.005 % ophthalmic solution Place 1 drop into both eyes at bedtime.    Marland Kitchen LORazepam (ATIVAN) 1 MG tablet TAKE 1 TABLET BY MOUTH AS NEEDED FOR ANXIETY  1  . Multiple Vitamin (MULTIVITAMIN) capsule Take 1 capsule by mouth daily.    . Multiple Vitamins-Minerals (HAIR SKIN NAILS PO) Take by mouth. Takes one tablet every other day    . OXYGEN 2lpm 24/7  and 4 lpm with exertion [facility]    . UNABLE TO FIND BIPAP with 2lpm o2 at night  [facility]     No current facility-administered medications for this visit.    Physical Exam BP 125/81 (BP Location: Right Arm)   Pulse 79   Temp 98.2 F (36.8 C) (Temporal)   Resp (!) 24   Ht 5' (1.524 m)   Wt 208 lb (94.3 kg)   SpO2 94% Comment: on O2 @ 2 LPM via nasal cannula  BMI 40.17 kg/m  69 year old woman in no acute distress Alert and oriented x3 with no focal deficits No carotid bruits Cardiac irregular rate and rhythm Lungs faint crackles at bases Extremities with trace edema  Diagnostic Tests: CT ANGIOGRAPHY CHEST WITH CONTRAST  TECHNIQUE: Multidetector CT imaging of the chest was performed using the standard protocol during bolus administration of intravenous  contrast. Multiplanar CT image reconstructions and MIPs were obtained to evaluate the vascular anatomy.  CONTRAST:  60mL ISOVUE-370 IOPAMIDOL (ISOVUE-370) INJECTION 76%  COMPARISON:  07/14/2019 and previous  FINDINGS: Cardiovascular: Heart size normal. No pericardial effusion. Satisfactory opacification of pulmonary arteries noted, and there is no evidence of pulmonary emboli. Scattered coronary calcifications. Good contrast opacification of the thoracic aorta.  Aortic Root:  --Valve: 2.7 cm  --Sinuses: 4.3 cm  --Sinotubular Junction: 3.3 cm  Limitations by motion: Mild  Thoracic Aorta:  --Ascending Aorta: 3.6 cm  --Aortic Arch: 3 cm  --Descending Aorta: 4.3 cm in its midportion (image 132/12, previously 4.1), 4 cm at the diaphragm  Other:  Stable 8 mm penetrating atheromatous ulcer in the distal arch image 39/5. Moderate calcified atheromatous plaque in the arch and descending thoracic segment. Visualized proximal abdominal aorta is mildly atheromatous, normal in caliber.  Mediastinum/Nodes: No hilar or mediastinal adenopathy.  Lungs/Pleura: No pleural effusion. No pneumothorax. Some increase in coarse subpleural opacities in ground-glass opacities in the basilar segments of both lower lobes with some peripheral septal thickening. 3 mm left apical nodule stable.  Upper Abdomen: Probable renal cyst, incompletely characterized. No acute findings.  Musculoskeletal: Mild thoracic levoscoliosis apex T11. Anterior vertebral endplate spurring at multiple levels in the lower thoracic spine.  Review of the MIP images confirms the above findings.  IMPRESSION: 1. 4.3 cm fusiform aneurysm of the mid descending thoracic aorta, previously 4.1. Recommend semi-annual imaging followup by CTA or MRA and referral to cardiothoracic surgery if not already obtained. This recommendation follows 2010 ACCF/AHA/AATS/ACR/ASA/SCA/SCAI/SIR/STS/SVM Guidelines for  the Diagnosis and Management of Patients With Thoracic Aortic Disease. Circulation. 2010; 121: Y865-H84. Aortic aneurysm NOS (ICD10-I71.9) 2. Coronary and Aortic Atherosclerosis (ICD10-170.0).   Electronically Signed   By: Lucrezia Europe M.D.   On: 01/06/2020 13:35 I personally reviewed the CT images and concur with the findings noted above  Impression: [name] is a 69 year old woman with multiple medical issues including hypertension, hyperlipidemia, tobacco abuse, ILD, COPD, oxygen dependent chronic respiratory failure, persistent atrial fibrillation, coronary atherosclerosis, chronic diastolic heart failure, thoracic aortic atherosclerosis, and descending thoracic aortic aneurysms.  Thoracic aortic atherosclerosis/descending thoracic aneurysms-she has 2 aneurysm of the descending aorta.  The first is in the proximal descending aorta.  That measured slightly larger on the CT today.  Looking at the films I think it may have been measured in a slightly different location but overall probably is 1 to 2 mm larger.  The second aneurysm is more distal near the diaphragm.  It is unchanged.  She needs continued semiannual follow-up.  Hypertension-blood pressure slightly elevated at 144 today.  She says that she is checking on a regular basis at home.  I recommended that she be sure to check it on a regular basis and we want that systolic pressure less than 140 at a minimum and ideally less than 130.  ILD/NSIP-remains on home oxygen.  Stable  Plan: Check blood pressure at home on a regular basis Return in 6 months with CT angio of chest  Loreli Slot, MD [facility] [phone]

## 2020-02-02 ENCOUNTER — Other Ambulatory Visit: Payer: Self-pay | Admitting: Interventional Cardiology

## 2020-02-03 MED ORDER — DILTIAZEM HCL ER COATED BEADS 240 MG PO CP24: 240.0000 mg | ORAL_CAPSULE | Freq: Every day | ORAL | 0 refills | Status: DC

## 2020-02-21 ENCOUNTER — Other Ambulatory Visit: Payer: Self-pay | Admitting: Interventional Cardiology

## 2020-02-29 ENCOUNTER — Telehealth: Payer: Self-pay | Admitting: Interventional Cardiology

## 2020-02-29 MED ORDER — DILTIAZEM HCL ER COATED BEADS 240 MG PO CP24: 240.0000 mg | ORAL_CAPSULE | Freq: Every day | ORAL | 0 refills | Status: DC

## 2020-02-29 NOTE — Telephone Encounter (Signed)
   *  STAT* If patient is at the pharmacy, call can be transferred to refill team.   1. Which medications need to be refilled? (please list name of each medication and dose if known)  diltiazem (CARTIA XT) 240 MG 24 hr capsule   2. Which pharmacy/location (including street and city if local pharmacy) is medication to be sent to? [facility] Phone# [phone]  3. Do they need a 30 day or 90 day supply? 90 days

## 2020-02-29 NOTE — Telephone Encounter (Signed)
Pt's medication was sent to pt's pharmacy as requested. Confirmation received.  °

## 2020-03-01 NOTE — Progress Notes (Signed)
Virtual Visit via Telephone Note   This visit type was conducted due to national recommendations for restrictions regarding the COVID-19 Pandemic (e.g. social distancing) in an effort to limit this patient's exposure and mitigate transmission in our community.  Due to her co-morbid illnesses, this patient is at least at moderate risk for complications without adequate follow up.  This format is felt to be most appropriate for this patient at this time.  The patient did not have access to video technology/had technical difficulties with video requiring transitioning to audio format only (telephone).  All issues noted in this document were discussed and addressed.  No physical exam could be performed with this format.  Please refer to the patient's chart for her  consent to telehealth for [facility].   The patient was identified using 2 identifiers.  Date:  03/02/2020   ID:  [name], DOB 1950/12/16, MRN [id number]  Patient Location: Home Provider Location: Office/Clinic  PCP:  [name], MD  Cardiologist:  Lance Muss, MD  Electrophysiologist:  None   Evaluation Performed:  Follow-Up Visit  Chief Complaint:  AFib  History of Present Illness:    [name] is a 69 y.o. female with with a past medical history significant for hypertension, hypercholesterolemia, tobacco abuse (quit 2012), interstitial lung disease (NSIP), COPD, chronic respiratoryfailure, persistent atrial fibrillation, coronary atherosclerosis, chronic diastolic congestive heart failure, and osteoarthritis.  In the past, it was noted that: "She wears oxygen continuously, typically.  She sometimes removes it if she is at rest for a while and sats are above 90%."  An irregular heartbeat was detecetedin 2017. ECG was done showing atrial fibrillation in 11/17. She was admittted with CHF as well at that time. She had a TEE/CV. She went back to AFib that same night.  Eventually started on  Tikosyn and followed in AFib clinic.  TOlerated Tikosyn well.    3/19 CT scan showed 2 vessel atherosclerosis and mildly dilated thoracic aneurysm. No anginal sx.  She is followed by Dr. Sherene Sires for NSIP, emphysema. She was found to have ananeurysm of the descending thoracic aorta on a high resolution CT for ILD. She recently had a CT to follow-up the aortic aneurysm and it was noted to be larger by report.  Followed by Dr. Dorris Fetch for aneurysm.  Since the last visit, her oxygen sats have been controlled.  95% on Oxygen at rest today.  87% off of oxygen after walking a short distance.  She checks regularly.   Denies : Chest pain. Dizziness. Leg edema. Nitroglycerin use. Orthopnea. Palpitations. Paroxysmal nocturnal dyspnea.  Syncope.   The patient does not have symptoms concerning for COVID-19 infection (fever, chills, cough, or new shortness of breath).    Past Medical History:  Diagnosis Date   Chronic diastolic CHF (congestive heart failure) (HCC)    Chronic respiratory failure (HCC)    Coronary atherosclerosis    a. 2V by CT 10/2017.   Emphysema of lung (HCC)    Hypercholesterolemia    Hypertension    NSIP (nonspecific interstitial pneumonia) (HCC)    Osteoarthritis    Persistent atrial fibrillation (HCC)    Thoracic aortic ectasia [name])    Past Surgical History:  Procedure Laterality Date   CARDIOVERSION N/A 07/25/2016   Procedure: CARDIOVERSION;  Surgeon: Pricilla Riffle, MD;  Location: [facility] ENDOSCOPY;  Service: Cardiovascular;  Laterality: N/A;   CARDIOVERSION N/A 09/14/2016   Procedure: CARDIOVERSION;  Surgeon: Hillis Range, MD;  Location: [facility] OR;  Service: Cardiovascular;  Laterality: N/A;   CATARACT EXTRACTION Bilateral    LEG SURGERY Right    TEE WITHOUT CARDIOVERSION N/A 07/25/2016   Procedure: TRANSESOPHAGEAL ECHOCARDIOGRAM (TEE);  Surgeon: Pricilla Riffle, MD;  Location: [facility] ENDOSCOPY;  Service: Cardiovascular;  Laterality: N/A;     Current Meds   Medication Sig   acetaminophen (TYLENOL) 500 MG tablet Take 500 mg by mouth every 6 (six) hours as needed for moderate pain.   ascorbic acid (VITAMIN C) 500 MG tablet Take 500 mg by mouth daily. Takes one tablet every other day   atorvastatin (LIPITOR) 20 MG tablet SMARTSIG:1 Tablet(s) By Mouth Every Evening   brimonidine (ALPHAGAN P) 0.1 % SOLN as directed.   cholecalciferol (VITAMIN D3) 25 MCG (1000 UNIT) tablet Take 1,000 Units by mouth daily. Takes one tablet every other day   diltiazem (CARTIA XT) 240 MG 24 hr capsule Take 1 capsule (240 mg total) by mouth daily. Please keep upcoming appt in July before anymore refills. Thank you   dofetilide (TIKOSYN) 500 MCG capsule Take 1 capsule (500 mcg total) by mouth 2 (two) times daily.   ELIQUIS 5 MG TABS tablet TAKE 1 TABLET BY MOUTH TWICE DAILY   furosemide (LASIX) 40 MG tablet Take 1 tablet (40 mg total) by mouth daily. MUST SCH FOLLOW UP APPT FOR FURTHER REFILLS 1ST ATTEMPT   GINKGO BILOBA COMPLEX PO Take by mouth. Takes one tablet every other day   HYDROcodone-acetaminophen (NORCO/VICODIN) 5-325 MG tablet Take 1 tablet by mouth every 6 (six) hours as needed for moderate pain.   latanoprost (XALATAN) 0.005 % ophthalmic solution Place 1 drop into both eyes at bedtime.   LORazepam (ATIVAN) 1 MG tablet TAKE 1 TABLET BY MOUTH AS NEEDED FOR ANXIETY   Multiple Vitamin (MULTIVITAMIN) capsule Take 1 capsule by mouth daily.   Multiple Vitamins-Minerals (HAIR SKIN NAILS PO) Take by mouth. Takes one tablet every other day   OXYGEN 2lpm 24/7 and 4 lpm with exertion [facility]   UNABLE TO FIND BIPAP with 2lpm o2 at night  [facility]   [DISCONTINUED] atorvastatin (LIPITOR) 10 MG tablet Take 1 tablet (10 mg total) by mouth daily at 6 PM.     Allergies:   Levaquin [levofloxacin in d5w] and Percocet [oxycodone-acetaminophen]   Social History   Tobacco Use   Smoking status: Former Smoker    Packs/day: 1.00    Years: 43.00    Pack years: 43.00     Types: Cigarettes    Start date: 08/26/1966    Quit date: 08/26/2010    Years since quitting: 9.5   Smokeless tobacco: Never Used  Vaping Use   Vaping Use: Never used  Substance Use Topics   Alcohol use: No    Alcohol/week: 0.0 standard drinks   Drug use: No     Family Hx: The patient's family history includes Heart attack in her sister; Heart disease in her father. There is no history of Stroke.  ROS:   Please see the history of present illness.    Chronic DOE, occasional left ankle swelling All other systems reviewed and are negative.   Prior CV studies:   The following studies were reviewed today:    Labs/Other Tests and Data Reviewed:    EKG:  An ECG dated Jan 2021 was personally reviewed today and demonstrated:  NSR, no ST changes  Recent Labs: 09/10/2019: BUN 20; Creatinine, Ser 0.94; Magnesium 2.3; Potassium 4.3; Sodium 146   Recent Lipid Panel Lab Results  Component Value Date/Time   CHOL  205 (H) 07/24/2016 12:44 AM   TRIG 50 07/24/2016 12:44 AM   HDL 64 07/24/2016 12:44 AM   CHOLHDL 3.2 07/24/2016 12:44 AM   LDLCALC 131 (H) 07/24/2016 12:44 AM    Wt Readings from Last 3 Encounters:  03/02/20 206 lb (93.4 kg)  01/11/20 208 lb (94.3 kg)  01/05/20 202 lb 3.2 oz (91.7 kg)     Objective:    Vital Signs:  BP 125/75    Pulse 79    Ht 5' (1.524 m)    Wt 206 lb (93.4 kg)    BMI 40.23 kg/m    VITAL SIGNS:  reviewed GEN:  no acute distress RESPIRATORY:  not short of breath NEURO:  alert and oriented x 3, no obvious focal deficit PSYCH:  normal affect exam limited by phone format  ASSESSMENT & PLAN:    1. AFib: On Tikosyn to maintain NSR.  Eliquis for stroke prevention.  She has f/u in AFib clinic. 2. Chronic diastolic heart failure: No signs of volume overload from what she describes.  3. Anticoagulated: No bleeding problems.  4. COPD: Using oxygen.  Still has not had COVID vaccines. 5. Coronary calcium by CT scan: No clear angina.  LDL 82 in 1/21.   Continue atorvastatin.  6. THoracic aortic aneurysm.  Follows with Dr. Dorris Fetch.  4.3 cm in 5/21 by CT.  Two aneurysm, per Dr. Dorris Fetch note.   COVID-19 Education: The signs and symptoms of COVID-19 were discussed with the patient and how to seek care for testing (follow up with PCP or arrange E-visit).  The importance of social distancing was discussed today.  Time:   Today, I have spent 15 minutes with the patient with telehealth technology discussing the above problems.     Medication Adjustments/Labs and Tests Ordered: Current medicines are reviewed at length with the patient today.  Concerns regarding medicines are outlined above.   Tests Ordered: No orders of the defined types were placed in this encounter.   Medication Changes: No orders of the defined types were placed in this encounter.   Follow Up:  In Person in 1 year(s)  Signed, Lance Muss, MD  03/02/2020 3:47 PM    [facility]

## 2020-03-02 ENCOUNTER — Other Ambulatory Visit: Payer: Self-pay

## 2020-03-02 ENCOUNTER — Encounter: Payer: Self-pay | Admitting: Interventional Cardiology

## 2020-03-02 ENCOUNTER — Telehealth: Payer: Self-pay

## 2020-03-02 ENCOUNTER — Telehealth (INDEPENDENT_AMBULATORY_CARE_PROVIDER_SITE_OTHER): Payer: Medicare Other | Admitting: Interventional Cardiology

## 2020-03-02 VITALS — BP 125/75 | HR 79 | Ht 60.0 in | Wt 206.0 lb

## 2020-03-02 DIAGNOSIS — E782 Mixed hyperlipidemia: Secondary | ICD-10-CM

## 2020-03-02 DIAGNOSIS — I5032 Chronic diastolic (congestive) heart failure: Secondary | ICD-10-CM

## 2020-03-02 DIAGNOSIS — I2510 Atherosclerotic heart disease of native coronary artery without angina pectoris: Secondary | ICD-10-CM

## 2020-03-02 DIAGNOSIS — I2584 Coronary atherosclerosis due to calcified coronary lesion: Secondary | ICD-10-CM

## 2020-03-02 DIAGNOSIS — I77810 Thoracic aortic ectasia: Secondary | ICD-10-CM | POA: Diagnosis not present

## 2020-03-02 DIAGNOSIS — I4819 Other persistent atrial fibrillation: Secondary | ICD-10-CM

## 2020-03-02 DIAGNOSIS — Z7901 Long term (current) use of anticoagulants: Secondary | ICD-10-CM

## 2020-03-02 NOTE — Telephone Encounter (Signed)
  Patient Consent for Virtual Visit         [name] has provided verbal consent on 03/02/2020 for a virtual visit (video or telephone).   CONSENT FOR VIRTUAL VISIT FOR:  [name]  By participating in this virtual visit I agree to the following:  I hereby voluntarily request, consent and authorize [facility] and its employed or contracted physicians, physician assistants, nurse practitioners or other licensed health care professionals (the Practitioner), to provide me with telemedicine health care services (the "Services") as deemed necessary by the treating Practitioner. I acknowledge and consent to receive the Services by the Practitioner via telemedicine. I understand that the telemedicine visit will involve communicating with the Practitioner through live audiovisual communication technology and the disclosure of certain medical information by electronic transmission. I acknowledge that I have been given the opportunity to request an in-person assessment or other available alternative prior to the telemedicine visit and am voluntarily participating in the telemedicine visit.  I understand that I have the right to withhold or withdraw my consent to the use of telemedicine in the course of my care at any time, without affecting my right to future care or treatment, and that the Practitioner or I may terminate the telemedicine visit at any time. I understand that I have the right to inspect all information obtained and/or recorded in the course of the telemedicine visit and may receive copies of available information for a reasonable fee.  I understand that some of the potential risks of receiving the Services via telemedicine include:  Marland Kitchen Delay or interruption in medical evaluation due to technological equipment failure or disruption; . Information transmitted may not be sufficient (e.g. poor resolution of images) to allow for appropriate medical decision making by the Practitioner;  and/or  . In rare instances, security protocols could fail, causing a breach of personal health information.  Furthermore, I acknowledge that it is my responsibility to provide information about my medical history, conditions and care that is complete and accurate to the best of my ability. I acknowledge that Practitioner's advice, recommendations, and/or decision may be based on factors not within their control, such as incomplete or inaccurate data provided by me or distortions of diagnostic images or specimens that may result from electronic transmissions. I understand that the practice of medicine is not an exact science and that Practitioner makes no warranties or guarantees regarding treatment outcomes. I acknowledge that a copy of this consent can be made available to me via my patient portal [facility] MyChart), or I can request a printed copy by calling the office of [facility].    I understand that my insurance will be billed for this visit.   I have read or had this consent read to me. . I understand the contents of this consent, which adequately explains the benefits and risks of the Services being provided via telemedicine.  . I have been provided ample opportunity to ask questions regarding this consent and the Services and have had my questions answered to my satisfaction. . I give my informed consent for the services to be provided through the use of telemedicine in my medical care

## 2020-03-02 NOTE — Patient Instructions (Signed)
Medication Instructions:  Your physician recommends that you continue on your current medications as directed. Please refer to the Current Medication list given to you today.  *If you need a refill on your cardiac medications before your next appointment, please call your pharmacy*   Lab Work: NONE If you have labs (blood work) drawn today and your tests are completely normal, you will receive your results only by: MyChart Message (if you have MyChart) OR A paper copy in the mail If you have any lab test that is abnormal or we need to change your treatment, we will call you to review the results.   Testing/Procedures: NONE   Follow-Up: At [facility], you and your health needs are our priority.  As part of our continuing mission to provide you with exceptional heart care, we have created designated Provider Care Teams.  These Care Teams include your primary Cardiologist (physician) and Advanced Practice Providers (APPs -  Physician Assistants and Nurse Practitioners) who all work together to provide you with the care you need, when you need it.  We recommend signing up for the patient portal called "MyChart".  Sign up information is provided on this After Visit Summary.  MyChart is used to connect with patients for Virtual Visits (Telemedicine).  Patients are able to view lab/test results, encounter notes, upcoming appointments, etc.  Non-urgent messages can be sent to your provider as well.   To learn more about what you can do with MyChart, go to [email or web address].    Your next appointment:   12 month(s)  The format for your next appointment:   In Person  Provider:   You may see Jayadeep [name], MD or one of the following Advanced Practice Providers on your designated Care Team:   Dayna Dunn, PA-C Michele Lenze, PA-C   

## 2020-03-14 ENCOUNTER — Encounter (HOSPITAL_COMMUNITY): Payer: Self-pay | Admitting: Nurse Practitioner

## 2020-03-14 ENCOUNTER — Ambulatory Visit (HOSPITAL_COMMUNITY)
Admission: RE | Admit: 2020-03-14 | Discharge: 2020-03-14 | Disposition: A | Discharge status: Home / Self Care | Payer: Medicare Other | Source: Ambulatory Visit | Attending: Nurse Practitioner | Admitting: Nurse Practitioner

## 2020-03-14 ENCOUNTER — Other Ambulatory Visit: Payer: Self-pay

## 2020-03-14 VITALS — BP 148/98 | HR 93 | Ht 60.0 in | Wt 207.4 lb

## 2020-03-14 DIAGNOSIS — Z6841 Body Mass Index (BMI) 40.0 and over, adult: Secondary | ICD-10-CM | POA: Insufficient documentation

## 2020-03-14 DIAGNOSIS — Z881 Allergy status to other antibiotic agents status: Secondary | ICD-10-CM | POA: Diagnosis not present

## 2020-03-14 DIAGNOSIS — Z87891 Personal history of nicotine dependence: Secondary | ICD-10-CM | POA: Diagnosis not present

## 2020-03-14 DIAGNOSIS — Z9989 Dependence on other enabling machines and devices: Secondary | ICD-10-CM | POA: Diagnosis not present

## 2020-03-14 DIAGNOSIS — M1990 Unspecified osteoarthritis, unspecified site: Secondary | ICD-10-CM | POA: Diagnosis not present

## 2020-03-14 DIAGNOSIS — I110 Hypertensive heart disease with heart failure: Secondary | ICD-10-CM | POA: Insufficient documentation

## 2020-03-14 DIAGNOSIS — Z9981 Dependence on supplemental oxygen: Secondary | ICD-10-CM | POA: Diagnosis not present

## 2020-03-14 DIAGNOSIS — E7800 Pure hypercholesterolemia, unspecified: Secondary | ICD-10-CM | POA: Diagnosis not present

## 2020-03-14 DIAGNOSIS — I77810 Thoracic aortic ectasia: Secondary | ICD-10-CM | POA: Insufficient documentation

## 2020-03-14 DIAGNOSIS — Z8249 Family history of ischemic heart disease and other diseases of the circulatory system: Secondary | ICD-10-CM | POA: Diagnosis not present

## 2020-03-14 DIAGNOSIS — I5032 Chronic diastolic (congestive) heart failure: Secondary | ICD-10-CM | POA: Insufficient documentation

## 2020-03-14 DIAGNOSIS — J439 Emphysema, unspecified: Secondary | ICD-10-CM | POA: Diagnosis not present

## 2020-03-14 DIAGNOSIS — Z79899 Other long term (current) drug therapy: Secondary | ICD-10-CM | POA: Insufficient documentation

## 2020-03-14 DIAGNOSIS — Z885 Allergy status to narcotic agent status: Secondary | ICD-10-CM | POA: Diagnosis not present

## 2020-03-14 DIAGNOSIS — I2510 Atherosclerotic heart disease of native coronary artery without angina pectoris: Secondary | ICD-10-CM | POA: Insufficient documentation

## 2020-03-14 DIAGNOSIS — Z7901 Long term (current) use of anticoagulants: Secondary | ICD-10-CM | POA: Insufficient documentation

## 2020-03-14 DIAGNOSIS — I4819 Other persistent atrial fibrillation: Secondary | ICD-10-CM | POA: Diagnosis present

## 2020-03-14 DIAGNOSIS — D6869 Other thrombophilia: Secondary | ICD-10-CM | POA: Diagnosis not present

## 2020-03-14 LAB — BASIC METABOLIC PANEL
Anion gap: 10 (ref 5–15)
BUN: 14 mg/dL (ref 8–23)
CO2: 26 mmol/L (ref 22–32)
Calcium: 10.4 mg/dL — ABNORMAL HIGH (ref 8.9–10.3)
Chloride: 105 mmol/L (ref 98–111)
Creatinine, Ser: 1 mg/dL (ref 0.44–1.00)
GFR calc Af Amer: >60 mL/min (ref >60)
GFR calc non Af Amer: 57 mL/min — ABNORMAL LOW (ref >60)
Glucose, Bld: 93 mg/dL (ref 70–99)
Potassium: 4.3 mmol/L (ref 3.5–5.1)
Sodium: 141 mmol/L (ref 135–145)

## 2020-03-14 LAB — CBC
HCT: 46.7 % — ABNORMAL HIGH (ref 36.0–46.0)
Hemoglobin: 15.5 g/dL — ABNORMAL HIGH (ref 12.0–15.0)
MCH: 30.6 pg (ref 26.0–34.0)
MCHC: 33.2 g/dL (ref 30.0–36.0)
MCV: 92.3 fL (ref 80.0–100.0)
Platelets: 188 10*3/uL (ref 150–400)
RBC: 5.06 MIL/uL (ref 3.87–5.11)
RDW: 14 % (ref 11.5–15.5)
WBC: 9.6 10*3/uL (ref 4.0–10.5)
nRBC: 0 % (ref 0.0–0.2)

## 2020-03-14 LAB — MAGNESIUM: Magnesium: 2.3 mg/dL (ref 1.7–2.4)

## 2020-03-14 NOTE — Progress Notes (Addendum)
Date:  03/14/2020   ID:  [name], DOB [birth year], MRN [id number]  Location:  In office  Provider location: [address] [place], [place] [address] Evaluation Performed:  Follow up   PCP:  [name], MD  Primary Cardiologist:  Dr. [name] Primary Electrophysiologist: Dr. [name]   CC: afib/tikosyn surveillance   History of Present Illness: [name] is a 69 y.o. female who presents for a f/u tikosyn  visit today.   Pt reports that she is doing well without any afib and remains compliant with Tikosyn and  Eliquis. CHA2DS2VASc  Score of 3. No bleeding issues. Lungs are stable and continues on O2 via nasal cannula. Pt has not gotten her covid shots, is on the fence.   Today, she denies symptoms of palpitations, chest pain, shortness of breath, orthopnea, PND, lower extremity edema, claudication, dizziness, presyncope, syncope, bleeding, or neurologic sequela. The patient is tolerating medications without difficulties and is otherwise without complaint today.   she denies symptoms of cough, fevers, chills, or new SOB worrisome for COVID 19.    Atrial Fibrillation Risk Factors:  she does have symptoms or diagnosis of sleep apnea. she is compliant with BiPAP therapy. she does not have a history of rheumatic fever. she does not have a history of alcohol use. The patient does not have a history of early familial atrial fibrillation or other arrhythmias.  she has a BMI of Body mass index is 40.51 kg/m.Marland Kitchen Filed Weights   03/14/20 1501  Weight: 94.1 kg    Past Medical History:  Diagnosis Date  . Chronic diastolic CHF (congestive heart failure) (HCC)   . Chronic respiratory failure (HCC)   . Coronary atherosclerosis    a. 2V by CT 10/2017.  . Emphysema of lung (HCC)   . Hypercholesterolemia   . Hypertension   . NSIP (nonspecific interstitial pneumonia) (HCC)   . Osteoarthritis   . Persistent atrial fibrillation (HCC)   . Thoracic aortic ectasia [facility])     Past Surgical History:  Procedure Laterality Date  . CARDIOVERSION N/A 07/25/2016   Procedure: CARDIOVERSION;  Surgeon: Pricilla Riffle, MD;  Location: [facility] ENDOSCOPY;  Service: Cardiovascular;  Laterality: N/A;  . CARDIOVERSION N/A 09/14/2016   Procedure: CARDIOVERSION;  Surgeon: Hillis Range, MD;  Location: [facility] OR;  Service: Cardiovascular;  Laterality: N/A;  . CATARACT EXTRACTION Bilateral   . LEG SURGERY Right   . TEE WITHOUT CARDIOVERSION N/A 07/25/2016   Procedure: TRANSESOPHAGEAL ECHOCARDIOGRAM (TEE);  Surgeon: Pricilla Riffle, MD;  Location: [facility] ENDOSCOPY;  Service: Cardiovascular;  Laterality: N/A;     Current Outpatient Medications  Medication Sig Dispense Refill  . acetaminophen (TYLENOL) 500 MG tablet Take 500 mg by mouth every 6 (six) hours as needed for moderate pain.    Marland Kitchen ascorbic acid (VITAMIN C) 500 MG tablet Take 500 mg by mouth daily. Takes one tablet every other day    . atorvastatin (LIPITOR) 20 MG tablet SMARTSIG:1 Tablet(s) By Mouth Every Evening    . brimonidine (ALPHAGAN P) 0.1 % SOLN as directed.    . cholecalciferol (VITAMIN D3) 25 MCG (1000 UNIT) tablet Take 1,000 Units by mouth daily. Takes one tablet every other day    . diltiazem (CARTIA XT) 240 MG 24 hr capsule Take 1 capsule (240 mg total) by mouth daily. Please keep upcoming appt in July before anymore refills. Thank you 30 capsule 0  . dofetilide (TIKOSYN) 500 MCG capsule Take 1 capsule (500 mcg  total) by mouth 2 (two) times daily. 60 capsule 6  . ELIQUIS 5 MG TABS tablet TAKE 1 TABLET BY MOUTH TWICE DAILY 180 tablet 1  . furosemide (LASIX) 40 MG tablet Take 1 tablet (40 mg total) by mouth daily. MUST SCH FOLLOW UP APPT FOR FURTHER REFILLS 1ST ATTEMPT 30 tablet 0  . GINKGO BILOBA COMPLEX PO Take by mouth. Takes one tablet every other day    . HYDROcodone-acetaminophen (NORCO/VICODIN) 5-325 MG tablet Take 1 tablet by mouth every 6 (six) hours as needed for moderate pain.    Marland Kitchen latanoprost (XALATAN) 0.005 %  ophthalmic solution Place 1 drop into both eyes at bedtime.    Marland Kitchen LORazepam (ATIVAN) 1 MG tablet TAKE 1 TABLET BY MOUTH AS NEEDED FOR ANXIETY  1  . Multiple Vitamin (MULTIVITAMIN) capsule Take 1 capsule by mouth daily.    . Multiple Vitamins-Minerals (HAIR SKIN NAILS PO) Take by mouth. Takes one tablet every other day    . OXYGEN 2lpm 24/7 and 4 lpm with exertion [facility]    . UNABLE TO FIND BIPAP with 2lpm o2 at night  [facility]     No current facility-administered medications for this encounter.    Allergies:   Levaquin [levofloxacin in d5w] and Percocet [oxycodone-acetaminophen]   Social History:  The patient  reports that she quit smoking about 9 years ago. Her smoking use included cigarettes. She started smoking about 53 years ago. She has a 43.00 pack-year smoking history. She has never used smokeless tobacco. She reports that she does not drink alcohol and does not use drugs.   Family History:  The patient's  family history includes Heart attack in her sister; Heart disease in her father.    ROS:  Please see the history of present illness.   All other systems are personally reviewed and negative.   Exam: GEN- The patient is well appearing, alert and oriented x 3 today.   Head- normocephalic, atraumatic Eyes-  Sclera clear, conjunctiva pink Ears- hearing intact Oropharynx- clear Neck- supple, no JVP Lymph- no cervical lymphadenopathy Lungs- Clear to ausculation bilaterally, normal work of breathing Heart- Regular rate and rhythm, no murmurs, rubs or gallops, PMI not laterally displaced GI- soft, NT, ND, + BS Extremities- no clubbing, cyanosis, or 1t pitting edema( improved) MS- no significant deformity or atrophy Skin- no rash or lesion Psych- euthymic mood, full affect Neuro- strength and sensation are intact      Recent Labs: 09/10/2019: BUN 20; Creatinine, Ser 0.94; Magnesium 2.3; Potassium 4.3; Sodium 146  personally reviewed    Other studies personally reviewed: ekg- NSR  at 73 bpm, pr int 162 md, qrs int 82 ms, qtc 436 ms     ASSESSMENT AND PLAN:  1.  H/o persisitent atrial fibrillation Doing well maintaining SR on Tikosyn 500 mcg bid Continue Cardizem 240 mg a day Continue  eliquis 5 mg bid This patients CHA2DS2-VASc Score and unadjusted Ischemic Stroke Rate (% per year) is equal to 3.2 % stroke rate/year from a score of 3  Above score calculated as 1 point each if present [CHF, HTN, DM, Vascular=MI/PAD/Aortic Plaque, Age if 65-74, or Female] Above score calculated as 2 points each if present [Age > 75, or Stroke/TIA/TE]  Bmet/mag/cbc today  today  Current medicines are reviewed at length with the patient today.   The patient does not have concerns regarding her medicines.  The following changes were made today:  none  Labs/ tests ordered today include: none Orders Placed This Encounter  Procedures  . Basic metabolic panel  . Magnesium  . CBC  . EKG 12-Lead    Patient Risk:  after full review of this patients clinical status, I feel that they are at  Mod risk at this time.   Signed, [name] NP 03/14/2020 3:36 PM  [facility] [address] [place], [place] [address] [phone]

## 2020-03-30 ENCOUNTER — Other Ambulatory Visit: Payer: Self-pay | Admitting: Interventional Cardiology

## 2020-03-30 MED ORDER — DILTIAZEM HCL ER COATED BEADS 240 MG PO CP24: 240.0000 mg | ORAL_CAPSULE | Freq: Every day | ORAL | 3 refills | Status: DC

## 2020-03-30 NOTE — Telephone Encounter (Signed)
Pt's medication was sent to pt's pharmacy as requested. Confirmation received.  °

## 2020-06-12 ENCOUNTER — Other Ambulatory Visit: Payer: Self-pay | Admitting: Thoracic Surgery (Cardiothoracic Vascular Surgery)

## 2020-06-12 DIAGNOSIS — I712 Thoracic aortic aneurysm, without rupture, unspecified: Secondary | ICD-10-CM

## 2020-06-18 ENCOUNTER — Other Ambulatory Visit (HOSPITAL_COMMUNITY): Payer: Self-pay | Admitting: Nurse Practitioner

## 2020-06-30 ENCOUNTER — Ambulatory Visit
Admission: RE | Admit: 2020-06-30 | Discharge: 2020-06-30 | Disposition: A | Discharge status: Home / Self Care | Payer: Medicare Other | Source: Ambulatory Visit | Attending: Thoracic Surgery (Cardiothoracic Vascular Surgery) | Admitting: Thoracic Surgery (Cardiothoracic Vascular Surgery)

## 2020-06-30 ENCOUNTER — Inpatient Hospital Stay: Admission: RE | Admit: 2020-06-30 | Payer: Medicare Other | Source: Ambulatory Visit

## 2020-06-30 DIAGNOSIS — I712 Thoracic aortic aneurysm, without rupture, unspecified: Secondary | ICD-10-CM

## 2020-06-30 MED ORDER — IOPAMIDOL (ISOVUE-370) INJECTION 76%
75.0000 mL | Freq: Once | INTRAVENOUS | Status: AC | PRN
  Administered 2020-06-30: 75 mL via INTRAVENOUS

## 2020-07-04 ENCOUNTER — Ambulatory Visit: Payer: Medicare Other | Admitting: Thoracic Surgery (Cardiothoracic Vascular Surgery)

## 2020-07-04 ENCOUNTER — Encounter: Payer: Self-pay | Admitting: Thoracic Surgery (Cardiothoracic Vascular Surgery)

## 2020-07-04 ENCOUNTER — Other Ambulatory Visit: Payer: Self-pay

## 2020-07-04 VITALS — BP 131/81 | HR 107 | Resp 20 | Ht 60.0 in | Wt 211.0 lb

## 2020-07-04 DIAGNOSIS — I712 Thoracic aortic aneurysm, without rupture, unspecified: Secondary | ICD-10-CM

## 2020-07-04 NOTE — Progress Notes (Signed)
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  She is being followed with CT scans for ILD.  She was noted to have descending thoracic aneurysms.  One in the midportion of the descending thoracic aorta is about 4.3 cm and another in the thoracoabdominal region at the diaphragmatic hiatus is about 4.5 cm.  She also had a penetrating ulcer of the distal arch.  We have been following her since 2018.  Her dyspnea has been stable.  She uses 2 L nasal cannula at rest.  She will sometimes turn that up to 3 if she is going to walk for any distance or has to go up flight of stairs.  She has had some pain in the her neck that she thinks may be due to arthritis.  She has not had any chest pain, pressure, tightness, or severe back pain.  Continues to have some swelling in her legs particularly later in the day.  Past Medical History:  Diagnosis Date  . Chronic diastolic CHF (congestive heart failure) (HCC)   . Chronic respiratory failure (HCC)   . Coronary atherosclerosis    a. 2V by CT 10/2017.  . Emphysema of lung (HCC)   . Hypercholesterolemia   . Hypertension   . NSIP (nonspecific interstitial pneumonia) (HCC)   . Osteoarthritis   . Persistent atrial fibrillation (HCC)   . Thoracic aortic ectasia (HCC)     Current Outpatient Medications  Medication Sig Dispense Refill  . acetaminophen (TYLENOL) 500 MG tablet Take 500 mg by mouth every 6 (six) hours as  needed for moderate pain.    Marland Kitchen ascorbic acid (VITAMIN C) 500 MG tablet Take 500 mg by mouth daily. Takes one tablet every other day    . atorvastatin (LIPITOR) 20 MG tablet SMARTSIG:1 Tablet(s) By Mouth Every Evening    . brimonidine (ALPHAGAN P) 0.1 % SOLN as directed.    . cholecalciferol (VITAMIN D3) 25 MCG (1000 UNIT) tablet Take 1,000 Units by mouth daily. Takes one tablet every other day    . diltiazem (CARTIA XT) 240 MG 24 hr capsule Take 1 capsule (240 mg total) by mouth daily. 90 capsule 3  . dofetilide (TIKOSYN) 500 MCG capsule TAKE ONE CAPSULE BY MOUTH TWICE DAILY 60 capsule 6  . ELIQUIS 5 MG TABS tablet TAKE 1 TABLET BY MOUTH TWICE DAILY 180 tablet 1  . furosemide (LASIX) 40 MG tablet Take 1 tablet (40 mg total) by mouth daily. MUST SCH FOLLOW UP APPT FOR FURTHER REFILLS 1ST ATTEMPT 30 tablet 0  . GINKGO BILOBA COMPLEX PO Take by mouth. Takes one tablet every other day    . HYDROcodone-acetaminophen (NORCO/VICODIN) 5-325 MG tablet Take 1 tablet by mouth every 6 (six) hours as needed for moderate pain.    Marland Kitchen latanoprost (XALATAN) 0.005 % ophthalmic solution Place 1 drop into both eyes at bedtime.    Marland Kitchen LORazepam (ATIVAN) 1 MG tablet TAKE 1 TABLET BY MOUTH AS NEEDED FOR ANXIETY  1  . Multiple Vitamin (  MULTIVITAMIN) capsule Take 1 capsule by mouth daily.    . Multiple Vitamins-Minerals (HAIR SKIN NAILS PO) Take by mouth. Takes one tablet every other day    . OXYGEN 2lpm 24/7 and 4 lpm with exertion [facility]    . UNABLE TO FIND BIPAP with 2lpm o2 at night  [facility]     No current facility-administered medications for this visit.    Physical Exam BP 131/81 (BP Location: Right Arm, Patient Position: Sitting)   Pulse (!) 107   Resp 20   Ht 5' (1.524 m)   Wt 211 lb (95.7 kg)   SpO2 93% Comment: 2L O2 [place] with mask on  BMI 41.44 kg/m  69 year old woman in no acute distress Obese Alert and oriented x3 with no focal motor deficits No carotid bruits or supraclavicular adenopathy Cardiac was  irregularly irregular, no murmur Lungs diminished bilaterally but otherwise clear with no wheezing or rales No peripheral edema  Diagnostic Tests: CT ANGIOGRAPHY CHEST WITH CONTRAST  TECHNIQUE: Multidetector CT imaging of the chest was performed using the standard protocol during bolus administration of intravenous contrast. Multiplanar CT image reconstructions and MIPs were obtained to evaluate the vascular anatomy.  CONTRAST:  75mL [id number]  Creatinine was obtained on site at [facility] at [address]  Results: Creatinine 1.0 mg/dL.  COMPARISON:  01/06/2020  FINDINGS: Cardiovascular: Thoracic aorta demonstrates atherosclerotic calcifications. The evaluation of the aortic root is somewhat limited due to cardiac motion although appears stable in appearance. The ascending aorta measures 3.6 cm in diameter stable in appearance from the prior study. Thoracic arch is unremarkable. In the descending thoracic aorta there is again noted fusiform dilatation to 4.3 cm stable in appearance from the prior exam. Persistent tortuosity is seen. At the level of the aortic hiatus, the descending thoracic aorta measures 4.5 cm also stable in appearance.  Persistent penetrating ulcer is noted in the distal aspect of the arch similar to that seen on the prior exam. No dissection is identified. The pulmonary artery shows no evidence of pulmonary emboli. No significant coronary calcifications are noted.  Mediastinum/Nodes: Thoracic inlet is within normal limits. No sizable hilar or mediastinal adenopathy is noted. The esophagus as visualized is within normal limits.  Lungs/Pleura: Diffuse emphysematous changes are noted. Stable nodule is noted in the left apex measuring 2-3 mm. No new nodules are seen. Mild subpleural interstitial fibrotic changes are seen similar to that seen on the prior study.  Upper Abdomen: Visualized upper abdomen demonstrates cystic  changes within the left kidney stable from the prior exam.  Musculoskeletal: Degenerative changes of the thoracic spine are noted. No acute rib abnormality is noted.  Review of the MIP images confirms the above findings.  IMPRESSION: Stable dilatation of the descending thoracic aorta and its midportion measuring 4.3 cm. The distal thoracic aorta at the level of the aortic hiatus measures 4.5 cm. Recommend semi-annual imaging followup by CTA or MRA. This recommendation follows 2010 ACCF/AHA/AATS/ACR/ASA/SCA/SCAI/SIR/STS/SVM Guidelines for the Diagnosis and Management of Patients With Thoracic Aortic Disease. Circulation. 2010; 121: V035-K093. Aortic aneurysm NOS (ICD10-I71.9)  No evidence of aortic dissection.  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  No acute abnormality is noted.  Aortic Atherosclerosis (ICD10-I70.0) and Emphysema (ICD10-J43.9).   Electronically Signed   By: Alcide Clever M.D.   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  She is on Lipitor for her hyperlipidemia.  Descending thoracic aneurysm and thoracoabdominal aneurysm.  Stable at around 4.3 and 4.5 cm respectively.  I measure the aneurysm in the midportion of the descending aorta at 4.4 cm, which is within the margin of air.  There has been no significant change.  Needs continued semiannual  follow-up.  Hypertension-blood pressure well controlled on current regimen.  Ambulatory BP monitored by pharmacist at [facility].  Plan: Return in 6 months with CT angiogram of chest  I spent over 20 minutes in review of images, records, and in consultation with [name] today.  Loreli Slot, MD [facility] [phone]

## 2020-08-24 ENCOUNTER — Encounter (HOSPITAL_COMMUNITY): Payer: Self-pay | Admitting: Nurse Practitioner

## 2020-08-24 ENCOUNTER — Ambulatory Visit (HOSPITAL_COMMUNITY)
Admission: RE | Admit: 2020-08-24 | Discharge: 2020-08-24 | Disposition: A | Discharge status: Home / Self Care | Payer: Medicare Other | Source: Ambulatory Visit | Attending: Nurse Practitioner | Admitting: Nurse Practitioner

## 2020-08-24 ENCOUNTER — Other Ambulatory Visit: Payer: Self-pay

## 2020-08-24 VITALS — BP 154/88 | HR 81 | Ht 60.0 in | Wt 212.2 lb

## 2020-08-24 DIAGNOSIS — Z9989 Dependence on other enabling machines and devices: Secondary | ICD-10-CM | POA: Insufficient documentation

## 2020-08-24 DIAGNOSIS — G4730 Sleep apnea, unspecified: Secondary | ICD-10-CM | POA: Diagnosis not present

## 2020-08-24 DIAGNOSIS — Z79899 Other long term (current) drug therapy: Secondary | ICD-10-CM | POA: Diagnosis not present

## 2020-08-24 DIAGNOSIS — D6869 Other thrombophilia: Secondary | ICD-10-CM

## 2020-08-24 DIAGNOSIS — Z881 Allergy status to other antibiotic agents status: Secondary | ICD-10-CM | POA: Diagnosis not present

## 2020-08-24 DIAGNOSIS — I5032 Chronic diastolic (congestive) heart failure: Secondary | ICD-10-CM | POA: Insufficient documentation

## 2020-08-24 DIAGNOSIS — I110 Hypertensive heart disease with heart failure: Secondary | ICD-10-CM | POA: Insufficient documentation

## 2020-08-24 DIAGNOSIS — Z7901 Long term (current) use of anticoagulants: Secondary | ICD-10-CM | POA: Diagnosis not present

## 2020-08-24 DIAGNOSIS — Z885 Allergy status to narcotic agent status: Secondary | ICD-10-CM | POA: Insufficient documentation

## 2020-08-24 DIAGNOSIS — Z87891 Personal history of nicotine dependence: Secondary | ICD-10-CM | POA: Diagnosis not present

## 2020-08-24 DIAGNOSIS — I4819 Other persistent atrial fibrillation: Secondary | ICD-10-CM | POA: Insufficient documentation

## 2020-08-24 LAB — BASIC METABOLIC PANEL
Anion gap: 10 (ref 5–15)
BUN: 13 mg/dL (ref 8–23)
CO2: 30 mmol/L (ref 22–32)
Calcium: 10.3 mg/dL (ref 8.9–10.3)
Chloride: 104 mmol/L (ref 98–111)
Creatinine, Ser: 0.84 mg/dL (ref 0.44–1.00)
GFR, Estimated: >60 mL/min (ref >60)
Glucose, Bld: 113 mg/dL — ABNORMAL HIGH (ref 70–99)
Potassium: 5.1 mmol/L (ref 3.5–5.1)
Sodium: 144 mmol/L (ref 135–145)

## 2020-08-24 LAB — MAGNESIUM: Magnesium: 2.4 mg/dL (ref 1.7–2.4)

## 2020-08-24 NOTE — Progress Notes (Signed)
Date:  08/24/2020   ID:  [name], DOB [birth year], MRN [id number]  Location:  In office  Provider location: [address] [place], [place] [address] Evaluation Performed:  Follow up   PCP:  [name], MD  Primary Cardiologist:  Dr. [name] Primary Electrophysiologist: Dr. [name]   CC: afib/tikosyn surveillance   History of Present Illness: [name] is a 68 y.o. female who presents for a f/u tikosyn  visit today.   Pt reports that she is doing well without any afib and remains compliant with Tikosyn and  Eliquis. CHA2DS2VASc Score of 3. No bleeding issues. Lungs are stable and continues on O2 via nasal cannula. Pt has not gotten her covid shots.   Today, she denies symptoms of palpitations, chest pain, shortness of breath, orthopnea, PND, lower extremity edema, claudication, dizziness, presyncope, syncope, bleeding, or neurologic sequela. The patient is tolerating medications without difficulties and is otherwise without complaint today.   she denies symptoms of cough, fevers, chills, or new SOB worrisome for COVID 19.    Atrial Fibrillation Risk Factors:  she does have symptoms or diagnosis of sleep apnea. she is compliant with BiPAP therapy. she does not have a history of rheumatic fever. she does not have a history of alcohol use. The patient does not have a history of early familial atrial fibrillation or other arrhythmias.  she has a BMI of There is no height or weight on file to calculate BMI.. There were no vitals filed for this visit.  Past Medical History:  Diagnosis Date  . Chronic diastolic CHF (congestive heart failure) (HCC)   . Chronic respiratory failure (HCC)   . Coronary atherosclerosis    a. 2V by CT 10/2017.  . Emphysema of lung (HCC)   . Hypercholesterolemia   . Hypertension   . NSIP (nonspecific interstitial pneumonia) (HCC)   . Osteoarthritis   . Persistent atrial fibrillation (HCC)   . Thoracic aortic ectasia [facility])     Past Surgical History:  Procedure Laterality Date  . CARDIOVERSION N/A 07/25/2016   Procedure: CARDIOVERSION;  Surgeon: Pricilla Riffle, MD;  Location: [facility] ENDOSCOPY;  Service: Cardiovascular;  Laterality: N/A;  . CARDIOVERSION N/A 09/14/2016   Procedure: CARDIOVERSION;  Surgeon: Hillis Range, MD;  Location: [facility] OR;  Service: Cardiovascular;  Laterality: N/A;  . CATARACT EXTRACTION Bilateral   . LEG SURGERY Right   . TEE WITHOUT CARDIOVERSION N/A 07/25/2016   Procedure: TRANSESOPHAGEAL ECHOCARDIOGRAM (TEE);  Surgeon: Pricilla Riffle, MD;  Location: [facility] ENDOSCOPY;  Service: Cardiovascular;  Laterality: N/A;     Current Outpatient Medications  Medication Sig Dispense Refill  . acetaminophen (TYLENOL) 500 MG tablet Take 500 mg by mouth every 6 (six) hours as needed for moderate pain.    Marland Kitchen ascorbic acid (VITAMIN C) 500 MG tablet Take 500 mg by mouth daily. Takes one tablet every other day    . atorvastatin (LIPITOR) 20 MG tablet SMARTSIG:1 Tablet(s) By Mouth Every Evening    . brimonidine (ALPHAGAN P) 0.1 % SOLN as directed.    . cholecalciferol (VITAMIN D3) 25 MCG (1000 UNIT) tablet Take 1,000 Units by mouth daily. Takes one tablet every other day    . diltiazem (CARTIA XT) 240 MG 24 hr capsule Take 1 capsule (240 mg total) by mouth daily. 90 capsule 3  . dofetilide (TIKOSYN) 500 MCG capsule TAKE ONE CAPSULE BY MOUTH TWICE DAILY 60 capsule 6  . ELIQUIS 5 MG TABS tablet TAKE 1 TABLET  BY MOUTH TWICE DAILY 180 tablet 1  . furosemide (LASIX) 40 MG tablet Take 1 tablet (40 mg total) by mouth daily. MUST SCH FOLLOW UP APPT FOR FURTHER REFILLS 1ST ATTEMPT 30 tablet 0  . GINKGO BILOBA COMPLEX PO Take by mouth. Takes one tablet every other day    . HYDROcodone-acetaminophen (NORCO/VICODIN) 5-325 MG tablet Take 1 tablet by mouth every 6 (six) hours as needed for moderate pain.    Marland Kitchen latanoprost (XALATAN) 0.005 % ophthalmic solution Place 1 drop into both eyes at bedtime.    Marland Kitchen LORazepam (ATIVAN) 1 MG tablet  TAKE 1 TABLET BY MOUTH AS NEEDED FOR ANXIETY  1  . Multiple Vitamin (MULTIVITAMIN) capsule Take 1 capsule by mouth daily.    . Multiple Vitamins-Minerals (HAIR SKIN NAILS PO) Take by mouth. Takes one tablet every other day    . OXYGEN 2lpm 24/7 and 4 lpm with exertion [facility]    . UNABLE TO FIND BIPAP with 2lpm o2 at night  [facility]     No current facility-administered medications for this encounter.    Allergies:   Levaquin [levofloxacin in d5w] and Percocet [oxycodone-acetaminophen]   Social History:  The patient  reports that she quit smoking about 10 years ago. Her smoking use included cigarettes. She started smoking about 54 years ago. She has a 43.00 pack-year smoking history. She has never used smokeless tobacco. She reports that she does not drink alcohol and does not use drugs.   Family History:  The patient's  family history includes Heart attack in her sister; Heart disease in her father.    ROS:  Please see the history of present illness.   All other systems are personally reviewed and negative.   Exam: GEN- The patient is well appearing, alert and oriented x 3 today.   Head- normocephalic, atraumatic Eyes-  Sclera clear, conjunctiva pink Ears- hearing intact Oropharynx- clear Neck- supple, no JVP Lymph- no cervical lymphadenopathy Lungs- Clear to ausculation bilaterally, normal work of breathing Heart- Regular rate and rhythm, no murmurs, rubs or gallops, PMI not laterally displaced GI- soft, NT, ND, + BS Extremities- no clubbing, cyanosis, or 1t pitting edema( improved) MS- no significant deformity or atrophy Skin- no rash or lesion Psych- euthymic mood, full affect Neuro- strength and sensation are intact      Recent Labs: 03/14/2020: BUN 14; Creatinine, Ser 1.00; Hemoglobin 15.5; Magnesium 2.3; Platelets 188; Potassium 4.3; Sodium 141  personally reviewed    Other studies personally reviewed: Ekg- Normal sinus rhythm Normal ECG Vent. rate 81 BPM PR interval 154  ms QRS duration 78 ms QT/QTc 376/436 ms      ASSESSMENT AND PLAN:  1.  H/o persisitent atrial fibrillation Doing well maintaining SR on Tikosyn 500 mcg bid Continue Cardizem 240 mg a day Continue  eliquis 5 mg bid This patients CHA2DS2-VASc Score and unadjusted Ischemic Stroke Rate (% per year) is equal to 3.2 % stroke rate/year from a score of 3  Above score calculated as 1 point each if present [CHF, HTN, DM, Vascular=MI/PAD/Aortic Plaque, Age if 65-74, or Female] Above score calculated as 2 points each if present [Age > 75, or Stroke/TIA/TE]  Bmet/mag/  today  Current medicines are reviewed at length with the patient today.   The patient does not have concerns regarding her medicines.  The following changes were made today:  none  Labs/ tests ordered today include: none No orders of the defined types were placed in this encounter.   Patient Risk:  after full review of this patients clinical status, I feel that they are at  Mod risk at this time.   Signed, [name] NP 08/24/2020 8:35 AM  [facility] [address] [place], [place] [address] [phone]

## 2020-08-31 DIAGNOSIS — J449 Chronic obstructive pulmonary disease, unspecified: Secondary | ICD-10-CM | POA: Diagnosis not present

## 2020-08-31 DIAGNOSIS — R069 Unspecified abnormalities of breathing: Secondary | ICD-10-CM | POA: Diagnosis not present

## 2020-08-31 DIAGNOSIS — J9601 Acute respiratory failure with hypoxia: Secondary | ICD-10-CM | POA: Diagnosis not present

## 2020-09-01 DIAGNOSIS — M810 Age-related osteoporosis without current pathological fracture: Secondary | ICD-10-CM | POA: Insufficient documentation

## 2020-09-01 DIAGNOSIS — N183 Chronic kidney disease, stage 3 unspecified: Secondary | ICD-10-CM | POA: Insufficient documentation

## 2020-09-01 DIAGNOSIS — R52 Pain, unspecified: Secondary | ICD-10-CM | POA: Diagnosis not present

## 2020-09-01 DIAGNOSIS — E042 Nontoxic multinodular goiter: Secondary | ICD-10-CM | POA: Insufficient documentation

## 2020-09-01 DIAGNOSIS — G894 Chronic pain syndrome: Secondary | ICD-10-CM | POA: Insufficient documentation

## 2020-09-01 DIAGNOSIS — R059 Cough, unspecified: Secondary | ICD-10-CM | POA: Diagnosis not present

## 2020-09-01 DIAGNOSIS — Z8639 Personal history of other endocrine, nutritional and metabolic disease: Secondary | ICD-10-CM | POA: Insufficient documentation

## 2020-09-01 DIAGNOSIS — Z1152 Encounter for screening for COVID-19: Secondary | ICD-10-CM | POA: Diagnosis not present

## 2020-09-01 DIAGNOSIS — Z860100 Personal history of colon polyps, unspecified: Secondary | ICD-10-CM | POA: Insufficient documentation

## 2020-09-01 DIAGNOSIS — F419 Anxiety disorder, unspecified: Secondary | ICD-10-CM | POA: Insufficient documentation

## 2020-09-01 DIAGNOSIS — I872 Venous insufficiency (chronic) (peripheral): Secondary | ICD-10-CM | POA: Insufficient documentation

## 2020-09-01 DIAGNOSIS — J029 Acute pharyngitis, unspecified: Secondary | ICD-10-CM | POA: Diagnosis not present

## 2020-09-01 DIAGNOSIS — E1169 Type 2 diabetes mellitus with other specified complication: Secondary | ICD-10-CM | POA: Insufficient documentation

## 2020-09-20 DIAGNOSIS — I5032 Chronic diastolic (congestive) heart failure: Secondary | ICD-10-CM | POA: Diagnosis not present

## 2020-09-20 DIAGNOSIS — J8410 Pulmonary fibrosis, unspecified: Secondary | ICD-10-CM | POA: Diagnosis not present

## 2020-09-20 DIAGNOSIS — Z23 Encounter for immunization: Secondary | ICD-10-CM | POA: Diagnosis not present

## 2020-09-20 DIAGNOSIS — I712 Thoracic aortic aneurysm, without rupture: Secondary | ICD-10-CM | POA: Diagnosis not present

## 2020-09-20 DIAGNOSIS — I700 Atherosclerosis of aorta: Secondary | ICD-10-CM | POA: Diagnosis not present

## 2020-09-20 DIAGNOSIS — E7800 Pure hypercholesterolemia, unspecified: Secondary | ICD-10-CM | POA: Diagnosis not present

## 2020-09-20 DIAGNOSIS — I480 Paroxysmal atrial fibrillation: Secondary | ICD-10-CM | POA: Diagnosis not present

## 2020-09-20 DIAGNOSIS — G4733 Obstructive sleep apnea (adult) (pediatric): Secondary | ICD-10-CM | POA: Diagnosis not present

## 2020-09-20 DIAGNOSIS — I10 Essential (primary) hypertension: Secondary | ICD-10-CM | POA: Diagnosis not present

## 2020-09-20 DIAGNOSIS — E1169 Type 2 diabetes mellitus with other specified complication: Secondary | ICD-10-CM | POA: Diagnosis not present

## 2020-09-25 ENCOUNTER — Other Ambulatory Visit: Payer: Self-pay

## 2020-09-25 ENCOUNTER — Encounter: Payer: Self-pay | Admitting: Internal Medicine

## 2020-09-25 ENCOUNTER — Ambulatory Visit: Payer: Medicare Other | Admitting: Internal Medicine

## 2020-09-25 DIAGNOSIS — J8410 Pulmonary fibrosis, unspecified: Secondary | ICD-10-CM | POA: Diagnosis not present

## 2020-09-25 DIAGNOSIS — J209 Acute bronchitis, unspecified: Secondary | ICD-10-CM

## 2020-09-25 DIAGNOSIS — J9612 Chronic respiratory failure with hypercapnia: Secondary | ICD-10-CM | POA: Diagnosis not present

## 2020-09-25 DIAGNOSIS — J9611 Chronic respiratory failure with hypoxia: Secondary | ICD-10-CM

## 2020-09-25 MED ORDER — DOXYCYCLINE HYCLATE 100 MG PO TABS: 100.0000 mg | ORAL_TABLET | Freq: Two times a day (BID) | ORAL | 0 refills | Status: DC

## 2020-09-25 NOTE — Assessment & Plan Note (Addendum)
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 However, there does appear to be a mild craniocaudal gradient. The possibility of early usual interstitial pneumonia (UIP) although not favored, is not entirely excluded. Accordingly, repeat high-resolution chest CT is recommended in 12 months  - PFT's  03/06/2017  F VC  1.77 (63%)  no % improvement from saba p 0  prior to study with DLCO  67/66 % corrects to 99  % for alv volume   - 69mw  06/06/2017   304 m on 4lpm s stopping or desats - HRCT chest 11/13/17 1. 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 -  11/18/2017  HSP serology/ collagen vasc profile >  Neg  - PFT's  11/18/2017  FVC 1.71 (61%)  with DLCO  64/59c  % corrects to 94 % for alv volume  And ERV 34%  No clinical evidence progression > no change rx   Very high risk for covid (see separate a/p)

## 2020-09-25 NOTE — Progress Notes (Signed)
Subjective:     Patient ID: [name], female   DOB: [birth year]    MRN: [id number]    Brief patient profile:  69   yowf quit smoking 2012 due to some cough/wheezing improved p quit at wt 180 and no obst on spirometry 06/25/16  referred to pulmonary clinic 06/25/2016 by Toni   [name] s/p admit:   Admit date: 02/05/2016 Discharge date: 02/08/2016  Discharge Diagnoses:  Principal Problem:   Acute respiratory failure with hypoxia (HCC) Active Problems:   HTN (hypertension)   PNA (pneumonia)   Glaucoma      History of Present Illness  06/25/2016 1st [facility] office visit/ [name]   ? NSIP  Chief Complaint  Patient presents with  . Consult visit    COPD consult per [name]- Pt states SOB w/ excertion. No congestion or tightness. Occasional coughing at times.   onset of sob spring 2017 x one month prior to admit on 02/05/16 gradually worse while on lisinopril  In April 2017 was using [facility] parking but still able to do [employer]  Now sob across the room  Sob occurs at rest if speaking  Able to lie down rotated on to left side otherwise chokes/ smothers rec Stop lotensin and start ibesartan 150 mg one daily  And your breathing should gradually improve  Please schedule a follow up office visit in 2 weeks, sooner if needed to see [name] to recheck your blood pressure  Add double the lasix dose for now    10/29/2016 acute extended ov/[name] re: new onset resp failure/ pt maint on eliquis but no rsp rx  Chief Complaint  Patient presents with  . Acute Visit    Pt c/o increased SOB and lower o2 sats for the past wk.   gradually worse sob x one week assoc with low 02 sats though baseline doe = MMRC3 = can't walk 100 yards even at a slow pace at a flat grade s stopping due to sob   No sign cough/ no chills no cp or vomiting  Leg swelling no worse than usual  rec Please see patient coordinator before you leave today  to schedule start 02 24/7 at 2lpm and ambulatory 02  titration to see if eligible for POC  Wear 02 2lpm 24/7 for now at 2lpm and increase to 4lpm with exertion Use your incentive spirometer as much as you can  Please remember to go to the lab and x-ray department downstairs in the basement  for your tests - we will call you with the results when they are available. I will arrange follow up for you here if indicated, otherwise just return to cardiology as planned  Late add:  Needs hrct next p taking one extra lasix daily x one week > NSIP    11/18/2017  f/u ov/[name] re:  ?NSIP last bird exp 2-3 years  02 2lpm bipap and 2lpm with activity  Chief Complaint  Patient presents with  . Follow-up    PFT's done today. Breathing is unchanged.   Dyspnea:  Still shopping at [employer]/ wearing 2lpm with activity  Cough: none Sleep: sleep ok on cpap/ 02 SABA use:  n/a [name]  Now following for Cards rec Avoid bird exposure  I will let [name] know about your Aortic enlargement but the main treatment is to lose weight and keep your blood pressure down            01/05/2020  f/u ov/[name]  re: nsip / paf / does not want vaccine "afraid of it from what I've heard")  Chief Complaint  Patient presents with  . Annual Exam    some coughing in AM, some phlem comes up, some SOB with walking   Dyspnea:  Walking outside and [place] street and [place] most of the time on 3lpm  POC  Cough: mostly in am  Sleeping: on back one pillow and bipap from sleep doctor [name]  SABA use: none 02: 2lpm at hs/  None at rest and 3 when active  rec No change in 02 sleeping and not needed sitting.  Most of the time 3lpm should be fine on your portable  However, make sure you check your oxygen saturations occasionally at highest level of activity to be sure it stays over 90% and adjust upward to maintain this level if needed but remember to turn it back to previous settings when you stop (to conserve your supply) = back to zero flow. If losing ground with  your activities or not getting adequate (over 90% saturations) please call for early follow up. I strongly recommend you reconsider the Covid vaccine, especially the pfizer or moderna whichever is being offered.    09/25/2020  Acute  ov/[name] re: NSIP / afib/02 24/7  @ 2lpm , still not vaccinated  Chief Complaint  Patient presents with  . Acute Visit  Dyspnea:  Room to room  Cough: worse than usual  X 10 days, dark s cp or sob over baseline Sleeping: bipap per [name] SABA use: none 02: 2lpm 24/7    No obvious day to day or daytime variability mucus plugs  or cp or chest tightness, subjective wheeze or overt sinus or hb symptoms.   Sleeping  without nocturnal  or early am exacerbation  of respiratory  c/o's or need for noct saba. Also denies any obvious fluctuation of symptoms with weather or environmental changes or other aggravating or alleviating factors except as outlined above   No unusual exposure hx or h/o childhood pna/ asthma or knowledge of premature birth.  Current Allergies, Complete Past Medical History, Past Surgical History, Family History, and Social History were reviewed in [employer] record.  ROS  The following are not active complaints unless bolded Hoarseness, sore throat, dysphagia, dental problems, itching, sneezing,  nasal congestion or discharge of excess mucus or purulent secretions, ear ache,   fever, chills, sweats, unintended wt loss or wt gain, classically pleuritic or exertional cp,  orthopnea pnd or arm/hand swelling  or leg swelling, presyncope, palpitations, abdominal pain, anorexia, nausea, vomiting, diarrhea  or change in bowel habits or change in bladder habits, change in stools or change in urine, dysuria, hematuria,  rash, arthralgias, visual complaints, headache, numbness, weakness or ataxia or problems with walking or coordination,  change in mood or  memory.        Current Meds  Medication Sig  . ACCU-CHEK GUIDE test  strip See admin instructions.  Marland Kitchen acetaminophen (TYLENOL) 500 MG tablet Take 500 mg by mouth every 6 (six) hours as needed for moderate pain.  Marland Kitchen ascorbic acid (VITAMIN C) 500 MG tablet Take 500 mg by mouth daily. Takes one tablet every other day  . atorvastatin (LIPITOR) 20 MG tablet SMARTSIG:1 Tablet(s) By Mouth Every Evening  . brimonidine (ALPHAGAN) 0.2 % ophthalmic solution 1 drop 2 (two) times daily.  . calcium citrate-vitamin D (CITRACAL+D) 315-200 MG-UNIT tablet Take 1 tablet by mouth daily.  . cholecalciferol (  VITAMIN D3) 25 MCG (1000 UNIT) tablet Take 1,000 Units by mouth daily. Takes one tablet every other day  . diltiazem (CARTIA XT) 240 MG 24 hr capsule Take 1 capsule (240 mg total) by mouth daily.  Marland Kitchen dofetilide (TIKOSYN) 500 MCG capsule TAKE ONE CAPSULE BY MOUTH TWICE DAILY  . ELIQUIS 5 MG TABS tablet TAKE 1 TABLET BY MOUTH TWICE DAILY  . furosemide (LASIX) 40 MG tablet Take 1 tablet (40 mg total) by mouth daily. MUST SCH FOLLOW UP APPT FOR FURTHER REFILLS 1ST ATTEMPT  . GINKGO BILOBA COMPLEX PO Take by mouth. Takes one tablet every other day  . HYDROcodone-acetaminophen (NORCO/VICODIN) 5-325 MG tablet Take 1 tablet by mouth every 6 (six) hours as needed for moderate pain.  Marland Kitchen irbesartan (AVAPRO) 300 MG tablet Take 300 mg by mouth daily.  Marland Kitchen latanoprost (XALATAN) 0.005 % ophthalmic solution Place 1 drop into both eyes at bedtime.  Marland Kitchen LORazepam (ATIVAN) 1 MG tablet Take 1 mg by mouth as needed.  . Multiple Vitamin (MULTIVITAMIN) capsule Take 1 capsule by mouth daily.  . Multiple Vitamins-Minerals (HAIR SKIN NAILS PO) Take by mouth. Takes one tablet every other day  . OXYGEN 2lpm 24/7 and 4 lpm with exertion [facility]  . UNABLE TO FIND BIPAP with 2lpm o2 at night  [facility]  . vitamin B-12 (CYANOCOBALAMIN) 1000 MCG tablet 1 tablet                   Objective:   Physical Exam     09/25/2020         206 01/05/2020         202  01/06/2019         193  05/21/2018         190  11/18/2017          198  06/06/2017       200  .03/06/2017        204  12/05/2016         203   07/23/2016     208   06/25/16 204 lb 1.6 oz (92.6 kg)  02/06/16 204 lb 9.4 oz (92.8 kg)  02/25/08 163 lb (73.9 kg)     Vital signs reviewed  09/25/2020  - Note at rest 02 sats  91% on 2lpm    General appearance:   Obese amb chronically ill appearing wf nad     HEENT : pt wearing mask not removed for exam due to covid -19 concerns.    NECK :  without JVD/Nodes/TM/ nl carotid upstrokes bilaterally   LUNGS: no acc muscle use,  Nl contour chest with slightly coarse insp cracles bases  bilaterally without cough on insp or exp maneuvers   CV:  RRR  no s3 or murmur or increase in P2, and no edema   ABD:  soft and nontender with nl inspiratory excursion in the supine position. No bruits or organomegaly appreciated, bowel sounds nl  MS:  Nl gait/ ext warm without deformities, calf tenderness, cyanosis or clubbing No obvious joint restrictions   SKIN: warm and dry without lesions    NEURO:  alert, approp, nl sensorium with  no motor or cerebellar deficits apparent.     I personally reviewed images and agree with radiology impression as follows:   Chest CTa  06/30/20  Stable dilatation of the descending thoracic aorta and its midportion measuring 4.3 cm. The distal thoracic aorta at the level of the aortic hiatus measures 4.5 cm.  No  evidence of aortic dissection. 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 No acute abnormality is noted. Aortic Atherosclerosis (ICD10-I70.0) and Emphysema (ICD10-J43.9).         Assessment:

## 2020-09-25 NOTE — Patient Instructions (Addendum)
Make sure you check your oxygen saturation  at your highest level of activity  to be sure it stays over 90% and adjust  02 flow upward to maintain this level if needed but remember to turn it back to previous settings when you stop (to conserve your supply).    Doxycycline 100 mg twice daily x 10 days  > call if cough not better when done    Please schedule a follow up visit in 6 months but call sooner if needed

## 2020-09-25 NOTE — Assessment & Plan Note (Addendum)
HCO3  10/29/2016  = 32   Qualified for 02  10/29/16  Saturations on Room Air at Rest =87% - Patient Saturations on 2lpm o2= 93% - Sats walking on 4lpm 90% Started bipap per Dr Leonides Sake 11/29/16  - 12/05/2016  Patient Saturations on Room Air at Rest =88% Patient Saturations on 2lpm o2= 93% - sats ok 06/06/2017 on 6 mw on 4lpm  - HCO3 10/01/17 = 25 so hypercarbia clearly better  - 05/21/2018   [facility] RA  2 laps @ 185 ft each stopped due to desats to 86% corrected on 2lpm / slow pace  - 01/05/2020 RA 92% and on 3lpm POC sats 90% p 750 ft slow pace / stopping last lap due to sob     rec as of 09/25/2020 = 2lpm hs with cpap and tritrate daytime to keep > 90%    Adviserd:  Patient is unvaccinated and was informed of the seriousness of COVID 19 infection as a direct risk to lung health as well as safety to close contacts and should continue to wear a facemask in public and minimize exposure to public locations but especially avoid any area or activity where non-close contacts are not observing distancing or wearing an appropriate face mask.  I strongly recommended pt take either of the vaccines available through local drugstores based on updated information on millions of Americans treated with the Moderna and [employer] products  which have proven both safe and  effective even against the  delta and new omicron variant to prevent hospitalization and death.

## 2020-09-25 NOTE — Assessment & Plan Note (Addendum)
Onset 09/13/20 > rx  09/25/2020 = doxy x 10 days and f/u with cxr if not better         Each maintenance medication was reviewed in detail including emphasizing most importantly the difference between maintenance and prns and under what circumstances the prns are to be triggered using an action plan format where appropriate.  Total time for H and P, chart review, counseling, reviewing 02 device(s) and generating customized AVS unique to this office visit / same day charting = 32 min

## 2020-09-29 DIAGNOSIS — I10 Essential (primary) hypertension: Secondary | ICD-10-CM | POA: Diagnosis not present

## 2020-09-29 DIAGNOSIS — E1169 Type 2 diabetes mellitus with other specified complication: Secondary | ICD-10-CM | POA: Diagnosis not present

## 2020-09-29 DIAGNOSIS — M810 Age-related osteoporosis without current pathological fracture: Secondary | ICD-10-CM | POA: Diagnosis not present

## 2020-09-29 DIAGNOSIS — E7800 Pure hypercholesterolemia, unspecified: Secondary | ICD-10-CM | POA: Diagnosis not present

## 2020-09-29 DIAGNOSIS — I5032 Chronic diastolic (congestive) heart failure: Secondary | ICD-10-CM | POA: Diagnosis not present

## 2020-09-29 DIAGNOSIS — I5030 Unspecified diastolic (congestive) heart failure: Secondary | ICD-10-CM | POA: Diagnosis not present

## 2020-09-29 DIAGNOSIS — I4891 Unspecified atrial fibrillation: Secondary | ICD-10-CM | POA: Diagnosis not present

## 2020-09-29 DIAGNOSIS — I480 Paroxysmal atrial fibrillation: Secondary | ICD-10-CM | POA: Diagnosis not present

## 2020-09-29 DIAGNOSIS — N183 Chronic kidney disease, stage 3 unspecified: Secondary | ICD-10-CM | POA: Diagnosis not present

## 2020-10-01 DIAGNOSIS — J449 Chronic obstructive pulmonary disease, unspecified: Secondary | ICD-10-CM | POA: Diagnosis not present

## 2020-10-01 DIAGNOSIS — J9601 Acute respiratory failure with hypoxia: Secondary | ICD-10-CM | POA: Diagnosis not present

## 2020-10-01 DIAGNOSIS — R069 Unspecified abnormalities of breathing: Secondary | ICD-10-CM | POA: Diagnosis not present

## 2020-10-29 DIAGNOSIS — J9601 Acute respiratory failure with hypoxia: Secondary | ICD-10-CM | POA: Diagnosis not present

## 2020-10-29 DIAGNOSIS — R069 Unspecified abnormalities of breathing: Secondary | ICD-10-CM | POA: Diagnosis not present

## 2020-10-29 DIAGNOSIS — J449 Chronic obstructive pulmonary disease, unspecified: Secondary | ICD-10-CM | POA: Diagnosis not present

## 2020-11-09 DIAGNOSIS — H401131 Primary open-angle glaucoma, bilateral, mild stage: Secondary | ICD-10-CM | POA: Diagnosis not present

## 2020-11-21 DIAGNOSIS — J9610 Chronic respiratory failure, unspecified whether with hypoxia or hypercapnia: Secondary | ICD-10-CM | POA: Diagnosis not present

## 2020-11-21 DIAGNOSIS — E1169 Type 2 diabetes mellitus with other specified complication: Secondary | ICD-10-CM | POA: Diagnosis not present

## 2020-11-21 DIAGNOSIS — G4733 Obstructive sleep apnea (adult) (pediatric): Secondary | ICD-10-CM | POA: Diagnosis not present

## 2020-11-21 DIAGNOSIS — N183 Chronic kidney disease, stage 3 unspecified: Secondary | ICD-10-CM | POA: Diagnosis not present

## 2020-11-21 DIAGNOSIS — I480 Paroxysmal atrial fibrillation: Secondary | ICD-10-CM | POA: Diagnosis not present

## 2020-11-21 DIAGNOSIS — M810 Age-related osteoporosis without current pathological fracture: Secondary | ICD-10-CM | POA: Diagnosis not present

## 2020-11-21 DIAGNOSIS — E7800 Pure hypercholesterolemia, unspecified: Secondary | ICD-10-CM | POA: Diagnosis not present

## 2020-11-21 DIAGNOSIS — J449 Chronic obstructive pulmonary disease, unspecified: Secondary | ICD-10-CM | POA: Diagnosis not present

## 2020-11-21 DIAGNOSIS — I10 Essential (primary) hypertension: Secondary | ICD-10-CM | POA: Diagnosis not present

## 2020-11-21 DIAGNOSIS — I4891 Unspecified atrial fibrillation: Secondary | ICD-10-CM | POA: Diagnosis not present

## 2020-11-21 DIAGNOSIS — I5032 Chronic diastolic (congestive) heart failure: Secondary | ICD-10-CM | POA: Diagnosis not present

## 2020-11-29 ENCOUNTER — Other Ambulatory Visit: Payer: Self-pay | Admitting: Thoracic Surgery (Cardiothoracic Vascular Surgery)

## 2020-11-29 DIAGNOSIS — I712 Thoracic aortic aneurysm, without rupture, unspecified: Secondary | ICD-10-CM

## 2020-11-29 DIAGNOSIS — J449 Chronic obstructive pulmonary disease, unspecified: Secondary | ICD-10-CM | POA: Diagnosis not present

## 2020-11-29 DIAGNOSIS — R069 Unspecified abnormalities of breathing: Secondary | ICD-10-CM | POA: Diagnosis not present

## 2020-11-29 DIAGNOSIS — R079 Chest pain, unspecified: Secondary | ICD-10-CM

## 2020-11-29 DIAGNOSIS — J9601 Acute respiratory failure with hypoxia: Secondary | ICD-10-CM | POA: Diagnosis not present

## 2020-12-07 DIAGNOSIS — H401131 Primary open-angle glaucoma, bilateral, mild stage: Secondary | ICD-10-CM | POA: Diagnosis not present

## 2020-12-14 DIAGNOSIS — I10 Essential (primary) hypertension: Secondary | ICD-10-CM | POA: Diagnosis not present

## 2020-12-14 DIAGNOSIS — N183 Chronic kidney disease, stage 3 unspecified: Secondary | ICD-10-CM | POA: Diagnosis not present

## 2020-12-14 DIAGNOSIS — M810 Age-related osteoporosis without current pathological fracture: Secondary | ICD-10-CM | POA: Diagnosis not present

## 2020-12-14 DIAGNOSIS — E1169 Type 2 diabetes mellitus with other specified complication: Secondary | ICD-10-CM | POA: Diagnosis not present

## 2020-12-14 DIAGNOSIS — I5030 Unspecified diastolic (congestive) heart failure: Secondary | ICD-10-CM | POA: Diagnosis not present

## 2020-12-14 DIAGNOSIS — I480 Paroxysmal atrial fibrillation: Secondary | ICD-10-CM | POA: Diagnosis not present

## 2020-12-14 DIAGNOSIS — I4891 Unspecified atrial fibrillation: Secondary | ICD-10-CM | POA: Diagnosis not present

## 2020-12-14 DIAGNOSIS — E7800 Pure hypercholesterolemia, unspecified: Secondary | ICD-10-CM | POA: Diagnosis not present

## 2020-12-14 DIAGNOSIS — I5032 Chronic diastolic (congestive) heart failure: Secondary | ICD-10-CM | POA: Diagnosis not present

## 2020-12-22 DIAGNOSIS — I10 Essential (primary) hypertension: Secondary | ICD-10-CM | POA: Diagnosis not present

## 2020-12-25 DIAGNOSIS — E1169 Type 2 diabetes mellitus with other specified complication: Secondary | ICD-10-CM | POA: Diagnosis not present

## 2020-12-25 DIAGNOSIS — E7800 Pure hypercholesterolemia, unspecified: Secondary | ICD-10-CM | POA: Diagnosis not present

## 2020-12-25 DIAGNOSIS — I5032 Chronic diastolic (congestive) heart failure: Secondary | ICD-10-CM | POA: Diagnosis not present

## 2020-12-25 DIAGNOSIS — I10 Essential (primary) hypertension: Secondary | ICD-10-CM | POA: Diagnosis not present

## 2020-12-25 DIAGNOSIS — I5030 Unspecified diastolic (congestive) heart failure: Secondary | ICD-10-CM | POA: Diagnosis not present

## 2020-12-25 DIAGNOSIS — I480 Paroxysmal atrial fibrillation: Secondary | ICD-10-CM | POA: Diagnosis not present

## 2020-12-25 DIAGNOSIS — M810 Age-related osteoporosis without current pathological fracture: Secondary | ICD-10-CM | POA: Diagnosis not present

## 2020-12-25 DIAGNOSIS — I4891 Unspecified atrial fibrillation: Secondary | ICD-10-CM | POA: Diagnosis not present

## 2020-12-25 DIAGNOSIS — N183 Chronic kidney disease, stage 3 unspecified: Secondary | ICD-10-CM | POA: Diagnosis not present

## 2021-01-09 ENCOUNTER — Ambulatory Visit: Payer: Medicare Other | Admitting: Thoracic Surgery (Cardiothoracic Vascular Surgery)

## 2021-01-09 ENCOUNTER — Encounter: Payer: Self-pay | Admitting: Thoracic Surgery (Cardiothoracic Vascular Surgery)

## 2021-01-09 ENCOUNTER — Other Ambulatory Visit: Payer: Self-pay

## 2021-01-09 ENCOUNTER — Ambulatory Visit
Admission: RE | Admit: 2021-01-09 | Discharge: 2021-01-09 | Disposition: A | Discharge status: Home / Self Care | Payer: Medicare Other | Source: Ambulatory Visit | Attending: Thoracic Surgery (Cardiothoracic Vascular Surgery) | Admitting: Thoracic Surgery (Cardiothoracic Vascular Surgery)

## 2021-01-09 VITALS — BP 135/78 | HR 99 | Resp 20 | Ht 60.0 in | Wt 204.0 lb

## 2021-01-09 DIAGNOSIS — I712 Thoracic aortic aneurysm, without rupture, unspecified: Secondary | ICD-10-CM

## 2021-01-09 DIAGNOSIS — I700 Atherosclerosis of aorta: Secondary | ICD-10-CM | POA: Diagnosis not present

## 2021-01-09 MED ORDER — IOPAMIDOL (ISOVUE-370) INJECTION 76%
75.0000 mL | Freq: Once | INTRAVENOUS | Status: AC | PRN
  Administered 2021-01-09: 75 mL via INTRAVENOUS

## 2021-01-09 NOTE — Progress Notes (Signed)
[address]       [name] [address]             [phone]     HPI: [name] returns for follow-up of her descending thoracic aneurysms  [name] is a 70 year old woman with a past history of hypertension, hyperlipidemia, tobacco abuse, interstitial lung disease, COPD, oxygen dependent chronic respiratory failure, persistent atrial fibrillation, coronary atherosclerosis, chronic diastolic heart failure, thoracic aortic atherosclerosis, descending thoracic and thoracoabdominal aortic aneurysms.  She was found to have a penetrating ulcer of the distal arch, descending thoracic and thoracoabdominal aneurysms on a CT for ILD.  She has been followed since that time.  The last saw her and November 2021.  Her aneurysms were stable.  Her biggest issue was her ILD.  She continues to have dyspnea, but it is stable.  She is still using 2 L of nasal cannula at baseline.  She does turned up to 3 to walk or go up a flight of stairs.  She has neck pain that is unchanged.  No chest pain, pressure, tightness, or back pain.  Legs do swell late in the day.  Past Medical History:  Diagnosis Date  . Chronic diastolic CHF (congestive heart failure) (HCC)   . Chronic respiratory failure (HCC)   . Coronary atherosclerosis    a. 2V by CT 10/2017.  . Emphysema of lung (HCC)   . Hypercholesterolemia   . Hypertension   . NSIP (nonspecific interstitial pneumonia) (HCC)   . Osteoarthritis   . Persistent atrial fibrillation (HCC)   . Thoracic aortic ectasia (HCC)     Current Outpatient Medications  Medication Sig Dispense Refill  . ACCU-CHEK GUIDE test strip See admin instructions.    Marland Kitchen acetaminophen (TYLENOL) 500 MG tablet Take 500 mg by mouth every 6 (six) hours as needed for moderate pain.    Marland Kitchen ascorbic acid (VITAMIN C) 500 MG tablet Take 500 mg by mouth daily. Takes one tablet every other day    . atorvastatin (LIPITOR) 20 MG tablet SMARTSIG:1 Tablet(s) By Mouth Every  Evening    . brimonidine (ALPHAGAN) 0.2 % ophthalmic solution 1 drop 2 (two) times daily.    . calcium citrate-vitamin D (CITRACAL+D) 315-200 MG-UNIT tablet Take 1 tablet by mouth daily.    . cholecalciferol (VITAMIN D3) 25 MCG (1000 UNIT) tablet Take 1,000 Units by mouth daily. Takes one tablet every other day    . diltiazem (CARTIA XT) 240 MG 24 hr capsule Take 1 capsule (240 mg total) by mouth daily. 90 capsule 3  . dofetilide (TIKOSYN) 500 MCG capsule TAKE ONE CAPSULE BY MOUTH TWICE DAILY 60 capsule 6  . doxycycline (VIBRA-TABS) 100 MG tablet Take 1 tablet (100 mg total) by mouth 2 (two) times daily. 20 tablet 0  . ELIQUIS 5 MG TABS tablet TAKE 1 TABLET BY MOUTH TWICE DAILY 180 tablet 1  . furosemide (LASIX) 40 MG tablet Take 1 tablet (40 mg total) by mouth daily. MUST SCH FOLLOW UP APPT FOR FURTHER REFILLS 1ST ATTEMPT 30 tablet 0  . GINKGO BILOBA COMPLEX PO Take by mouth. Takes one tablet every other day    . HYDROcodone-acetaminophen (NORCO/VICODIN) 5-325 MG tablet Take 1 tablet by mouth every 6 (six) hours as needed for moderate pain.    Marland Kitchen irbesartan (AVAPRO) 300 MG tablet Take 300 mg by mouth daily.    Marland Kitchen latanoprost (XALATAN) 0.005 % ophthalmic solution Place 1 drop into both eyes at bedtime.    Marland Kitchen  LORazepam (ATIVAN) 1 MG tablet Take 1 mg by mouth as needed.  1  . Multiple Vitamin (MULTIVITAMIN) capsule Take 1 capsule by mouth daily.    . Multiple Vitamins-Minerals (HAIR SKIN NAILS PO) Take by mouth. Takes one tablet every other day    . OXYGEN 2lpm 24/7 and 4 lpm with exertion [facility]    . UNABLE TO FIND BIPAP with 2lpm o2 at night  [facility]    . vitamin B-12 (CYANOCOBALAMIN) 1000 MCG tablet 1 tablet     No current facility-administered medications for this visit.    Physical Exam BP 135/78   Pulse 99   Resp 20   Ht 5' (1.524 m)   Wt 204 lb (92.5 kg)   SpO2 90% Comment: 2L O2 per [place]  BMI 39.73 kg/m  70 year old woman in no acute distress Nasal cannula oxygen in place No carotid  bruits Cardiac irregularly irregular, no murmur Lungs diminished breath sounds with faint crackles bilaterally No edema  Diagnostic Tests: I personally reviewed the CT images.  The official radiology reading is not yet available.  Severe thoracic aortic atherosclerosis with penetrating ulcer distal arch, 4.6 cm in mid descending aorta, 4.3 cm thoracoabdominal component, severe ILD.  Impression: [name] is a 70 year old woman  with a past history of hypertension, hyperlipidemia, tobacco abuse, interstitial lung disease, COPD, oxygen dependent chronic respiratory failure, persistent atrial fibrillation, coronary atherosclerosis, chronic diastolic heart failure, thoracic aortic atherosclerosis, descending thoracic and thoracoabdominal aortic aneurysms.  Thoracic aortic atherosclerosis/penetrating ulcer distal arch/descending and thoracoabdominal aneurysms-stable in size and appearance.  Needs continued semiannual follow-up.  Blood pressure control is paramount.  She would not be a candidate for open surgery but might potentially be a candidate for stent grafting.  Hypertension-blood pressure well controlled on current regimen  ILD-symptoms stable, followed by Dr. Sherene Warner.   Plan: Return in 6 months with CT angio of chest  I spent 20 minutes in review of records, images, and in consultation with [name] today.  Loreli Slot, MD [facility] [phone]

## 2021-01-10 ENCOUNTER — Other Ambulatory Visit (HOSPITAL_COMMUNITY): Payer: Self-pay | Admitting: Nurse Practitioner

## 2021-01-23 DIAGNOSIS — I10 Essential (primary) hypertension: Secondary | ICD-10-CM | POA: Diagnosis not present

## 2021-02-01 ENCOUNTER — Ambulatory Visit (HOSPITAL_COMMUNITY): Payer: Medicare Other | Admitting: Nurse Practitioner

## 2021-02-12 DIAGNOSIS — I5032 Chronic diastolic (congestive) heart failure: Secondary | ICD-10-CM | POA: Diagnosis not present

## 2021-02-12 DIAGNOSIS — I712 Thoracic aortic aneurysm, without rupture: Secondary | ICD-10-CM | POA: Diagnosis not present

## 2021-02-12 DIAGNOSIS — J8410 Pulmonary fibrosis, unspecified: Secondary | ICD-10-CM | POA: Diagnosis not present

## 2021-02-12 DIAGNOSIS — I480 Paroxysmal atrial fibrillation: Secondary | ICD-10-CM | POA: Diagnosis not present

## 2021-02-12 DIAGNOSIS — G4733 Obstructive sleep apnea (adult) (pediatric): Secondary | ICD-10-CM | POA: Diagnosis not present

## 2021-02-15 ENCOUNTER — Other Ambulatory Visit: Payer: Self-pay

## 2021-02-15 ENCOUNTER — Ambulatory Visit (HOSPITAL_COMMUNITY)
Admission: RE | Admit: 2021-02-15 | Discharge: 2021-02-15 | Disposition: A | Discharge status: Home / Self Care | Payer: Medicare Other | Source: Ambulatory Visit | Attending: Nurse Practitioner | Admitting: Nurse Practitioner

## 2021-02-15 ENCOUNTER — Encounter (HOSPITAL_COMMUNITY): Payer: Self-pay | Admitting: Nurse Practitioner

## 2021-02-15 VITALS — BP 150/86 | HR 62 | Ht 60.0 in | Wt 209.4 lb

## 2021-02-15 DIAGNOSIS — Z87891 Personal history of nicotine dependence: Secondary | ICD-10-CM | POA: Insufficient documentation

## 2021-02-15 DIAGNOSIS — J439 Emphysema, unspecified: Secondary | ICD-10-CM | POA: Insufficient documentation

## 2021-02-15 DIAGNOSIS — I5032 Chronic diastolic (congestive) heart failure: Secondary | ICD-10-CM | POA: Diagnosis not present

## 2021-02-15 DIAGNOSIS — D6869 Other thrombophilia: Secondary | ICD-10-CM | POA: Diagnosis not present

## 2021-02-15 DIAGNOSIS — Z8249 Family history of ischemic heart disease and other diseases of the circulatory system: Secondary | ICD-10-CM | POA: Insufficient documentation

## 2021-02-15 DIAGNOSIS — I4819 Other persistent atrial fibrillation: Secondary | ICD-10-CM | POA: Diagnosis not present

## 2021-02-15 DIAGNOSIS — Z7901 Long term (current) use of anticoagulants: Secondary | ICD-10-CM | POA: Insufficient documentation

## 2021-02-15 DIAGNOSIS — I2510 Atherosclerotic heart disease of native coronary artery without angina pectoris: Secondary | ICD-10-CM | POA: Insufficient documentation

## 2021-02-15 DIAGNOSIS — I4891 Unspecified atrial fibrillation: Secondary | ICD-10-CM | POA: Diagnosis not present

## 2021-02-15 DIAGNOSIS — E7800 Pure hypercholesterolemia, unspecified: Secondary | ICD-10-CM | POA: Diagnosis not present

## 2021-02-15 DIAGNOSIS — I110 Hypertensive heart disease with heart failure: Secondary | ICD-10-CM | POA: Insufficient documentation

## 2021-02-15 LAB — BASIC METABOLIC PANEL
Anion gap: 9 (ref 5–15)
BUN: 20 mg/dL (ref 8–23)
CO2: 28 mmol/L (ref 22–32)
Calcium: 9.5 mg/dL (ref 8.9–10.3)
Chloride: 105 mmol/L (ref 98–111)
Creatinine, Ser: 0.89 mg/dL (ref 0.44–1.00)
GFR, Estimated: >60 mL/min (ref >60)
Glucose, Bld: 108 mg/dL — ABNORMAL HIGH (ref 70–99)
Potassium: 4.8 mmol/L (ref 3.5–5.1)
Sodium: 142 mmol/L (ref 135–145)

## 2021-02-15 LAB — MAGNESIUM: Magnesium: 2.1 mg/dL (ref 1.7–2.4)

## 2021-02-15 NOTE — Progress Notes (Signed)
Date:  02/15/2021   ID:  [name], DOB 1950/11/08, MRN [id number]  Location:  In office  Provider location: [address] [place], [place] [address] Evaluation Performed:  Follow up   PCP:  [name], MD  Primary Cardiologist:  Dr. [name] Primary Electrophysiologist: Dr. [name]   CC: afib/tikosyn surveillance   History of Present Illness: [name] is a 70 y.o. female who presents for a f/u tikosyn  visit today.   Pt reports that she is doing well with only 2 short afib episodes last month. otherwise staying in SR, and remains compliant with Tikosyn and  Eliquis. CHA2DS2VASc Score of 3. No bleeding issues. She is feeling more short of breath, h/o pulmonary fibrosis and she is wanting to move up her appointment with him,continues on O2 via nasal cannula.    Today, she denies symptoms of palpitations, chest pain, orthopnea, PND, lower extremity edema, claudication, dizziness, presyncope, syncope, bleeding, or neurologic sequela.+shortness of breath. The patient is tolerating medications without difficulties and is otherwise without complaint today.   she denies symptoms of cough, fevers, chills, or new SOB worrisome for COVID 19.    Atrial Fibrillation Risk Factors:  she does have symptoms or diagnosis of sleep apnea. she is compliant with BiPAP therapy. she does not have a history of rheumatic fever. she does not have a history of alcohol use. The patient does not have a history of early familial atrial fibrillation or other arrhythmias.  she has a BMI of Body mass index is 40.9 kg/m.Marland Kitchen Filed Weights   02/15/21 1107  Weight: 95 kg    Past Medical History:  Diagnosis Date   Chronic diastolic CHF (congestive heart failure) (HCC)    Chronic respiratory failure (HCC)    Coronary atherosclerosis    a. 2V by CT 10/2017.   Emphysema of lung (HCC)    Hypercholesterolemia    Hypertension    NSIP (nonspecific interstitial pneumonia) (HCC)     Osteoarthritis    Persistent atrial fibrillation (HCC)    Thoracic aortic ectasia [facility])    Past Surgical History:  Procedure Laterality Date   CARDIOVERSION N/A 07/25/2016   Procedure: CARDIOVERSION;  Surgeon: Pricilla Riffle, MD;  Location: [facility] ENDOSCOPY;  Service: Cardiovascular;  Laterality: N/A;   CARDIOVERSION N/A 09/14/2016   Procedure: CARDIOVERSION;  Surgeon: Hillis Range, MD;  Location: [facility] OR;  Service: Cardiovascular;  Laterality: N/A;   CATARACT EXTRACTION Bilateral    LEG SURGERY Right    TEE WITHOUT CARDIOVERSION N/A 07/25/2016   Procedure: TRANSESOPHAGEAL ECHOCARDIOGRAM (TEE);  Surgeon: Pricilla Riffle, MD;  Location: [facility] ENDOSCOPY;  Service: Cardiovascular;  Laterality: N/A;     Current Outpatient Medications  Medication Sig Dispense Refill   ACCU-CHEK GUIDE test strip See admin instructions.     acetaminophen (TYLENOL) 500 MG tablet Take 500 mg by mouth every 6 (six) hours as needed for moderate pain.     ascorbic acid (VITAMIN C) 500 MG tablet Take 500 mg by mouth daily. Takes one tablet every other day     atorvastatin (LIPITOR) 20 MG tablet SMARTSIG:1 Tablet(s) By Mouth Every Evening     brimonidine (ALPHAGAN) 0.2 % ophthalmic solution 1 drop 2 (two) times daily.     calcium citrate-vitamin D (CITRACAL+D) 315-200 MG-UNIT tablet Take 1 tablet by mouth daily.     cholecalciferol (VITAMIN D3) 25 MCG (1000 UNIT) tablet Take 1,000 Units by mouth daily. Takes one tablet every other day  diltiazem (CARTIA XT) 240 MG 24 hr capsule Take 1 capsule (240 mg total) by mouth daily. 90 capsule 3   dofetilide (TIKOSYN) 500 MCG capsule TAKE ONE CAPSULE BY MOUTH TWICE DAILY 60 capsule 11   ELIQUIS 5 MG TABS tablet TAKE 1 TABLET BY MOUTH TWICE DAILY 180 tablet 1   furosemide (LASIX) 40 MG tablet Take 1 tablet (40 mg total) by mouth daily. MUST SCH FOLLOW UP APPT FOR FURTHER REFILLS 1ST ATTEMPT 30 tablet 0   GINKGO BILOBA COMPLEX PO Take by mouth. Takes one tablet every other day      HYDROcodone-acetaminophen (NORCO/VICODIN) 5-325 MG tablet Take 1 tablet by mouth every 6 (six) hours as needed for moderate pain.     irbesartan (AVAPRO) 300 MG tablet Take 300 mg by mouth daily.     LORazepam (ATIVAN) 1 MG tablet Take 1 mg by mouth as needed.  1   Multiple Vitamin (MULTIVITAMIN) capsule Take 1 capsule by mouth daily.     Multiple Vitamins-Minerals (HAIR SKIN NAILS PO) Take by mouth. Takes one tablet every other day     OXYGEN 2lpm 24/7 and 4 lpm with exertion AHC     ROCKLATAN 0.02-0.005 % SOLN Apply 1 drop to eye at bedtime.     UNABLE TO FIND BIPAP with 2lpm o2 at night  A[facility]     vitamin B-12 (CYANOCOBALAMIN) 1000 MCG tablet Take 1,000 mcg by mouth every other day.     No current facility-administered medications for this encounter.    Allergies:   Oxycodone hcl, Levaquin [levofloxacin in d5w], and Percocet [oxycodone-acetaminophen]   Social History:  The patient  reports that she quit smoking about 10 years ago. Her smoking use included cigarettes. She started smoking about 54 years ago. She has a 43.00 pack-year smoking history. She has never used smokeless tobacco. She reports that she does not drink alcohol and does not use drugs.   Family History:  The patient's  family history includes Heart attack in her sister; Heart disease in her father.    ROS:  Please see the history of present illness.   All other systems are personally reviewed and negative.   Exam: GEN- The patient is well appearing, alert and oriented x 3 today.   Head- normocephalic, atraumatic Eyes-  Sclera clear, conjunctiva pink Ears- hearing intact Oropharynx- clear Neck- supple, no JVP Lymph- no cervical lymphadenopathy Lungs- Clear to ausculation bilaterally, normal work of breathing Heart- Regular rate and rhythm, no murmurs, rubs or gallops, PMI not laterally displaced GI- soft, NT, ND, + BS Extremities- no clubbing, cyanosis, or 1t pitting edema( improved) MS- no significant deformity  or atrophy Skin- no rash or lesion Psych- euthymic mood, full affect Neuro- strength and sensation are intact       Recent Labs: 03/14/2020: Hemoglobin 15.5; Platelets 188 08/24/2020: BUN 13; Creatinine, Ser 0.84; Magnesium 2.4; Potassium 5.1; Sodium 144  personally reviewed    Other studies personally reviewed: Ekg- Normal sinus rhythm Normal ECG Vent. rate 62 BPM PR interval 152 ms QRS duration 84 ms QT/QTc 416/422 ms      ASSESSMENT AND PLAN:  1.  H/o persisitent atrial fibrillation Doing well maintaining SR on Tikosyn 500 mcg bid Continue Cardizem 240 mg a day Continue  eliquis 5 mg bid This patients CHA2DS2-VASc Score and unadjusted Ischemic Stroke Rate (% per year) is equal to 3.2 % stroke rate/year from a score of 3  Above score calculated as 1 point each if present [CHF, HTN, DM,  Vascular=MI/PAD/Aortic Plaque, Age if 50-74, or Female] Above score calculated as 2 points each if present [Age > 75, or Stroke/TIA/TE]  Bmet/mag/  today  Qtc stable  Current medicines are reviewed at length with the patient today.   The patient does not have concerns regarding her medicines.  The following changes were made today:  none  Labs/ tests ordered today include: none Orders Placed This Encounter  Procedures   Basic metabolic panel   Magnesium   EKG 12-Lead   2. Increased shortness of breath  H/o interstitial lung disease  She is going to call and move appointment up with Dr. Sherene Sires    Patient Risk:  after full review of this patients clinical status, I feel that they are at  Mod risk at this time.  F/u with Dr. Sherene Sires, Dr. [name] 7/21 and afib clinic in 6 months   Signed, [name] NP 02/15/2021 11:51 AM  [facility] [address] [place], [place] [address] [phone]

## 2021-02-19 DIAGNOSIS — J449 Chronic obstructive pulmonary disease, unspecified: Secondary | ICD-10-CM | POA: Diagnosis not present

## 2021-02-19 DIAGNOSIS — J9610 Chronic respiratory failure, unspecified whether with hypoxia or hypercapnia: Secondary | ICD-10-CM | POA: Diagnosis not present

## 2021-02-19 DIAGNOSIS — G4733 Obstructive sleep apnea (adult) (pediatric): Secondary | ICD-10-CM | POA: Diagnosis not present

## 2021-02-20 DIAGNOSIS — I10 Essential (primary) hypertension: Secondary | ICD-10-CM | POA: Diagnosis not present

## 2021-02-20 DIAGNOSIS — E1169 Type 2 diabetes mellitus with other specified complication: Secondary | ICD-10-CM | POA: Diagnosis not present

## 2021-02-20 DIAGNOSIS — I4891 Unspecified atrial fibrillation: Secondary | ICD-10-CM | POA: Diagnosis not present

## 2021-02-20 DIAGNOSIS — M810 Age-related osteoporosis without current pathological fracture: Secondary | ICD-10-CM | POA: Diagnosis not present

## 2021-02-20 DIAGNOSIS — I5032 Chronic diastolic (congestive) heart failure: Secondary | ICD-10-CM | POA: Diagnosis not present

## 2021-02-20 DIAGNOSIS — I5030 Unspecified diastolic (congestive) heart failure: Secondary | ICD-10-CM | POA: Diagnosis not present

## 2021-02-20 DIAGNOSIS — I480 Paroxysmal atrial fibrillation: Secondary | ICD-10-CM | POA: Diagnosis not present

## 2021-02-20 DIAGNOSIS — N183 Chronic kidney disease, stage 3 unspecified: Secondary | ICD-10-CM | POA: Diagnosis not present

## 2021-02-20 DIAGNOSIS — E7800 Pure hypercholesterolemia, unspecified: Secondary | ICD-10-CM | POA: Diagnosis not present

## 2021-02-28 DIAGNOSIS — R069 Unspecified abnormalities of breathing: Secondary | ICD-10-CM | POA: Diagnosis not present

## 2021-02-28 DIAGNOSIS — J449 Chronic obstructive pulmonary disease, unspecified: Secondary | ICD-10-CM | POA: Diagnosis not present

## 2021-02-28 DIAGNOSIS — J9601 Acute respiratory failure with hypoxia: Secondary | ICD-10-CM | POA: Diagnosis not present

## 2021-03-01 DIAGNOSIS — E1169 Type 2 diabetes mellitus with other specified complication: Secondary | ICD-10-CM | POA: Diagnosis not present

## 2021-03-01 DIAGNOSIS — N183 Chronic kidney disease, stage 3 unspecified: Secondary | ICD-10-CM | POA: Diagnosis not present

## 2021-03-01 DIAGNOSIS — J8410 Pulmonary fibrosis, unspecified: Secondary | ICD-10-CM | POA: Diagnosis not present

## 2021-03-01 DIAGNOSIS — E7800 Pure hypercholesterolemia, unspecified: Secondary | ICD-10-CM | POA: Diagnosis not present

## 2021-03-01 DIAGNOSIS — I5032 Chronic diastolic (congestive) heart failure: Secondary | ICD-10-CM | POA: Diagnosis not present

## 2021-03-01 DIAGNOSIS — G4733 Obstructive sleep apnea (adult) (pediatric): Secondary | ICD-10-CM | POA: Diagnosis not present

## 2021-03-01 DIAGNOSIS — I10 Essential (primary) hypertension: Secondary | ICD-10-CM | POA: Diagnosis not present

## 2021-03-01 DIAGNOSIS — G894 Chronic pain syndrome: Secondary | ICD-10-CM | POA: Diagnosis not present

## 2021-03-01 DIAGNOSIS — I4891 Unspecified atrial fibrillation: Secondary | ICD-10-CM | POA: Diagnosis not present

## 2021-03-01 DIAGNOSIS — Z0000 Encounter for general adult medical examination without abnormal findings: Secondary | ICD-10-CM | POA: Diagnosis not present

## 2021-03-01 DIAGNOSIS — I712 Thoracic aortic aneurysm, without rupture: Secondary | ICD-10-CM | POA: Diagnosis not present

## 2021-03-01 DIAGNOSIS — M810 Age-related osteoporosis without current pathological fracture: Secondary | ICD-10-CM | POA: Diagnosis not present

## 2021-03-13 ENCOUNTER — Other Ambulatory Visit: Payer: Self-pay | Admitting: Interventional Cardiology

## 2021-03-14 NOTE — Progress Notes (Signed)
Cardiology Office Note   Date:  03/15/2021   ID:  [name], [name] 1951-08-08, MRN [id number]  PCP:  [name], MD    No chief complaint on file.  AFib  Wt Readings from Last 3 Encounters:  03/15/21 208 lb 12.8 oz (94.7 kg)  02/15/21 209 lb 6.4 oz (95 kg)  01/09/21 204 lb (92.5 kg)       History of Present Illness: [name] is a 70 y.o. female  with a past medical history significant for hypertension, hypercholesterolemia, tobacco abuse (quit 2012), interstitial lung disease (NSIP), COPD, chronic respiratory failure, persistent atrial fibrillation, coronary atherosclerosis, chronic diastolic congestive heart failure, and osteoarthritis.   In the past, it was noted that: "She wears oxygen continuously, typically.  She sometimes removes it if she is at rest for a while and sats are above 90%."   An irregular heartbeat was deteceted in 2017. ECG was done showing atrial fibrillation in 11/17.  She was admittted with CHF as well at that time.  She had a TEE/CV. She went back to AFib that same night.     Eventually started on Tikosyn and followed in AFib clinic.  TOlerated Tikosyn well.     3/19 CT scan showed 2 vessel atherosclerosis and mildly dilated thoracic aneurysm. No anginal sx.     She is followed by Dr. Sherene Sires for NSIP, emphysema.  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  She recently had a CT to follow-up the aortic aneurysm and it was noted to be larger by report.  Followed by Dr. Dorris Fetch for aneurysm.   Since the last visit, everything has been stable.  She has seen Dr. Sherene Sires and Dr. Dorris Fetch.   BP Home readings are well controlled, 120-130 systolic.    Past Medical History:  Diagnosis Date   Chronic diastolic CHF (congestive heart failure) (HCC)    Chronic respiratory failure (HCC)    Coronary atherosclerosis    a. 2V by CT 10/2017.   Emphysema of lung (HCC)    Hypercholesterolemia     Hypertension    NSIP (nonspecific interstitial pneumonia) (HCC)    Osteoarthritis    Persistent atrial fibrillation (HCC)    Thoracic aortic ectasia [facility])     Past Surgical History:  Procedure Laterality Date   CARDIOVERSION N/A 07/25/2016   Procedure: CARDIOVERSION;  Surgeon: Pricilla Riffle, MD;  Location: [facility] ENDOSCOPY;  Service: Cardiovascular;  Laterality: N/A;   CARDIOVERSION N/A 09/14/2016   Procedure: CARDIOVERSION;  Surgeon: Hillis Range, MD;  Location: [facility] OR;  Service: Cardiovascular;  Laterality: N/A;   CATARACT EXTRACTION Bilateral    LEG SURGERY Right    TEE WITHOUT CARDIOVERSION N/A 07/25/2016   Procedure: TRANSESOPHAGEAL ECHOCARDIOGRAM (TEE);  Surgeon: Pricilla Riffle, MD;  Location: [facility] ENDOSCOPY;  Service: Cardiovascular;  Laterality: N/A;     Current Outpatient Medications  Medication Sig Dispense Refill   ACCU-CHEK GUIDE test strip See admin instructions.     acetaminophen (TYLENOL) 500 MG tablet Take 500 mg by mouth every 6 (six) hours as needed for moderate pain.     ascorbic acid (VITAMIN C) 500 MG tablet Take 500 mg by mouth daily. Takes one tablet every other day     atorvastatin (LIPITOR) 20 MG tablet SMARTSIG:1 Tablet(s) By Mouth Every Evening     brimonidine (ALPHAGAN) 0.2 % ophthalmic solution 1 drop 2 (two) times daily.     calcium citrate-vitamin  D (CITRACAL+D) 315-200 MG-UNIT tablet Take 1 tablet by mouth daily.     cholecalciferol (VITAMIN D3) 25 MCG (1000 UNIT) tablet Take 1,000 Units by mouth daily. Takes one tablet every other day     diltiazem (CARTIA XT) 240 MG 24 hr capsule Take 1 capsule (240 mg total) by mouth daily. 90 capsule 3   dofetilide (TIKOSYN) 500 MCG capsule TAKE ONE CAPSULE BY MOUTH TWICE DAILY 60 capsule 11   ELIQUIS 5 MG TABS tablet TAKE 1 TABLET BY MOUTH TWICE DAILY 180 tablet 1   furosemide (LASIX) 40 MG tablet Take 1 tablet (40 mg total) by mouth daily. MUST SCH FOLLOW UP APPT FOR FURTHER REFILLS 1ST ATTEMPT 30 tablet 0   GINKGO  BILOBA COMPLEX PO Take by mouth. Takes one tablet every other day     HYDROcodone-acetaminophen (NORCO/VICODIN) 5-325 MG tablet Take 1 tablet by mouth every 6 (six) hours as needed for moderate pain.     irbesartan (AVAPRO) 300 MG tablet Take 300 mg by mouth daily.     LORazepam (ATIVAN) 1 MG tablet Take 1 mg by mouth as needed.  1   Multiple Vitamin (MULTIVITAMIN) capsule Take 1 capsule by mouth daily.     Multiple Vitamins-Minerals (HAIR SKIN NAILS PO) Take by mouth. Takes one tablet every other day     OXYGEN 2lpm 24/7 and 4 lpm with exertion A[facility]     ROCKLATAN 0.02-0.005 % SOLN Apply 1 drop to eye at bedtime.     UNABLE TO FIND BIPAP with 2lpm o2 at night  [facility]     vitamin B-12 (CYANOCOBALAMIN) 1000 MCG tablet Take 1,000 mcg by mouth every other day.     No current facility-administered medications for this visit.    Allergies:   Oxycodone hcl, Levaquin [levofloxacin in d5w], and Percocet [oxycodone-acetaminophen]    Social History:  The patient  reports that she quit smoking about 10 years ago. Her smoking use included cigarettes. She started smoking about 54 years ago. She has a 43.00 pack-year smoking history. She has never used smokeless tobacco. She reports that she does not drink alcohol and does not use drugs.   Family History:  The patient's family history includes Heart attack in her sister; Heart disease in her father.    ROS:  Please see the history of present illness.   Otherwise, review of systems are positive for chronic DOE.   All other systems are reviewed and negative.    PHYSICAL EXAM: VS:  BP (!) 150/74   Pulse 79   Ht 5' (1.524 m)   Wt 208 lb 12.8 oz (94.7 kg)   SpO2 92%   BMI 40.78 kg/m  , BMI Body mass index is 40.78 kg/m. GEN: Well nourished, well developed, in no acute distress; wearing oxygen HEENT: normal Neck: no JVD, carotid bruits, or masses Cardiac: RRR; no murmurs, rubs, or gallops,no edema  Respiratory:  clear to auscultation bilaterally,  short of breath with longer sentences- chronic GI: soft, nontender, nondistended, + BS MS: no deformity or atrophy Skin: warm and dry, no rash Neuro:  Strength and sensation are intact Psych: euthymic mood, full affect   EKG:   The ekg ordered 6/22 demonstrates normal ECG   Recent Labs: 02/15/2021: BUN 20; Creatinine, Ser 0.89; Magnesium 2.1; Potassium 4.8; Sodium 142   Lipid Panel    Component Value Date/Time   CHOL 205 (H) 07/24/2016 0044   TRIG 50 07/24/2016 0044   HDL 64 07/24/2016 0044   CHOLHDL 3.2  07/24/2016 0044   VLDL 10 07/24/2016 0044   LDLCALC 131 (H) 07/24/2016 0044     Other studies Reviewed: Additional studies/ records that were reviewed today with results demonstrating: labs reviewed.   ASSESSMENT AND PLAN:  AFib: Tikosyn to maintain NSR. Rare palpitations. Seen in AFib clinic. Chronic diastolic heart failure: Appears euvolemic.  Mild ankle swelling.  Anticoagulated: No bleeding issues.  Hemoglobin stable. COPD: Also has OSA.  Using CPAP.  Using oxygen 24/7.  Coronary calcium: No angina.  THoracic aortic aneurysm Followed with CVTS. Staable.  Hyperlipidemia: LDL 70 in Jan 2022. Continue atorvastatin. High fiber diet, plant based.  Prediabetes : healthy diet.    Current medicines are reviewed at length with the patient today.  The patient concerns regarding her medicines were addressed.  The following changes have been made:  No change  Labs/ tests ordered today include:  No orders of the defined types were placed in this encounter.   Recommend 150 minutes/week of aerobic exercise Low fat, low carb, high fiber diet recommended  Disposition:   FU in 1 year   Signed, Lance Muss, MD  03/15/2021 9:44 AM    [facility] [address] [place], [place], [place]  [address] Phone: [phone]; Fax: [phone]

## 2021-03-15 ENCOUNTER — Ambulatory Visit: Payer: Medicare Other | Admitting: Interventional Cardiology

## 2021-03-15 ENCOUNTER — Encounter: Payer: Self-pay | Admitting: Interventional Cardiology

## 2021-03-15 ENCOUNTER — Other Ambulatory Visit: Payer: Self-pay

## 2021-03-15 VITALS — BP 150/74 | HR 79 | Ht 60.0 in | Wt 208.8 lb

## 2021-03-15 DIAGNOSIS — Z7901 Long term (current) use of anticoagulants: Secondary | ICD-10-CM | POA: Diagnosis not present

## 2021-03-15 DIAGNOSIS — I4819 Other persistent atrial fibrillation: Secondary | ICD-10-CM | POA: Diagnosis not present

## 2021-03-15 DIAGNOSIS — D6869 Other thrombophilia: Secondary | ICD-10-CM

## 2021-03-15 DIAGNOSIS — I5032 Chronic diastolic (congestive) heart failure: Secondary | ICD-10-CM

## 2021-03-15 DIAGNOSIS — E782 Mixed hyperlipidemia: Secondary | ICD-10-CM

## 2021-03-15 DIAGNOSIS — I77810 Thoracic aortic ectasia: Secondary | ICD-10-CM

## 2021-03-15 DIAGNOSIS — I2510 Atherosclerotic heart disease of native coronary artery without angina pectoris: Secondary | ICD-10-CM | POA: Diagnosis not present

## 2021-03-15 DIAGNOSIS — I2584 Coronary atherosclerosis due to calcified coronary lesion: Secondary | ICD-10-CM | POA: Diagnosis not present

## 2021-03-15 NOTE — Patient Instructions (Signed)
Medication Instructions:  Your physician recommends that you continue on your current medications as directed. Please refer to the Current Medication list given to you today.  *If you need a refill on your cardiac medications before your next appointment, please call your pharmacy*   Lab Work: none If you have labs (blood work) drawn today and your tests are completely normal, you will receive your results only by: MyChart Message (if you have MyChart) OR A paper copy in the mail If you have any lab test that is abnormal or we need to change your treatment, we will call you to review the results.   Testing/Procedures: none   Follow-Up: At [facility], you and your health needs are our priority.  As part of our continuing mission to provide you with exceptional heart care, we have created designated Provider Care Teams.  These Care Teams include your primary Cardiologist (physician) and Advanced Practice Providers (APPs -  Physician Assistants and Nurse Practitioners) who all work together to provide you with the care you need, when you need it.  We recommend signing up for the patient portal called "MyChart".  Sign up information is provided on this After Visit Summary.  MyChart is used to connect with patients for Virtual Visits (Telemedicine).  Patients are able to view lab/test results, encounter notes, upcoming appointments, etc.  Non-urgent messages can be sent to your provider as well.   To learn more about what you can do with MyChart, go to [email or web address].    Your next appointment:   12 month(s)  The format for your next appointment:   In Person  Provider:   You may see Jayadeep [name], MD or one of the following Advanced Practice Providers on your designated Care Team:   Dayna Dunn, PA-C Michele Lenze, PA-C   Other Instructions High-Fiber Eating Plan Fiber, also called dietary fiber, is a type of carbohydrate. It is found foods such as fruits, vegetables,  whole grains, and beans. A high-fiber diet can have many health benefits. Your health care provider may recommend a high-fiber diet to help: Prevent constipation. Fiber can make your bowel movements more regular. Lower your cholesterol. Relieve the following conditions: Inflammation of veins in the anus (hemorrhoids). Inflammation of specific areas of the digestive tract (uncomplicated diverticulosis). A problem of the large intestine, also called the colon, that sometimes causes pain and diarrhea (irritable bowel syndrome, or IBS). Prevent overeating as part of a weight-loss plan. Prevent heart disease, type 2 diabetes, and certain cancers. What are tips for following this plan? Reading food labels  Check the nutrition facts label on food products for the amount of dietary fiber. Choose foods that have 5 grams of fiber or more per serving. The goals for recommended daily fiber intake include: Men (age 50 or younger): 34-38 g. Men (over age 50): 28-34 g. Women (age 50 or younger): 25-28 g. Women (over age 50): 22-25 g. Your daily fiber goal is _____________ g. Shopping Choose whole fruits and vegetables instead of processed forms, such as apple juice or applesauce. Choose a wide variety of high-fiber foods such as avocados, lentils, oats, and kidney beans. Read the nutrition facts label of the foods you choose. Be aware of foods with added fiber. These foods often have high sugar and sodium amounts per serving. Cooking Use whole-grain flour for baking and cooking. Cook with brown rice instead of white rice. Meal planning Start the day with a breakfast that is high in fiber, such as a cereal that   contains 5 g of fiber or more per serving. Eat breads and cereals that are made with whole-grain flour instead of refined flour or white flour. Eat brown rice, bulgur wheat, or millet instead of white rice. Use beans in place of meat in soups, salads, and pasta dishes. Be sure that half of the  grains you eat each day are whole grains. General information You can get the recommended daily intake of dietary fiber by: Eating a variety of fruits, vegetables, grains, nuts, and beans. Taking a fiber supplement if you are not able to take in enough fiber in your diet. It is better to get fiber through food than from a supplement. Gradually increase how much fiber you consume. If you increase your intake of dietary fiber too quickly, you may have bloating, cramping, or gas. Drink plenty of water to help you digest fiber. Choose high-fiber snacks, such as berries, raw vegetables, nuts, and popcorn. What foods should I eat? Fruits Berries. Pears. Apples. Oranges. Avocado. Prunes and raisins. Dried figs. Vegetables Sweet potatoes. Spinach. Kale. Artichokes. Cabbage. Broccoli. Cauliflower.Green peas. Carrots. Squash. Grains Whole-grain breads. Multigrain cereal. Oats and oatmeal. Brown rice. Barley.Bulgur wheat. Millet. Quinoa. Bran muffins. Popcorn. Rye wafer crackers. Meats and other proteins Navy beans, kidney beans, and pinto beans. Soybeans. Split peas. Lentils. Nutsand seeds. Dairy Fiber-fortified yogurt. Beverages Fiber-fortified soy milk. Fiber-fortified orange juice. Other foods Fiber bars. The items listed above may not be a complete list of recommended foods and beverages. Contact a dietitian for more information. What foods should I avoid? Fruits Fruit juice. Cooked, strained fruit. Vegetables Fried potatoes. Canned vegetables. Well-cooked vegetables. Grains White bread. Pasta made with refined flour. White rice. Meats and other proteins Fatty cuts of meat. Fried chicken or fried fish. Dairy Milk. Yogurt. Cream cheese. Sour cream. Fats and oils Butters. Beverages Soft drinks. Other foods Cakes and pastries. The items listed above may not be a complete list of foods and beverages to avoid. Talk with your dietitian about what choices are best for you. Summary Fiber  is a type of carbohydrate. It is found in foods such as fruits, vegetables, whole grains, and beans. A high-fiber diet has many benefits. It can help to prevent constipation, lower blood cholesterol, aid weight loss, and reduce your risk of heart disease, diabetes, and certain cancers. Increase your intake of fiber gradually. Increasing fiber too quickly may cause cramping, bloating, and gas. Drink plenty of water while you increase the amount of fiber you consume. The best sources of fiber include whole fruits and vegetables, whole grains, nuts, seeds, and beans. This information is not intended to replace advice given to you by your health care provider. Make sure you discuss any questions you have with your healthcare provider. Document Revised: 12/16/2019 Document Reviewed: 12/16/2019 Elsevier Patient Education  2022 [employer]   

## 2021-03-19 DIAGNOSIS — E7800 Pure hypercholesterolemia, unspecified: Secondary | ICD-10-CM | POA: Diagnosis not present

## 2021-03-19 DIAGNOSIS — I10 Essential (primary) hypertension: Secondary | ICD-10-CM | POA: Diagnosis not present

## 2021-03-19 DIAGNOSIS — M810 Age-related osteoporosis without current pathological fracture: Secondary | ICD-10-CM | POA: Diagnosis not present

## 2021-03-19 DIAGNOSIS — I480 Paroxysmal atrial fibrillation: Secondary | ICD-10-CM | POA: Diagnosis not present

## 2021-03-19 DIAGNOSIS — I5032 Chronic diastolic (congestive) heart failure: Secondary | ICD-10-CM | POA: Diagnosis not present

## 2021-03-19 DIAGNOSIS — E1169 Type 2 diabetes mellitus with other specified complication: Secondary | ICD-10-CM | POA: Diagnosis not present

## 2021-03-22 ENCOUNTER — Other Ambulatory Visit: Payer: Self-pay | Admitting: Interventional Cardiology

## 2021-03-26 ENCOUNTER — Encounter: Payer: Self-pay | Admitting: Internal Medicine

## 2021-03-26 ENCOUNTER — Other Ambulatory Visit: Payer: Self-pay

## 2021-03-26 ENCOUNTER — Ambulatory Visit: Payer: Medicare Other | Admitting: Internal Medicine

## 2021-03-26 DIAGNOSIS — J9612 Chronic respiratory failure with hypercapnia: Secondary | ICD-10-CM

## 2021-03-26 DIAGNOSIS — J9611 Chronic respiratory failure with hypoxia: Secondary | ICD-10-CM | POA: Diagnosis not present

## 2021-03-26 DIAGNOSIS — J8410 Pulmonary fibrosis, unspecified: Secondary | ICD-10-CM

## 2021-03-26 NOTE — Progress Notes (Signed)
Subjective:     Patient ID: [name], female   DOB: July 10, 1951    MRN: [id number]    Brief patient profile:  69   yowf quit smoking 2012 due to some cough/wheezing improved p quit at wt 180 and no obst on spirometry 06/25/16  referred to pulmonary clinic 06/25/2016 by Toni   [name] s/p admit:   Admit date: 02/05/2016 Discharge date: 02/08/2016    Discharge Diagnoses:  Principal Problem:   Acute respiratory failure with hypoxia (HCC) Active Problems:   HTN (hypertension)   PNA (pneumonia)   Glaucoma       History of Present Illness  06/25/2016 1st [facility] office visit/ [name]   ? NSIP  Chief Complaint  Patient presents with   Consult visit    COPD consult per [name]- Pt states SOB w/ excertion. No congestion or tightness. Occasional coughing at times.   onset of sob spring 2017 x one month prior to admit on 02/05/16 gradually worse while on lisinopril  In April 2017 was using [facility] parking but still able to do [employer]  Now sob across the room  Sob occurs at rest if speaking  Able to lie down rotated on to left side otherwise chokes/ smothers rec Stop lotensin and start ibesartan 150 mg one daily  And your breathing should gradually improve  Please schedule a follow up office visit in 2 weeks, sooner if needed to see [name] to recheck your blood pressure  Add double the lasix dose for now    10/29/2016 acute extended ov/[name] re: new onset resp failure/ pt maint on eliquis but no rsp rx  Chief Complaint  Patient presents with   Acute Visit    Pt c/o increased SOB and lower o2 sats for the past wk.   gradually worse sob x one week assoc with low 02 sats though baseline doe = MMRC3 = can't walk 100 yards even at a slow pace at a flat grade s stopping due to sob   No sign cough/ no chills no cp or vomiting  Leg swelling no worse than usual  rec Please see patient coordinator before you leave today  to schedule start 02 24/7 at 2lpm and ambulatory 02  titration to see if eligible for POC  Wear 02 2lpm 24/7 for now at 2lpm and increase to 4lpm with exertion Use your incentive spirometer as much as you can  Please remember to go to the lab and x-ray department downstairs in the basement  for your tests - we will call you with the results when they are available. I will arrange follow up for you here if indicated, otherwise just return to cardiology as planned  Late add:  Needs hrct next p taking one extra lasix daily x one week > NSIP    11/18/2017  f/u ov/[name] re:  ?NSIP last bird exp 2-3 years  02 2lpm bipap and 2lpm with activity  Chief Complaint  Patient presents with   Follow-up    PFT's done today. Breathing is unchanged.   Dyspnea:  Still shopping at [employer]/ wearing 2lpm with activity  Cough: none Sleep: sleep ok on cpap/ 02 SABA use:  n/a [name]  Now following for Cards rec Avoid bird exposure  I will let [name] know about your Aortic enlargement but the main treatment is to lose weight and keep your blood pressure down            01/05/2020  f/u ov/[name] re: nsip / paf / does not want vaccine "afraid of it from what I've heard")  Chief Complaint  Patient presents with   Annual Exam    some coughing in AM, some phlem comes up, some SOB with walking   Dyspnea:  Walking outside and down the street and [place] most of the time on 3lpm  POC  Cough: mostly in am  Sleeping: on back one pillow and bipap from sleep doctor [name]  SABA use: none 02: 2lpm at hs/  None at rest and 3 when active  rec No change in 02 sleeping and not needed sitting.  Most of the time 3lpm should be fine on your portable  However, make sure you check your oxygen saturations occasionally at highest level of activity to be sure it stays over 90% and adjust upward to maintain this level if needed but remember to turn it back to previous settings when you stop (to conserve your supply) = back to zero flow. If losing ground with  your activities or not getting adequate (over 90% saturations) please call for early follow up. I strongly recommend you reconsider the Covid vaccine, especially the pfizer or moderna whichever is being offered.    09/25/2020  Acute  ov/[name] re: NSIP / afib/02 24/7  @ 2lpm , still not vaccinated  Chief Complaint  Patient presents with   Acute Visit  Dyspnea:  Room to room  Cough: worse than usual  X 10 days, dark s cp or sob over baseline Sleeping: bipap per [name] SABA use: none 02: 2lpm 24/7  Rec Make sure you check your oxygen saturation  at your highest level of activity  to be sure it stays over 90% and adjust  02 flow upward to maintain this level if needed but remember to turn it back to previous settings when you stop (to conserve your supply).    Doxycycline 100 mg twice daily x 10 days  > call if cough not better when done      03/26/2021  f/u ov/[name] re: NSIP / afib/02 24/7  @ 2lpm  Chief Complaint  Patient presents with   Follow-up    Over the past month has noticed increased DOE.    Dyspnea:  able to do [employer] slow pushing cart only once a month Cough: none Sleeping: on bipapand 3lpm  SABA use: none  02: 3-4lpm 24/7  Covid status:   never vaccinated    No obvious day to day or daytime variability or assoc excess/ purulent sputum or mucus plugs or hemoptysis or cp or chest tightness, subjective wheeze or overt sinus or hb symptoms.   Sleeping  without nocturnal  or early am exacerbation  of respiratory  c/o's or need for noct saba. Also denies any obvious fluctuation of symptoms with weather or environmental changes or other aggravating or alleviating factors except as outlined above   No unusual exposure hx or h/o childhood pna/ asthma or knowledge of premature birth.  Current Allergies, Complete Past Medical History, Past Surgical History, Family History, and Social History were reviewed in [employer] record.  ROS  The following are  not active complaints unless bolded Hoarseness, sore throat, dysphagia, dental problems, itching, sneezing,  nasal congestion or discharge of excess mucus or purulent secretions, ear ache,   fever, chills, sweats, unintended wt loss or wt gain, classically pleuritic or exertional cp,  orthopnea pnd or arm/hand swelling  or leg swelling, presyncope, palpitations,  abdominal pain, anorexia, nausea, vomiting, diarrhea  or change in bowel habits or change in bladder habits, change in stools or change in urine, dysuria, hematuria,  rash, arthralgias, visual complaints, headache, numbness, weakness or ataxia or problems with walking or coordination,  change in mood or  memory.        Current Meds  Medication Sig   ACCU-CHEK GUIDE test strip See admin instructions.   acetaminophen (TYLENOL) 500 MG tablet Take 500 mg by mouth every 6 (six) hours as needed for moderate pain.   ascorbic acid (VITAMIN C) 500 MG tablet Take 500 mg by mouth daily. Takes one tablet every other day   atorvastatin (LIPITOR) 20 MG tablet SMARTSIG:1 Tablet(s) By Mouth Every Evening   brimonidine (ALPHAGAN) 0.2 % ophthalmic solution 1 drop 2 (two) times daily.   calcium citrate-vitamin D (CITRACAL+D) 315-200 MG-UNIT tablet Take 1 tablet by mouth daily.   cholecalciferol (VITAMIN D3) 25 MCG (1000 UNIT) tablet Take 1,000 Units by mouth daily. Takes one tablet every other day   diltiazem (CARDIZEM CD) 240 MG 24 hr capsule TAKE ONE CAPSULE BY MOUTH ONCE DAILY   dofetilide (TIKOSYN) 500 MCG capsule TAKE ONE CAPSULE BY MOUTH TWICE DAILY   ELIQUIS 5 MG TABS tablet TAKE 1 TABLET BY MOUTH TWICE DAILY   furosemide (LASIX) 40 MG tablet Take 1 tablet (40 mg total) by mouth daily. MUST SCH FOLLOW UP APPT FOR FURTHER REFILLS 1ST ATTEMPT   GINKGO BILOBA COMPLEX PO Take by mouth. Takes one tablet every other day   HYDROcodone-acetaminophen (NORCO/VICODIN) 5-325 MG tablet Take 1 tablet by mouth every 6 (six) hours as needed for moderate pain.    irbesartan (AVAPRO) 300 MG tablet Take 300 mg by mouth daily.   LORazepam (ATIVAN) 1 MG tablet Take 1 mg by mouth as needed.   Multiple Vitamin (MULTIVITAMIN) capsule Take 1 capsule by mouth daily.   Multiple Vitamins-Minerals (HAIR SKIN NAILS PO) Take by mouth. Takes one tablet every other day   OXYGEN 2lpm 24/7 and 4 lpm with exertion AHC   ROCKLATAN 0.02-0.005 % SOLN Apply 1 drop to eye at bedtime.   UNABLE TO FIND BIPAP with 2lpm o2 at night  [facility]   vitamin B-12 (CYANOCOBALAMIN) 1000 MCG tablet Take 1,000 mcg by mouth every other day.                      Objective:   Physical Exam    03/26/2021           208 09/25/2020         206 01/05/2020         202  01/06/2019         193  05/21/2018         190  11/18/2017         198  06/06/2017       200  .03/06/2017        204  12/05/2016         203   07/23/2016     208   06/25/16 204 lb 1.6 oz (92.6 kg)  02/06/16 204 lb 9.4 oz (92.8 kg)  02/25/08 163 lb (73.9 kg)    Vital signs reviewed  03/26/2021  - Note at rest 02 sats  94% on 3lpm pulsed   General appearance:    obese amb wf nad     HEENT : pt wearing mask not removed for exam due to covid -19 concerns.  NECK :  without JVD/Nodes/TM/ nl carotid upstrokes bilaterally   LUNGS: no acc muscle use,  Nl contour chest with coarse insp crackles in bases  bilaterally without cough on insp or exp maneuvers   CV:  RRR  no s3 or murmur or increase in P2, and trace bilateral ankle edema   ABD:  obese soft and nontender with nl inspiratory excursion in the supine position. No bruits or organomegaly appreciated, bowel sounds nl  MS:  Nl gait/ ext warm without deformities, calf tenderness, cyanosis or clubbing No obvious joint restrictions   SKIN: warm and dry without lesions    NEURO:  alert, approp, nl sensorium with  no motor or cerebellar deficits apparent.       I personally reviewed images and agree with radiology impression as follows:   Chest CTa  01/09/21  Coarse  subpleural scarring and scattered ground-glass opacities peripherally most marked in the lung bases, stable since previous       Assessment:

## 2021-03-26 NOTE — Assessment & Plan Note (Signed)
HCO3  10/29/2016  = 32   Qualified for 02  10/29/16  Saturations on Room Air at Rest =87% - Patient Saturations on 2lpm o2= 93% - Sats walking on 4lpm 90% Started bipap per Dr Leonides Sake 11/29/16  - 12/05/2016  Patient Saturations on Room Air at Rest =88% Patient Saturations on 2lpm o2= 93% - sats ok 06/06/2017 on 6 mw on 4lpm  - HCO3 10/01/17 = 25 so hypercarbia clearly better  - 05/21/2018   Walked RA  2 laps @ 185 ft each stopped due to desats to 86% corrected on 2lpm / slow pace  - 01/05/2020 RA 92% and on 3lpm POC sats 90% p 750 ft slow pace / stopping last lap due to sob  - 03/26/2021   Walked on 2lpm  x one lap =  approx 250 ft -@ avg pace stopped due to sob with sats 90%     rec as of 03/26/2021 = 3lpm hs with cpap and tritrate daytime to keep > 90%  Advised: Make sure you check your oxygen saturation  at your highest level of activity  to be sure it stays over 90% and adjust  02 flow upward to maintain this level if needed but remember to turn it back to previous settings when you stop (to conserve your supply).    Each maintenance medication was reviewed in detail including emphasizing most importantly the difference between maintenance and prns and under what circumstances the prns are to be triggered using an action plan format where appropriate.  Total time for H and P, chart review, counseling,  directly observing portions of ambulatory 02 saturation study/ and generating customized AVS unique to this office visit / same day charting = 27 min

## 2021-03-26 NOTE — Patient Instructions (Signed)
I very strongly recommend you get the moderna or pfizer vaccine as soon as possible based on your risk of dying from the virus  and the proven safety and benefit of these vaccines against even the delta and omicron variants.  This can save your life as well as  those of your loved ones,  especially if they are also not vaccinated.    Make sure you check your oxygen saturation  at your highest level of activity  to be sure it stays over 90% and adjust  02 flow upward to maintain this level if needed but remember to turn it back to previous settings when you stop (to conserve your supply).   Please schedule a follow up visit in 6 months but call sooner if needed

## 2021-03-26 NOTE — Assessment & Plan Note (Signed)
Body mass index is 42.01 kg/m.  -  trending up  Lab Results  Component Value Date   TSH 1.66 10/29/2016      Contributing to doe and risk of GERD >>>   reviewed the need and the process to achieve and maintain neg calorie balance > defer f/u primary care including intermittently monitoring thyroid status

## 2021-03-26 NOTE — Assessment & Plan Note (Signed)
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 However, there does appear to be a mild craniocaudal gradient. The possibility of early usual interstitial pneumonia (UIP) although not favored, is not entirely excluded. Accordingly, repeat high-resolution chest CT is recommended in 12 months  - PFT's  03/06/2017  F VC  1.77 (63%)  no % improvement from saba p 0  prior to study with DLCO  67/66 % corrects to 99  % for alv volume   - 74mw  06/06/2017   304 m on 4lpm s stopping or desats - HRCT chest 11/13/17 1. 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 -  11/18/2017  HSP serology/ collagen vasc profile >  Neg  - PFT's  11/18/2017  FVC 1.71 (61%)  with DLCO  64/59c  % corrects to 94 % for alv volume  And ERV 34% - CTa Chest 01/09/21 no change   No clinical or radiographic progression of dz  High risk M/M  With covid > again advised on vaccination but declines

## 2021-03-31 DIAGNOSIS — J9601 Acute respiratory failure with hypoxia: Secondary | ICD-10-CM | POA: Diagnosis not present

## 2021-03-31 DIAGNOSIS — J449 Chronic obstructive pulmonary disease, unspecified: Secondary | ICD-10-CM | POA: Diagnosis not present

## 2021-03-31 DIAGNOSIS — R069 Unspecified abnormalities of breathing: Secondary | ICD-10-CM | POA: Diagnosis not present

## 2021-04-09 ENCOUNTER — Other Ambulatory Visit (HOSPITAL_COMMUNITY): Payer: Self-pay

## 2021-04-09 DIAGNOSIS — H401131 Primary open-angle glaucoma, bilateral, mild stage: Secondary | ICD-10-CM | POA: Diagnosis not present

## 2021-04-09 MED ORDER — APIXABAN 5 MG PO TABS: 5.0000 mg | ORAL_TABLET | Freq: Two times a day (BID) | ORAL | 0 refills | Status: DC

## 2021-04-18 DIAGNOSIS — I5030 Unspecified diastolic (congestive) heart failure: Secondary | ICD-10-CM | POA: Diagnosis not present

## 2021-04-18 DIAGNOSIS — I10 Essential (primary) hypertension: Secondary | ICD-10-CM | POA: Diagnosis not present

## 2021-04-18 DIAGNOSIS — E7800 Pure hypercholesterolemia, unspecified: Secondary | ICD-10-CM | POA: Diagnosis not present

## 2021-04-18 DIAGNOSIS — E1169 Type 2 diabetes mellitus with other specified complication: Secondary | ICD-10-CM | POA: Diagnosis not present

## 2021-04-18 DIAGNOSIS — I480 Paroxysmal atrial fibrillation: Secondary | ICD-10-CM | POA: Diagnosis not present

## 2021-04-18 DIAGNOSIS — N183 Chronic kidney disease, stage 3 unspecified: Secondary | ICD-10-CM | POA: Diagnosis not present

## 2021-04-18 DIAGNOSIS — M810 Age-related osteoporosis without current pathological fracture: Secondary | ICD-10-CM | POA: Diagnosis not present

## 2021-04-18 DIAGNOSIS — I5032 Chronic diastolic (congestive) heart failure: Secondary | ICD-10-CM | POA: Diagnosis not present

## 2021-04-25 DIAGNOSIS — I10 Essential (primary) hypertension: Secondary | ICD-10-CM | POA: Diagnosis not present

## 2021-05-01 DIAGNOSIS — J9601 Acute respiratory failure with hypoxia: Secondary | ICD-10-CM | POA: Diagnosis not present

## 2021-05-01 DIAGNOSIS — J449 Chronic obstructive pulmonary disease, unspecified: Secondary | ICD-10-CM | POA: Diagnosis not present

## 2021-05-01 DIAGNOSIS — R069 Unspecified abnormalities of breathing: Secondary | ICD-10-CM | POA: Diagnosis not present

## 2021-05-18 DIAGNOSIS — E7800 Pure hypercholesterolemia, unspecified: Secondary | ICD-10-CM | POA: Diagnosis not present

## 2021-05-18 DIAGNOSIS — M810 Age-related osteoporosis without current pathological fracture: Secondary | ICD-10-CM | POA: Diagnosis not present

## 2021-05-18 DIAGNOSIS — I480 Paroxysmal atrial fibrillation: Secondary | ICD-10-CM | POA: Diagnosis not present

## 2021-05-18 DIAGNOSIS — N183 Chronic kidney disease, stage 3 unspecified: Secondary | ICD-10-CM | POA: Diagnosis not present

## 2021-05-18 DIAGNOSIS — E1169 Type 2 diabetes mellitus with other specified complication: Secondary | ICD-10-CM | POA: Diagnosis not present

## 2021-05-18 DIAGNOSIS — I5030 Unspecified diastolic (congestive) heart failure: Secondary | ICD-10-CM | POA: Diagnosis not present

## 2021-05-18 DIAGNOSIS — I10 Essential (primary) hypertension: Secondary | ICD-10-CM | POA: Diagnosis not present

## 2021-05-18 DIAGNOSIS — I5032 Chronic diastolic (congestive) heart failure: Secondary | ICD-10-CM | POA: Diagnosis not present

## 2021-05-24 DIAGNOSIS — J9610 Chronic respiratory failure, unspecified whether with hypoxia or hypercapnia: Secondary | ICD-10-CM | POA: Diagnosis not present

## 2021-05-24 DIAGNOSIS — G4733 Obstructive sleep apnea (adult) (pediatric): Secondary | ICD-10-CM | POA: Diagnosis not present

## 2021-05-24 DIAGNOSIS — J449 Chronic obstructive pulmonary disease, unspecified: Secondary | ICD-10-CM | POA: Diagnosis not present

## 2021-05-31 ENCOUNTER — Other Ambulatory Visit: Payer: Self-pay | Admitting: Thoracic Surgery (Cardiothoracic Vascular Surgery)

## 2021-05-31 DIAGNOSIS — R069 Unspecified abnormalities of breathing: Secondary | ICD-10-CM | POA: Diagnosis not present

## 2021-05-31 DIAGNOSIS — J449 Chronic obstructive pulmonary disease, unspecified: Secondary | ICD-10-CM | POA: Diagnosis not present

## 2021-05-31 DIAGNOSIS — I7123 Aneurysm of the descending thoracic aorta, without rupture: Secondary | ICD-10-CM

## 2021-05-31 DIAGNOSIS — J9601 Acute respiratory failure with hypoxia: Secondary | ICD-10-CM | POA: Diagnosis not present

## 2021-05-31 DIAGNOSIS — I77810 Thoracic aortic ectasia: Secondary | ICD-10-CM

## 2021-06-01 ENCOUNTER — Ambulatory Visit: Payer: Medicare Other | Admitting: Podiatry

## 2021-06-01 ENCOUNTER — Other Ambulatory Visit: Payer: Self-pay

## 2021-06-01 DIAGNOSIS — B351 Tinea unguium: Secondary | ICD-10-CM | POA: Diagnosis not present

## 2021-06-01 DIAGNOSIS — L600 Ingrowing nail: Secondary | ICD-10-CM

## 2021-06-01 DIAGNOSIS — M79674 Pain in right toe(s): Secondary | ICD-10-CM | POA: Diagnosis not present

## 2021-06-01 DIAGNOSIS — E1169 Type 2 diabetes mellitus with other specified complication: Secondary | ICD-10-CM | POA: Diagnosis not present

## 2021-06-01 DIAGNOSIS — M79675 Pain in left toe(s): Secondary | ICD-10-CM | POA: Diagnosis not present

## 2021-06-01 MED ORDER — CEPHALEXIN 500 MG PO CAPS: 500.0000 mg | ORAL_CAPSULE | Freq: Three times a day (TID) | ORAL | 0 refills | Status: DC

## 2021-06-01 NOTE — Patient Instructions (Signed)

## 2021-06-05 ENCOUNTER — Other Ambulatory Visit (HOSPITAL_COMMUNITY): Payer: Self-pay

## 2021-06-05 MED ORDER — APIXABAN 5 MG PO TABS: 5.0000 mg | ORAL_TABLET | Freq: Two times a day (BID) | ORAL | 0 refills | Status: DC

## 2021-06-06 NOTE — Progress Notes (Signed)
Subjective:   Patient ID: [name], female   DOB: 70 y.o.   MRN: [id number]   HPI 70 year old female presents the office today for concerns of ingrown toenail to the right big toe, lateral border of third toe as well.  She has noticed some redness and swelling to the toenail sites as well as discomfort.  No purulence.  She had tried trimming the nails previously.  She is also on soaking Epson salts, antibiotic ointment.  She is unsure of her last A1c but last time she checked her blood sugar it was 93.  She is also asking for the other nails be trimmed as well as are also thickened elongated she has difficulty trimming them but not having any redness or swelling or any drainage.  On her own she stopped her Eliquis on her own 2 days ago.  Review of Systems  All other systems reviewed and are negative.  Past Medical History:  Diagnosis Date   Chronic diastolic CHF (congestive heart failure) (HCC)    Chronic respiratory failure (HCC)    Coronary atherosclerosis    a. 2V by CT 10/2017.   Emphysema of lung (HCC)    Hypercholesterolemia    Hypertension    NSIP (nonspecific interstitial pneumonia) (HCC)    Osteoarthritis    Persistent atrial fibrillation (HCC)    Thoracic aortic ectasia [facility])     Past Surgical History:  Procedure Laterality Date   CARDIOVERSION N/A 07/25/2016   Procedure: CARDIOVERSION;  Surgeon: Pricilla Riffle, MD;  Location: [facility] ENDOSCOPY;  Service: Cardiovascular;  Laterality: N/A;   CARDIOVERSION N/A 09/14/2016   Procedure: CARDIOVERSION;  Surgeon: Hillis Range, MD;  Location: [facility] OR;  Service: Cardiovascular;  Laterality: N/A;   CATARACT EXTRACTION Bilateral    LEG SURGERY Right    TEE WITHOUT CARDIOVERSION N/A 07/25/2016   Procedure: TRANSESOPHAGEAL ECHOCARDIOGRAM (TEE);  Surgeon: Pricilla Riffle, MD;  Location: [facility] ENDOSCOPY;  Service: Cardiovascular;  Laterality: N/A;     Current Outpatient Medications:    cephALEXin (KEFLEX) 500 MG capsule, Take 1 capsule  (500 mg total) by mouth 3 (three) times daily., Disp: 21 capsule, Rfl: 0   ACCU-CHEK GUIDE test strip, See admin instructions., Disp: , Rfl:    acetaminophen (TYLENOL) 500 MG tablet, Take 500 mg by mouth every 6 (six) hours as needed for moderate pain., Disp: , Rfl:    apixaban (ELIQUIS) 5 MG TABS tablet, Take 1 tablet (5 mg total) by mouth 2 (two) times daily., Disp: 56 tablet, Rfl: 0   ascorbic acid (VITAMIN C) 500 MG tablet, Take 500 mg by mouth daily. Takes one tablet every other day, Disp: , Rfl:    atorvastatin (LIPITOR) 20 MG tablet, SMARTSIG:1 Tablet(s) By Mouth Every Evening, Disp: , Rfl:    brimonidine (ALPHAGAN) 0.2 % ophthalmic solution, 1 drop 2 (two) times daily., Disp: , Rfl:    calcium citrate-vitamin D (CITRACAL+D) 315-200 MG-UNIT tablet, Take 1 tablet by mouth daily., Disp: , Rfl:    cholecalciferol (VITAMIN D3) 25 MCG (1000 UNIT) tablet, Take 1,000 Units by mouth daily. Takes one tablet every other day, Disp: , Rfl:    diltiazem (CARDIZEM CD) 240 MG 24 hr capsule, TAKE ONE CAPSULE BY MOUTH ONCE DAILY, Disp: 90 capsule, Rfl: 3   dofetilide (TIKOSYN) 500 MCG capsule, TAKE ONE CAPSULE BY MOUTH TWICE DAILY, Disp: 60 capsule, Rfl: 11   furosemide (LASIX) 40 MG tablet, Take 1 tablet (40 mg total) by mouth daily. MUST SCH FOLLOW UP APPT FOR  FURTHER REFILLS 1ST ATTEMPT, Disp: 30 tablet, Rfl: 0   GINKGO BILOBA COMPLEX PO, Take by mouth. Takes one tablet every other day, Disp: , Rfl:    HYDROcodone-acetaminophen (NORCO/VICODIN) 5-325 MG tablet, Take 1 tablet by mouth every 6 (six) hours as needed for moderate pain., Disp: , Rfl:    irbesartan (AVAPRO) 300 MG tablet, Take 300 mg by mouth daily., Disp: , Rfl:    LORazepam (ATIVAN) 1 MG tablet, Take 1 mg by mouth as needed., Disp: , Rfl: 1   Multiple Vitamin (MULTIVITAMIN) capsule, Take 1 capsule by mouth daily., Disp: , Rfl:    Multiple Vitamins-Minerals (HAIR SKIN NAILS PO), Take by mouth. Takes one tablet every other day, Disp: , Rfl:     OXYGEN, 2lpm 24/7 and 4 lpm with exertion A[facility], Disp: , Rfl:    ROCKLATAN 0.02-0.005 % SOLN, Apply 1 drop to eye at bedtime., Disp: , Rfl:    UNABLE TO FIND, BIPAP with 2lpm o2 at night  [facility], Disp: , Rfl:    vitamin B-12 (CYANOCOBALAMIN) 1000 MCG tablet, Take 1,000 mcg by mouth every other day., Disp: , Rfl:   Allergies  Allergen Reactions   Oxycodone Hcl Itching   Levaquin [Levofloxacin In D5w] Itching and Rash   Percocet [Oxycodone-Acetaminophen] Itching          Objective:  Physical Exam  General: AAO x3, NAD  Dermatological: In general nails are hypertrophic, dystrophic with yellow discoloration elongated causing discomfort x10.  Particular the right hallux as well as the third digit nails are ingrown causing discomfort there is localized edema and erythema but there is no purulence, ascending cellulitis.  No open lesions.  Vascular: Dorsalis Pedis artery and Posterior Tibial artery pedal pulses are 2/4 bilateral with immedate capillary fill time. There is no pain with calf compression, swelling, warmth, erythema.   Neruologic: Grossly intact via light touch bilateral.   Musculoskeletal: Tenderness to the toenails in particular on the ingrown toenail.  Muscular strength 5/5 in all groups tested bilateral.  Gait: Unassisted, Nonantalgic.       Assessment:   Symptomatic onychomycosis, ingrown toenails     Plan:  -Treatment options discussed including all alternatives, risks, and complications -Etiology of symptoms were discussed -At this time, recommended partial nail removal without chemical matricectomy to the right 1st and 3rd nails due to infection. Risks and complications were discussed with the patient for which they understand and  verbally consent to the procedure. Under sterile conditions a total of 3 mL of a mixture of 2% lidocaine plain and 0.5% Marcaine plain was infiltrated in a digital block fashion. Once anesthetized, the skin was prepped in sterile fashion. A  tourniquet was then applied. Next the lateral border of the hallux as well as the right third digit nail borders was sharply excised making sure to remove the entire offending nail border. Once the nail was  Removed, the area was debrided and the underlying skin was intact. The area was irrigated and hemostasis was obtained.  A dry sterile dressing was applied. After application of the dressing the tourniquet was removed and there is found to be an immediate capillary refill time to the digit. The patient tolerated the procedure well any complications. Post procedure instructions were discussed the patient for which he verbally understood. Follow-up in one week for nail check or sooner if any problems are to arise. Discussed signs/symptoms of worsening infection and directed to call the office immediately should any occur or go directly to the emergency  room. In the meantime, encouraged to call the office with any questions, concerns, changes symptoms. -Sharply debrided the remainder the nails x8 without any complications or bleeding. -Keflex  Vivi Barrack DPM

## 2021-06-08 ENCOUNTER — Other Ambulatory Visit: Payer: Self-pay

## 2021-06-08 ENCOUNTER — Ambulatory Visit (INDEPENDENT_AMBULATORY_CARE_PROVIDER_SITE_OTHER): Payer: Medicare Other | Admitting: Podiatry

## 2021-06-08 DIAGNOSIS — L600 Ingrowing nail: Secondary | ICD-10-CM

## 2021-06-08 DIAGNOSIS — E1169 Type 2 diabetes mellitus with other specified complication: Secondary | ICD-10-CM

## 2021-06-12 NOTE — Progress Notes (Signed)
Subjective: 70 year old female presents the office today for follow-up evaluation after undergoing ingrown toenail partial nail avulsions of the right hallux lateral border, third digit nail borders.  She states that she is doing much better.  There is still no drainage or bleeding.  The redness is improved as well.  Still finishing antibiotics.  She has no other concerns today.  No fevers or chills.  Objective: AAO x3, NAD DP/PT pulses palpable bilaterally, CRT less than 3 seconds Status post partial nail avulsions of the right hallux, third digit toenail.  Some faint localized edema and erythema still remains but overall improved.  There is no drainage or pus.  Small mount of granulation tissue present in the nailbed and scab is starting to form.  There is no probing, undermining or tunneling.  No pain on exam.  No pain with calf compression, swelling, warmth, erythema  Assessment: Status post partial nail avulsions, healing  Plan: -All treatment options discussed with the patient including all alternatives, risks, complications.  -Continue to wash the area soap and water daily.  Dry thoroughly apply a small amount of antibiotic ointment and a bandage during the day.  Continue offloading to the areas.  If not he will the next 1 to 2 weeks there is any issue to me know otherwise I will see her back as needed with ingrown toenails. -Patient encouraged to call the office with any questions, concerns, change in symptoms.   Vivi Barrack DPM

## 2021-07-01 DIAGNOSIS — R069 Unspecified abnormalities of breathing: Secondary | ICD-10-CM | POA: Diagnosis not present

## 2021-07-01 DIAGNOSIS — J449 Chronic obstructive pulmonary disease, unspecified: Secondary | ICD-10-CM | POA: Diagnosis not present

## 2021-07-01 DIAGNOSIS — J9601 Acute respiratory failure with hypoxia: Secondary | ICD-10-CM | POA: Diagnosis not present

## 2021-07-17 ENCOUNTER — Ambulatory Visit: Payer: Medicare Other | Admitting: Thoracic Surgery (Cardiothoracic Vascular Surgery)

## 2021-07-17 ENCOUNTER — Ambulatory Visit: Payer: Medicare Other | Admitting: Surgical

## 2021-07-17 ENCOUNTER — Other Ambulatory Visit: Payer: Self-pay

## 2021-07-17 ENCOUNTER — Other Ambulatory Visit: Payer: Self-pay | Admitting: Thoracic Surgery (Cardiothoracic Vascular Surgery)

## 2021-07-17 ENCOUNTER — Encounter: Payer: Self-pay | Admitting: Physician Assistant

## 2021-07-17 ENCOUNTER — Ambulatory Visit
Admission: RE | Admit: 2021-07-17 | Discharge: 2021-07-17 | Disposition: A | Discharge status: Home / Self Care | Payer: Medicare Other | Source: Ambulatory Visit | Attending: Thoracic Surgery (Cardiothoracic Vascular Surgery) | Admitting: Thoracic Surgery (Cardiothoracic Vascular Surgery)

## 2021-07-17 VITALS — BP 151/80 | HR 92 | Resp 20 | Ht 59.0 in | Wt 205.2 lb

## 2021-07-17 DIAGNOSIS — K8689 Other specified diseases of pancreas: Secondary | ICD-10-CM

## 2021-07-17 DIAGNOSIS — I7123 Aneurysm of the descending thoracic aorta, without rupture: Secondary | ICD-10-CM

## 2021-07-17 DIAGNOSIS — I712 Thoracic aortic aneurysm, without rupture, unspecified: Secondary | ICD-10-CM | POA: Diagnosis not present

## 2021-07-17 MED ORDER — IOPAMIDOL (ISOVUE-370) INJECTION 76%
75.0000 mL | Freq: Once | INTRAVENOUS | Status: AC | PRN
  Administered 2021-07-17: 75 mL via INTRAVENOUS

## 2021-07-17 NOTE — Progress Notes (Signed)
Subjective:    Patient ID: [name], female    DOB: 1951/07/21, 70 y.o.   MRN: [id number]  Chief Complaint  Patient presents with   Thoracic Aortic Aneurysm    6 month surveillance with CTA chest today    HPI Patient is in today for aortic aneurysm surveillance.  She is a 70 year old female with a past medical history of hypertension, hyperlipidemia and tobacco abuse as well as interstitial lung disease/COPD.  She is chronically oxygen dependent.  She also has a history of persistent atrial fibrillation, CAD and associated chronic diastolic heart failure.  She was found to have a penetrating ulcer of the distal arch, descending thoracic and thoracoabdominal aneurysms on CT scan that was done initially for ILD.  She is being followed with CT scans on a 74-month basis.  She remains medically stable.  She does note that her blood pressure is elevated at times.  She knows the importance of keeping this controlled and is in close follow-up with her primary care physician.  She is not having any significant symptoms of chest pain or back pain.  Past Medical History:  Diagnosis Date   Chronic diastolic CHF (congestive heart failure) (HCC)    Chronic respiratory failure (HCC)    Coronary atherosclerosis    a. 2V by CT 10/2017.   Emphysema of lung ([place])    Hypercholesterolemia    Hypertension    NSIP (nonspecific interstitial pneumonia) (HCC)    Osteoarthritis    Persistent atrial fibrillation ([place])    Thoracic aortic ectasia [facility])     Past Surgical History:  Procedure Laterality Date   CARDIOVERSION N/A 07/25/2016   Procedure: CARDIOVERSION;  Surgeon: Fay Records, MD;  Location: [place];  Service: Cardiovascular;  Laterality: N/A;   CARDIOVERSION N/A 09/14/2016   Procedure: CARDIOVERSION;  Surgeon: Thompson Grayer, MD;  Location: [facility] OR;  Service: Cardiovascular;  Laterality: N/A;   CATARACT EXTRACTION Bilateral    LEG SURGERY Right    TEE WITHOUT CARDIOVERSION N/A 07/25/2016    Procedure: TRANSESOPHAGEAL ECHOCARDIOGRAM (TEE);  Surgeon: Fay Records, MD;  Location: [facility] ENDOSCOPY;  Service: Cardiovascular;  Laterality: N/A;    Family History  Problem Relation Age of Onset   Heart disease Father    Heart attack Sister    Stroke Neg Hx     Social History   Socioeconomic History   Marital status: Widowed    Spouse name: Not on file   Number of children: Not on file   Years of education: Not on file   Highest education level: Not on file  Occupational History   Not on file  Tobacco Use   Smoking status: Former    Packs/day: 1.00    Years: 43.00    Pack years: 43.00    Types: Cigarettes    Start date: 08/26/1966    Quit date: 08/26/2010    Years since quitting: 10.8   Smokeless tobacco: Never  Vaping Use   Vaping Use: Never used  Substance and Sexual Activity   Alcohol use: No    Alcohol/week: 0.0 standard drinks   Drug use: No   Sexual activity: Not on file  Other Topics Concern   Not on file  Social History Narrative   Not on file   Social Determinants of Health   Financial Resource Strain: Not on file  Food Insecurity: Not on file  Transportation Needs: Not on file  Physical Activity: Not on file  Stress: Not on file  Social Connections: Not on file  Intimate Partner Violence: Not on file    Outpatient Medications Prior to Visit  Medication Sig Dispense Refill   ACCU-CHEK GUIDE test strip See admin instructions.     acetaminophen (TYLENOL) 500 MG tablet Take 500 mg by mouth every 6 (six) hours as needed for moderate pain.     apixaban (ELIQUIS) 5 MG TABS tablet Take 1 tablet (5 mg total) by mouth 2 (two) times daily. 56 tablet 0   ascorbic acid (VITAMIN C) 500 MG tablet Take 500 mg by mouth daily. Takes one tablet every other day     atorvastatin (LIPITOR) 20 MG tablet SMARTSIG:1 Tablet(s) By Mouth Every Evening     brimonidine (ALPHAGAN) 0.2 % ophthalmic solution 1 drop 2 (two) times daily.     calcium citrate-vitamin D (CITRACAL+D)  315-200 MG-UNIT tablet Take 1 tablet by mouth daily.     cephALEXin (KEFLEX) 500 MG capsule Take 1 capsule (500 mg total) by mouth 3 (three) times daily. 21 capsule 0   cholecalciferol (VITAMIN D3) 25 MCG (1000 UNIT) tablet Take 1,000 Units by mouth daily. Takes one tablet every other day     diltiazem (CARDIZEM CD) 240 MG 24 hr capsule TAKE ONE CAPSULE BY MOUTH ONCE DAILY 90 capsule 3   dofetilide (TIKOSYN) 500 MCG capsule TAKE ONE CAPSULE BY MOUTH TWICE DAILY 60 capsule 11   furosemide (LASIX) 40 MG tablet Take 1 tablet (40 mg total) by mouth daily. MUST SCH FOLLOW UP APPT FOR FURTHER REFILLS 1ST ATTEMPT 30 tablet 0   GINKGO BILOBA COMPLEX PO Take by mouth. Takes one tablet every other day     HYDROcodone-acetaminophen (NORCO/VICODIN) 5-325 MG tablet Take 1 tablet by mouth every 6 (six) hours as needed for moderate pain.     irbesartan (AVAPRO) 300 MG tablet Take 300 mg by mouth daily.     LORazepam (ATIVAN) 1 MG tablet Take 1 mg by mouth as needed.  1   Multiple Vitamin (MULTIVITAMIN) capsule Take 1 capsule by mouth daily.     Multiple Vitamins-Minerals (HAIR SKIN NAILS PO) Take by mouth. Takes one tablet every other day     OXYGEN 2lpm 24/7 and 4 lpm with exertion AHC     ROCKLATAN 0.02-0.005 % SOLN Apply 1 drop to eye at bedtime.     UNABLE TO FIND BIPAP with 2lpm o2 at night  A[facility]     vitamin B-12 (CYANOCOBALAMIN) 1000 MCG tablet Take 1,000 mcg by mouth every other day.     No facility-administered medications prior to visit.    Allergies  Allergen Reactions   Oxycodone Hcl Itching   Levaquin [Levofloxacin In D5w] Itching and Rash   Percocet [Oxycodone-Acetaminophen] Itching         Objective:    Physical Exam Constitutional:      General: She is not in acute distress.    Appearance: She is obese. She is ill-appearing. She is not toxic-appearing or diaphoretic.  HENT:     Head: Normocephalic and atraumatic.  Cardiovascular:     Rate and Rhythm: Rhythm irregular.      Heart sounds: No murmur heard.   No friction rub. No gallop.  Pulmonary:     Effort: Pulmonary effort is normal.     Breath sounds: No wheezing, rhonchi or rales.  Abdominal:     Palpations: There is no mass.     Tenderness: There is no abdominal tenderness.     Comments: obese  Neurological:  Mental Status: She is alert and oriented to person, place, and time.    BP (!) 151/80 (BP Location: Right Arm, Patient Position: Sitting, Cuff Size: Normal)   Pulse 92   Resp 20   Ht 4\' 11"  (1.499 m)   Wt 205 lb 3.2 oz (93.1 kg)   SpO2 91% Comment: 2LNC  BMI 41.45 kg/m  Wt Readings from Last 3 Encounters:  07/17/21 205 lb 3.2 oz (93.1 kg)  03/26/21 208 lb (94.3 kg)  03/15/21 208 lb 12.8 oz (94.7 kg)    Health Maintenance Due  Topic Date Due   COVID-19 Vaccine (1) Never done   FOOT EXAM  Never done   OPHTHALMOLOGY EXAM  Never done   Hepatitis C Screening  Never done   COLONOSCOPY (Pts 45-77yrs Insurance coverage will need to be confirmed)  Never done   MAMMOGRAM  Never done   DEXA SCAN  Never done   HEMOGLOBIN A1C  08/07/2016   Pneumonia Vaccine 35+ Years old (2 - PPSV23 if available, else PCV20) 01/25/2020   INFLUENZA VACCINE  03/26/2021    There are no preventive care reminders to display for this patient.   Lab Results  Component Value Date   TSH 1.66 10/29/2016   Lab Results  Component Value Date   WBC 9.6 03/14/2020   HGB 15.5 (H) 03/14/2020   HCT 46.7 (H) 03/14/2020   MCV 92.3 03/14/2020   PLT 188 03/14/2020   Lab Results  Component Value Date   NA 142 02/15/2021   K 4.8 02/15/2021   CO2 28 02/15/2021   GLUCOSE 108 (H) 02/15/2021   BUN 20 02/15/2021   CREATININE 0.89 02/15/2021   BILITOT 0.7 07/23/2016   ALKPHOS 72 07/23/2016   AST 25 07/23/2016   ALT 19 07/23/2016   PROT 6.6 07/23/2016   ALBUMIN 3.8 07/23/2016   CALCIUM 9.5 02/15/2021   ANIONGAP 9 02/15/2021   GFR 53.79 (L) 03/14/2017   Lab Results  Component Value Date   CHOL 205 (H)  07/24/2016   Lab Results  Component Value Date   HDL 64 07/24/2016   Lab Results  Component Value Date   LDLCALC 131 (H) 07/24/2016   Lab Results  Component Value Date   TRIG 50 07/24/2016   Lab Results  Component Value Date   CHOLHDL 3.2 07/24/2016   Lab Results  Component Value Date   HGBA1C 6.2 (H) 02/06/2016       Assessment & Plan:   Problem List Items Addressed This Visit   None Visit Diagnoses     Thoracic aortic aneurysm without rupture, unspecified part    -  Primary     CT ANGIO CHEST AORTA W/CM & OR WO/CM  Result Date: 07/17/2021 CLINICAL DATA:  Thoracic aortic aneurysm EXAM: CT ANGIOGRAPHY CHEST WITH CONTRAST TECHNIQUE: Multidetector CT imaging of the chest was performed using the standard protocol during bolus administration of intravenous contrast. Multiplanar CT image reconstructions and MIPs were obtained to evaluate the vascular anatomy. CONTRAST:  80mL ISOVUE-370 IOPAMIDOL (ISOVUE-370) INJECTION 76% COMPARISON:  01/09/2021 FINDINGS: Cardiovascular: There are scattered coronary artery calcifications. Ulcerated plaque is seen in the aortic arch distal to the origin of left subclavian with no significant interval change. There is aneurysmal dilation of descending thoracic aorta posterior to the left main pulmonary artery measuring 4.5 cm which appears slightly more prominent. There is aneurysmal dilation in the descending thoracic aorta and proximal abdominal aorta measuring 4.6 cm in diameter with slight increase in size. Mediastinum/Nodes: No  new significant lymphadenopathy seen. Lungs/Pleura: There is no focal pulmonary consolidation. There is mild ectasia of bronchi. Subtle increased interstitial markings are seen in the periphery of both lungs with no significant interval change. Upper Abdomen: There is fatty infiltration in the liver. There is minimal prominence of pancreatic duct. There is 2.9 x 1.9 cm low-density structure in the head of the pancreas. This  region was not included in the previous studies limiting evaluation for interval change. There is 7 mm low-density structure in the tail of pancreas which has not changed in comparison with the study of 01/06/2020. There few smooth marginated fluid density lesions in the visualized upper portions both kidneys. Musculoskeletal: No significant abnormality is seen. Review of the MIP images confirms the above findings. IMPRESSION: There is aneurysmal dilation of the ascending thoracic aorta at the level of left main pulmonary artery measuring 4.5 cm with interval slight increase in size. There is aneurysm in the lower thoracic aorta and upper abdominal aorta measuring 4.6 cm which is slightly more prominent. Ulcerated plaque seen in the aortic arch has not changed significantly. There is no demonstrable thoracic aortic dissection. There is 2.9 cm low-density structure in the head of the pancreas which is not fully evaluated. Follow-up multiphasic CT should be considered to rule out any neoplastic process. Other findings as described in the body of the report. Electronically Signed   By: Elmer Picker M.D.   On: 07/17/2021 11:29      No orders of the defined types were placed in this encounter.  Assessment/plan: Aneurysm is fairly stable as described above.  There is a slight increase in size.  There is no evidence of dissection and the full report is listed above.  Of significance there is a 2.9 cm low-density structure in the head of the pancreas.  We will obtain a multiphasic abdominal CT scan to further evaluate and refer to GI medicine for further evaluation.  We will obtain a CTA scan of the chest in 6 months and her next appointment will be with surgeon as the size of the aneurysm meets criteria.   Discussed the importance of blood pressure control and she understands quite well.  She will keep a log and close contact with primary care for medication adjustments/additions. John Giovanni, PA-C

## 2021-07-17 NOTE — Patient Instructions (Signed)
Close blood pressure control

## 2021-07-17 NOTE — Progress Notes (Signed)
See note per Gershon Crane PA-C

## 2021-07-18 DIAGNOSIS — J449 Chronic obstructive pulmonary disease, unspecified: Secondary | ICD-10-CM | POA: Diagnosis not present

## 2021-07-18 DIAGNOSIS — J9601 Acute respiratory failure with hypoxia: Secondary | ICD-10-CM | POA: Diagnosis not present

## 2021-07-18 DIAGNOSIS — J9610 Chronic respiratory failure, unspecified whether with hypoxia or hypercapnia: Secondary | ICD-10-CM | POA: Diagnosis not present

## 2021-07-18 DIAGNOSIS — R069 Unspecified abnormalities of breathing: Secondary | ICD-10-CM | POA: Diagnosis not present

## 2021-07-18 DIAGNOSIS — G4733 Obstructive sleep apnea (adult) (pediatric): Secondary | ICD-10-CM | POA: Diagnosis not present

## 2021-07-24 DIAGNOSIS — E1169 Type 2 diabetes mellitus with other specified complication: Secondary | ICD-10-CM | POA: Diagnosis not present

## 2021-07-24 DIAGNOSIS — E7800 Pure hypercholesterolemia, unspecified: Secondary | ICD-10-CM | POA: Diagnosis not present

## 2021-07-24 DIAGNOSIS — I480 Paroxysmal atrial fibrillation: Secondary | ICD-10-CM | POA: Diagnosis not present

## 2021-07-24 DIAGNOSIS — N183 Chronic kidney disease, stage 3 unspecified: Secondary | ICD-10-CM | POA: Diagnosis not present

## 2021-07-24 DIAGNOSIS — I10 Essential (primary) hypertension: Secondary | ICD-10-CM | POA: Diagnosis not present

## 2021-07-24 DIAGNOSIS — I4891 Unspecified atrial fibrillation: Secondary | ICD-10-CM | POA: Diagnosis not present

## 2021-07-24 DIAGNOSIS — M810 Age-related osteoporosis without current pathological fracture: Secondary | ICD-10-CM | POA: Diagnosis not present

## 2021-07-31 DIAGNOSIS — J9601 Acute respiratory failure with hypoxia: Secondary | ICD-10-CM | POA: Diagnosis not present

## 2021-07-31 DIAGNOSIS — R069 Unspecified abnormalities of breathing: Secondary | ICD-10-CM | POA: Diagnosis not present

## 2021-07-31 DIAGNOSIS — J449 Chronic obstructive pulmonary disease, unspecified: Secondary | ICD-10-CM | POA: Diagnosis not present

## 2021-08-08 ENCOUNTER — Other Ambulatory Visit: Payer: Medicare Other

## 2021-08-09 ENCOUNTER — Ambulatory Visit: Payer: Medicare Other | Admitting: Podiatry

## 2021-08-09 ENCOUNTER — Other Ambulatory Visit: Payer: Self-pay

## 2021-08-09 DIAGNOSIS — Z7901 Long term (current) use of anticoagulants: Secondary | ICD-10-CM | POA: Diagnosis not present

## 2021-08-09 DIAGNOSIS — L600 Ingrowing nail: Secondary | ICD-10-CM

## 2021-08-09 DIAGNOSIS — B351 Tinea unguium: Secondary | ICD-10-CM | POA: Diagnosis not present

## 2021-08-09 DIAGNOSIS — M79674 Pain in right toe(s): Secondary | ICD-10-CM

## 2021-08-09 DIAGNOSIS — M79675 Pain in left toe(s): Secondary | ICD-10-CM | POA: Diagnosis not present

## 2021-08-10 ENCOUNTER — Other Ambulatory Visit: Payer: Medicare Other

## 2021-08-12 NOTE — Progress Notes (Signed)
Subjective: 70 year old female presents the office today for concerns of thick, elongated toenails that become ingrown and she cannot trim them herself and causing discomfort.  She said the right third nail came off on its own but denies any open sores or swelling or redness or any drainage.  She denies any open lesions in general.  No other concerns today.  Objective: AAO x3, NAD DP/PT pulses palpable bilaterally, CRT less than 3 seconds Nails are mildly hypertrophic, dystrophic with yellow discoloration causing ingrowing of the nails on both nail borders on multiple toenails.  There is no edema, erythema or any signs of infection.  She has tenderness nails 1 through 5 bilaterally except for the right third toenail that has fallen off but appears it is starting to grow back again. No open lesions or pre-ulcerative lesions.  No pain with calf compression, swelling, warmth, erythema  Assessment: Symptomatic onychomycosis, ingrown toenails  Plan: -All treatment options discussed with the patient including all alternatives, risks, complications.  -Sharply debrided the nails x9 without any complications or bleeding. -Discussed daily foot inspection.  -Patient encouraged to call the office with any questions, concerns, change in symptoms.

## 2021-08-13 ENCOUNTER — Ambulatory Visit
Admission: RE | Admit: 2021-08-13 | Discharge: 2021-08-13 | Disposition: A | Discharge status: Home / Self Care | Payer: Medicare Other | Source: Ambulatory Visit | Attending: Thoracic Surgery (Cardiothoracic Vascular Surgery) | Admitting: Thoracic Surgery (Cardiothoracic Vascular Surgery)

## 2021-08-13 DIAGNOSIS — K8689 Other specified diseases of pancreas: Secondary | ICD-10-CM

## 2021-08-13 MED ORDER — IOPAMIDOL (ISOVUE-300) INJECTION 61%
100.0000 mL | Freq: Once | INTRAVENOUS | Status: AC | PRN
  Administered 2021-08-13: 09:00:00 100 mL via INTRAVENOUS

## 2021-08-14 DIAGNOSIS — E1169 Type 2 diabetes mellitus with other specified complication: Secondary | ICD-10-CM | POA: Diagnosis not present

## 2021-08-14 DIAGNOSIS — E7800 Pure hypercholesterolemia, unspecified: Secondary | ICD-10-CM | POA: Diagnosis not present

## 2021-08-14 DIAGNOSIS — N183 Chronic kidney disease, stage 3 unspecified: Secondary | ICD-10-CM | POA: Diagnosis not present

## 2021-08-14 DIAGNOSIS — M810 Age-related osteoporosis without current pathological fracture: Secondary | ICD-10-CM | POA: Diagnosis not present

## 2021-08-14 DIAGNOSIS — I10 Essential (primary) hypertension: Secondary | ICD-10-CM | POA: Diagnosis not present

## 2021-08-14 DIAGNOSIS — I5032 Chronic diastolic (congestive) heart failure: Secondary | ICD-10-CM | POA: Diagnosis not present

## 2021-08-14 DIAGNOSIS — I480 Paroxysmal atrial fibrillation: Secondary | ICD-10-CM | POA: Diagnosis not present

## 2021-08-14 DIAGNOSIS — I5030 Unspecified diastolic (congestive) heart failure: Secondary | ICD-10-CM | POA: Diagnosis not present

## 2021-08-16 ENCOUNTER — Encounter (HOSPITAL_COMMUNITY): Payer: Self-pay | Admitting: Nurse Practitioner

## 2021-08-16 ENCOUNTER — Ambulatory Visit (HOSPITAL_COMMUNITY)
Admission: RE | Admit: 2021-08-16 | Discharge: 2021-08-16 | Disposition: A | Discharge status: Home / Self Care | Payer: Medicare Other | Source: Ambulatory Visit | Attending: Nurse Practitioner | Admitting: Nurse Practitioner

## 2021-08-16 ENCOUNTER — Encounter: Payer: Self-pay | Admitting: Gastroenterology

## 2021-08-16 ENCOUNTER — Other Ambulatory Visit: Payer: Self-pay

## 2021-08-16 VITALS — BP 134/86 | HR 78 | Ht 59.0 in | Wt 206.0 lb

## 2021-08-16 DIAGNOSIS — I5032 Chronic diastolic (congestive) heart failure: Secondary | ICD-10-CM | POA: Diagnosis not present

## 2021-08-16 DIAGNOSIS — Z7901 Long term (current) use of anticoagulants: Secondary | ICD-10-CM | POA: Insufficient documentation

## 2021-08-16 DIAGNOSIS — Z6841 Body Mass Index (BMI) 40.0 and over, adult: Secondary | ICD-10-CM | POA: Diagnosis not present

## 2021-08-16 DIAGNOSIS — Z79899 Other long term (current) drug therapy: Secondary | ICD-10-CM | POA: Insufficient documentation

## 2021-08-16 DIAGNOSIS — I110 Hypertensive heart disease with heart failure: Secondary | ICD-10-CM | POA: Insufficient documentation

## 2021-08-16 DIAGNOSIS — Z9989 Dependence on other enabling machines and devices: Secondary | ICD-10-CM | POA: Insufficient documentation

## 2021-08-16 DIAGNOSIS — D6869 Other thrombophilia: Secondary | ICD-10-CM | POA: Diagnosis not present

## 2021-08-16 DIAGNOSIS — J849 Interstitial pulmonary disease, unspecified: Secondary | ICD-10-CM | POA: Diagnosis not present

## 2021-08-16 DIAGNOSIS — G4730 Sleep apnea, unspecified: Secondary | ICD-10-CM | POA: Diagnosis not present

## 2021-08-16 DIAGNOSIS — J8410 Pulmonary fibrosis, unspecified: Secondary | ICD-10-CM | POA: Diagnosis not present

## 2021-08-16 DIAGNOSIS — J439 Emphysema, unspecified: Secondary | ICD-10-CM | POA: Diagnosis not present

## 2021-08-16 DIAGNOSIS — Z9981 Dependence on supplemental oxygen: Secondary | ICD-10-CM | POA: Insufficient documentation

## 2021-08-16 DIAGNOSIS — I4819 Other persistent atrial fibrillation: Secondary | ICD-10-CM

## 2021-08-16 DIAGNOSIS — E669 Obesity, unspecified: Secondary | ICD-10-CM | POA: Diagnosis not present

## 2021-08-16 LAB — BASIC METABOLIC PANEL
Anion gap: 8 (ref 5–15)
BUN: 14 mg/dL (ref 8–23)
CO2: 28 mmol/L (ref 22–32)
Calcium: 9.6 mg/dL (ref 8.9–10.3)
Chloride: 104 mmol/L (ref 98–111)
Creatinine, Ser: 0.84 mg/dL (ref 0.44–1.00)
GFR, Estimated: >60 mL/min (ref >60)
Glucose, Bld: 112 mg/dL — ABNORMAL HIGH (ref 70–99)
Potassium: 3.6 mmol/L (ref 3.5–5.1)
Sodium: 140 mmol/L (ref 135–145)

## 2021-08-16 LAB — MAGNESIUM: Magnesium: 2.2 mg/dL (ref 1.7–2.4)

## 2021-08-16 NOTE — Progress Notes (Signed)
Date:  08/16/2021   ID:  [name], DOB 06/13/1951, MRN [id number]  Location:  In office  Provider location: [address] [place], [place] [address] Evaluation Performed:  Follow up   PCP:  Shirline Frees, MD  Primary Cardiologist:  [name] Primary Electrophysiologist: Dr. Rayann Heman   CC: afib/tikosyn surveillance   History of Present Illness: [name] is a 70 y.o. female who presents for a f/u tikosyn  visit today.   Pt reports that she is doing well with only 2 short afib episodes last month. otherwise staying in SR, and remains compliant with Tikosyn and  Eliquis. CHA2DS2VASc Score of 3. No bleeding issues. She is feeling more short of breath, h/o pulmonary fibrosis and she is wanting to move up her appointment with him,continues on O2 via nasal cannula.    F/u in afib clinic, 08/16/21 for Tikosyn surveillance. She  reports no afib. She is being compliant with Tikosyn and eliquis. Qt stable.   Today, she denies symptoms of palpitations, chest pain, orthopnea, PND, lower extremity edema, claudication, dizziness, presyncope, syncope, bleeding, or neurologic sequela.+shortness of breath. The patient is tolerating medications without difficulties and is otherwise without complaint today.   she denies symptoms of cough, fevers, chills, or new SOB worrisome for COVID 19.    Atrial Fibrillation Risk Factors:  she does have symptoms or diagnosis of sleep apnea. she is compliant with BiPAP therapy. she does not have a history of rheumatic fever. she does not have a history of alcohol use. The patient does not have a history of early familial atrial fibrillation or other arrhythmias.  she has a BMI of Body mass index is 41.61 kg/m.[name] Filed Weights   08/16/21 1118  Weight: 93.4 kg    Past Medical History:  Diagnosis Date   Chronic diastolic CHF (congestive heart failure) (HCC)    Chronic respiratory failure (HCC)    Coronary atherosclerosis    a. 2V by  CT 10/2017.   Emphysema of lung ([place])    Hypercholesterolemia    Hypertension    NSIP (nonspecific interstitial pneumonia) (HCC)    Osteoarthritis    Persistent atrial fibrillation ([name])    Thoracic aortic ectasia [facility])    Past Surgical History:  Procedure Laterality Date   CARDIOVERSION N/A 07/25/2016   Procedure: CARDIOVERSION;  Surgeon: Fay Records, MD;  Location: [place];  Service: Cardiovascular;  Laterality: N/A;   CARDIOVERSION N/A 09/14/2016   Procedure: CARDIOVERSION;  Surgeon: Thompson Grayer, MD;  Location: [facility] OR;  Service: Cardiovascular;  Laterality: N/A;   CATARACT EXTRACTION Bilateral    LEG SURGERY Right    TEE WITHOUT CARDIOVERSION N/A 07/25/2016   Procedure: TRANSESOPHAGEAL ECHOCARDIOGRAM (TEE);  Surgeon: Fay Records, MD;  Location: [facility] ENDOSCOPY;  Service: Cardiovascular;  Laterality: N/A;     Current Outpatient Medications  Medication Sig Dispense Refill   ACCU-CHEK GUIDE test strip See admin instructions.     acetaminophen (TYLENOL) 500 MG tablet Take 500 mg by mouth every 6 (six) hours as needed for moderate pain.     apixaban (ELIQUIS) 5 MG TABS tablet Take 1 tablet (5 mg total) by mouth 2 (two) times daily. 56 tablet 0   ascorbic acid (VITAMIN C) 500 MG tablet Take 500 mg by mouth daily. Takes one tablet every other day     atorvastatin (LIPITOR) 20 MG tablet SMARTSIG:1 Tablet(s) By Mouth Every Evening     brimonidine (ALPHAGAN) 0.2 % ophthalmic solution 1  drop 2 (two) times daily.     calcium citrate-vitamin D (CITRACAL+D) 315-200 MG-UNIT tablet Take 1 tablet by mouth daily.     cephALEXin (KEFLEX) 500 MG capsule Take 1 capsule (500 mg total) by mouth 3 (three) times daily. 21 capsule 0   cholecalciferol (VITAMIN D3) 25 MCG (1000 UNIT) tablet Take 1,000 Units by mouth daily. Takes one tablet every other day     diltiazem (CARDIZEM CD) 240 MG 24 hr capsule TAKE ONE CAPSULE BY MOUTH ONCE DAILY 90 capsule 3   dofetilide (TIKOSYN) 500 MCG capsule TAKE ONE  CAPSULE BY MOUTH TWICE DAILY 60 capsule 11   furosemide (LASIX) 40 MG tablet Take 1 tablet (40 mg total) by mouth daily. MUST [place] FOLLOW UP APPT FOR FURTHER REFILLS 1ST ATTEMPT (Patient taking differently: Take 40 mg by mouth daily.) 30 tablet 0   GINKGO BILOBA COMPLEX PO Take by mouth. Takes one tablet every other day     HYDROcodone-acetaminophen (NORCO/VICODIN) 5-325 MG tablet Take 1 tablet by mouth every 6 (six) hours as needed for moderate pain.     irbesartan (AVAPRO) 300 MG tablet Take 300 mg by mouth daily.     LORazepam (ATIVAN) 1 MG tablet Take 1 mg by mouth as needed.  1   Multiple Vitamin (MULTIVITAMIN) capsule Take 1 capsule by mouth daily.     Multiple Vitamins-Minerals (HAIR SKIN NAILS PO) Take by mouth. Takes one tablet every other day     OXYGEN 2lpm 24/7 and 4 lpm with exertion A[facility]     ROCKLATAN 0.02-0.005 % SOLN Apply 1 drop to eye at bedtime.     UNABLE TO FIND BIPAP with 2lpm o2 at night  A[facility]     vitamin B-12 (CYANOCOBALAMIN) 1000 MCG tablet Take 1,000 mcg by mouth every other day.     No current facility-administered medications for this encounter.    Allergies:   Oxycodone hcl, Levaquin [levofloxacin in d5w], and Percocet [oxycodone-acetaminophen]   Social History:  The patient  reports that she quit smoking about 10 years ago. Her smoking use included cigarettes. She started smoking about 55 years ago. She has a 43.00 pack-year smoking history. She has never used smokeless tobacco. She reports that she does not drink alcohol and does not use drugs.   Family History:  The patient's  family history includes Heart attack in her sister; Heart disease in her father.    ROS:  Please see the history of present illness.   All other systems are personally reviewed and negative.   Exam: GEN- The patient is well appearing, alert and oriented x 3 today.   Head- normocephalic, atraumatic Eyes-  Sclera clear, conjunctiva pink Ears- hearing intact Oropharynx- clear Neck-  supple, no JVP Lymph- no cervical lymphadenopathy Lungs- Clear to ausculation bilaterally, normal work of breathing Heart- Regular rate and rhythm, no murmurs, rubs or gallops, PMI not laterally displaced GI- soft, NT, ND, + BS Extremities- no clubbing, cyanosis, or 1t pitting edema( improved) MS- no significant deformity or atrophy Skin- no rash or lesion Psych- euthymic mood, full affect Neuro- strength and sensation are intact       Recent Labs: 02/15/2021: BUN 20; Creatinine, Ser 0.89; Magnesium 2.1; Potassium 4.8; Sodium 142  personally reviewed    Other studies personally reviewed: Ekg- Normal sinus rhythm Normal ECG Vent. rate 62 BPM PR interval 152 ms QRS duration 84 ms QT/QTc 416/422 ms      ASSESSMENT AND PLAN:  1.  H/o persisitent atrial fibrillation Doing  well maintaining SR on Tikosyn 500 mcg bid Continue Cardizem 240 mg a day Continue  eliquis 5 mg bid This patients CHA2DS2-VASc Score and unadjusted Ischemic Stroke Rate (% per year) is equal to 3.2 % stroke rate/year from a score of 3  Above score calculated as 1 point each if present [CHF, HTN, DM, Vascular=MI/PAD/Aortic Plaque, Age if 65-74, or Female] Above score calculated as 2 points each if present [Age > 75, or Stroke/TIA/TE]  Bmet/mag/  today  Qtc stable  Current medicines are reviewed at length with the patient today.   The patient does not have concerns regarding her medicines.  The following changes were made today:  none  Labs/ tests ordered today include: none Orders Placed This Encounter  Procedures   Basic metabolic panel   Magnesium   EKG 12-Lead   2. Shortness of breath  H/o interstitial lung disease  Wearing O2 via nasal cannula   Patient Risk:  after full review of this patients clinical status, I feel that they are at  Mod risk at this time.  F/u in 6 months   Signed, [name] NP 08/16/2021 12:09 PM  [facility] [address] [place], [place] [address] [phone]

## 2021-08-17 ENCOUNTER — Other Ambulatory Visit (HOSPITAL_COMMUNITY): Payer: Self-pay | Admitting: *Deleted

## 2021-08-17 MED ORDER — POTASSIUM CHLORIDE CRYS ER 20 MEQ PO TBCR: 20.0000 meq | EXTENDED_RELEASE_TABLET | Freq: Every day | ORAL | 3 refills | Status: DC

## 2021-08-17 NOTE — Addendum Note (Signed)
Addended by: Shona Simpson on: 08/17/2021 12:41 PM   Modules accepted: Orders

## 2021-08-21 MED ORDER — POTASSIUM CHLORIDE CRYS ER 20 MEQ PO TBCR: 20.0000 meq | EXTENDED_RELEASE_TABLET | Freq: Every day | ORAL | 3 refills | Status: DC

## 2021-08-21 NOTE — Addendum Note (Signed)
Addended by: Shona Simpson on: 08/21/2021 08:45 AM   Modules accepted: Orders

## 2021-08-22 DIAGNOSIS — J449 Chronic obstructive pulmonary disease, unspecified: Secondary | ICD-10-CM | POA: Diagnosis not present

## 2021-08-22 DIAGNOSIS — G4733 Obstructive sleep apnea (adult) (pediatric): Secondary | ICD-10-CM | POA: Diagnosis not present

## 2021-08-22 DIAGNOSIS — J9610 Chronic respiratory failure, unspecified whether with hypoxia or hypercapnia: Secondary | ICD-10-CM | POA: Diagnosis not present

## 2021-08-24 ENCOUNTER — Other Ambulatory Visit (HOSPITAL_COMMUNITY): Payer: Self-pay

## 2021-08-24 MED ORDER — APIXABAN 5 MG PO TABS: 5.0000 mg | ORAL_TABLET | Freq: Two times a day (BID) | ORAL | 0 refills | Status: DC

## 2021-08-31 ENCOUNTER — Other Ambulatory Visit: Payer: Self-pay

## 2021-08-31 ENCOUNTER — Ambulatory Visit (HOSPITAL_COMMUNITY)
Admission: RE | Admit: 2021-08-31 | Discharge: 2021-08-31 | Disposition: A | Discharge status: Home / Self Care | Payer: Medicare Other | Source: Ambulatory Visit | Attending: Physician Assistant | Admitting: Physician Assistant

## 2021-08-31 DIAGNOSIS — I4819 Other persistent atrial fibrillation: Secondary | ICD-10-CM | POA: Insufficient documentation

## 2021-08-31 DIAGNOSIS — J449 Chronic obstructive pulmonary disease, unspecified: Secondary | ICD-10-CM | POA: Diagnosis not present

## 2021-08-31 DIAGNOSIS — R069 Unspecified abnormalities of breathing: Secondary | ICD-10-CM | POA: Diagnosis not present

## 2021-08-31 DIAGNOSIS — J9601 Acute respiratory failure with hypoxia: Secondary | ICD-10-CM | POA: Diagnosis not present

## 2021-08-31 LAB — BASIC METABOLIC PANEL
Anion gap: 8 (ref 5–15)
BUN: 17 mg/dL (ref 8–23)
CO2: 29 mmol/L (ref 22–32)
Calcium: 9.9 mg/dL (ref 8.9–10.3)
Chloride: 103 mmol/L (ref 98–111)
Creatinine, Ser: 0.93 mg/dL (ref 0.44–1.00)
GFR, Estimated: >60 mL/min (ref >60)
Glucose, Bld: 102 mg/dL — ABNORMAL HIGH (ref 70–99)
Potassium: 4.2 mmol/L (ref 3.5–5.1)
Sodium: 140 mmol/L (ref 135–145)

## 2021-09-05 DIAGNOSIS — I480 Paroxysmal atrial fibrillation: Secondary | ICD-10-CM | POA: Diagnosis not present

## 2021-09-05 DIAGNOSIS — G4733 Obstructive sleep apnea (adult) (pediatric): Secondary | ICD-10-CM | POA: Diagnosis not present

## 2021-09-05 DIAGNOSIS — I5032 Chronic diastolic (congestive) heart failure: Secondary | ICD-10-CM | POA: Diagnosis not present

## 2021-09-05 DIAGNOSIS — I10 Essential (primary) hypertension: Secondary | ICD-10-CM | POA: Diagnosis not present

## 2021-09-05 DIAGNOSIS — J8410 Pulmonary fibrosis, unspecified: Secondary | ICD-10-CM | POA: Diagnosis not present

## 2021-09-07 DIAGNOSIS — Z1231 Encounter for screening mammogram for malignant neoplasm of breast: Secondary | ICD-10-CM | POA: Diagnosis not present

## 2021-09-12 ENCOUNTER — Ambulatory Visit (INDEPENDENT_AMBULATORY_CARE_PROVIDER_SITE_OTHER): Payer: Medicare Other | Admitting: Gastroenterology

## 2021-09-12 ENCOUNTER — Encounter: Payer: Self-pay | Admitting: Gastroenterology

## 2021-09-12 ENCOUNTER — Other Ambulatory Visit (INDEPENDENT_AMBULATORY_CARE_PROVIDER_SITE_OTHER): Payer: Medicare Other

## 2021-09-12 VITALS — BP 136/82 | HR 77 | Ht 60.0 in | Wt 205.5 lb

## 2021-09-12 DIAGNOSIS — Z9981 Dependence on supplemental oxygen: Secondary | ICD-10-CM | POA: Diagnosis not present

## 2021-09-12 DIAGNOSIS — K862 Cyst of pancreas: Secondary | ICD-10-CM

## 2021-09-12 DIAGNOSIS — Z7901 Long term (current) use of anticoagulants: Secondary | ICD-10-CM

## 2021-09-12 LAB — HEPATIC FUNCTION PANEL
ALT: 15 U/L (ref 0–35)
AST: 18 U/L (ref 0–37)
Albumin: 4.4 g/dL (ref 3.5–5.2)
Alkaline Phosphatase: 93 U/L (ref 39–117)
Bilirubin, Direct: 0.2 mg/dL (ref 0.0–0.3)
Total Bilirubin: 0.5 mg/dL (ref 0.2–1.2)
Total Protein: 7.9 g/dL (ref 6.0–8.3)

## 2021-09-12 LAB — LIPASE: Lipase: 63 U/L — ABNORMAL HIGH (ref 11.0–59.0)

## 2021-09-12 NOTE — Patient Instructions (Addendum)
If you are age 72 or older, your body mass index should be between 23-30. Your Body mass index is 40.13 kg/m. If this is out of the aforementioned range listed, please consider follow up with your Primary Care Provider.  If you are age 13 or younger, your body mass index should be between 19-25. Your Body mass index is 40.13 kg/m. If this is out of the aformentioned range listed, please consider follow up with your Primary Care Provider.   You have been scheduled for an MRI at [facility] . Your appointment time is 9am . Please arrive to admitting (at main entrance of the hospital) 15 minutes prior to your appointment time for registration purposes. Please make certain not to have anything to eat or drink 6 hours prior to your test. In addition, if you have any metal in your body, have a pacemaker or defibrillator, please be sure to let your ordering physician know. This test typically takes 45 minutes to 1 hour to complete. Should you need to reschedule, please call [phone] to do so.   Your provider has requested that you go to the basement level for lab work before leaving today. Press "B" on the elevator. The lab is located at the first door on the left as you exit the elevator.  We will contact you to schedule EUS.  The [place] [facility] providers would like to encourage you to use [facility] to communicate with providers for non-urgent requests or questions.  Due to long hold times on the telephone, sending your provider a message by [facility] may be a faster and more efficient way to get a response.  Please allow 48 business hours for a response.  Please remember that this is for non-urgent requests.   It was a pleasure to see you today!  Thank you for trusting me with your gastrointestinal care!    Scott E.Tomasa Rand, MD

## 2021-09-12 NOTE — Progress Notes (Signed)
HPI : [name] is a very pleasant 71 year old female with a history of atrial fibrillation on Eliquis and Tikosyn as well as oxygen dependent emphysema who was referred to [place] by Dr. [name] for further evaluation of incidental pancreatic cyst.  The patient has a thoracic aortic aneurysm which has been monitored serially with CT scans, and was noted to have an incidental pancreatic cyst.  A dedicated CT abdomen pelvis was performed December 19 and showed a lobulated lesion in the superior pancreatic head measuring 3 x 1.7 cm with some features of internal septation.  This is increased in size compared to a previous CT when it measured 2 x 1.2 cm according to the radiology report.  There is no ductal dilation or inflammatory changes. The patient denies any history of pancreatitis.  No family history of pancreatitis or pancreatic malignancy.  She is a nondrinker.  She is a former smoker, quitting in 2012 after a 40-pack-year history. She denies any chronic GI symptoms.  Specifically, she denies problems with abdominal pain, constipation, diarrhea or blood in her stool.  No problems with nausea, vomiting or dyspepsia.  No heartburn or acid reflux.  She has regular bowel movements usually every 1 to 2 days.  She states her weight fluctuates by a few pounds each day, but is overall stable, denies sustained weight loss. She has been on home O2 since 2019.  She is on 2 L at rest, and increases to 3 to 4 L as needed with activity.  She wears BiPAP at night.  She typically walks 1 to 1-1/2 miles per day as part of her daily activities.  She does not walk for exercise.  She denies symptoms of chest pain or shortness of breath or palpitations.  She was recently seen by her cardiologist and is recommended to continue on her current medications of Tikosyn and Eliquis, with no further evaluation recommended.  She has a follow-up with her pulmonologist next month. The most recent echocardiogram that I can see  was from 2018 with an EF of 50-55%, LVH   Past Medical History:  Diagnosis Date   Chronic diastolic CHF (congestive heart failure) (HCC)    Chronic respiratory failure (HCC)    Coronary atherosclerosis    a. 2V by CT 10/2017.   Emphysema of lung (HCC)    Hypercholesterolemia    Hypertension    NSIP (nonspecific interstitial pneumonia) (HCC)    Osteoarthritis    Persistent atrial fibrillation (HCC)    Thoracic aortic ectasia [facility])      Past Surgical History:  Procedure Laterality Date   CARDIOVERSION N/A 07/25/2016   Procedure: CARDIOVERSION;  Surgeon: Pricilla Riffle, MD;  Location: [facility] ENDOSCOPY;  Service: Cardiovascular;  Laterality: N/A;   CARDIOVERSION N/A 09/14/2016   Procedure: CARDIOVERSION;  Surgeon: Hillis Range, MD;  Location: [facility] OR;  Service: Cardiovascular;  Laterality: N/A;   CATARACT EXTRACTION Bilateral    LEG SURGERY Right    TEE WITHOUT CARDIOVERSION N/A 07/25/2016   Procedure: TRANSESOPHAGEAL ECHOCARDIOGRAM (TEE);  Surgeon: Pricilla Riffle, MD;  Location: [facility] ENDOSCOPY;  Service: Cardiovascular;  Laterality: N/A;   Family History  Problem Relation Age of Onset   Heart disease Father    Heart attack Sister    Stroke Neg Hx    Social History   Tobacco Use   Smoking status: Former    Packs/day: 1.00    Years: 43.00    Pack years: 43.00    Types: Cigarettes    Start  date: 08/26/1966    Quit date: 08/26/2010    Years since quitting: 11.0   Smokeless tobacco: Never  Vaping Use   Vaping Use: Never used  Substance Use Topics   Alcohol use: No    Alcohol/week: 0.0 standard drinks   Drug use: No   Current Outpatient Medications  Medication Sig Dispense Refill   ACCU-CHEK GUIDE test strip See admin instructions.     acetaminophen (TYLENOL) 500 MG tablet Take 500 mg by mouth every 6 (six) hours as needed for moderate pain.     apixaban (ELIQUIS) 5 MG TABS tablet Take 1 tablet (5 mg total) by mouth 2 (two) times daily. 84 tablet 0   ascorbic acid (VITAMIN C) 500 MG  tablet Take 500 mg by mouth daily. Takes one tablet every other day     atorvastatin (LIPITOR) 20 MG tablet SMARTSIG:1 Tablet(s) By Mouth Every Evening     brimonidine (ALPHAGAN) 0.2 % ophthalmic solution 1 drop 2 (two) times daily.     calcium citrate-vitamin D (CITRACAL+D) 315-200 MG-UNIT tablet Take 1 tablet by mouth daily.     cephALEXin (KEFLEX) 500 MG capsule Take 1 capsule (500 mg total) by mouth 3 (three) times daily. 21 capsule 0   cholecalciferol (VITAMIN D3) 25 MCG (1000 UNIT) tablet Take 1,000 Units by mouth daily. Takes one tablet every other day     diltiazem (CARDIZEM CD) 240 MG 24 hr capsule TAKE ONE CAPSULE BY MOUTH ONCE DAILY 90 capsule 3   dofetilide (TIKOSYN) 500 MCG capsule TAKE ONE CAPSULE BY MOUTH TWICE DAILY 60 capsule 11   furosemide (LASIX) 40 MG tablet Take 1 tablet (40 mg total) by mouth daily. MUST SCH FOLLOW UP APPT FOR FURTHER REFILLS 1ST ATTEMPT (Patient taking differently: Take 40 mg by mouth daily.) 30 tablet 0   GINKGO BILOBA COMPLEX PO Take by mouth. Takes one tablet every other day     HYDROcodone-acetaminophen (NORCO/VICODIN) 5-325 MG tablet Take 1 tablet by mouth every 6 (six) hours as needed for moderate pain.     irbesartan (AVAPRO) 300 MG tablet Take 300 mg by mouth daily.     LORazepam (ATIVAN) 1 MG tablet Take 1 mg by mouth as needed.  1   Multiple Vitamin (MULTIVITAMIN) capsule Take 1 capsule by mouth daily.     Multiple Vitamins-Minerals (HAIR SKIN NAILS PO) Take by mouth. Takes one tablet every other day     OXYGEN 2lpm 24/7 and 4 lpm with exertion AHC     potassium chloride SA (KLOR-CON M) 20 MEQ tablet Take 1 tablet (20 mEq total) by mouth daily. 30 tablet 3   ROCKLATAN 0.02-0.005 % SOLN Apply 1 drop to eye at bedtime.     UNABLE TO FIND BIPAP with 2lpm o2 at night  AHC     vitamin B-12 (CYANOCOBALAMIN) 1000 MCG tablet Take 1,000 mcg by mouth every other day.     No current facility-administered medications for this visit.   Allergies   Allergen Reactions   Oxycodone Hcl Itching   Levaquin [Levofloxacin In D5w] Itching and Rash   Percocet [Oxycodone-Acetaminophen] Itching     Review of Systems: All systems reviewed and negative except where noted in HPI.    CT ABDOMEN PELVIS W WO CONTRAST  Result Date: 08/13/2021 CLINICAL DATA:  Incidental lesion of the pancreas identified by CT of the chest EXAM: CT ABDOMEN AND PELVIS WITHOUT AND WITH CONTRAST TECHNIQUE: Multidetector CT imaging of the abdomen and pelvis was performed following the standard protocol  before and following the bolus administration of intravenous contrast. CONTRAST:  ISOVUE-300 IOPAMIDOL (ISOVUE-300) INJECTION 61% COMPARISON:  CT chest angiogram, 07/17/2021 CT abdomen pelvis, 07/14/2019 FINDINGS: Lower chest: No acute abnormality.  Coronary artery calcifications. Hepatobiliary: No solid liver abnormality is seen. No gallstones, gallbladder wall thickening, or biliary dilatation. Pancreas: There is a lobulated lesion in the superior pancreatic head measuring 3.0 x 1.7 cm, generally of fluid attenuation with suggestion of some enhancing internal septation (series 11, image 80). This is clearly enlarged in comparison to prior examination of the abdomen and pelvis, at which time it measured no larger than 2.0 x 1.2 cm. No pancreatic ductal dilatation or surrounding inflammatory changes. Spleen: Normal in size without significant abnormality. Adrenals/Urinary Tract: Adrenal glands are unremarkable. Multiple bilateral benign renal cysts. Kidneys are otherwise normal, without renal calculi, solid lesion, or hydronephrosis. Bladder is unremarkable. Stomach/Bowel: Stomach is within normal limits. Appendix appears normal. No evidence of bowel wall thickening, distention, or inflammatory changes. Descending and sigmoid diverticulosis. Vascular/Lymphatic: Aortic atherosclerosis. 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 No enlarged abdominal or pelvic lymph nodes. Reproductive: No mass or other significant abnormality. Other: No abdominal wall hernia or abnormality. No abdominopelvic ascites. Musculoskeletal: No acute or significant osseous findings. IMPRESSION: 1. There is a lobulated lesion in the superior pancreatic head measuring 3.0 x 1.7 cm, generally of fluid attenuation with suggestion of some enhancing internal septation. This is clearly enlarged in comparison to prior examination of the abdomen and pelvis dated 07/14/2019. Given location, patient demographic, an interval growth, this may reflect a serous cystic neoplasm, differential considerations including an enlarging IPMN or pseudocyst. Given size and clear evidence of growth, recommend multiphasic contrast MRI to further characterize with consideration of EUS/FNA for tissue diagnosis. 2. 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 Recommend annual imaging followup by CTA or MRA. This recommendation follows 2010 ACCF/AHA/AATS/ACR/ASA/SCA/SCAI/SIR/STS/SVM Guidelines for the Diagnosis and Management of Patients with Thoracic Aortic Disease. Circulation. 2010; 121: Z610-R604. Aortic aneurysm NOS (ICD10-I71.9) 3. Coronary artery disease. These results will be called to the ordering clinician or representative by the Radiologist Assistant, and communication documented in the PACS or [employer]. Aortic Atherosclerosis (ICD10-I70.0). Electronically Signed   By: Jearld Lesch M.D.   On: 08/13/2021 10:11    Physical Exam: BP 136/82    Pulse 77    Ht 5' (1.524 m)    Wt 205 lb 8 oz (93.2 kg)    BMI 40.13 kg/m  Constitutional: Pleasant,well-developed, Caucasian female in no acute distress.  Accompanied by  sister HEENT: Normocephalic and atraumatic. Conjunctivae are normal. No scleral icterus.  Mallampati 2 Cardiovascular: Normal rate, regular rhythm.  Pulmonary/chest: Effort normal and breath sounds normal, does appear short of breath when removing oxygen and climbing on exam table. No wheezing, rales or rhonchi. Abdominal: Soft, nondistended, nontender. Bowel sounds active throughout. There are no masses palpable. No hepatomegaly.  Ventral hernia present Extremities: Trace pitting pretibial edema bilaterally Neurological: Alert and oriented to person place and time. Skin: Skin is warm and dry. No rashes noted. Psychiatric: Normal mood and affect. Behavior is normal.  CBC    Component Value Date/Time   WBC 9.6 03/14/2020 1530   RBC 5.06 03/14/2020 1530  HGB 15.5 (H) 03/14/2020 1530   HGB 14.4 07/30/2018 1115   HCT 46.7 (H) 03/14/2020 1530   HCT 44.7 07/30/2018 1115   PLT 188 03/14/2020 1530   PLT 198 07/30/2018 1115   MCV 92.3 03/14/2020 1530   MCV 89 07/30/2018 1115   MCH 30.6 03/14/2020 1530   MCHC 33.2 03/14/2020 1530   RDW 14.0 03/14/2020 1530   RDW 13.9 07/30/2018 1115   LYMPHSABS 1.3 10/29/2016 1205   MONOABS 1.1 (H) 10/29/2016 1205   EOSABS 0.2 10/29/2016 1205   BASOSABS 0.1 10/29/2016 1205    CMP     Component Value Date/Time   NA 140 08/31/2021 1030   NA 139 10/21/2018 1003   K 4.2 08/31/2021 1030   CL 103 08/31/2021 1030   CO2 29 08/31/2021 1030   GLUCOSE 102 (H) 08/31/2021 1030   BUN 17 08/31/2021 1030   BUN 16 10/21/2018 1003   CREATININE 0.93 08/31/2021 1030   CALCIUM 9.9 08/31/2021 1030   PROT 6.6 07/23/2016 1416   ALBUMIN 3.8 07/23/2016 1416   AST 25 07/23/2016 1416   ALT 19 07/23/2016 1416   ALKPHOS 72 07/23/2016 1416   BILITOT 0.7 07/23/2016 1416   GFRNONAA >60 08/31/2021 1030   GFRAA >60 03/14/2020 1530     ASSESSMENT AND PLAN: 71 year old female with atrial fibrillation and oxygen dependent emphysema, found to have 3 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  No history of pancreatitis, no family history of pancreatic cancer or pancreatitis.  High suspicion for IPMN versus mucinous neoplasm.  Given size of 3 cm and interval change in size, EUS with FNA is indicated.  We will get a MRCP first.  We will get baseline liver enzymes, lipase and CA 19-9 I will ask our endosonographers, Drs. Gerilyn Pilgrim and Mansouraty to review her case to ensure that they agree with proceeding with an EUS.  She will need to hold her Eliquis prior to the procedure.  Pancreatic cyst -MRCP - Hepatic panel, lipase, CA 19-9 -Potential EUS w/ FNA with either Dr. Christella Hartigan or Dr.Mansouraty  Lorin Picket E. Tomasa Rand, MD [facility]  CC:  [name], MD

## 2021-09-13 LAB — CANCER ANTIGEN 19-9: CA 19-9: 8 U/mL (ref <34)

## 2021-09-14 DIAGNOSIS — I712 Thoracic aortic aneurysm, without rupture, unspecified: Secondary | ICD-10-CM | POA: Diagnosis not present

## 2021-09-14 DIAGNOSIS — G894 Chronic pain syndrome: Secondary | ICD-10-CM | POA: Diagnosis not present

## 2021-09-14 DIAGNOSIS — E7800 Pure hypercholesterolemia, unspecified: Secondary | ICD-10-CM | POA: Diagnosis not present

## 2021-09-14 DIAGNOSIS — J8410 Pulmonary fibrosis, unspecified: Secondary | ICD-10-CM | POA: Diagnosis not present

## 2021-09-14 DIAGNOSIS — M810 Age-related osteoporosis without current pathological fracture: Secondary | ICD-10-CM | POA: Diagnosis not present

## 2021-09-14 DIAGNOSIS — I5032 Chronic diastolic (congestive) heart failure: Secondary | ICD-10-CM | POA: Diagnosis not present

## 2021-09-14 DIAGNOSIS — E1169 Type 2 diabetes mellitus with other specified complication: Secondary | ICD-10-CM | POA: Diagnosis not present

## 2021-09-14 DIAGNOSIS — I2510 Atherosclerotic heart disease of native coronary artery without angina pectoris: Secondary | ICD-10-CM | POA: Diagnosis not present

## 2021-09-14 DIAGNOSIS — G4733 Obstructive sleep apnea (adult) (pediatric): Secondary | ICD-10-CM | POA: Diagnosis not present

## 2021-09-14 DIAGNOSIS — K862 Cyst of pancreas: Secondary | ICD-10-CM | POA: Diagnosis not present

## 2021-09-14 DIAGNOSIS — N183 Chronic kidney disease, stage 3 unspecified: Secondary | ICD-10-CM | POA: Diagnosis not present

## 2021-09-14 DIAGNOSIS — Z8639 Personal history of other endocrine, nutritional and metabolic disease: Secondary | ICD-10-CM | POA: Diagnosis not present

## 2021-09-14 DIAGNOSIS — I480 Paroxysmal atrial fibrillation: Secondary | ICD-10-CM | POA: Diagnosis not present

## 2021-09-14 DIAGNOSIS — I10 Essential (primary) hypertension: Secondary | ICD-10-CM | POA: Diagnosis not present

## 2021-09-16 NOTE — Progress Notes (Signed)
[name],  Your labs looked good.  Liver enzymes were normal with no suggestion of bile duct obstruction.  Your pancreas enzymes were very slightly elevated and the cancer marker was normal.  Will await results of MRI before making any further recommendations.

## 2021-09-19 ENCOUNTER — Other Ambulatory Visit: Payer: Self-pay | Admitting: Gastroenterology

## 2021-09-19 ENCOUNTER — Ambulatory Visit (HOSPITAL_COMMUNITY)
Admission: RE | Admit: 2021-09-19 | Discharge: 2021-09-19 | Disposition: A | Discharge status: Home / Self Care | Payer: Medicare Other | Source: Ambulatory Visit | Attending: Gastroenterology | Admitting: Gastroenterology

## 2021-09-19 ENCOUNTER — Other Ambulatory Visit: Payer: Self-pay

## 2021-09-19 DIAGNOSIS — K862 Cyst of pancreas: Secondary | ICD-10-CM | POA: Diagnosis not present

## 2021-09-19 DIAGNOSIS — N281 Cyst of kidney, acquired: Secondary | ICD-10-CM | POA: Diagnosis not present

## 2021-09-19 DIAGNOSIS — K8689 Other specified diseases of pancreas: Secondary | ICD-10-CM | POA: Diagnosis not present

## 2021-09-19 DIAGNOSIS — I700 Atherosclerosis of aorta: Secondary | ICD-10-CM | POA: Diagnosis not present

## 2021-09-19 MED ORDER — GADOBUTROL 1 MMOL/ML IV SOLN
9.0000 mL | Freq: Once | INTRAVENOUS | Status: AC | PRN
  Administered 2021-09-19: 10:00:00 9 mL via INTRAVENOUS

## 2021-09-24 DIAGNOSIS — J449 Chronic obstructive pulmonary disease, unspecified: Secondary | ICD-10-CM | POA: Diagnosis not present

## 2021-09-24 DIAGNOSIS — J9601 Acute respiratory failure with hypoxia: Secondary | ICD-10-CM | POA: Diagnosis not present

## 2021-09-24 DIAGNOSIS — G4733 Obstructive sleep apnea (adult) (pediatric): Secondary | ICD-10-CM | POA: Diagnosis not present

## 2021-09-24 DIAGNOSIS — R069 Unspecified abnormalities of breathing: Secondary | ICD-10-CM | POA: Diagnosis not present

## 2021-09-24 DIAGNOSIS — J9610 Chronic respiratory failure, unspecified whether with hypoxia or hypercapnia: Secondary | ICD-10-CM | POA: Diagnosis not present

## 2021-09-26 ENCOUNTER — Ambulatory Visit: Payer: Medicare Other | Admitting: Internal Medicine

## 2021-09-26 DIAGNOSIS — R922 Inconclusive mammogram: Secondary | ICD-10-CM | POA: Diagnosis not present

## 2021-09-26 DIAGNOSIS — R928 Other abnormal and inconclusive findings on diagnostic imaging of breast: Secondary | ICD-10-CM | POA: Diagnosis not present

## 2021-09-26 DIAGNOSIS — N6489 Other specified disorders of breast: Secondary | ICD-10-CM | POA: Diagnosis not present

## 2021-09-27 NOTE — Progress Notes (Signed)
Ms. Ludlam,  The MRI confirmed the presence of a large cyst in your pancreas.  There were no features that were suspicious for cancer.  However, given the size of the cyst, it is recommended that it be further examined with endoscopic ultrasound (EUS, the procedure we discussed at your clinic visit) and that the fluid be sampled.  One of our nurses will reach out to you to get you scheduled for the endoscopic ultrasound, which will be done in the hospital (as an outpatient).  You will need to hold you Eliquis for 2 days prior to the procedure.  The procedure will be performed by either Dr. Rush Landmark or Dr. Ardis Hughs.  Dr. Rush Landmark recommends that another blood test be obtained prior to the procedure.  It is a tumor marker associated with pancreatic cancer (CA19-9).  The order has been placed and can be drawn at our lab at your convenience

## 2021-09-27 NOTE — Addendum Note (Signed)
Addended by: Jenel Lucks on: 09/27/2021 05:24 PM   Modules accepted: Orders

## 2021-10-01 DIAGNOSIS — J9601 Acute respiratory failure with hypoxia: Secondary | ICD-10-CM | POA: Diagnosis not present

## 2021-10-01 DIAGNOSIS — R069 Unspecified abnormalities of breathing: Secondary | ICD-10-CM | POA: Diagnosis not present

## 2021-10-01 DIAGNOSIS — J449 Chronic obstructive pulmonary disease, unspecified: Secondary | ICD-10-CM | POA: Diagnosis not present

## 2021-10-02 ENCOUNTER — Ambulatory Visit: Payer: Medicare Other | Admitting: Internal Medicine

## 2021-10-02 ENCOUNTER — Encounter: Payer: Self-pay | Admitting: Internal Medicine

## 2021-10-02 ENCOUNTER — Other Ambulatory Visit: Payer: Self-pay

## 2021-10-02 DIAGNOSIS — J9611 Chronic respiratory failure with hypoxia: Secondary | ICD-10-CM

## 2021-10-02 DIAGNOSIS — J209 Acute bronchitis, unspecified: Secondary | ICD-10-CM

## 2021-10-02 DIAGNOSIS — J8410 Pulmonary fibrosis, unspecified: Secondary | ICD-10-CM

## 2021-10-02 DIAGNOSIS — J9612 Chronic respiratory failure with hypercapnia: Secondary | ICD-10-CM | POA: Diagnosis not present

## 2021-10-02 DIAGNOSIS — J410 Simple chronic bronchitis: Secondary | ICD-10-CM

## 2021-10-02 MED ORDER — CEFDINIR 300 MG PO CAPS: 300.0000 mg | ORAL_CAPSULE | Freq: Two times a day (BID) | ORAL | 0 refills | Status: DC

## 2021-10-02 NOTE — Progress Notes (Signed)
Subjective:     Patient ID: AMEN MIKULA, female   DOB: [birth year]    MRN: [id number]    Brief patient profile:  52   yowf quit smoking 2012 due to some cough/wheezing improved p quit at wt 180 and no obst on spirometry 06/25/16  referred to pulmonary clinic 06/25/2016 by Dr   Shirline Frees s/p admit:   Admit date: 02/05/2016 Discharge date: 02/08/2016    Discharge Diagnoses:  Principal Problem:   Acute respiratory failure with hypoxia ([place]) Active Problems:   HTN (hypertension)   PNA (pneumonia)   Glaucoma       History of Present Illness  06/25/2016 [facility]st [facility] office visit/ Daryll Spisak   ? NSIP  Chief Complaint  Patient presents with   Consult visit    COPD consult per [name]- Pt states SOB w/ excertion. No congestion or tightness. Occasional coughing at times.   onset of sob spring 2017 x one month prior to admit on 02/05/16 gradually worse while on lisinopril  In April 2017 was using [facility] parking but still able to do [employer]  Now sob across the room  Sob occurs at rest if speaking  Able to lie down rotated on to left side otherwise chokes/ smothers rec Stop lotensin and start ibesartan 150 mg one daily  And your breathing should gradually improve  Please schedule a follow up office visit in 2 weeks, sooner if needed to see [name] to recheck your blood pressure  Add double the lasix dose for now    10/29/2016 acute extended ov/Chere Babson re: new onset resp failure/ pt maint on eliquis but no rsp rx  Chief Complaint  Patient presents with   Acute Visit    Pt c/o increased SOB and lower o2 sats for the past wk.   gradually worse sob x one week assoc with low 02 sats though baseline doe = MMRC3 = can't walk 100 yards even at a slow pace at a flat grade s stopping due to sob   No sign cough/ no chills no cp or vomiting  Leg swelling no worse than usual  rec Please see patient coordinator before you leave today  to schedule start 02 24/7 at 2lpm and ambulatory 02  titration to see if eligible for POC  Wear 02 2lpm 24/7 for now at 2lpm and increase to 4lpm with exertion Use your incentive spirometer as much as you can  Please remember to go to the lab and x-ray department downstairs in the basement  for your tests - we will call you with the results when they are available. I will arrange follow up for you here if indicated, otherwise just return to cardiology as planned  Late add:  Needs hrct next p taking one extra lasix daily x one week > NSIP    11/18/2017  f/u ov/Sage Hammill re:  ?NSIP last bird exp 2-3 years  02 2lpm bipap and 2lpm with activity  Chief Complaint  Patient presents with   Follow-up    PFT's done today. Breathing is unchanged.   Dyspnea:  Still shopping at [employer]/ wearing 2lpm with activity  Cough: none Sleep: sleep ok on cpap/ 02 SABA use:  n/a Roderic Palau  Now following for Cards rec Avoid bird exposure  I will let Roderic Palau know about your Aortic enlargement but the main treatment is to lose weight and keep your blood pressure down      03/26/2021  f/u ov/Jere Vanburen re: NSIP /  afib/02 24/7  @ 2lpm  Chief Complaint  Patient presents with   Follow-up    Over the past month has noticed increased DOE.    Dyspnea:  able to do [employer] slow pushing cart only once a month Cough: none Sleeping: on bipapand 3lpm  SABA use: none  02: 3-4lpm 24/7  Covid status:   never vaccinated  Rec vaccines against even the delta and omicron variants.  This can save your life as well as  those of your loved ones,  especially if they are also not vaccinated.   Make sure you check your oxygen saturation  at your highest level of activity    10/02/2021  f/u ov/Macalister Arnaud re:  NSIP / CAF     maint on 2lpm hs/ 2lpm - 4 lpm POC   Chief Complaint  Patient presents with   Follow-up    Breathing has been worse over the past 2 months. She has a lot of cough in the am- prod with green sputum.   Dyspnea:  [employer] occasionally / does laps around house x 5 min  but not consistently checking sats or titrating to > 90%  Cough: more than usual esp in am/ [employer] nasal congestion  Sleeping: on bipap on back and 2lpm per sleep clinic SABA use: none  02: as above  Covid status:  never    No obvious day to day or daytime variability or assoc  mucus plugs or hemoptysis or cp or chest tightness, subjective wheeze or overt sinus or hb symptoms.   Sleeping as above  without nocturnal  or early am exacerbation  of respiratory  c/o's or need for noct saba. Also denies any obvious fluctuation of symptoms with weather or environmental changes or other aggravating or alleviating factors except as outlined above   No unusual exposure hx or h/o childhood pna/ asthma or knowledge of premature birth.  Current Allergies, Complete Past Medical History, Past Surgical History, Family History, and Social History were reviewed in [employer] record.  ROS  The following are not active complaints unless bolded Hoarseness, sore throat, dysphagia, dental problems, itching, sneezing,  nasal congestion or discharge of excess mucus or purulent secretions, ear ache,   fever, chills, sweats, unintended wt loss or wt gain, classically pleuritic or exertional cp,  orthopnea pnd or arm/hand swelling  or leg swelling, presyncope, palpitations, abdominal pain, anorexia, nausea, vomiting, diarrhea  or change in bowel habits or change in bladder habits, change in stools or change in urine, dysuria, hematuria,  rash, arthralgias, visual complaints, headache, numbness, weakness or ataxia or problems with walking or coordination,  change in mood or  memory.        Current Meds  Medication Sig   ACCU-CHEK GUIDE test strip See admin instructions.   acetaminophen (TYLENOL) 500 MG tablet Take 500 mg by mouth every 6 (six) hours as needed for moderate pain.   apixaban (ELIQUIS) 5 MG TABS tablet Take 1 tablet (5 mg total) by mouth 2 (two) times daily.   ascorbic acid  (VITAMIN C) 500 MG tablet Take 500 mg by mouth daily. Takes one tablet every other day   atorvastatin (LIPITOR) 20 MG tablet SMARTSIG:1 Tablet(s) By Mouth Every Evening   brimonidine (ALPHAGAN) 0.2 % ophthalmic solution 1 drop 2 (two) times daily.   calcium citrate-vitamin D (CITRACAL+D) 315-200 MG-UNIT tablet Take 1 tablet by mouth daily.   cholecalciferol (VITAMIN D3) 25 MCG (1000 UNIT) tablet Take 1,000 Units by mouth daily.  Takes one tablet every other day   diltiazem (CARDIZEM CD) 240 MG 24 hr capsule TAKE ONE CAPSULE BY MOUTH ONCE DAILY   dofetilide (TIKOSYN) 500 MCG capsule TAKE ONE CAPSULE BY MOUTH TWICE DAILY   furosemide (LASIX) 40 MG tablet Take 1 tablet (40 mg total) by mouth daily. MUST [place] FOLLOW UP APPT FOR FURTHER REFILLS 1ST ATTEMPT (Patient taking differently: Take 40 mg by mouth daily.)   GINKGO BILOBA COMPLEX PO Take by mouth. Takes one tablet every other day   HYDROcodone-acetaminophen (NORCO/VICODIN) 5-325 MG tablet Take 1 tablet by mouth every 6 (six) hours as needed for moderate pain.   irbesartan (AVAPRO) 300 MG tablet Take 300 mg by mouth daily.   LORazepam (ATIVAN) 1 MG tablet Take 1 mg by mouth as needed.   Multiple Vitamin (MULTIVITAMIN) capsule Take 1 capsule by mouth daily.   Multiple Vitamins-Minerals (HAIR SKIN NAILS PO) Take by mouth. Takes one tablet every other day   OXYGEN 2lpm 24/7 and 4 lpm with exertion AHC   potassium chloride SA (KLOR-CON M) 20 MEQ tablet Take 1 tablet (20 mEq total) by mouth daily.   ROCKLATAN 0.02-0.005 % SOLN Apply 1 drop to eye at bedtime.   UNABLE TO FIND BIPAP with 2lpm o2 at night  AHC   vitamin B-12 (CYANOCOBALAMIN) 1000 MCG tablet Take 1,000 mcg by mouth every other day.                         Objective:   Physical Exam  10/02/2021            201   03/26/2021           208 09/25/2020         206 01/05/2020         202  01/06/2019         193  05/21/2018         190  11/18/2017         198  06/06/2017       200   .03/06/2017        204  12/05/2016         203   07/23/2016     208   06/25/16 204 lb 1.6 oz (92.6 kg)  02/06/16 204 lb 9.4 oz (92.8 kg)  02/25/08 163 lb (73.9 kg)    Vital signs reviewed  10/02/2021  - Note at rest 02 sats  96% on RA   General appearance:    amb obese wf nad    HEENT : pt wearing mask not removed for exam due to covid -19 concerns.    NECK :  without JVD/Nodes/TM/ nl carotid upstrokes bilaterally   LUNGS: no acc muscle use,  Nl contour chest with insp crackles bases  bilaterally without cough on insp or exp maneuvers   CV:  RRR  no s3 or murmur or increase in P2, and no edema   ABD:  soft and nontender with nl inspiratory excursion in the supine position. No bruits or organomegaly appreciated, bowel sounds nl  MS:  Nl gait/ ext warm without deformities, calf tenderness, cyanosis or clubbing No obvious joint restrictions   SKIN: warm and dry without lesions    NEURO:  alert, approp, nl sensorium with  no motor or cerebellar deficits apparent.       I personally reviewed images and agree with radiology impression as follows:   Chest CT 07/17/21  cuts on CT  angio Aorta There is no focal pulmonary consolidation. There is mild ectasia of bronchi. Subtle increased interstitial markings are seen in the periphery of both lungs with no significant interval change.            Assessment:

## 2021-10-02 NOTE — Assessment & Plan Note (Signed)
HCO3  10/29/2016  = 32   Qualified for 02  10/29/16  Saturations on Room Air at Rest =87% - Patient Saturations on 2lpm o2= 93% - Sats walking on 4lpm 90% Started bipap per Dr Leonides Sake 11/29/16  - 12/05/2016  Patient Saturations on Room Air at Rest =88% Patient Saturations on 2lpm o2= 93% - sats ok 06/06/2017 on 6 mw on 4lpm  - HCO3 10/01/17 = 25 so hypercarbia clearly better  - 05/21/2018   Walked RA  2 laps @ 185 ft each stopped due to desats to 86% corrected on 2lpm / slow pace  - 01/05/2020 RA 92% and on 3lpm POC sats 90% p 750 ft slow pace / stopping last lap due to sob  - 03/26/2021   Walked on 2lpm  x one lap =  approx 250 ft -@ avg pace stopped due to sob with sats 90%  - HC03  1/623  = 29   Improved on present rx including working on wt loss  As of 10/02/2021  rx = 2lpm hs and titrate daytime to keep sats > 90%           Each maintenance medication was reviewed in detail including emphasizing most importantly the difference between maintenance and prns and under what circumstances the prns are to be triggered using an action plan format where appropriate.  Total time for H and P, chart review, counseling, reviewing 02  device(s) and generating customized AVS unique to this office visit / same day charting = 25 min

## 2021-10-02 NOTE — Assessment & Plan Note (Signed)
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 However, there does appear to be a mild craniocaudal gradient. The possibility of early usual interstitial pneumonia (UIP) although not favored, is not entirely excluded. Accordingly, repeat high-resolution chest CT is recommended in 12 months  - PFT's  03/06/2017  F VC  1.77 (63%)  no % improvement from saba p 0  prior to study with DLCO  67/66 % corrects to 99  % for alv volume   - 61mw  06/06/2017   304 m on 4lpm s stopping or desats - HRCT chest 11/13/17 1. 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

## 2021-10-02 NOTE — Patient Instructions (Signed)
Make sure you check your oxygen saturation  AT  your highest level of activity (not after you stop)   to be sure it stays over 90% and adjust  02 flow upward to maintain this level if needed but remember to turn it back to previous settings when you stop (to conserve your supply).    Omnicef 300 mg twice daily should clear up your mucus - if not call for earlier f/u   Please schedule a follow up visit in 6 months but call sooner if needed

## 2021-10-08 ENCOUNTER — Other Ambulatory Visit: Payer: Self-pay

## 2021-10-08 ENCOUNTER — Telehealth: Payer: Self-pay | Admitting: Gastroenterology

## 2021-10-08 DIAGNOSIS — K862 Cyst of pancreas: Secondary | ICD-10-CM

## 2021-10-08 NOTE — Telephone Encounter (Signed)
Called patient and let her know Dr. Rush Landmark wants her to do blood work (CA 19-9) before proceeding with the EUS. She agreed to come into our lab in tomorrow.

## 2021-10-08 NOTE — Telephone Encounter (Signed)
Patient is calling to follow up on EUS procedure.

## 2021-10-09 ENCOUNTER — Other Ambulatory Visit: Payer: Medicare Other

## 2021-10-09 ENCOUNTER — Ambulatory Visit: Payer: Medicare Other | Admitting: Podiatry

## 2021-10-09 ENCOUNTER — Other Ambulatory Visit: Payer: Self-pay

## 2021-10-09 DIAGNOSIS — M79675 Pain in left toe(s): Secondary | ICD-10-CM | POA: Diagnosis not present

## 2021-10-09 DIAGNOSIS — B351 Tinea unguium: Secondary | ICD-10-CM | POA: Diagnosis not present

## 2021-10-09 DIAGNOSIS — K862 Cyst of pancreas: Secondary | ICD-10-CM | POA: Diagnosis not present

## 2021-10-09 DIAGNOSIS — M79674 Pain in right toe(s): Secondary | ICD-10-CM

## 2021-10-09 DIAGNOSIS — E1169 Type 2 diabetes mellitus with other specified complication: Secondary | ICD-10-CM | POA: Diagnosis not present

## 2021-10-09 NOTE — Patient Instructions (Addendum)
If you want to call your insurance to check how often they will cover the nail trim below is the information:  The code for the nail trim is 11721  The diagnosis codes are: B35.1, M79.674, M79.675, Ell.49

## 2021-10-10 DIAGNOSIS — H401131 Primary open-angle glaucoma, bilateral, mild stage: Secondary | ICD-10-CM | POA: Diagnosis not present

## 2021-10-10 LAB — CA 19-9 (SERIAL): CA 19-9: 12 U/mL (ref 0–35)

## 2021-10-13 NOTE — Progress Notes (Signed)
Subjective: 71 year old female presents the office today for concerns of thick, elongated toenails that become ingrown and she cannot trim them herself and causing discomfort.  The right third nail starting to grow back in.  Denies any ulcerations or new concerns.   Objective: AAO x3, NAD DP/PT pulses palpable bilaterally, CRT less than 3 seconds Nails are mildly hypertrophic, dystrophic with yellow discoloration causing ingrowing of the nails on both nail borders on multiple toenails.  There is no edema, erythema or any signs of infection.  She has tenderness nails 1 through 5 bilaterally. No open lesions or pre-ulcerative lesions.  No pain with calf compression, swelling, warmth, erythema  Assessment: Symptomatic onychomycosis, ingrown toenails  Plan: -All treatment options discussed with the patient including all alternatives, risks, complications.  -Sharply debrided the nails x 10 without any complications or bleeding. -Discussed daily foot inspection.  -Patient encouraged to call the office with any questions, concerns, change in symptoms.   Return in about 6 weeks (around 11/23/2021).-She wants to come in every 45 days.  I gave her the codes to contact her insurance company to see if it will be covered before the standard.

## 2021-10-17 DIAGNOSIS — I10 Essential (primary) hypertension: Secondary | ICD-10-CM | POA: Diagnosis not present

## 2021-10-17 DIAGNOSIS — E1169 Type 2 diabetes mellitus with other specified complication: Secondary | ICD-10-CM | POA: Diagnosis not present

## 2021-10-17 DIAGNOSIS — I5032 Chronic diastolic (congestive) heart failure: Secondary | ICD-10-CM | POA: Diagnosis not present

## 2021-10-22 ENCOUNTER — Telehealth: Payer: Self-pay | Admitting: Gastroenterology

## 2021-10-22 NOTE — Telephone Encounter (Signed)
Pt reports she has had the blood work done that she was supposed to have drawn. She is calling wanting to know if she has been scheduled for the EUS yet. Patty do you know anything about this for the pt?

## 2021-10-22 NOTE — Telephone Encounter (Signed)
As per result note from MRI/MRCP with Dr. Tomasa Rand, please move forward with scheduling EUS with possible FNA with DJ or myself. Thanks. GM

## 2021-10-22 NOTE — Telephone Encounter (Signed)
Dr Meridee Score or Tomasa Rand please advise if pt needs EUS?

## 2021-10-22 NOTE — Telephone Encounter (Signed)
Patient stated that she has done her blood work and is wanting to discuss EUS procedure. Please advise.

## 2021-10-23 ENCOUNTER — Other Ambulatory Visit: Payer: Self-pay

## 2021-10-23 ENCOUNTER — Telehealth: Payer: Self-pay

## 2021-10-23 DIAGNOSIS — K862 Cyst of pancreas: Secondary | ICD-10-CM

## 2021-10-23 NOTE — Telephone Encounter (Signed)
[facility] Pre-operative Risk Assessment     Request for surgical clearance:     Endoscopy Procedure  What type of surgery is being performed?     EUS  When is this surgery scheduled?     12/10/21  What type of clearance is required ?   Pharmacy  Are there any medications that need to be held prior to surgery and how long? Eliquis  Practice name and name of physician performing surgery?      [facility]  What is your office phone and fax number?      Phone- [phone]  [phone]  Anesthesia type (None, local, MAC, general) ?       MAC

## 2021-10-23 NOTE — Telephone Encounter (Signed)
EUS has been scheduled for 12/10/21 at 1 pm at [facility] with GM  Eliquis letter sent to cardiology

## 2021-10-23 NOTE — Telephone Encounter (Signed)
EUS scheduled, pt instructed and medications reviewed.  Patient instructions mailed to home and sent to My Chart .  Patient to call with any questions or concerns.  

## 2021-10-24 NOTE — Telephone Encounter (Signed)
Patient with diagnosis of afib on Eliquis for anticoagulation.   ? ?Procedure: upper endoscopic ultrasound ?Date of procedure: 12/10/21 ? ?CHA2DS2-VASc Score = 6  ?This indicates a 9.7% annual risk of stroke. ?The patient's score is based upon: ?CHF History: 1 ?HTN History: 1 ?Diabetes History: 1 ?Stroke History: 0 ?Vascular Disease History: 1 ?Age Score: 1 ?Gender Score: 1 ? ?CrCl 90mL/min using adjusted body weight ?Platelet count 188K ? ?Per office protocol, patient can hold Eliquis for 1-2 days prior to procedure.   ?

## 2021-10-25 NOTE — Telephone Encounter (Signed)
I have made the pt aware to hold Eliquis 2 days prior to the appt.   ?

## 2021-10-25 NOTE — Telephone Encounter (Signed)
? ?  Primary Cardiologist: Larae Grooms, MD ? ?Chart reviewed as part of pre-operative protocol coverage. Given past medical history and time since last visit, based on ACC/AHA guidelines, [name] would be at acceptable risk for the planned procedure without further cardiovascular testing.  ? ?Patient with diagnosis of afib on Eliquis for anticoagulation.   ?  ?Procedure: upper endoscopic ultrasound ?Date of procedure: 12/10/21 ?  ?CHA2DS2-VASc Score = 6  ?This indicates a 9.7% annual risk of stroke. ?The patient's score is based upon: ?CHF History: 1 ?HTN History: 1 ?Diabetes History: 1 ?Stroke History: 0 ?Vascular Disease History: 1 ?Age Score: 1 ?Gender Score: 1 ?  ?CrCl 67mL/min using adjusted body weight ?Platelet count 188K ?  ?Per office protocol, patient can hold Eliquis for 1-2 days prior to procedure ? ?I will route this recommendation to the requesting party via Epic fax function and remove from pre-op pool. ? ?Please call with questions. ? ?[name]. Toni Hevia NP-C ? ?  ?10/25/2021, 8:36 AM ?[place] ?[address] ?Office [phone] Fax [phone] ? ? ? ? ?

## 2021-10-29 DIAGNOSIS — R069 Unspecified abnormalities of breathing: Secondary | ICD-10-CM | POA: Diagnosis not present

## 2021-10-29 DIAGNOSIS — J9601 Acute respiratory failure with hypoxia: Secondary | ICD-10-CM | POA: Diagnosis not present

## 2021-10-29 DIAGNOSIS — J449 Chronic obstructive pulmonary disease, unspecified: Secondary | ICD-10-CM | POA: Diagnosis not present

## 2021-11-05 ENCOUNTER — Other Ambulatory Visit (HOSPITAL_COMMUNITY): Payer: Self-pay | Admitting: Nurse Practitioner

## 2021-11-09 ENCOUNTER — Other Ambulatory Visit (HOSPITAL_COMMUNITY): Payer: Self-pay

## 2021-11-09 MED ORDER — APIXABAN 5 MG PO TABS: 5.0000 mg | ORAL_TABLET | Freq: Two times a day (BID) | ORAL | 0 refills | Status: DC

## 2021-11-14 DIAGNOSIS — I5032 Chronic diastolic (congestive) heart failure: Secondary | ICD-10-CM | POA: Diagnosis not present

## 2021-11-14 DIAGNOSIS — E1169 Type 2 diabetes mellitus with other specified complication: Secondary | ICD-10-CM | POA: Diagnosis not present

## 2021-11-14 DIAGNOSIS — N183 Chronic kidney disease, stage 3 unspecified: Secondary | ICD-10-CM | POA: Diagnosis not present

## 2021-11-14 DIAGNOSIS — I10 Essential (primary) hypertension: Secondary | ICD-10-CM | POA: Diagnosis not present

## 2021-11-14 DIAGNOSIS — E7800 Pure hypercholesterolemia, unspecified: Secondary | ICD-10-CM | POA: Diagnosis not present

## 2021-11-14 DIAGNOSIS — I4891 Unspecified atrial fibrillation: Secondary | ICD-10-CM | POA: Diagnosis not present

## 2021-11-20 ENCOUNTER — Other Ambulatory Visit: Payer: Self-pay

## 2021-11-20 ENCOUNTER — Ambulatory Visit: Payer: Medicare Other | Admitting: Podiatry

## 2021-11-20 DIAGNOSIS — B351 Tinea unguium: Secondary | ICD-10-CM | POA: Diagnosis not present

## 2021-11-20 DIAGNOSIS — Z7901 Long term (current) use of anticoagulants: Secondary | ICD-10-CM | POA: Diagnosis not present

## 2021-11-20 DIAGNOSIS — M79674 Pain in right toe(s): Secondary | ICD-10-CM | POA: Diagnosis not present

## 2021-11-20 DIAGNOSIS — J9610 Chronic respiratory failure, unspecified whether with hypoxia or hypercapnia: Secondary | ICD-10-CM | POA: Diagnosis not present

## 2021-11-20 DIAGNOSIS — M79675 Pain in left toe(s): Secondary | ICD-10-CM

## 2021-11-20 DIAGNOSIS — E1169 Type 2 diabetes mellitus with other specified complication: Secondary | ICD-10-CM

## 2021-11-20 DIAGNOSIS — G4733 Obstructive sleep apnea (adult) (pediatric): Secondary | ICD-10-CM | POA: Diagnosis not present

## 2021-11-20 DIAGNOSIS — J449 Chronic obstructive pulmonary disease, unspecified: Secondary | ICD-10-CM | POA: Diagnosis not present

## 2021-11-25 NOTE — Progress Notes (Signed)
Subjective: ?71 year old female presents the office today for concerns of thick, elongated toenails that she cannot trim her self.  She again states to get ingrown to fully get along and she is difficulty getting them.  Denies any drainage or pus.  No fevers or chills.  No other concerns.  ? ?Objective: ?AAO x3, NAD ?DP/PT pulses palpable bilaterally, CRT less than 3 seconds ?Nails are mildly hypertrophic, dystrophic with yellow discoloration causing ingrowing of the nails on both nail borders on multiple toenails.  There is no edema, erythema or any signs of infection.  She has tenderness nails 1 through 5 bilaterally. ?No open lesions or pre-ulcerative lesions.  ?No pain with calf compression, swelling, warmth, erythema ? ?Assessment: ?Symptomatic onychomycosis, ingrown toenails ? ?Plan: ?-All treatment options discussed with the patient including all alternatives, risks, complications.  ?-Sharply debrided the nails x 10 without any complications or bleeding. ?-Discussed daily foot inspection.  ?-Patient encouraged to call the office with any questions, concerns, change in symptoms.  ? ?Return in about 6 weeks (around 11/23/2021).-She wants to come in every 45 days.   ? ?Trula Slade DPM ? ?

## 2021-11-29 ENCOUNTER — Other Ambulatory Visit: Payer: Self-pay | Admitting: Thoracic Surgery (Cardiothoracic Vascular Surgery)

## 2021-11-29 DIAGNOSIS — I712 Thoracic aortic aneurysm, without rupture, unspecified: Secondary | ICD-10-CM

## 2021-11-29 DIAGNOSIS — J449 Chronic obstructive pulmonary disease, unspecified: Secondary | ICD-10-CM | POA: Diagnosis not present

## 2021-11-29 DIAGNOSIS — R069 Unspecified abnormalities of breathing: Secondary | ICD-10-CM | POA: Diagnosis not present

## 2021-11-29 DIAGNOSIS — J9601 Acute respiratory failure with hypoxia: Secondary | ICD-10-CM | POA: Diagnosis not present

## 2021-12-05 ENCOUNTER — Encounter (HOSPITAL_COMMUNITY): Payer: Self-pay | Admitting: Gastroenterology

## 2021-12-07 ENCOUNTER — Other Ambulatory Visit (HOSPITAL_COMMUNITY): Payer: Self-pay

## 2021-12-07 MED ORDER — APIXABAN 5 MG PO TABS: 5.0000 mg | ORAL_TABLET | Freq: Two times a day (BID) | ORAL | 0 refills | Status: DC

## 2021-12-10 ENCOUNTER — Ambulatory Visit (HOSPITAL_COMMUNITY)
Admission: RE | Admit: 2021-12-10 | Discharge: 2021-12-10 | Disposition: A | Discharge status: Home / Self Care | Payer: Medicare Other | Source: Ambulatory Visit | Attending: Gastroenterology | Admitting: Gastroenterology

## 2021-12-10 ENCOUNTER — Ambulatory Visit (HOSPITAL_BASED_OUTPATIENT_CLINIC_OR_DEPARTMENT_OTHER): Payer: Medicare Other | Admitting: Certified Registered Nurse Anesthetist

## 2021-12-10 ENCOUNTER — Encounter (HOSPITAL_COMMUNITY): Payer: Self-pay | Admitting: Gastroenterology

## 2021-12-10 ENCOUNTER — Ambulatory Visit (HOSPITAL_COMMUNITY): Payer: Medicare Other | Admitting: Certified Registered Nurse Anesthetist

## 2021-12-10 ENCOUNTER — Other Ambulatory Visit: Payer: Self-pay

## 2021-12-10 ENCOUNTER — Encounter (HOSPITAL_COMMUNITY)
Admission: RE | Disposition: A | Discharge status: Home / Self Care | Payer: Self-pay | Source: Ambulatory Visit | Attending: Gastroenterology

## 2021-12-10 DIAGNOSIS — I509 Heart failure, unspecified: Secondary | ICD-10-CM | POA: Diagnosis not present

## 2021-12-10 DIAGNOSIS — I2510 Atherosclerotic heart disease of native coronary artery without angina pectoris: Secondary | ICD-10-CM | POA: Insufficient documentation

## 2021-12-10 DIAGNOSIS — I110 Hypertensive heart disease with heart failure: Secondary | ICD-10-CM | POA: Diagnosis not present

## 2021-12-10 DIAGNOSIS — K2289 Other specified disease of esophagus: Secondary | ICD-10-CM | POA: Diagnosis not present

## 2021-12-10 DIAGNOSIS — Z7901 Long term (current) use of anticoagulants: Secondary | ICD-10-CM | POA: Diagnosis not present

## 2021-12-10 DIAGNOSIS — E119 Type 2 diabetes mellitus without complications: Secondary | ICD-10-CM | POA: Diagnosis not present

## 2021-12-10 DIAGNOSIS — Z87891 Personal history of nicotine dependence: Secondary | ICD-10-CM | POA: Diagnosis not present

## 2021-12-10 DIAGNOSIS — K5710 Diverticulosis of small intestine without perforation or abscess without bleeding: Secondary | ICD-10-CM | POA: Diagnosis not present

## 2021-12-10 DIAGNOSIS — K31A19 Gastric intestinal metaplasia without dysplasia, unspecified site: Secondary | ICD-10-CM | POA: Diagnosis not present

## 2021-12-10 DIAGNOSIS — K8689 Other specified diseases of pancreas: Secondary | ICD-10-CM | POA: Diagnosis not present

## 2021-12-10 DIAGNOSIS — I899 Noninfective disorder of lymphatic vessels and lymph nodes, unspecified: Secondary | ICD-10-CM | POA: Diagnosis not present

## 2021-12-10 DIAGNOSIS — K31A11 Gastric intestinal metaplasia without dysplasia, involving the antrum: Secondary | ICD-10-CM | POA: Insufficient documentation

## 2021-12-10 DIAGNOSIS — I5032 Chronic diastolic (congestive) heart failure: Secondary | ICD-10-CM | POA: Diagnosis not present

## 2021-12-10 DIAGNOSIS — K209 Esophagitis, unspecified without bleeding: Secondary | ICD-10-CM

## 2021-12-10 DIAGNOSIS — K862 Cyst of pancreas: Secondary | ICD-10-CM | POA: Diagnosis not present

## 2021-12-10 DIAGNOSIS — K2970 Gastritis, unspecified, without bleeding: Secondary | ICD-10-CM | POA: Diagnosis not present

## 2021-12-10 DIAGNOSIS — K319 Disease of stomach and duodenum, unspecified: Secondary | ICD-10-CM | POA: Diagnosis not present

## 2021-12-10 DIAGNOSIS — K449 Diaphragmatic hernia without obstruction or gangrene: Secondary | ICD-10-CM | POA: Insufficient documentation

## 2021-12-10 DIAGNOSIS — N2889 Other specified disorders of kidney and ureter: Secondary | ICD-10-CM | POA: Insufficient documentation

## 2021-12-10 HISTORY — PX: ESOPHAGOGASTRODUODENOSCOPY (EGD) WITH PROPOFOL: SHX5813

## 2021-12-10 HISTORY — PX: FINE NEEDLE ASPIRATION: SHX6590

## 2021-12-10 HISTORY — PX: BIOPSY: SHX5522

## 2021-12-10 HISTORY — PX: EUS: SHX5427

## 2021-12-10 LAB — GLUCOSE, CAPILLARY: Glucose-Capillary: 77 mg/dL (ref 70–99)

## 2021-12-10 SURGERY — UPPER ENDOSCOPIC ULTRASOUND (EUS) RADIAL
Anesthesia: Monitor Anesthesia Care

## 2021-12-10 MED ORDER — CIPROFLOXACIN IN D5W 400 MG/200ML IV SOLN
INTRAVENOUS | Status: AC
  Filled 2021-12-10: qty 200

## 2021-12-10 MED ORDER — LACTATED RINGERS IV SOLN
INTRAVENOUS | Status: DC
  Administered 2021-12-10: 1000 mL via INTRAVENOUS

## 2021-12-10 MED ORDER — PROPOFOL 500 MG/50ML IV EMUL
INTRAVENOUS | Status: DC | PRN
  Administered 2021-12-10: 125 ug/kg/min via INTRAVENOUS

## 2021-12-10 MED ORDER — PROPOFOL 10 MG/ML IV BOLUS
INTRAVENOUS | Status: DC | PRN
  Administered 2021-12-10 (×2): 10 mg via INTRAVENOUS
  Administered 2021-12-10: 20 mg via INTRAVENOUS
  Administered 2021-12-10: 30 mg via INTRAVENOUS
  Administered 2021-12-10: 20 mg via INTRAVENOUS

## 2021-12-10 MED ORDER — LIDOCAINE 2% (20 MG/ML) 5 ML SYRINGE
INTRAMUSCULAR | Status: DC | PRN
  Administered 2021-12-10: 100 mg via INTRAVENOUS

## 2021-12-10 MED ORDER — AMOXICILLIN-POT CLAVULANATE 875-125 MG PO TABS: 1.0000 | ORAL_TABLET | Freq: Two times a day (BID) | ORAL | 0 refills | Status: AC

## 2021-12-10 MED ORDER — APIXABAN 5 MG PO TABS: 5.0000 mg | ORAL_TABLET | Freq: Two times a day (BID) | ORAL | 0 refills | Status: DC

## 2021-12-10 MED ORDER — PHENYLEPHRINE 40 MCG/ML (10ML) SYRINGE FOR IV PUSH (FOR BLOOD PRESSURE SUPPORT)
PREFILLED_SYRINGE | INTRAVENOUS | Status: DC | PRN
  Administered 2021-12-10: 80 ug via INTRAVENOUS

## 2021-12-10 MED ORDER — OMEPRAZOLE 40 MG PO CPDR: 40.0000 mg | DELAYED_RELEASE_CAPSULE | Freq: Every day | ORAL | 6 refills | Status: DC

## 2021-12-10 MED ORDER — CIPROFLOXACIN IN D5W 400 MG/200ML IV SOLN
INTRAVENOUS | Status: DC | PRN
  Administered 2021-12-10: 400 mg via INTRAVENOUS

## 2021-12-10 NOTE — Anesthesia Preprocedure Evaluation (Addendum)
Anesthesia Evaluation  ?Patient identified by MRN, date of birth, ID band ?Patient awake ? ? ? ?Reviewed: ?Allergy & Precautions, NPO status , Patient's Chart, lab work & pertinent test results ? ?Airway ?Mallampati: II ? ?TM Distance: >3 FB ?Neck ROM: Full ? ? ? Dental ? ?(+) Dental Advisory Given, Teeth Intact ?  ?Pulmonary ?shortness of breath, sleep apnea , pneumonia, COPD, former smoker,  ?  ?Pulmonary exam normal ?breath sounds clear to auscultation ?+ decreased breath sounds ? ? ? ? ? Cardiovascular ?hypertension, Pt. on medications and Pt. on home beta blockers ?+ CAD and +CHF  ?Normal cardiovascular exam+ dysrhythmias Atrial Fibrillation  ?Rhythm:Regular Rate:Normal ? ?Echo 2019 ?- Left ventricle: Abnormal septal motion There was moderate concentric hypertrophy. Systolic function was normal. The ?estimated ejection fraction was in the range of 50% to 55%. Left ?ventricular diastolic function parameters were normal.  ?- Left atrium: The atrium was moderately dilated.  ?- Atrial septum: No defect or patent foramen ovale was identified.  ?- Pulmonary arteries: PA peak pressure: 41 mm Hg (S).  ?  ?Neuro/Psych ?PSYCHIATRIC DISORDERS Anxiety Depression   ? GI/Hepatic ?negative GI ROS, Neg liver ROS,   ?Endo/Other  ?diabetesHyperthyroidism  ? Renal/GU ?Renal disease  ? ?  ?Musculoskeletal ? ?(+) Arthritis , Osteoarthritis,   ? Abdominal ?  ?Peds ? Hematology ?negative hematology ROS ?(+)   ?Anesthesia Other Findings ? ? Reproductive/Obstetrics ?negative OB ROS ? ?  ? ? ? ? ? ? ? ? ? ? ? ? ? ?  ?  ? ? ? ? ? ? ?08/2016 EKG: atrial flutter. ? ? ?Anesthesia Physical ? ?Anesthesia Plan ? ?ASA: 3 ? ?Anesthesia Plan: MAC  ? ?Post-op Pain Management: Minimal or no pain anticipated  ? ?Induction: Intravenous ? ?PONV Risk Score and Plan: 2 and Propofol infusion, TIVA and Treatment may vary due to age or medical condition ? ?Airway Management Planned: Natural Airway ? ?Additional Equipment:   ? ?Intra-op Plan:  ? ?Post-operative Plan:  ? ?Informed Consent: I have reviewed the patients History and Physical, chart, labs and discussed the procedure including the risks, benefits and alternatives for the proposed anesthesia with the patient or authorized representative who has indicated his/her understanding and acceptance.  ? ? ? ?Dental advisory given ? ?Plan Discussed with: CRNA ? ?Anesthesia Plan Comments:   ? ? ? ? ? ?Anesthesia Quick Evaluation ? ?

## 2021-12-10 NOTE — Op Note (Signed)
[facility] ?Patient Name: [name] ?Procedure Date: 12/10/2021 ?MRN: [id number] ?Attending MD: Justice Britain , MD ?Date of Birth: April 16, 1951 ?CSN: [id number] ?Age: 70 ?Admit Type: Outpatient ?Procedure:                Upper EUS ?Indications:              Pancreatic cyst on MRCP ?Providers:                Justice Britain, MD, Jeanella Cara, RN,  ?                          Tyna Jaksch Technician ?Referring MD:              ?Medicines:                Monitored Anesthesia Care ?Complications:            No immediate complications. ?Estimated Blood Loss:     Estimated blood loss was minimal. ?Procedure:                Pre-Anesthesia Assessment: ?                          - Prior to the procedure, a History and Physical  ?                          was performed, and patient medications and  ?                          allergies were reviewed. The patient's tolerance of  ?                          previous anesthesia was also reviewed. The risks  ?                          and benefits of the procedure and the sedation  ?                          options and risks were discussed with the patient.  ?                          All questions were answered, and informed consent  ?                          was obtained. Prior Anticoagulants: The patient has  ?                          taken Eliquis (apixaban), last dose was 3 days  ?                          prior to procedure. ASA Grade Assessment: III - A  ?                          patient with severe systemic disease. After  ?  reviewing the risks and benefits, the patient was  ?                          deemed in satisfactory condition to undergo the  ?                          procedure. ?                          After obtaining informed consent, the endoscope was  ?                          passed under direct vision. Throughout the  ?                          procedure, the patient's blood pressure, pulse,  and  ?                          oxygen saturations were monitored continuously. The  ?                          [id number]) Olympus endoscope was introduced  ?                          through the mouth, and advanced to the second part  ?                          of duodenum. The [id number]) Olympus  ?                          duodenoscope was introduced through the mouth, and  ?                          advanced to the area of papilla. The [id number]  ?                          [id number]) Olympus linear ultrasound scope was  ?                          introduced through the mouth, and advanced to the  ?                          duodenum for ultrasound examination from the  ?                          stomach and duodenum. The upper EUS was  ?                          accomplished without difficulty. The patient  ?                          tolerated the procedure. ?Scope In: ?Scope Out: ?Findings: ?     ENDOSCOPIC FINDING: : ?     A few medium-sized prominent veins were found in the proximal  esophagus. ?     No gross lesions were noted in the mid esophagus. ?     LA Grade B (one or more mucosal breaks greater than 5 mm, not extending  ?     between the tops of two mucosal folds) esophagitis with no bleeding was  ?     found in the distal esophagus. ?     The Z-line was irregular and was found 40 cm from the incisors. ?     A 3 cm hiatal hernia was present. ?     Segmental moderate inflammation characterized by erosions, erythema,  ?     friability and linear erosions was found in the entire examined stomach.  ?     Biopsies were taken with a cold forceps for histology and Helicobacter  ?     pylori testing. ?     The major papilla was normal. ?     A medium non-bleeding diverticulum was found in the area of the papilla. ?     No gross lesions were noted in the duodenal bulb, in the first portion  ?     of the duodenum and in the second portion of the duodenum. ?     ENDOSONOGRAPHIC FINDING: : ?      Anechoic lesions suggestive of multiple cysts were identified in the  ?     pancreatic head, genu of the pancreas and pancreatic body. The largest  ?     cyst measured 31 mm by 20 mm in the head of the pancreas. The second  ?     cyst in the HOP measured 7 mm by 6 mm. The third cyst in the HOP  ?     measured 10 mm by 9 mm. The fourth cyst in NOP measured 5 mm by 4 mm.  ?     The fifth cyst in BOP measured 3 mm by 4 mm. There was no associated  ?     mass. Diagnostic needle aspiration for fluid was performed of the  ?     largest cyst. Color Doppler imaging was utilized prior to needle  ?     puncture to confirm a lack of significant vascular structures within the  ?     needle path. One pass was made with the Expect 22 gauge needle using a  ?     transduodenal approach. No stylet was used. The amount of fluid  ?     collected was 10 mL. The fluid was clear, white and watery. The cyst was  ?     decompressed. Sample(s) were sent for amylase concentration, cytology  ?     and CEA. ?     The pancreatic duct had a normal endosonographic appearance in the  ?     pancreatic head, genu of the pancreas, body of the pancreas and tail of  ?     the pancreas. ?     Pancreatic parenchymal abnormalities were noted in the entire pancreas.  ?     These consisted of hyperechoic strands. ?     There was no sign of significant endosonographic abnormality in the  ?     common bile duct (3.0 mm) and in the common hepatic duct (5.3 mm). No  ?     stones and ducts of normal caliber were identified. ?     There was no sign of significant endosonographic abnormality at the  ?  ampulla. No masses and no wall thickening were identified. ?     Endosonographic imaging in the visualized portion of the liver showed no  ?     mass. ?     No malignant-appearing lymph nodes were visualized in the celiac region  ?     (level 20), peripancreatic region and porta hepatis region. ?     An anechoic lesion suggestive of a cyst was identified in the  visualized  ?     portion of the left kidney. There was a single compartment. ?     The celiac region was visualized. ?Impression:               - Prominent veins found in the proximal esophagus.  ?                          No gross lesions in esophagus in middle esophagus.  ?                          LA Grade B esophagitis with no bleeding - distally.  ?                          Z-line irregular, 40 cm from the incisors. ?                          - 3 cm hiatal hernia. ?                          - Gastritis. Biopsied. ?                          - Normal major papilla. Non-bleeding duodenal  ?                          diverticulum adjacent to ampulla. ?                          - No other gross lesions in the duodenal bulb, in  ?                          the first portion of the duodenum and in the second  ?                          portion of the duodenum. ?                          EUS Impression: ?                          - Multiple cystic lesions were seen in the  ?                          pancreatic head, genu of the pancreas and  ?                          pancreatic body. Tissue has not been obtained.  ?  However, the endosonographic appearance is  ?                          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                          cyst may well be a SCA rather than mucinous. Fine  ?                          needle aspiration for fluid performed of the  ?                          largest cyst was performed. ?                          - The pancreatic duct had a normal endosonographic  ?                          appearance in the pancreatic head, genu of the  ?                          pancreas, body of the pancreas and tail of the  ?                          pancreas. ?                          - Pancreatic parenchymal abnormalities consisting  ?                          of hyperechoic  strands were noted in the entire  ?                          pancreas. ?                          - There was no sign of significant pathology in the  ?                          common bile duct and in the common

## 2021-12-10 NOTE — Transfer of Care (Signed)
Immediate Anesthesia Transfer of Care Note ? ?Patient: [name] ? ?Procedure(s) Performed: UPPER ENDOSCOPIC ULTRASOUND (EUS) RADIAL ?BIOPSY ?FINE NEEDLE ASPIRATION ? ?Patient Location: Endoscopy Unit ? ?Anesthesia Type:MAC ? ?Level of Consciousness: drowsy and patient cooperative ? ?Airway & Oxygen Therapy: Patient Spontanous Breathing and Patient connected to face mask oxygen ? ?Post-op Assessment: Report given to RN and Post -op Vital signs reviewed and stable ? ?Post vital signs: Reviewed and stable ? ?Last Vitals:  ?Vitals Value Taken Time  ?BP    ?Temp    ?Pulse    ?Resp    ?SpO2    ? ? ?Last Pain:  ?Vitals:  ? 12/10/21 1242  ?TempSrc: Temporal  ?PainSc: 0-No pain  ?   ? ?  ? ?Complications: No notable events documented. ?

## 2021-12-10 NOTE — H&P (Addendum)
? ?GASTROENTEROLOGY PROCEDURE H&P NOTE  ? ?Primary Care Physician: ?Shirline Frees, MD ? ?HPI: ?[name] is a 71 y.o. female who presents for EGD/EUS to evaluate multicystic mass within the [facility]. ? ?Past Medical History:  ?Diagnosis Date  ? Chronic diastolic CHF (congestive heart failure) ([place])   ? Chronic respiratory failure ([place])   ? Coronary atherosclerosis   ? a. 2V by CT 10/2017.  ? Emphysema of lung ([place])   ? Hypercholesterolemia   ? Hypertension   ? NSIP (nonspecific interstitial pneumonia) ([place])   ? Osteoarthritis   ? Persistent atrial fibrillation ([place])   ? Thoracic aortic ectasia ([facility])   ? ?Past Surgical History:  ?Procedure Laterality Date  ? CARDIOVERSION N/A 07/25/2016  ? Procedure: CARDIOVERSION;  Surgeon: Fay Records, MD;  Location: [place];  Service: Cardiovascular;  Laterality: N/A;  ? CARDIOVERSION N/A 09/14/2016  ? Procedure: CARDIOVERSION;  Surgeon: Thompson Grayer, MD;  Location: [facility] OR;  Service: Cardiovascular;  Laterality: N/A;  ? CATARACT EXTRACTION Bilateral   ? LEG SURGERY Right   ? TEE WITHOUT CARDIOVERSION N/A 07/25/2016  ? Procedure: TRANSESOPHAGEAL ECHOCARDIOGRAM (TEE);  Surgeon: Fay Records, MD;  Location: [place];  Service: Cardiovascular;  Laterality: N/A;  ? ?No current facility-administered medications for this encounter.  ? ?No current facility-administered medications for this encounter. ?Allergies  ?Allergen Reactions  ? Oxycodone Hcl Itching  ? Levaquin [Levofloxacin In D5w] Itching and Rash  ? Percocet [Oxycodone-Acetaminophen] Itching  ? ?Family History  ?Problem Relation Age of Onset  ? Heart disease Father   ? Heart attack Sister   ? Stroke Neg Hx   ? ?Social History  ? ?Socioeconomic History  ? Marital status: Widowed  ?  Spouse name: Not on file  ? Number of children: Not on file  ? Years of education: Not on file  ? Highest education level: Not on file  ?Occupational History  ? Not on file  ?Tobacco Use  ? Smoking status: Former  ?  Packs/day: 1.00  ?   Years: 43.00  ?  Pack years: 43.00  ?  Types: Cigarettes  ?  Start date: 08/26/1966  ?  Quit date: 08/26/2010  ?  Years since quitting: 11.2  ? Smokeless tobacco: Never  ?Vaping Use  ? Vaping Use: Never used  ?Substance and Sexual Activity  ? Alcohol use: No  ?  Alcohol/week: 0.0 standard drinks  ? Drug use: No  ? Sexual activity: Not on file  ?Other Topics Concern  ? Not on file  ?Social History Narrative  ? Not on file  ? ?Social Determinants of Health  ? ?Financial Resource Strain: Not on file  ?Food Insecurity: Not on file  ?Transportation Needs: Not on file  ?Physical Activity: Not on file  ?Stress: Not on file  ?Social Connections: Not on file  ?Intimate Partner Violence: Not on file  ? ? ?Physical Exam: ?There were no vitals filed for this visit. ?There is no height or weight on file to calculate BMI. ?GEN: NAD ?EYE: Sclerae anicteric ?ENT: MMM ?CV: Non-tachycardic ?GI: Soft, NT/ND ?NEURO:  Alert & Oriented x 3 ? ?Lab Results: ?No results for input(s): WBC, HGB, HCT, PLT in the last 72 hours. ?BMET ?No results for input(s): NA, K, CL, CO2, GLUCOSE, BUN, CREATININE, CALCIUM in the last 72 hours. ?LFT ?No results for input(s): PROT, ALBUMIN, AST, ALT, ALKPHOS, BILITOT, BILIDIR, IBILI in the last 72 hours. ?PT/INR ?No results for input(s): LABPROT, INR in the last 72 hours. ? ? ?  Impression / Plan: ?This is a 71 y.o.female who presents for EGD/EUS to evaluate multicystic mass within the HOP (normal CA19-9 x 2). ? ?The risks of an EUS including intestinal perforation, bleeding, infection, aspiration, and medication effects were discussed as was the possibility it may not give a definitive diagnosis if a biopsy is performed.  When a biopsy of the pancreas is done as part of the EUS, there is an additional risk of pancreatitis at the rate of about 1-2%.  It was explained that procedure related pancreatitis is typically mild, although it can be severe and even life threatening, which is why we do not perform random  pancreatic biopsies and only biopsy a lesion/area we feel is concerning enough to warrant the risk. ? ?The risks and benefits of endoscopic evaluation/treatment were discussed with the patient and/or family; these include but are not limited to the risk of perforation, infection, bleeding, missed lesions, lack of diagnosis, severe illness requiring hospitalization, as well as anesthesia and sedation related illnesses.  The patient's history has been reviewed, patient examined, no change in status, and deemed stable for procedure.  The patient and/or family is agreeable to proceed.  ? ? ?Justice Britain, MD ?[facility] ?Advanced Endoscopy ?Office # [phone] ? ? ?

## 2021-12-11 ENCOUNTER — Encounter (HOSPITAL_COMMUNITY): Payer: Self-pay | Admitting: Gastroenterology

## 2021-12-11 DIAGNOSIS — I10 Essential (primary) hypertension: Secondary | ICD-10-CM | POA: Diagnosis not present

## 2021-12-11 DIAGNOSIS — E1169 Type 2 diabetes mellitus with other specified complication: Secondary | ICD-10-CM | POA: Diagnosis not present

## 2021-12-11 DIAGNOSIS — I2510 Atherosclerotic heart disease of native coronary artery without angina pectoris: Secondary | ICD-10-CM | POA: Diagnosis not present

## 2021-12-11 DIAGNOSIS — I5032 Chronic diastolic (congestive) heart failure: Secondary | ICD-10-CM | POA: Diagnosis not present

## 2021-12-11 DIAGNOSIS — E7800 Pure hypercholesterolemia, unspecified: Secondary | ICD-10-CM | POA: Diagnosis not present

## 2021-12-11 NOTE — Anesthesia Postprocedure Evaluation (Signed)
Anesthesia Post Note ? ?Patient: [name] ? ?Procedure(s) Performed: UPPER ENDOSCOPIC ULTRASOUND (EUS) RADIAL ?BIOPSY ?FINE NEEDLE ASPIRATION ? ?  ? ?Patient location during evaluation: Endoscopy ?Anesthesia Type: MAC ?Level of consciousness: awake and alert ?Pain management: pain level controlled ?Vital Signs Assessment: post-procedure vital signs reviewed and stable ?Respiratory status: spontaneous breathing ?Cardiovascular status: stable ?Anesthetic complications: no ? ? ?No notable events documented. ? ?Last Vitals:  ?Vitals:  ? 12/10/21 1610 12/10/21 1615  ?BP: 134/72   ?Pulse: 69 72  ?Resp: (!) 26 (!) 21  ?Temp:    ?SpO2: 98% 95%  ?  ?Last Pain:  ?Vitals:  ? 12/10/21 1544  ?TempSrc: Temporal  ?PainSc: 0-No pain  ? ? ?  ?  ?  ?  ?  ?  ? ?Nolon Nations ? ? ? ? ?

## 2021-12-12 ENCOUNTER — Encounter: Payer: Self-pay | Admitting: Gastroenterology

## 2021-12-12 LAB — SURGICAL PATHOLOGY

## 2021-12-12 LAB — CYTOLOGY - NON PAP

## 2021-12-18 ENCOUNTER — Telehealth: Payer: Self-pay | Admitting: Gastroenterology

## 2021-12-18 NOTE — Telephone Encounter (Signed)
Staff reminder sent to Patty, RN to set up MRI/MRCP in 6 months. Patient already has recall in for two month follow up.  ?

## 2021-12-18 NOTE — Telephone Encounter (Signed)
[facility] ? ?CEA 458 ng/mL ?Amylase 31,918 units/L ? ?Molecular testing is pending at this time. ? ?I called and spoke with the patient today about these results.  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 ? ?My thought is for [place] to await the final molecular testing but plan a 6-month MRI/MRCP to evaluate the cyst.  If all things are stable then would plan to move to yearly surveillance. ? ?I will plan to discuss her case at [facility] in the next few weeks as well. ? ?I will forward this information to the patient's primary gastroenterologist. ? ?Patient appreciative for the call back and happy at this point that no evidence of cancer was found but does understand the implications of an IPMN and its premalignant status. ? ? ?Corliss Parish, MD ?[facility] ?Advanced Endoscopy ?Office # [phone] ?

## 2021-12-27 DIAGNOSIS — K8689 Other specified diseases of pancreas: Secondary | ICD-10-CM | POA: Diagnosis not present

## 2021-12-27 DIAGNOSIS — K862 Cyst of pancreas: Secondary | ICD-10-CM | POA: Diagnosis not present

## 2021-12-29 DIAGNOSIS — J449 Chronic obstructive pulmonary disease, unspecified: Secondary | ICD-10-CM | POA: Diagnosis not present

## 2021-12-29 DIAGNOSIS — R069 Unspecified abnormalities of breathing: Secondary | ICD-10-CM | POA: Diagnosis not present

## 2021-12-29 DIAGNOSIS — J9601 Acute respiratory failure with hypoxia: Secondary | ICD-10-CM | POA: Diagnosis not present

## 2022-01-01 ENCOUNTER — Ambulatory Visit: Payer: Medicare Other | Admitting: Podiatry

## 2022-01-01 DIAGNOSIS — B351 Tinea unguium: Secondary | ICD-10-CM | POA: Diagnosis not present

## 2022-01-01 DIAGNOSIS — M79675 Pain in left toe(s): Secondary | ICD-10-CM | POA: Diagnosis not present

## 2022-01-01 DIAGNOSIS — M79674 Pain in right toe(s): Secondary | ICD-10-CM | POA: Diagnosis not present

## 2022-01-01 DIAGNOSIS — E1169 Type 2 diabetes mellitus with other specified complication: Secondary | ICD-10-CM | POA: Diagnosis not present

## 2022-01-02 ENCOUNTER — Encounter: Payer: Self-pay | Admitting: Gastroenterology

## 2022-01-02 NOTE — Progress Notes (Signed)
[facility] results ? ?These will be scanned to the system ? ?The molecular and clinical results showed: ?Evidence of acellular cytology ?Moderate quantity DNA and poor quality degraded DNA ?GNAS no mutation ?KRAS low clonality G12V ?KRAS high clonality G12D ? ?This places based on the Pancragen the patient to have a statistically higher risk cyst. ? ?Our plan was to discuss case at an upcoming [place] with a 28-monthfollow-up MRI/MRCP. ? ?No change in this particular plan. ? ?We will update the patient after the MLestervillediscussion in the coming weeks. ? ?These records were scanned into the patient's chart ? ? ?GJustice Britain MD ?[facility] ?Advanced Endoscopy ?Office # [phone]?

## 2022-01-03 NOTE — Progress Notes (Signed)
Subjective: ?71 year old female presents the office today for concerns of thick, elongated toenails that she cannot trim her self. They have grown back out casing pain.  Denies any drainage or pus.  No fevers or chills.  No other concerns.  ? ?Objective: ?AAO x3, NAD ?DP/PT pulses palpable bilaterally, CRT less than 3 seconds ?Nails are mildly hypertrophic, dystrophic with yellow discoloration causing ingrowing of the nails on both nail borders on multiple toenails.  There is no edema, erythema or any signs of infection.  She has tenderness nails 1 through 5 bilaterally. ?No open lesions or pre-ulcerative lesions.  ?No pain with calf compression, swelling, warmth, erythema ? ?Assessment: ?Symptomatic onychomycosis, ingrown toenails ? ?Plan: ?-All treatment options discussed with the patient including all alternatives, risks, complications.  ?-Sharply debrided the nails x 10 without any complications or bleeding. ?-Discussed daily foot inspection.  ?-Patient encouraged to call the office with any questions, concerns, change in symptoms.  ? ?Return in about 6 weeks- She wants to come in every 45 days.   ? ?Trula Slade DPM ? ?

## 2022-01-06 ENCOUNTER — Other Ambulatory Visit (HOSPITAL_COMMUNITY): Payer: Self-pay | Admitting: Nurse Practitioner

## 2022-01-14 DIAGNOSIS — J441 Chronic obstructive pulmonary disease with (acute) exacerbation: Secondary | ICD-10-CM | POA: Diagnosis not present

## 2022-01-14 DIAGNOSIS — E1169 Type 2 diabetes mellitus with other specified complication: Secondary | ICD-10-CM | POA: Diagnosis not present

## 2022-01-14 DIAGNOSIS — E7800 Pure hypercholesterolemia, unspecified: Secondary | ICD-10-CM | POA: Diagnosis not present

## 2022-01-14 DIAGNOSIS — I10 Essential (primary) hypertension: Secondary | ICD-10-CM | POA: Diagnosis not present

## 2022-01-14 DIAGNOSIS — I5030 Unspecified diastolic (congestive) heart failure: Secondary | ICD-10-CM | POA: Diagnosis not present

## 2022-01-15 ENCOUNTER — Ambulatory Visit: Payer: Medicare Other | Admitting: Physician Assistant

## 2022-01-15 ENCOUNTER — Ambulatory Visit
Admission: RE | Admit: 2022-01-15 | Discharge: 2022-01-15 | Disposition: A | Discharge status: Home / Self Care | Payer: Medicare Other | Source: Ambulatory Visit | Attending: Thoracic Surgery (Cardiothoracic Vascular Surgery) | Admitting: Thoracic Surgery (Cardiothoracic Vascular Surgery)

## 2022-01-15 VITALS — BP 140/80 | HR 83 | Resp 20 | Ht 59.0 in | Wt 206.0 lb

## 2022-01-15 DIAGNOSIS — I7123 Aneurysm of the descending thoracic aorta, without rupture: Secondary | ICD-10-CM | POA: Diagnosis not present

## 2022-01-15 DIAGNOSIS — I712 Thoracic aortic aneurysm, without rupture, unspecified: Secondary | ICD-10-CM

## 2022-01-15 MED ORDER — IOPAMIDOL (ISOVUE-370) INJECTION 76%
75.0000 mL | Freq: Once | INTRAVENOUS | Status: AC | PRN
  Administered 2022-01-15: 75 mL via INTRAVENOUS

## 2022-01-15 NOTE — Patient Instructions (Addendum)
Continue to monitor your blood pressure on a regular basis with a goal of BP less than 130/80.  Follow-up in 6 months with repeat chest CTA.

## 2022-01-15 NOTE — Progress Notes (Signed)
[address]       [place] [address]             [phone]    History of Present Illness:   Patient presents today for aortic aneurysm surveillance.    She is a 71 year old female with a past medical history of hypertension, hyperlipidemia and tobacco abuse as well as interstitial lung disease/COPD.  She requires supplemental oxygen chronically.  She also has a history of persistent atrial fibrillation, CAD, and associated chronic diastolic heart failure.  She was found to have a penetrating ulcer of the distal arch, descending thoracic and thoracoabdominal aneurysms on CT scan that was done initially for ILD.  She is being followed with CT scans on a 65-month basis.  On the last CT scan obtained in November 2022, the ascending aortic aneurysm measured 4.5 cm.  There was stable ulcerated plaque in the aortic arch described on the study.  Additionally, there is 4.6 cm maximal dilation of the distal thoracic and upper abdominal aorta.  In addition, she was noted to have a 3 cm cyst at the head of the pancreas.  She was referred to gastroenterology for further evaluation.  She underwent endoscopic ultrasound with biopsy of the head of the pancreas showing no malignancy.  She continues to be followed by gastroenterology.  She presents today for scheduled surveillance.  She reports no new problems or concerns.  She confirms that she keeps close eye on her blood pressure at home.  Current Outpatient Medications  Medication Sig Dispense Refill   ACCU-CHEK GUIDE test strip See admin instructions.     acetaminophen (TYLENOL) 500 MG tablet Take 500 mg by mouth every 6 (six) hours as needed for moderate pain.     apixaban (ELIQUIS) 5 MG TABS tablet Take 1 tablet (5 mg total) by mouth 2 (two) times daily. 56 tablet 0   ascorbic acid (VITAMIN C) 500 MG tablet Take 500 mg by mouth every other day.     atorvastatin (LIPITOR) 20 MG tablet Take 20 mg by mouth every evening.      brimonidine (ALPHAGAN) 0.2 % ophthalmic solution Place 1 drop into both eyes 2 (two) times daily.     calcium citrate-vitamin D (CITRACAL+D) 315-200 MG-UNIT tablet Take 1 tablet by mouth daily.     cholecalciferol (VITAMIN D3) 25 MCG (1000 UNIT) tablet Take 1,000 Units by mouth every other day.     diltiazem (CARDIZEM CD) 240 MG 24 hr capsule TAKE ONE CAPSULE BY MOUTH ONCE DAILY (Patient taking differently: Take 240 mg by mouth daily.) 90 capsule 3   dofetilide (TIKOSYN) 500 MCG capsule TAKE ONE CAPSULE BY MOUTH TWICE DAILY 60 capsule 6   furosemide (LASIX) 40 MG tablet Take 1 tablet (40 mg total) by mouth daily. MUST [place] FOLLOW UP APPT FOR FURTHER REFILLS 1ST ATTEMPT (Patient taking differently: Take 40 mg by mouth daily.) 30 tablet 0   GINKGO BILOBA COMPLEX PO Take 1 tablet by mouth every other day.     HYDROcodone-acetaminophen (NORCO/VICODIN) 5-325 MG tablet Take 1 tablet by mouth every 6 (six) hours as needed for moderate pain.     irbesartan (AVAPRO) 300 MG tablet Take 300 mg by mouth daily.     LORazepam (ATIVAN) 1 MG tablet Take 1 mg by mouth daily as needed for anxiety.  1   Multiple Vitamin (MULTIVITAMIN) capsule Take 1 capsule by mouth daily.     Multiple Vitamins-Minerals (HAIR SKIN NAILS PO) Take 1  tablet by mouth every other day.     omeprazole (PRILOSEC) 40 MG capsule Take 1 capsule (40 mg total) by mouth daily. 30 capsule 6   OXYGEN 2lpm 24/7 and 4 lpm with exertion AHC     Oxymetazoline HCl (SINEX ULTRA FINE MIST 12-HOUR NA) Place 1 spray into the nose daily as needed (congestion).     potassium chloride SA (KLOR-CON M) 20 MEQ tablet TAKE ONE TABLET BY MOUTH ONCE DAILY 90 tablet 3   ROCKLATAN 0.02-0.005 % SOLN Place 1 drop into both eyes at bedtime.     UNABLE TO FIND BIPAP with 2lpm o2 at night  AHC     vitamin B-12 (CYANOCOBALAMIN) 1000 MCG tablet Take 1,000 mcg by mouth every other day.     No current facility-administered medications for this visit.    Physical  Exam: Vital signs BP 140/80 Pulse 83 Respirations 20 SPO2 91%  General: No distress, steady gait. Heart: Irregularly irregular rhythm.  No murmur. Chest: Breath sounds are full, equal, and clear to auscultation. Extremities: Bilateral ankle edema, right greater than left.  She says this is chronic.  All extremities are well-perfused.    Diagnostic Tests: CLINICAL DATA:  Aortic aneurysm, known or suspected   EXAM: CT ANGIOGRAPHY CHEST WITH CONTRAST   TECHNIQUE: Multidetector CT imaging of the chest was performed using the standard protocol during bolus administration of intravenous contrast. Multiplanar CT image reconstructions and MIPs were obtained to evaluate the vascular anatomy.   RADIATION DOSE REDUCTION: This exam was performed according to the departmental dose-optimization program which includes automated exposure control, adjustment of the mA and/or kV according to patient size and/or use of iterative reconstruction technique.   Creatinine was obtained on site at [facility] at [address]   Results: Creatinine 1.1 mg/dL.   CONTRAST:  49mL ISOVUE-370 IOPAMIDOL (ISOVUE-370) INJECTION 76%   COMPARISON:  CT dated July 17, 2021, Jan 09, 2021   FINDINGS: Cardiovascular: Revisualization of aneurysmal dilation of the descending thoracic aorta at the level inferior to the LEFT main pulmonary artery. It measures approximately 4.8 cm, previously 4.6 cm in May 2022; there is mildly increased mass effect on the adjacent bronchus. Similar appearance of a penetrating atheromatous ulcer in the distal descending segment and 2 in the aortic arch. Similar aneurysmal dilation of the distal descending aorta to approximately 4.5 cm. Soft and calcified plaque throughout the course of the aorta. No large central pulmonary embolism. Heart is normal in size. Atherosclerotic calcifications of predominately the LEFT coronary arteries. No pericardial effusion.    Mediastinum/Nodes: Visualized thyroid is unremarkable. No axillary or mediastinal adenopathy.   Lungs/Pleura: Mild centrilobular emphysema. No pleural effusion or pneumothorax. Subpleural reticulation and geographic ground-glass opacities are similar comparison to prior and likely reflect a degree of underlying interstitial lung disease.   Upper Abdomen: No acute abnormality.   Musculoskeletal: Degenerative changes of the thoracic spine.   Review of the MIP images confirms the above findings.   IMPRESSION: 1. Indolent increase in size of the descending thoracic aortic aneurysm to approximately 4.8 cm, previously 4.6 cm. There is mildly increased mass effect on the adjacent LEFT mainstem bronchus. Recommend semi-annual imaging followup by CTA or MRA and referral to cardiothoracic surgery if not already obtained. This recommendation follows 2010 ACCF/AHA/AATS/ACR/ASA/SCA/SCAI/SIR/STS/SVM Guidelines for the Diagnosis and Management of Patients With Thoracic Aortic Disease. Circulation. 2010; 121: V425-Z56. Aortic aneurysm NOS (ICD10-I71.9) This follows ACR consensus guidelines: Managing Incidental Findings on Thoracic CT: Mediastinal and Cardiovascular Findings. J  Am Coll Radiol 2018;15:1087-1096. 2. Similar appearance of additional aneurysmal dilation of the distal aspect of the descending thoracic aorta as well as multiple penetrating atheromatous ulcers.   Aortic Atherosclerosis (ICD10-I70.0) and Emphysema (ICD10-J43.9).     Electronically Signed   By: Valentino Saxon M.D.   On: 01/15/2022 12:17    Impression / Plan: Slight increase in maximal dimension of the ascending aortic aneurysm from 4.5 to 4.8 cm over the past 6 months.  The ulcerated plaque previously prescription described is unchanged.  She remains asymptomatic.  Recommend repeat CTA in 6 months per guidelines and continued careful control of blood pressure.  I spent over 20 minutes in clinical data and  counseling Ms. Zenk face-to-face during this encounter.  Antony Odea, PA-C [facility] [phone]

## 2022-01-16 ENCOUNTER — Ambulatory Visit: Payer: Medicare Other | Admitting: Gastroenterology

## 2022-01-16 ENCOUNTER — Encounter: Payer: Self-pay | Admitting: Gastroenterology

## 2022-01-16 VITALS — BP 130/70 | HR 64 | Ht 59.0 in | Wt 208.8 lb

## 2022-01-16 DIAGNOSIS — K862 Cyst of pancreas: Secondary | ICD-10-CM

## 2022-01-16 DIAGNOSIS — K31A0 Gastric intestinal metaplasia, unspecified: Secondary | ICD-10-CM

## 2022-01-16 NOTE — Progress Notes (Signed)
HPI : [name] is a very pleasant 71 year old female with coronary artery disease, chronic diastolic heart failure, oxygen dependent emphysema/chronic respiratory failure, atrial fibrillation and aortic aneurysm who I initially saw in January 2023 for incidentally noted pancreatic cyst.  The cyst was noted on surveillance CT of her aneurysm.  It was apparently visible on the CT in November 2020, but was not in the report.  The cyst had increased in size from 2 x 1.2 cm in 2020 to 3 x 1.7 cm in December 2022.  An MRCP was obtained which showed a septated cystic lesion 4.2 by 2.8 by 1.7 cm without any obvious connection to the duct and without any solid features.  Lipase was slightly elevated, liver enzymes normal and CA 19-9 was not elevated.  Dr. Rush Landmark performed an EUS with FNA and no malignant cells were seen.  Genetic testing of fluid was as follows: [facility] Pancragen results The molecular and clinical results showed: Evidence of acellular cytology Moderate quantity DNA and poor quality degraded DNA GNAS no mutation KRAS low clonality G12V KRAS high clonality G12D This places based on the Pancragen the patient to have a statistically higher risk cyst.  Her EGD was notable for inflamed appearing gastric mucosa and gastric biopsies were taken and showed gastric intestinal metaplasia.  The patient's case is scheduled to be discussed in multi-disciplinary conference on June 7.  Today, the patient reports she is at her baseline.  She denies any significant GI symptoms.  Occasionally she will have some upper abdominal discomfort, but in general abdominal pain is not a major problem for her.  Her appetite is good and her weight is stable.  She has regular bowel movements, sometimes missing a day.  No blood in the stool.  The patient has no family history of stomach cancer.  She does have a long smoking history, but stopped in 2012 after a 43-pack-year history.   Past Medical History:   Diagnosis Date   Chronic diastolic CHF (congestive heart failure) (HCC)    Chronic respiratory failure (HCC)    Coronary atherosclerosis    a. 2V by CT 10/2017.   Emphysema of lung ([place])    Hypercholesterolemia    Hypertension    NSIP (nonspecific interstitial pneumonia) (HCC)    Osteoarthritis    Persistent atrial fibrillation ([place])    Thoracic aortic ectasia [facility])      Past Surgical History:  Procedure Laterality Date   BIOPSY  12/10/2021   Procedure: BIOPSY;  Surgeon: Rush Landmark Telford Nab., MD;  Location: [facility];  Service: Gastroenterology;;   CARDIOVERSION N/A 07/25/2016   Procedure: CARDIOVERSION;  Surgeon: Fay Records, MD;  Location: [facility];  Service: Cardiovascular;  Laterality: N/A;   CARDIOVERSION N/A 09/14/2016   Procedure: CARDIOVERSION;  Surgeon: Thompson Grayer, MD;  Location: [place];  Service: Cardiovascular;  Laterality: N/A;   CATARACT EXTRACTION Bilateral    ESOPHAGOGASTRODUODENOSCOPY (EGD) WITH PROPOFOL N/A 12/10/2021   Procedure: ESOPHAGOGASTRODUODENOSCOPY (EGD) WITH PROPOFOL;  Surgeon: Irving Copas., MD;  Location: [facility];  Service: Gastroenterology;  Laterality: N/A;   EUS N/A 12/10/2021   Procedure: UPPER ENDOSCOPIC ULTRASOUND (EUS) RADIAL;  Surgeon: Irving Copas., MD;  Location: [facility];  Service: Gastroenterology;  Laterality: N/A;   FINE NEEDLE ASPIRATION  12/10/2021   Procedure: FINE NEEDLE ASPIRATION;  Surgeon: Rush Landmark Telford Nab., MD;  Location: [facility];  Service: Gastroenterology;;   LEG SURGERY Right    TEE WITHOUT CARDIOVERSION N/A 07/25/2016   Procedure: TRANSESOPHAGEAL ECHOCARDIOGRAM (TEE);  Surgeon: Fay Records, MD;  Location: [facility] ENDOSCOPY;  Service: Cardiovascular;  Laterality: N/A;   Family History  Problem Relation Age of Onset   Heart disease Father    Heart attack Sister    Stroke Neg Hx    Social History   Tobacco Use   Smoking status: Former    Packs/day: 1.00    Years: 43.00    Pack  years: 43.00    Types: Cigarettes    Start date: 08/26/1966    Quit date: 08/26/2010    Years since quitting: 11.4   Smokeless tobacco: Never  Vaping Use   Vaping Use: Never used  Substance Use Topics   Alcohol use: No    Alcohol/week: 0.0 standard drinks   Drug use: No   Current Outpatient Medications  Medication Sig Dispense Refill   ACCU-CHEK GUIDE test strip See admin instructions.     acetaminophen (TYLENOL) 500 MG tablet Take 500 mg by mouth every 6 (six) hours as needed for moderate pain.     apixaban (ELIQUIS) 5 MG TABS tablet Take 1 tablet (5 mg total) by mouth 2 (two) times daily. 56 tablet 0   ascorbic acid (VITAMIN C) 500 MG tablet Take 500 mg by mouth every other day.     atorvastatin (LIPITOR) 20 MG tablet Take 20 mg by mouth every evening.     brimonidine (ALPHAGAN) 0.2 % ophthalmic solution Place 1 drop into both eyes 2 (two) times daily.     calcium citrate-vitamin D (CITRACAL+D) 315-200 MG-UNIT tablet Take 1 tablet by mouth daily.     cholecalciferol (VITAMIN D3) 25 MCG (1000 UNIT) tablet Take 1,000 Units by mouth every other day.     diltiazem (CARDIZEM CD) 240 MG 24 hr capsule TAKE ONE CAPSULE BY MOUTH ONCE DAILY (Patient taking differently: Take 240 mg by mouth daily.) 90 capsule 3   dofetilide (TIKOSYN) 500 MCG capsule TAKE ONE CAPSULE BY MOUTH TWICE DAILY 60 capsule 6   furosemide (LASIX) 40 MG tablet Take 1 tablet (40 mg total) by mouth daily. MUST [place] FOLLOW UP APPT FOR FURTHER REFILLS 1ST ATTEMPT (Patient taking differently: Take 40 mg by mouth daily.) 30 tablet 0   GINKGO BILOBA COMPLEX PO Take 1 tablet by mouth every other day.     HYDROcodone-acetaminophen (NORCO/VICODIN) 5-325 MG tablet Take 1 tablet by mouth every 6 (six) hours as needed for moderate pain.     irbesartan (AVAPRO) 300 MG tablet Take 300 mg by mouth daily.     LORazepam (ATIVAN) 1 MG tablet Take 1 mg by mouth daily as needed for anxiety.  1   Multiple Vitamin (MULTIVITAMIN) capsule Take 1  capsule by mouth daily.     Multiple Vitamins-Minerals (HAIR SKIN NAILS PO) Take 1 tablet by mouth every other day.     omeprazole (PRILOSEC) 40 MG capsule Take 1 capsule (40 mg total) by mouth daily. 30 capsule 6   OXYGEN 2lpm 24/7 and 4 lpm with exertion AHC     Oxymetazoline HCl (SINEX ULTRA FINE MIST 12-HOUR NA) Place 1 spray into the nose daily as needed (congestion).     potassium chloride SA (KLOR-CON M) 20 MEQ tablet TAKE ONE TABLET BY MOUTH ONCE DAILY 90 tablet 3   ROCKLATAN 0.02-0.005 % SOLN Place 1 drop into both eyes at bedtime.     UNABLE TO FIND BIPAP with 2lpm o2 at night  AHC     vitamin B-12 (CYANOCOBALAMIN) 1000 MCG tablet Take 1,000 mcg by mouth every  other day.     No current facility-administered medications for this visit.   Allergies  Allergen Reactions   Oxycodone Hcl Itching   Levaquin [Levofloxacin In D5w] Itching and Rash   Percocet [Oxycodone-Acetaminophen] Itching     Review of Systems: All systems reviewed and negative except where noted in HPI.    CT ANGIO CHEST AORTA W/CM & OR WO/CM  Result Date: 01/15/2022 CLINICAL DATA:  Aortic aneurysm, known or suspected EXAM: CT ANGIOGRAPHY CHEST WITH CONTRAST TECHNIQUE: Multidetector CT imaging of the chest was performed using the standard protocol during bolus administration of intravenous contrast. Multiplanar CT image reconstructions and MIPs were obtained to evaluate the vascular anatomy. RADIATION DOSE REDUCTION: This exam was performed according to the departmental dose-optimization program which includes automated exposure control, adjustment of the mA and/or kV according to patient size and/or use of iterative reconstruction technique. Creatinine was obtained on site at [facility] at [address] Results: Creatinine 1.1 mg/dL. CONTRAST:  69m ISOVUE-370 IOPAMIDOL (ISOVUE-370) INJECTION 76% COMPARISON:  CT dated July 17, 2021, Jan 09, 2021 FINDINGS: Cardiovascular: Revisualization of  aneurysmal dilation of the descending thoracic aorta at the level inferior to the LEFT main pulmonary artery. It measures approximately 4.8 cm, previously 4.6 cm in May 2022; there is mildly increased mass effect on the adjacent bronchus. Similar appearance of a penetrating atheromatous ulcer in the distal descending segment and 2 in the aortic arch. Similar aneurysmal dilation of the distal descending aorta to approximately 4.5 cm. Soft and calcified plaque throughout the course of the aorta. No large central pulmonary embolism. Heart is normal in size. Atherosclerotic calcifications of predominately the LEFT coronary arteries. No pericardial effusion. Mediastinum/Nodes: Visualized thyroid is unremarkable. No axillary or mediastinal adenopathy. Lungs/Pleura: Mild centrilobular emphysema. No pleural effusion or pneumothorax. Subpleural reticulation and geographic ground-glass opacities are similar comparison to prior and likely reflect a degree of underlying interstitial lung disease. Upper Abdomen: No acute abnormality. Musculoskeletal: Degenerative changes of the thoracic spine. Review of the MIP images confirms the above findings. IMPRESSION: 1. Indolent increase in size of the descending thoracic aortic aneurysm to approximately 4.8 cm, previously 4.6 cm. There is mildly increased mass effect on the adjacent LEFT mainstem bronchus. Recommend semi-annual imaging followup by CTA or MRA and referral to cardiothoracic surgery if not already obtained. This recommendation follows 2010 ACCF/AHA/AATS/ACR/ASA/SCA/SCAI/SIR/STS/SVM Guidelines for the Diagnosis and Management of Patients With Thoracic Aortic Disease. Circulation. 2010; 121:: [id number] Aortic aneurysm NOS (ICD10-I71.9) This follows ACR consensus guidelines: Managing Incidental Findings on Thoracic CT: Mediastinal and Cardiovascular Findings. J Am Coll Radiol 2018;15:1087-1096. 2. Similar appearance of additional aneurysmal dilation of the distal aspect of the  descending thoracic aorta as well as multiple penetrating atheromatous ulcers. Aortic Atherosclerosis (ICD10-I70.0) and Emphysema (ICD10-J43.9). Electronically Signed   By: SValentino SaxonM.D.   On: 01/15/2022 12:17    Physical Exam: BP 130/70   Pulse 64   Ht 4' 11"  (1.499 m)   Wt 208 lb 12.8 oz (94.7 kg)   SpO2 91%   BMI 42.17 kg/m  Constitutional: Pleasant,well-developed, Caucasian female in no acute distress.  Accompanied by her son HEENT: Normocephalic and atraumatic. Conjunctivae are normal. No scleral icterus. Neck supple.  Cardiovascular: Normal rate, regular rhythm.  Pulmonary/chest: Effort normal and breath sounds normal on supplemental oxygen via nasal cannula. No wheezing, rales or rhonchi. Abdominal: Soft, nondistended, nontender. Bowel sounds active throughout. There are no masses palpable. No hepatomegaly. Extremities: Trace bilateral lower extremity pretibial edema Neurological: Alert and oriented  to person place and time. Skin: Skin is warm and dry. No rashes noted. Psychiatric: Normal mood and affect. Behavior is normal.  CBC    Component Value Date/Time   WBC 9.6 03/14/2020 1530   RBC 5.06 03/14/2020 1530   HGB 15.5 (H) 03/14/2020 1530   HGB 14.4 07/30/2018 1115   HCT 46.7 (H) 03/14/2020 1530   HCT 44.7 07/30/2018 1115   PLT 188 03/14/2020 1530   PLT 198 07/30/2018 1115   MCV 92.3 03/14/2020 1530   MCV 89 07/30/2018 1115   MCH 30.6 03/14/2020 1530   MCHC 33.2 03/14/2020 1530   RDW 14.0 03/14/2020 1530   RDW 13.9 07/30/2018 1115   LYMPHSABS 1.3 10/29/2016 1205   MONOABS 1.1 (H) 10/29/2016 1205   EOSABS 0.2 10/29/2016 1205   BASOSABS 0.1 10/29/2016 1205    CMP     Component Value Date/Time   NA 140 08/31/2021 1030   NA 139 10/21/2018 1003   K 4.2 08/31/2021 1030   CL 103 08/31/2021 1030   CO2 29 08/31/2021 1030   GLUCOSE 102 (H) 08/31/2021 1030   BUN 17 08/31/2021 1030   BUN 16 10/21/2018 1003   CREATININE 0.93 08/31/2021 1030   CALCIUM 9.9  08/31/2021 1030   PROT 7.9 09/12/2021 0940   ALBUMIN 4.4 09/12/2021 0940   AST 18 09/12/2021 0940   ALT 15 09/12/2021 0940   ALKPHOS 93 09/12/2021 0940   BILITOT 0.5 09/12/2021 0940   GFRNONAA >60 08/31/2021 1030   GFRAA >60 03/14/2020 1530     ASSESSMENT AND PLAN: 71 year old female with multiple cardio pulmonary comorbidities as mentioned above, with incidentally noted large pancreatic cyst, most consistent with IPMN.  Based on the size and cytogenetic profile, the cyst is higher risk.  The patient has significant cardiopulmonary comorbidities and would be very high risk from a surgical standpoint.  Her case is scheduled to be discussed at multidisciplinary conference on June 7.  We will contact her following that conference to discuss the recommendations, whether it be image surveillance, EUS surveillance or surgery. We discussed gastric intestinal metaplasia and his potential role as a precursor to stomach cancer.  The patient has an extensive smoking history, but no other risk factors for stomach cancer.  We discussed the rationale for endoscopic surveillance, and appropriate patient selection.  I think given her significant comorbidities, the risks of endoscopic surveillance would outweigh the benefits.  Her pancreatic cyst is a much more concerning lesion than her gastrointestinal metaplasia.  If endoscopic surveillance of her cyst is performed, then I would recommend performing surveillance biopsies for GIM at that time.  Otherwise, I would advise against endoscopic surveillance of GIM.  Patient indicated understanding and agreement with this decision.  Pancreatic cyst, IPMN - Case to be discussed at [facility] June 7  Gastric intestinal metaplasia, history of tobacco use, otherwise no risk factors for stomach cancer - Recommend against routine endoscopic surveillance  Talya Quain E. Candis Schatz, MD [facility]   Shirline Frees, MD   Her case will be discussed at  multidisciplinary conference on June 7

## 2022-01-16 NOTE — Patient Instructions (Addendum)
If you are age 72 or older, your body mass index should be between 23-30. Your Body mass index is 42.17 kg/m. If this is out of the aforementioned range listed, please consider follow up with your Primary Care Provider.  If you are age 17 or younger, your body mass index should be between 19-25. Your Body mass index is 42.17 kg/m. If this is out of the aformentioned range listed, please consider follow up with your Primary Care Provider.    The [facility] providers would like to encourage you to use [facility] to communicate with providers for non-urgent requests or questions.  Due to long hold times on the telephone, sending your provider a message by [facility] may be a faster and more efficient way to get a response.  Please allow 48 business hours for a response.  Please remember that this is for non-urgent requests.   It was a pleasure to see you today!  Thank you for trusting me with your gastrointestinal care!    Scott E.Tomasa Rand, MD

## 2022-01-29 DIAGNOSIS — R069 Unspecified abnormalities of breathing: Secondary | ICD-10-CM | POA: Diagnosis not present

## 2022-01-29 DIAGNOSIS — J449 Chronic obstructive pulmonary disease, unspecified: Secondary | ICD-10-CM | POA: Diagnosis not present

## 2022-01-29 DIAGNOSIS — J9601 Acute respiratory failure with hypoxia: Secondary | ICD-10-CM | POA: Diagnosis not present

## 2022-01-30 ENCOUNTER — Other Ambulatory Visit: Payer: Self-pay

## 2022-01-30 ENCOUNTER — Telehealth: Payer: Self-pay | Admitting: Gastroenterology

## 2022-01-30 NOTE — Telephone Encounter (Signed)
[name], Please have Dr. Milas Hock nurse work on trying to obtain the colonoscopy report from [facility] for his review.  Also have a MRI/MRCP 12-month follow-up from EUS.  That can be put under Dr. Milas Hock name and he and I can discuss the findings from there to determine the follow-up.  Thanks to all.   Please send this updated messaging to the patient primary care provider.  GM

## 2022-01-30 NOTE — Progress Notes (Signed)
The proposed treatment discussed in conference is for discussion purpose only and is not a binding recommendation.  The patients have not been physically examined, or presented with their treatment options.  Therefore, final treatment plans cannot be decided.  

## 2022-01-30 NOTE — Telephone Encounter (Signed)
Case was discussed at [facility] today. Radiology was reviewed showing that the pancreatic cystic lesion has been present going back to 2020 and some cuts of her CTs of the chest. EUS was reviewed. 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  The KRAS mutation analysis suggested a possibility for a higher risk cyst long-term. After discussion with our hepatobiliary surgeons and the rest of the team, it was felt that close surveillance is warranted. We will plan a 16-monthMRI/MRCP.  If overall stability is found, then we will planned a 1 year follow-up thereafter. I have called and discussed this with the patient today and she agrees with this plan of action.  The patient has a history of colon polyps and feels that she is had at least 5-6 polyps removed but has been greater than 5 to 7 years and she has not had a follow-up.  [facility] As we feel the patient is a candidate for surveillance of this pancreas lesion, 1 must also consider whether she remains a colon cancer screening/colon polyp surveillance candidate. I will forward this information to the patient's primary gastroenterologist, Dr. CCandis Schatzso that he can make a decision as to what follow-up procedures may be indicated if any from a colon standpoint.  Patty, Please have Dr. CDayle Pointsnurse work on trying [place] [facility] [facility] [place].  Also have a MRI/MRCP 682-monthollow-up from EUS.  That can be put under Dr. CuDayle Pointsame and he and I can discuss the findings from there to determine the follow-up.  Thanks to all.  Please send this updated messaging to the patient primary care provider.  GM

## 2022-02-08 DIAGNOSIS — E7800 Pure hypercholesterolemia, unspecified: Secondary | ICD-10-CM | POA: Diagnosis not present

## 2022-02-08 DIAGNOSIS — I10 Essential (primary) hypertension: Secondary | ICD-10-CM | POA: Diagnosis not present

## 2022-02-08 DIAGNOSIS — E1169 Type 2 diabetes mellitus with other specified complication: Secondary | ICD-10-CM | POA: Diagnosis not present

## 2022-02-08 DIAGNOSIS — J9611 Chronic respiratory failure with hypoxia: Secondary | ICD-10-CM | POA: Diagnosis not present

## 2022-02-08 DIAGNOSIS — N183 Chronic kidney disease, stage 3 unspecified: Secondary | ICD-10-CM | POA: Diagnosis not present

## 2022-02-08 DIAGNOSIS — I5030 Unspecified diastolic (congestive) heart failure: Secondary | ICD-10-CM | POA: Diagnosis not present

## 2022-02-12 ENCOUNTER — Ambulatory Visit (INDEPENDENT_AMBULATORY_CARE_PROVIDER_SITE_OTHER): Payer: Medicare Other | Admitting: Podiatry

## 2022-02-12 DIAGNOSIS — M79674 Pain in right toe(s): Secondary | ICD-10-CM | POA: Diagnosis not present

## 2022-02-12 DIAGNOSIS — M79675 Pain in left toe(s): Secondary | ICD-10-CM

## 2022-02-12 DIAGNOSIS — B351 Tinea unguium: Secondary | ICD-10-CM | POA: Diagnosis not present

## 2022-02-12 DIAGNOSIS — E1169 Type 2 diabetes mellitus with other specified complication: Secondary | ICD-10-CM | POA: Diagnosis not present

## 2022-02-12 DIAGNOSIS — Z7901 Long term (current) use of anticoagulants: Secondary | ICD-10-CM | POA: Diagnosis not present

## 2022-02-12 NOTE — Telephone Encounter (Signed)
Left message for HIM dept to call back regarding obtaining Colon report from [facility].

## 2022-02-13 ENCOUNTER — Other Ambulatory Visit (HOSPITAL_COMMUNITY): Payer: Self-pay

## 2022-02-13 MED ORDER — APIXABAN 5 MG PO TABS: 5.0000 mg | ORAL_TABLET | Freq: Two times a day (BID) | ORAL | 0 refills | Status: DC

## 2022-02-14 ENCOUNTER — Ambulatory Visit (HOSPITAL_COMMUNITY)
Admission: RE | Admit: 2022-02-14 | Discharge: 2022-02-14 | Disposition: A | Discharge status: Home / Self Care | Payer: Medicare Other | Source: Ambulatory Visit | Attending: Nurse Practitioner | Admitting: Nurse Practitioner

## 2022-02-14 ENCOUNTER — Encounter (HOSPITAL_COMMUNITY): Payer: Self-pay | Admitting: Nurse Practitioner

## 2022-02-14 VITALS — BP 130/76 | HR 66 | Ht 59.0 in | Wt 209.4 lb

## 2022-02-14 DIAGNOSIS — D6869 Other thrombophilia: Secondary | ICD-10-CM

## 2022-02-14 DIAGNOSIS — Z7901 Long term (current) use of anticoagulants: Secondary | ICD-10-CM | POA: Insufficient documentation

## 2022-02-14 DIAGNOSIS — J8410 Pulmonary fibrosis, unspecified: Secondary | ICD-10-CM | POA: Diagnosis not present

## 2022-02-14 DIAGNOSIS — Z9981 Dependence on supplemental oxygen: Secondary | ICD-10-CM | POA: Insufficient documentation

## 2022-02-14 DIAGNOSIS — I4819 Other persistent atrial fibrillation: Secondary | ICD-10-CM | POA: Insufficient documentation

## 2022-02-14 DIAGNOSIS — I2510 Atherosclerotic heart disease of native coronary artery without angina pectoris: Secondary | ICD-10-CM | POA: Insufficient documentation

## 2022-02-14 DIAGNOSIS — I110 Hypertensive heart disease with heart failure: Secondary | ICD-10-CM | POA: Diagnosis not present

## 2022-02-14 DIAGNOSIS — I5032 Chronic diastolic (congestive) heart failure: Secondary | ICD-10-CM | POA: Insufficient documentation

## 2022-02-14 DIAGNOSIS — Z79899 Other long term (current) drug therapy: Secondary | ICD-10-CM | POA: Insufficient documentation

## 2022-02-14 DIAGNOSIS — E119 Type 2 diabetes mellitus without complications: Secondary | ICD-10-CM | POA: Diagnosis not present

## 2022-02-14 DIAGNOSIS — Z87891 Personal history of nicotine dependence: Secondary | ICD-10-CM | POA: Diagnosis not present

## 2022-02-14 DIAGNOSIS — Z5181 Encounter for therapeutic drug level monitoring: Secondary | ICD-10-CM | POA: Insufficient documentation

## 2022-02-14 LAB — BASIC METABOLIC PANEL
Anion gap: 7 (ref 5–15)
BUN: 13 mg/dL (ref 8–23)
CO2: 27 mmol/L (ref 22–32)
Calcium: 9.8 mg/dL (ref 8.9–10.3)
Chloride: 108 mmol/L (ref 98–111)
Creatinine, Ser: 0.92 mg/dL (ref 0.44–1.00)
GFR, Estimated: >60 mL/min (ref >60)
Glucose, Bld: 124 mg/dL — ABNORMAL HIGH (ref 70–99)
Potassium: 5.2 mmol/L — ABNORMAL HIGH (ref 3.5–5.1)
Sodium: 142 mmol/L (ref 135–145)

## 2022-02-14 LAB — MAGNESIUM: Magnesium: 2.2 mg/dL (ref 1.7–2.4)

## 2022-02-14 NOTE — Progress Notes (Signed)
Date:  02/14/2022   ID:  SEASON GOLDTOOTH, DOB March 29, 1951, MRN [id number]  Location:  In office  Provider location: [address] [place], [place] [address] Evaluation Performed:  Follow up   PCP:  Shirline Frees, MD  Primary Cardiologist:  [name] Primary Electrophysiologist: Dr. Rayann Heman   CC: afib/tikosyn surveillance   History of Present Illness: [name] is a 71 y.o. female who presents for a f/u tikosyn  visit today.   Pt reports that she is doing well with only 2 short afib episodes last month. otherwise staying in SR, and remains compliant with Tikosyn and  Eliquis. CHA2DS2VASc Score of 3. No bleeding issues. She is feeling more short of breath, h/o pulmonary fibrosis and she is wanting to move up her appointment with him,continues on O2 via nasal cannula.    F/u in afib clinic, 08/16/21 for Tikosyn surveillance. She  reports no afib. She is being compliant with Tikosyn and eliquis. Qt stable.   F/u in afib clinic for Tikosyn surveillance, 02/14/22. She has noted her fit bit at night occasionally will show irregular rhythm but HR's are not elevated and she does not feel any different. Her watch does not have capacity to run ekg's, so I am deducting this may be palpitations but no evidence for afib. Otherwise, no complaints today. She states her lungs are at baseline. Qt stable.   Today, she denies symptoms of palpitations, chest pain, orthopnea, PND, lower extremity edema, claudication, dizziness, presyncope, syncope, bleeding, or neurologic sequela.+shortness of breath. The patient is tolerating medications without difficulties and is otherwise without complaint today.   she denies symptoms of cough, fevers, chills, or new SOB worrisome for COVID 19.    Atrial Fibrillation Risk Factors:  she does have symptoms or diagnosis of sleep apnea. she is compliant with BiPAP therapy. she does not have a history of rheumatic fever. she does not have a history of  alcohol use. The patient does not have a history of early familial atrial fibrillation or other arrhythmias.  she has a BMI of Body mass index is 42.29 kg/m.Marland Kitchen Filed Weights   02/14/22 0957  Weight: 95 kg    Past Medical History:  Diagnosis Date   Chronic diastolic CHF (congestive heart failure) (HCC)    Chronic respiratory failure (HCC)    Coronary atherosclerosis    a. 2V by CT 10/2017.   Emphysema of lung ([place])    Hypercholesterolemia    Hypertension    NSIP (nonspecific interstitial pneumonia) (HCC)    Osteoarthritis    Persistent atrial fibrillation ([place])    Thoracic aortic ectasia [facility])    Past Surgical History:  Procedure Laterality Date   BIOPSY  12/10/2021   Procedure: BIOPSY;  Surgeon: Rush Landmark Telford Nab., MD;  Location: [facility];  Service: Gastroenterology;;   CARDIOVERSION N/A 07/25/2016   Procedure: CARDIOVERSION;  Surgeon: Fay Records, MD;  Location: [place];  Service: Cardiovascular;  Laterality: N/A;   CARDIOVERSION N/A 09/14/2016   Procedure: CARDIOVERSION;  Surgeon: Thompson Grayer, MD;  Location: [place];  Service: Cardiovascular;  Laterality: N/A;   CATARACT EXTRACTION Bilateral    ESOPHAGOGASTRODUODENOSCOPY (EGD) WITH PROPOFOL N/A 12/10/2021   Procedure: ESOPHAGOGASTRODUODENOSCOPY (EGD) WITH PROPOFOL;  Surgeon: Irving Copas., MD;  [facility];  Service: Gastroenterology;  Laterality: N/A;   EUS N/A 12/10/2021   Procedure: UPPER ENDOSCOPIC ULTRASOUND (EUS) RADIAL;  Surgeon: Irving Copas., MD;  [facility];  Service: Gastroenterology;  Laterality:  N/A;   FINE NEEDLE ASPIRATION  12/10/2021   Procedure: FINE NEEDLE ASPIRATION;  Surgeon: Meridee Score Netty Starring., MD;  Location: [facility];  Service: Gastroenterology;;   LEG SURGERY Right    TEE WITHOUT CARDIOVERSION N/A 07/25/2016   Procedure: TRANSESOPHAGEAL ECHOCARDIOGRAM (TEE);  Surgeon: Pricilla Riffle, MD;  Location[facility]COPY;  Service: Cardiovascular;   Laterality: N/A;     Current Outpatient Medications  Medication Sig Dispense Refill   ACCU-CHEK GUIDE test strip See admin instructions.     acetaminophen (TYLENOL) 500 MG tablet Take 500 mg by mouth every 6 (six) hours as needed for moderate pain.     apixaban (ELIQUIS) 5 MG TABS tablet Take 1 tablet (5 mg total) by mouth 2 (two) times daily. 56 tablet 0   ascorbic acid (VITAMIN C) 500 MG tablet Take 500 mg by mouth every other day.     atorvastatin (LIPITOR) 20 MG tablet Take 20 mg by mouth every evening.     brimonidine (ALPHAGAN) 0.2 % ophthalmic solution Place 1 drop into both eyes 2 (two) times daily.     calcium citrate-vitamin D (CITRACAL+D) 315-200 MG-UNIT tablet Take 1 tablet by mouth daily.     cholecalciferol (VITAMIN D3) 25 MCG (1000 UNIT) tablet Take 1,000 Units by mouth every other day.     diltiazem (CARDIZEM CD) 240 MG 24 hr capsule TAKE ONE CAPSULE BY MOUTH ONCE DAILY (Patient taking differently: Take 240 mg by mouth daily.) 90 capsule 3   dofetilide (TIKOSYN) 500 MCG capsule TAKE ONE CAPSULE BY MOUTH TWICE DAILY 60 capsule 6   furosemide (LASIX) 40 MG tablet Take 1 tablet (40 mg total) by mouth daily. MUST SCH FOLLOW UP APPT FOR FURTHER REFILLS 1ST ATTEMPT (Patient taking differently: Take 40 mg by mouth daily.) 30 tablet 0   GINKGO BILOBA COMPLEX PO Take 1 tablet by mouth every other day.     HYDROcodone-acetaminophen (NORCO/VICODIN) 5-325 MG tablet Take 1 tablet by mouth every 6 (six) hours as needed for moderate pain.     irbesartan (AVAPRO) 300 MG tablet Take 300 mg by mouth daily.     LORazepam (ATIVAN) 1 MG tablet Take 1 mg by mouth daily as needed for anxiety.  1   Multiple Vitamin (MULTIVITAMIN) capsule Take 1 capsule by mouth daily.     Multiple Vitamins-Minerals (HAIR SKIN NAILS PO) Take 1 tablet by mouth every other day.     omeprazole (PRILOSEC) 40 MG capsule Take 1 capsule (40 mg total) by mouth daily. 30 capsule 6   OXYGEN 2lpm 24/7 and 4 lpm with  exertion AHC     Oxymetazoline HCl (SINEX ULTRA FINE MIST 12-HOUR NA) Place 1 spray into the nose daily as needed (congestion).     potassium chloride SA (KLOR-CON M) 20 MEQ tablet TAKE ONE TABLET BY MOUTH ONCE DAILY 90 tablet 3   ROCKLATAN 0.02-0.005 % SOLN Place 1 drop into both eyes at bedtime.     UNABLE TO FIND BIPAP with 2lpm o2 at night  AHC     vitamin B-12 (CYANOCOBALAMIN) 1000 MCG tablet Take 1,000 mcg by mouth every other day.     No current facility-administered medications for this encounter.    Allergies:   Oxycodone hcl, Levaquin [levofloxacin in d5w], and Percocet [oxycodone-acetaminophen]   Social History:  The patient  reports that she quit smoking about 11 years ago. Her smoking use included cigarettes. She started smoking about 55 years ago. She has a 43.00 pack-year smoking history. She has never  used smokeless tobacco. She reports that she does not drink alcohol and does not use drugs.   Family History:  The patient's  family history includes Heart attack in her sister; Heart disease in her father.    ROS:  Please see the history of present illness.   All other systems are personally reviewed and negative.   Exam: GEN- The patient is well appearing, alert and oriented x 3 today.   Head- normocephalic, atraumatic Eyes-  Sclera clear, conjunctiva pink Ears- hearing intact Oropharynx- clear Neck- supple, no JVP Lymph- no cervical lymphadenopathy Lungs- Clear to ausculation bilaterally, normal work of breathing Heart- Regular rate and rhythm, no murmurs, rubs or gallops, PMI not laterally displaced GI- soft, NT, ND, + BS Extremities- no clubbing, cyanosis, or 1t pitting edema( improved) MS- no significant deformity or atrophy Skin- no rash or lesion Psych- euthymic mood, full affect Neuro- strength and sensation are intact       Recent Labs: 08/16/2021: Magnesium 2.2 08/31/2021: BUN 17; Creatinine, Ser 0.93; Potassium 4.2; Sodium 140 09/12/2021: ALT 15   personally reviewed    Other studies personally reviewed:   Ekg-Vent. rate 66 BPM PR interval 158 ms QRS duration 80 ms QT/QTcB 408/427 ms P-R-T axes 60 -21 34 Normal sinus rhythm Normal ECG When compared with ECG of 16-Aug-2021 11:28, PREVIOUS ECG IS PRESENT      ASSESSMENT AND PLAN:  1.  H/o persisitent atrial fibrillation Doing well maintaining SR on Tikosyn 500 mcg bid Continue Cardizem 240 mg a day Continue  eliquis 5 mg bid This patients CHA2DS2-VASc Score and unadjusted Ischemic Stroke Rate (% per year) is equal to 3.2 % stroke rate/year from a score of 3  Above score calculated as 1 point each if present [CHF, HTN, DM, Vascular=MI/PAD/Aortic Plaque, Age if 65-74, or Female] Above score calculated as 2 points each if present [Age > 75, or Stroke/TIA/TE]  Bmet/mag today  Qtc stable    Labs/ tests ordered today include: none Orders Placed This Encounter  Procedures   Basic metabolic panel   Magnesium   EKG 12-Lead   2. Shortness of breath  H/o interstitial lung disease  Wearing O2 via nasal cannula   F/u in 6 months   Signed, [name] NP 02/14/2022 10:55 AM  Afib Clinic [facility] [address] [place], [place] [address] [phone]

## 2022-02-18 DIAGNOSIS — G4733 Obstructive sleep apnea (adult) (pediatric): Secondary | ICD-10-CM | POA: Diagnosis not present

## 2022-02-18 DIAGNOSIS — J449 Chronic obstructive pulmonary disease, unspecified: Secondary | ICD-10-CM | POA: Diagnosis not present

## 2022-02-18 DIAGNOSIS — J9610 Chronic respiratory failure, unspecified whether with hypoxia or hypercapnia: Secondary | ICD-10-CM | POA: Diagnosis not present

## 2022-02-20 NOTE — Telephone Encounter (Signed)
Spoke with pt and she is aware of Dr. Milas Hock recommendations. Pt scheduled to see Dr. Tomasa Rand 03/28/22 at 8:30am. Reminder in epic for repeat MR/MRCP for October.

## 2022-02-22 ENCOUNTER — Ambulatory Visit (HOSPITAL_COMMUNITY)
Admission: RE | Admit: 2022-02-22 | Discharge: 2022-02-22 | Disposition: A | Discharge status: Home / Self Care | Payer: Medicare Other | Source: Ambulatory Visit | Attending: Physician Assistant | Admitting: Physician Assistant

## 2022-02-22 DIAGNOSIS — I4819 Other persistent atrial fibrillation: Secondary | ICD-10-CM | POA: Insufficient documentation

## 2022-02-22 LAB — BASIC METABOLIC PANEL
Anion gap: 8 (ref 5–15)
BUN: 22 mg/dL (ref 8–23)
CO2: 31 mmol/L (ref 22–32)
Calcium: 10.1 mg/dL (ref 8.9–10.3)
Chloride: 106 mmol/L (ref 98–111)
Creatinine, Ser: 1.06 mg/dL — ABNORMAL HIGH (ref 0.44–1.00)
GFR, Estimated: 56 mL/min — ABNORMAL LOW (ref >60)
Glucose, Bld: 112 mg/dL — ABNORMAL HIGH (ref 70–99)
Potassium: 4.9 mmol/L (ref 3.5–5.1)
Sodium: 145 mmol/L (ref 135–145)

## 2022-02-28 DIAGNOSIS — J449 Chronic obstructive pulmonary disease, unspecified: Secondary | ICD-10-CM | POA: Diagnosis not present

## 2022-02-28 DIAGNOSIS — J9601 Acute respiratory failure with hypoxia: Secondary | ICD-10-CM | POA: Diagnosis not present

## 2022-02-28 DIAGNOSIS — R069 Unspecified abnormalities of breathing: Secondary | ICD-10-CM | POA: Diagnosis not present

## 2022-03-11 DIAGNOSIS — I5030 Unspecified diastolic (congestive) heart failure: Secondary | ICD-10-CM | POA: Diagnosis not present

## 2022-03-11 DIAGNOSIS — E1169 Type 2 diabetes mellitus with other specified complication: Secondary | ICD-10-CM | POA: Diagnosis not present

## 2022-03-11 DIAGNOSIS — I10 Essential (primary) hypertension: Secondary | ICD-10-CM | POA: Diagnosis not present

## 2022-03-11 DIAGNOSIS — E7800 Pure hypercholesterolemia, unspecified: Secondary | ICD-10-CM | POA: Diagnosis not present

## 2022-03-11 DIAGNOSIS — J441 Chronic obstructive pulmonary disease with (acute) exacerbation: Secondary | ICD-10-CM | POA: Diagnosis not present

## 2022-03-12 DIAGNOSIS — E042 Nontoxic multinodular goiter: Secondary | ICD-10-CM | POA: Diagnosis not present

## 2022-03-12 DIAGNOSIS — E7800 Pure hypercholesterolemia, unspecified: Secondary | ICD-10-CM | POA: Diagnosis not present

## 2022-03-12 DIAGNOSIS — M810 Age-related osteoporosis without current pathological fracture: Secondary | ICD-10-CM | POA: Diagnosis not present

## 2022-03-12 DIAGNOSIS — G894 Chronic pain syndrome: Secondary | ICD-10-CM | POA: Diagnosis not present

## 2022-03-12 DIAGNOSIS — Z0000 Encounter for general adult medical examination without abnormal findings: Secondary | ICD-10-CM | POA: Diagnosis not present

## 2022-03-12 DIAGNOSIS — I10 Essential (primary) hypertension: Secondary | ICD-10-CM | POA: Diagnosis not present

## 2022-03-12 DIAGNOSIS — I480 Paroxysmal atrial fibrillation: Secondary | ICD-10-CM | POA: Diagnosis not present

## 2022-03-12 DIAGNOSIS — E1169 Type 2 diabetes mellitus with other specified complication: Secondary | ICD-10-CM | POA: Diagnosis not present

## 2022-03-12 DIAGNOSIS — G4733 Obstructive sleep apnea (adult) (pediatric): Secondary | ICD-10-CM | POA: Diagnosis not present

## 2022-03-12 DIAGNOSIS — I5032 Chronic diastolic (congestive) heart failure: Secondary | ICD-10-CM | POA: Diagnosis not present

## 2022-03-13 DIAGNOSIS — I10 Essential (primary) hypertension: Secondary | ICD-10-CM | POA: Diagnosis not present

## 2022-03-13 DIAGNOSIS — J9611 Chronic respiratory failure with hypoxia: Secondary | ICD-10-CM | POA: Diagnosis not present

## 2022-03-13 DIAGNOSIS — G4733 Obstructive sleep apnea (adult) (pediatric): Secondary | ICD-10-CM | POA: Diagnosis not present

## 2022-03-13 DIAGNOSIS — I480 Paroxysmal atrial fibrillation: Secondary | ICD-10-CM | POA: Diagnosis not present

## 2022-03-15 ENCOUNTER — Encounter: Payer: Self-pay | Admitting: Gastroenterology

## 2022-03-26 ENCOUNTER — Ambulatory Visit: Payer: Medicare Other | Admitting: Podiatry

## 2022-03-26 DIAGNOSIS — M722 Plantar fascial fibromatosis: Secondary | ICD-10-CM | POA: Diagnosis not present

## 2022-03-26 DIAGNOSIS — M79675 Pain in left toe(s): Secondary | ICD-10-CM

## 2022-03-26 DIAGNOSIS — Z7901 Long term (current) use of anticoagulants: Secondary | ICD-10-CM | POA: Diagnosis not present

## 2022-03-26 DIAGNOSIS — M79674 Pain in right toe(s): Secondary | ICD-10-CM

## 2022-03-26 DIAGNOSIS — B351 Tinea unguium: Secondary | ICD-10-CM

## 2022-03-26 NOTE — Progress Notes (Unsigned)
Subjective: Chief Complaint  Patient presents with   Nail Problem    Routine foot care    71 year old female presents the above complaints.  She said the nails are thick and elongated some difficulty trimming them causing discomfort.  No swelling redness or any drainage.  She also describes pain in the bottom of the left heel when she first gets up. As she walks it feels better. No injury. She thinks something may be in her bedroom shoes.   Objective: AAO x3, NAD DP/PT pulses palpable bilaterally, CRT less than 3 seconds Nails are hypertrophic, dystrophic, brittle, discolored, elongated 10. No surrounding redness or drainage. Tenderness nails 1-5 bilaterally.  Incurvation of the hallux and multiple lesser digit nails. No open lesions or pre-ulcerative lesions are identified today. No pain along the bottom of the heel where she gets this discomfort.  This is along the area of the plantar fascia insertion but no pain today.  There is no pain with compression calcaneus.  No other areas of discomfort. No pain with calf compression, swelling, warmth, erythema  Assessment: Symptomatic onychomycosis, plantar fasciitis  Plan: -All treatment options discussed with the patient including all alternatives, risks, complications.  -Sharply debrided the nails x10 without any complications or bleeding -We discussed stretch exercises to be performed before getting out of bed.  Discussed rolling her foot on a frozen water bottle.  Continue shoes and good arch support as well. -Patient encouraged to call the office with any questions, concerns, change in symptoms.   Vivi Barrack DPM

## 2022-03-26 NOTE — Patient Instructions (Signed)

## 2022-03-28 ENCOUNTER — Ambulatory Visit: Payer: Medicare Other | Admitting: Gastroenterology

## 2022-03-28 ENCOUNTER — Encounter: Payer: Self-pay | Admitting: Gastroenterology

## 2022-03-28 ENCOUNTER — Telehealth: Payer: Self-pay | Admitting: Gastroenterology

## 2022-03-28 VITALS — BP 140/80 | HR 64 | Ht 59.25 in | Wt 210.0 lb

## 2022-03-28 DIAGNOSIS — Z86010 Personal history of colonic polyps: Secondary | ICD-10-CM | POA: Diagnosis not present

## 2022-03-28 DIAGNOSIS — K862 Cyst of pancreas: Secondary | ICD-10-CM

## 2022-03-28 NOTE — Patient Instructions (Signed)
_______________________________________________________  If you are age 72 or older, your body mass index should be between 23-30. Your Body mass index is 42.06 kg/m. If this is out of the aforementioned range listed, please consider follow up with your Primary Care Provider.  If you are age 42 or younger, your body mass index should be between 19-25. Your Body mass index is 42.06 kg/m. If this is out of the aformentioned range listed, please consider follow up with your Primary Care Provider.   We will call you to Schedule Hospital Colonoscopy once a day becomes available.  The [place] [facility] providers would like to encourage you to use [facility] to communicate with providers for non-urgent requests or questions.  Due to long hold times on the telephone, sending your provider a message by [facility] may be a faster and more efficient way to get a response.  Please allow 48 business hours for a response.  Please remember that this is for non-urgent requests.   It was a pleasure to see you today!  Thank you for trusting me with your gastrointestinal care!

## 2022-03-28 NOTE — Progress Notes (Signed)
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  She underwent a EUS in April which did not demonstrate any evidence of malignancy but which did show some high risk features on the genetic profile.  A repeat MRI in 6 months was recommended following a multi-disciplinary conference. Today, she presents to clinic to discuss repeat screening colonoscopy.  Her last colonoscopy was in 2015 by [facility], and 4 small polyps were removed (largest 5 mm), three of which were tubular adenomas. She denies any family history of colon cancer.  She does not recall if she has had polyps on previous colonoscopies. She denies any GI symptoms such as abdominal pain, constipation, diarrhea or blood in the stool.  She regular bowel movements.  Her weight has been stable overall.   She has chronic hypoxic respiratory failure on 2L home oxygen.  Her dyspnea has good days and bad days and she is scheduled to see her pulmonologist next week.  She denies other symptoms such as chest pain/pressure, light-headedness/dizziness/presyncope.  No palpitations. She tries to remain active at home and maintains a garden.  She takes Eliquis for atrial fibrillation.  She held this for 2 days when she underwent her EUS   Past Medical History:  Diagnosis Date   Chronic diastolic CHF (congestive heart failure) (HCC)    Chronic respiratory failure (HCC)    Coronary atherosclerosis    a. 2V by CT 10/2017.   Emphysema of lung (HCC)    Hypercholesterolemia    Hypertension    NSIP (nonspecific interstitial pneumonia) (HCC)    Osteoarthritis    Persistent atrial fibrillation (HCC)    Thoracic aortic ectasia [facility])      Past Surgical History:  Procedure Laterality Date   BIOPSY  12/10/2021   Procedure: BIOPSY;  Surgeon: Meridee Score Netty Starring., MD;  Location: [facility];  Service: Gastroenterology;;   CARDIOVERSION N/A 07/25/2016   Procedure:  CARDIOVERSION;  Surgeon: Pricilla Riffle, MD;  Location: [facility]COPY;  Service: Cardiovascular;  Laterality: N/A;   CARDIOVERSION N/A 09/14/2016   Procedure: CARDIOVERSION;  Surgeon: Hillis Range, MD;  Location: [facility];  Service: Cardiovascular;  Laterality: N/A;   CATARACT EXTRACTION Bilateral    ESOPHAGOGASTRODUODENOSCOPY (EGD) WITH PROPOFOL N/A 12/10/2021   Procedure: ESOPHAGOGASTRODUODENOSCOPY (EGD) WITH PROPOFOL;  Surgeon: Lemar Lofty., MD;  [facility];  Service: Gastroenterology;  Laterality: N/A;   EUS N/A 12/10/2021   Procedure: UPPER ENDOSCOPIC ULTRASOUND (EUS) RADIAL;  Surgeon: Lemar Lofty., MD;  Location: [facility];  Service: Gastroenterology;  Laterality: N/A;   FINE NEEDLE ASPIRATION  12/10/2021   Procedure: FINE NEEDLE ASPIRATION;  Surgeon: Meridee Score Netty Starring., MD;  Location: [facility];  Service: Gastroenterology;;   LEG SURGERY Right    TEE WITHOUT CARDIOVERSION N/A 07/25/2016   Procedure: TRANSESOPHAGEAL ECHOCARDIOGRAM (TEE);  Surgeon: Pricilla Riffle, MD;  Location: [facility] ENDOSCOPY;  Service: Cardiovascular;  Laterality: N/A;   Family History  Problem Relation Age of Onset   Heart disease Father    Heart attack Sister    Stroke Neg Hx    Social History   Tobacco Use   Smoking status: Former    Packs/day: 1.00    Years: 43.00    Total pack years: 43.00    Types: Cigarettes    Start date: 08/26/1966    Quit date: 08/26/2010    Years since quitting: 11.5   Smokeless tobacco: Never  Vaping Use  Vaping Use: Never used  Substance Use Topics   Alcohol use: No    Alcohol/week: 0.0 standard drinks of alcohol   Drug use: No   Current Outpatient Medications  Medication Sig Dispense Refill   ACCU-CHEK GUIDE test strip See admin instructions.     acetaminophen (TYLENOL) 500 MG tablet Take 500 mg by mouth every 6 (six) hours as needed for moderate pain.     apixaban (ELIQUIS) 5 MG TABS tablet Take 1 tablet (5 mg total) by mouth 2 (two) times  daily. 56 tablet 0   ascorbic acid (VITAMIN C) 500 MG tablet Take 500 mg by mouth every other day.     atorvastatin (LIPITOR) 20 MG tablet Take 20 mg by mouth every evening.     brimonidine (ALPHAGAN) 0.2 % ophthalmic solution Place 1 drop into both eyes 2 (two) times daily.     calcium citrate-vitamin D (CITRACAL+D) 315-200 MG-UNIT tablet Take 1 tablet by mouth daily.     cholecalciferol (VITAMIN D3) 25 MCG (1000 UNIT) tablet Take 1,000 Units by mouth every other day.     diltiazem (CARDIZEM CD) 240 MG 24 hr capsule TAKE ONE CAPSULE BY MOUTH ONCE DAILY (Patient taking differently: Take 240 mg by mouth daily.) 90 capsule 3   dofetilide (TIKOSYN) 500 MCG capsule TAKE ONE CAPSULE BY MOUTH TWICE DAILY 60 capsule 6   furosemide (LASIX) 40 MG tablet Take 1 tablet (40 mg total) by mouth daily. MUST SCH FOLLOW UP APPT FOR FURTHER REFILLS 1ST ATTEMPT (Patient taking differently: Take 40 mg by mouth daily.) 30 tablet 0   GINKGO BILOBA COMPLEX PO Take 1 tablet by mouth every other day.     HYDROcodone-acetaminophen (NORCO/VICODIN) 5-325 MG tablet Take 1 tablet by mouth every 6 (six) hours as needed for moderate pain.     irbesartan (AVAPRO) 300 MG tablet Take 300 mg by mouth daily.     LORazepam (ATIVAN) 1 MG tablet Take 1 mg by mouth daily as needed for anxiety.  1   Multiple Vitamin (MULTIVITAMIN) capsule Take 1 capsule by mouth daily.     Multiple Vitamins-Minerals (HAIR SKIN NAILS PO) Take 1 tablet by mouth every other day.     omeprazole (PRILOSEC) 40 MG capsule Take 1 capsule (40 mg total) by mouth daily. 30 capsule 6   OXYGEN 2lpm 24/7 and 4 lpm with exertion AHC     Oxymetazoline HCl (SINEX ULTRA FINE MIST 12-HOUR NA) Place 1 spray into the nose daily as needed (congestion).     potassium chloride SA (KLOR-CON M) 20 MEQ tablet TAKE ONE TABLET BY MOUTH ONCE DAILY 90 tablet 3   ROCKLATAN 0.02-0.005 % SOLN Place 1 drop into both eyes at bedtime.     UNABLE TO FIND BIPAP with 2lpm o2 at night  AHC      vitamin B-12 (CYANOCOBALAMIN) 1000 MCG tablet Take 1,000 mcg by mouth every other day.     No current facility-administered medications for this visit.   Allergies  Allergen Reactions   Oxycodone Hcl Itching   Levaquin [Levofloxacin In D5w] Itching and Rash   Percocet [Oxycodone-Acetaminophen] Itching     Review of Systems: All systems reviewed and negative except where noted in HPI.    No results found.  Physical Exam: BP (!) 140/80 (BP Location: Left Arm, Patient Position: Sitting, Cuff Size: Normal)   Pulse 64   Ht 4' 11.25" (1.505 m) Comment: height measured without shoes  Wt 210 lb (95.3 kg)   BMI 42.06 kg/m  Constitutional: Pleasant,well-developed, Caucasian female in no acute distress.  Has portable oxygen tank with nasal cannula HEENT: Normocephalic and atraumatic. Conjunctivae are normal. No scleral icterus. Cardiovascular: Normal rate, regular rhythm.  Pulmonary/chest: Effort normal and breath sounds normal. No wheezing, rales or rhonchi. Abdominal: Soft, nondistended, nontender. Bowel sounds active throughout. There are no masses palpable. No hepatomegaly. Extremities: no edema Neurological: Alert and oriented to person place and time. Skin: Skin is warm and dry. No rashes noted. Psychiatric: Normal mood and affect. Behavior is normal.  CBC    Component Value Date/Time   WBC 9.6 03/14/2020 1530   RBC 5.06 03/14/2020 1530   HGB 15.5 (H) 03/14/2020 1530   HGB 14.4 07/30/2018 1115   HCT 46.7 (H) 03/14/2020 1530   HCT 44.7 07/30/2018 1115   PLT 188 03/14/2020 1530   PLT 198 07/30/2018 1115   MCV 92.3 03/14/2020 1530   MCV 89 07/30/2018 1115   MCH 30.6 03/14/2020 1530   MCHC 33.2 03/14/2020 1530   RDW 14.0 03/14/2020 1530   RDW 13.9 07/30/2018 1115   LYMPHSABS 1.3 10/29/2016 1205   MONOABS 1.1 (H) 10/29/2016 1205   EOSABS 0.2 10/29/2016 1205   BASOSABS 0.1 10/29/2016 1205    CMP     Component Value Date/Time   NA 145 02/22/2022 0937   NA 139  10/21/2018 1003   K 4.9 02/22/2022 0937   CL 106 02/22/2022 0937   CO2 31 02/22/2022 0937   GLUCOSE 112 (H) 02/22/2022 0937   BUN 22 02/22/2022 0937   BUN 16 10/21/2018 1003   CREATININE 1.06 (H) 02/22/2022 0937   CALCIUM 10.1 02/22/2022 0937   PROT 7.9 09/12/2021 0940   ALBUMIN 4.4 09/12/2021 0940   AST 18 09/12/2021 0940   ALT 15 09/12/2021 0940   ALKPHOS 93 09/12/2021 0940   BILITOT 0.5 09/12/2021 0940   GFRNONAA 56 (L) 02/22/2022 0937   GFRAA >60 03/14/2020 1530     ASSESSMENT AND PLAN: 71 year old female with history of 3 small tubular adenomas removed in 2015.  Society guidelines recommend surveillance colonoscopy in 3-5 years.  The patient has significant comorbidities and we discussed the risks and benefits of further polyp surveillance, and the patient does desire to continue to screen for colon cancer.  Will schedule patient for surveillance colonoscopy, which should be her final colonoscopy unless there are high risk polyps identified.  Due to the patient's oxygen-dependency, her procedure will need to be performed in the hospital setting.  The patient is aware that it will likely be several months before there is availability.  Colon cancer screening/History of polyps - Colonoscopy in hospital setting due to oxygen-dependent chronic respiratory failure - Hold Eliquis 2 days prior to colonoscopy  Pancreatic cyst - Repeat MRI/MRCP in October (6 months from April)  The details, risks (including bleeding, perforation, infection, missed lesions, medication reactions and possible hospitalization or surgery if complications occur), benefits, and alternatives to colonoscopy with possible biopsy and possible polypectomy were discussed with the patient and she consents to proceed.    Tamera Pingley E. Tomasa Rand, MD [facility]   CC:  [name], MD

## 2022-03-28 NOTE — Telephone Encounter (Signed)
Patient was not scheduled for a Colonoscopy but I will let Dr.cunningham know that she would like to be taken off of the list.

## 2022-03-28 NOTE — Telephone Encounter (Signed)
Patient called and requested to cancel the order for a colonoscopy.

## 2022-03-31 DIAGNOSIS — R069 Unspecified abnormalities of breathing: Secondary | ICD-10-CM | POA: Diagnosis not present

## 2022-03-31 DIAGNOSIS — J9601 Acute respiratory failure with hypoxia: Secondary | ICD-10-CM | POA: Diagnosis not present

## 2022-03-31 DIAGNOSIS — J449 Chronic obstructive pulmonary disease, unspecified: Secondary | ICD-10-CM | POA: Diagnosis not present

## 2022-04-02 ENCOUNTER — Encounter: Payer: Self-pay | Admitting: Internal Medicine

## 2022-04-02 ENCOUNTER — Ambulatory Visit: Payer: Medicare Other | Admitting: Internal Medicine

## 2022-04-02 DIAGNOSIS — J8410 Pulmonary fibrosis, unspecified: Secondary | ICD-10-CM

## 2022-04-02 DIAGNOSIS — J9612 Chronic respiratory failure with hypercapnia: Secondary | ICD-10-CM

## 2022-04-02 DIAGNOSIS — J9611 Chronic respiratory failure with hypoxia: Secondary | ICD-10-CM | POA: Diagnosis not present

## 2022-04-02 NOTE — Patient Instructions (Signed)
Make sure you check your oxygen saturation  AT  your highest level of activity (not after you stop)   to be sure it stays over 90% and adjust  02 flow upward to maintain this level if needed but remember to turn it back to previous settings when you stop (to conserve your supply).    Please schedule a follow up visit in  6 months but call sooner if needed  

## 2022-04-02 NOTE — Progress Notes (Unsigned)
Subjective:     Patient ID: [name], female   DOB: 1951-06-08    MRN: [id number]    Brief patient profile:  52   yowf quit smoking 2012 due to some cough/wheezing improved p quit at wt 180 and no obst on spirometry 06/25/16  referred to pulmonary clinic 06/25/2016 by Dr   Shirline Frees s/p admit:   Admit date: 02/05/2016 Discharge date: 02/08/2016    Discharge Diagnoses:  Principal Problem:   Acute respiratory failure with hypoxia ([place]) Active Problems:   HTN (hypertension)   PNA (pneumonia)   Glaucoma       History of Present Illness  06/25/2016 1st [facility] office visit/ [name]   ? NSIP  Chief Complaint  Patient presents with   Consult visit    COPD consult per [name]- Pt states SOB w/ excertion. No congestion or tightness. Occasional coughing at times.   onset of sob spring 2017 x one month prior to admit on 02/05/16 gradually worse while on lisinopril  In April 2017 was using [facility] parking but still able to do [employer]  Now sob across the room  Sob occurs at rest if speaking  Able to lie down rotated on to left side otherwise chokes/ smothers rec Stop lotensin and start ibesartan 150 mg one daily  And your breathing should gradually improve  Please schedule a follow up office visit in 2 weeks, sooner if needed to see [name] to recheck your blood pressure  Add double the lasix dose for now    10/29/2016 acute extended ov/[name] re: new onset resp failure/ pt maint on eliquis but no rsp rx  Chief Complaint  Patient presents with   Acute Visit    Pt c/o increased SOB and lower o2 sats for the past wk.   gradually worse sob x one week assoc with low 02 sats though baseline doe = MMRC3 = can't walk 100 yards even at a slow pace at a flat grade s stopping due to sob   No sign cough/ no chills no cp or vomiting  Leg swelling no worse than usual  rec Please see patient coordinator before you leave today  to schedule start 02 24/7 at 2lpm and ambulatory 02  titration to see if eligible for POC  Wear 02 2lpm 24/7 for now at 2lpm and increase to 4lpm with exertion Use your incentive spirometer as much as you can  Please remember to go to the lab and x-ray department downstairs in the basement  for your tests - we will call you with the results when they are available. I will arrange follow up for you here if indicated, otherwise just return to cardiology as planned  Late add:  Needs hrct next p taking one extra lasix daily x one week > NSIP    11/18/2017  f/u ov/[name] re:  ?NSIP last bird exp 2-3 years  02 2lpm bipap and 2lpm with activity  Chief Complaint  Patient presents with   Follow-up    PFT's done today. Breathing is unchanged.   Dyspnea:  Still shopping at [employer]/ wearing 2lpm with activity  Cough: none Sleep: sleep ok on cpap/ 02 SABA use:  n/a Roderic Palau  Now following for Cards rec Avoid bird exposure  I will let Roderic Palau know about your Aortic enlargement but the main treatment is to lose weight and keep your blood pressure down         10/02/2021  f/u ov/[name]  re:  NSIP / CAF   maint on 2lpm hs/ 2lpm - 4 lpm POC   Chief Complaint  Patient presents with   Follow-up    Breathing has been worse over the past 2 months. She has a lot of cough in the am- prod with green sputum.   Dyspnea:  [employer] occasionally / does laps around house x 5 min but not consistently checking sats or titrating to > 90%  Cough: more than usual esp in am/ greenish assoc nasal congestion  Sleeping: on bipap on back and 2lpm per sleep clinic SABA use: none  02: as above  Covid status:  never  Rec Make sure you check your oxygen saturation  AT  your highest level of activity (not after you stop)   to be sure it stays over 90% and adjust  02 flow upward to maintain this level if needed but remember to turn it back to previous settings when you stop (to conserve your supply).    Omnicef 300 mg twice daily should clear up your mucus - if not call  for earlier f/u   04/02/2022  f/u ov/[name] re: NSIP  maint on 02 only /  CAF   Chief Complaint  Patient presents with   Follow-up    No new issues since LOV.  Dyspnea:  most of her shopping is [employer] /pushing cart on 3lpm not checking sats  Cough: minimal mucoid worse in am  Sleeping: bipap and 02 2lpm  SABA use: none  02: 2lpm at rest up to 3 with activity  Covid status:   never vax   No obvious day to day or daytime variability or assoc excess/ purulent sputum or mucus plugs or hemoptysis or cp or chest tightness, subjective wheeze or overt sinus or hb symptoms.   Sleeping as above  without nocturnal  or early am exacerbation  of respiratory  c/o's or need for noct saba. Also denies any obvious fluctuation of symptoms with weather or environmental changes or other aggravating or alleviating factors except as outlined above   No unusual exposure hx or h/o childhood pna/ asthma or knowledge of premature birth.  Current Allergies, Complete Past Medical History, Past Surgical History, Family History, and Social History were reviewed in [employer] record.  ROS  The following are not active complaints unless bolded Hoarseness, sore throat, dysphagia, dental problems, itching, sneezing,  nasal congestion or discharge of excess mucus or purulent secretions, ear ache,   fever, chills, sweats, unintended wt loss or wt gain, classically pleuritic or exertional cp,  orthopnea pnd or arm/hand swelling  or leg swelling, presyncope, palpitations, abdominal pain, anorexia, nausea, vomiting, diarrhea  or change in bowel habits or change in bladder habits, change in stools or change in urine, dysuria, hematuria,  rash, arthralgias, visual complaints, headache, numbness, weakness or ataxia or problems with walking or coordination,  change in mood or  memory.        Current Meds  Medication Sig   ACCU-CHEK GUIDE test strip See admin instructions.   acetaminophen (TYLENOL) 500 MG  tablet Take 500 mg by mouth every 6 (six) hours as needed for moderate pain.   apixaban (ELIQUIS) 5 MG TABS tablet Take 1 tablet (5 mg total) by mouth 2 (two) times daily.   ascorbic acid (VITAMIN C) 500 MG tablet Take 500 mg by mouth every other day.   atorvastatin (LIPITOR) 20 MG tablet Take 20 mg by mouth every evening.   brimonidine (ALPHAGAN) 0.2 %  ophthalmic solution Place 1 drop into both eyes 2 (two) times daily.   calcium citrate-vitamin D (CITRACAL+D) 315-200 MG-UNIT tablet Take 1 tablet by mouth daily.   cholecalciferol (VITAMIN D3) 25 MCG (1000 UNIT) tablet Take 1,000 Units by mouth every other day.   clotrimazole-betamethasone (LOTRISONE) cream Apply 1 Application topically 2 (two) times daily.   diltiazem (CARDIZEM CD) 240 MG 24 hr capsule TAKE ONE CAPSULE BY MOUTH ONCE DAILY (Patient taking differently: Take 240 mg by mouth daily.)   dofetilide (TIKOSYN) 500 MCG capsule TAKE ONE CAPSULE BY MOUTH TWICE DAILY   furosemide (LASIX) 40 MG tablet Take 1 tablet (40 mg total) by mouth daily. MUST SCH FOLLOW UP APPT FOR FURTHER REFILLS 1ST ATTEMPT (Patient taking differently: Take 40 mg by mouth daily.)   GINKGO BILOBA COMPLEX PO Take 1 tablet by mouth every other day.   HYDROcodone-acetaminophen (NORCO/VICODIN) 5-325 MG tablet Take 1 tablet by mouth every 6 (six) hours as needed for moderate pain.   irbesartan (AVAPRO) 300 MG tablet Take 300 mg by mouth daily.   LORazepam (ATIVAN) 1 MG tablet Take 1 mg by mouth daily as needed for anxiety.   Multiple Vitamin (MULTIVITAMIN) capsule Take 1 capsule by mouth daily.   Multiple Vitamins-Minerals (HAIR SKIN NAILS PO) Take 1 tablet by mouth every other day.   omeprazole (PRILOSEC) 40 MG capsule Take 1 capsule (40 mg total) by mouth daily.   OXYGEN 2lpm 24/7 and 4 lpm with exertion AHC   Oxymetazoline HCl (SINEX ULTRA FINE MIST 12-HOUR NA) Place 1 spray into the nose daily as needed (congestion).   potassium chloride SA (KLOR-CON M) 20 MEQ tablet  TAKE ONE TABLET BY MOUTH ONCE DAILY (Patient taking differently: Take 10 mEq by mouth daily.)   ROCKLATAN 0.02-0.005 % SOLN Place 1 drop into both eyes at bedtime.   UNABLE TO FIND BIPAP with 2lpm o2 at night  AHC   vitamin B-12 (CYANOCOBALAMIN) 1000 MCG tablet Take 1,000 mcg by mouth every other day.               Objective:   Physical Exam  Wts  04/02/2022           208  10/02/2021           201   03/26/2021          208 09/25/2020         206 01/05/2020         202  01/06/2019         193  05/21/2018         190  11/18/2017         198  06/06/2017       200  .03/06/2017        204  12/05/2016         203   07/23/2016     208   06/25/16 204 lb 1.6 oz (92.6 kg)  02/06/16 204 lb 9.4 oz (92.8 kg)  02/25/08 163 lb (73.9 kg)    Vital signs reviewed  04/02/2022  - Note at rest 02 sats  95% on 2lpm pulsed    General appearance:   MO (by BMI) amb wf nad     HEENT : Oropharynx  clear     Nasal turbinates nl    NECK :  without  apparent JVD/ palpable Nodes/TM    LUNGS: no acc muscle use,  Nl contour chest with coarse insp crackles in bases but without cough on  insp or exp maneuvers   CV: IRIR  no s3 or murmur or increase in P2, and  trace pitting both ankles sym   ABD:  soft and nontender with nl inspiratory excursion in the supine position. No bruits or organomegaly appreciated   MS:  Nl gait/ ext warm without deformities Or obvious joint restrictions  calf tenderness, cyanosis or clubbing    SKIN: warm and dry without lesions    NEURO:  alert, approp, nl sensorium with  no motor or cerebellar deficits apparent.       I personally reviewed images and agree with radiology impression as follows:   Chest CTa 01/15/22 Mild centrilobular emphysema. No pleural effusion or pneumothorax. Subpleural reticulation and geographic ground-glass opacities are similar comparison to prior and likely reflect a degree of underlying interstitial lung disease.             Assessment:

## 2022-04-03 ENCOUNTER — Encounter: Payer: Self-pay | Admitting: Internal Medicine

## 2022-04-03 NOTE — Assessment & Plan Note (Signed)
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 However, there does appear to be a mild craniocaudal gradient. The possibility of early usual interstitial pneumonia (UIP) although not favored, is not entirely excluded. Accordingly, repeat high-resolution chest CT is recommended in 12 months  - PFT's  03/06/2017  F VC  1.77 (63%)  no % improvement from saba p 0  prior to study with DLCO  67/66 % corrects to 99  % for alv volume   - 77mw  06/06/2017   304 m on 4lpm s stopping or desats - HRCT chest 11/13/17 1. 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 -  11/18/2017  HSP serology/ collagen vasc profile >  Neg  - PFT's  11/18/2017  FVC 1.71 (61%)  with DLCO  64/59c  % corrects to 94 % for alv volume  And ERV 34% - CTa Chest 01/09/21 no change  > same 07/17/21   No evidence of progression / f/u can be q 6 m

## 2022-04-03 NOTE — Assessment & Plan Note (Signed)
HCO3  10/29/2016  = 32   Qualified for 02  10/29/16  Saturations on Room Air at Rest =87% - Patient Saturations on 2lpm o2= 93% - Sats walking on 4lpm 90% Started bipap per Dr Leonides Sake 11/29/16  - 12/05/2016  Patient Saturations on Room Air at Rest =88% Patient Saturations on 2lpm o2= 93% - sats ok 06/06/2017 on 6 mw on 4lpm  - HCO3 10/01/17 = 25 so hypercarbia clearly better  - 05/21/2018   [facility] RA  2 laps @ 185 ft each stopped due to desats to 86% corrected on 2lpm / slow pace  - 01/05/2020 RA 92% and on 3lpm POC sats 90% p 750 ft slow pace / stopping last lap due to sob  - 03/26/2021   Walked on 2lpm  x one lap =  approx 250 ft -@ avg pace stopped due to sob with sats 90%  - HC03  08/31/21   = 29  - HC03  02/22/22 = 31 04/02/2022   Walked on 3lpm cont  x  3  lap(s) =  approx 750  ft  @ slow pace, stopped due to end of study with lowest 02 sats 89% with doe toward the end    Well compensated on prsent rx   Advised: Make sure you check your oxygen saturation  AT  your highest level of activity (not after you stop)   to be sure it stays over 90% and adjust  02 flow upward to maintain this level if needed but remember to turn it back to previous settings when you stop (to conserve your supply).           Each maintenance medication was reviewed in detail including emphasizing most importantly the difference between maintenance and prns and under what circumstances the prns are to be triggered using an action plan format where appropriate.  Total time for H and P, chart review, counseling,  directly observing portions of ambulatory 02 saturation study/ and generating customized AVS unique to this office visit / same day charting = 20 min

## 2022-04-08 ENCOUNTER — Other Ambulatory Visit: Payer: Self-pay | Admitting: Interventional Cardiology

## 2022-04-11 DIAGNOSIS — I5030 Unspecified diastolic (congestive) heart failure: Secondary | ICD-10-CM | POA: Diagnosis not present

## 2022-04-11 DIAGNOSIS — I10 Essential (primary) hypertension: Secondary | ICD-10-CM | POA: Diagnosis not present

## 2022-04-11 DIAGNOSIS — E7800 Pure hypercholesterolemia, unspecified: Secondary | ICD-10-CM | POA: Diagnosis not present

## 2022-04-11 DIAGNOSIS — E1169 Type 2 diabetes mellitus with other specified complication: Secondary | ICD-10-CM | POA: Diagnosis not present

## 2022-04-11 DIAGNOSIS — N183 Chronic kidney disease, stage 3 unspecified: Secondary | ICD-10-CM | POA: Diagnosis not present

## 2022-04-24 NOTE — Progress Notes (Unsigned)
Office Visit    Patient Name: [name] Date of Encounter: 04/25/2022  PCP:  [name], MD   [facility]  Cardiologist:  Lance Muss, MD  Advanced Practice Provider:  No care team member to display Electrophysiologist:  None   HPI    [name] is a 71 y.o. female with a past medical history significant for chronic diastolic CHF, coronary atherosclerosis (CT 10/2017), emphysema, hyperlipidemia, hypertension, persistent atrial fibrillation presents today for annual follow-up.  She [facility]20[facility].  At that time she reported 2 short episodes of atrial fibrillation the month prior, otherwise staying in normal sinus rhythm.  She is compliant on Tikosyn and Eliquis.  CHA2DS2-VASc score 3.  No bleeding issues.  She is feeling more short of breath she does have a history of pulmonary fibrosis.  She remains on oxygen via nasal cannula.  She had noted her Fitbit at night and occasionally will show irregular rhythm but heart rate's are not elevated.  Asymptomatic.  Today, she has been doing pretty good.  She has been on the same dose of Tikosyn for several years.  She does have some shortness of breath when she is active but she has pulmonary fibrosis.  She has some dependent edema when she is active but it resolves when she puts her feet up at night.  We discussed her triglycerides which were slightly elevated.  She states that she likes yogurt covered pretzels and various snacks.  We discussed dietary changes and plan to follow-up with a lipid panel in 3 months to recheck her triglycerides.  She does have a diagnosis of chronic diastolic CHF and we discussed updating an echo today.   Reports no chest pain, pressure, or tightness. No orthopnea, PND. Reports no palpitations.    Past Medical History    Past Medical History:  Diagnosis Date   Chronic diastolic CHF (congestive heart failure) (HCC)    Chronic respiratory failure  (HCC)    Coronary atherosclerosis    a. 2V by CT 10/2017.   Emphysema of lung (HCC)    Hypercholesterolemia    Hypertension    NSIP (nonspecific interstitial pneumonia) (H[facility])    Osteoarthritis    Persistent atrial fibrillation (HCC)    Thoracic aortic ectasia [facility])    Past Surgical History:  Procedure Laterality Date   BIOPSY  12/10/2021   Procedure: BIOPSY;  Surgeon: Meridee Score Netty Starring., MD;  Location: [facility] ENDOSCOPY;  Service: Gastroenterology;;   CARDIOVERSION N/A 07/25/2016   Procedure: CARDIOVERSION;  Surgeon: Pricilla Riffle, MD;  Location: [facility] ENDOSCOPY;  Service: Cardiovascular;  Laterality: N/A;   CARDIOVERSION N/A 09/14/2016   Procedure: CARDIOVERSION;  Surgeon: Hillis Range, MD;  Location[facility];  Service: Cardiovascular;  Laterality: N/A;   CATARACT EXTRACTION Bilateral    ESOPHAGOGASTRODUODENOSCOPY (EGD) WITH PROPOFOL N/A 12/10/2021   Procedure: ESOPHAGOGASTRODUODENOSCOPY (EGD) WITH PROPOFOL;  Surgeon: Lemar Lofty., MD;  Location: [facility] ENDOSCOPY;  Service: Gastroenterology;  Laterality: N/A;   EUS N/A 12/10/2021   Procedure: UPPER ENDOSCOPIC ULTRASOUND (EUS) RADIAL;  Surgeon: Lemar Lofty., MD;  Location: [facility] ENDOSCOPY;  Service: Gastroenterology;  Laterality: N/A;   FINE NEEDLE ASPIRATION  12/10/2021   Procedure: FINE NEEDLE ASPIRATION;  Surgeon: Meridee Score Netty Starring., MD;  Location: [facility] ENDOSCOPY;  Service: Gastroenterology;;   LEG SURGERY Right    TEE WITHOUT CARDIOVERSION N/A 07/25/2016   Procedure: TRANSESOPHAGEAL ECHOCARDIOGRAM (TEE);  Surgeon: Pricilla Riffle, MD;  Location[facility] ENDOSCOPY;  Service:  Cardiovascular;  Laterality: N/A;    Allergies  Allergies  Allergen Reactions   Oxycodone Hcl Itching   Levaquin [Levofloxacin In D5w] Itching and Rash   Percocet [Oxycodone-Acetaminophen] Itching     EKGs/Labs/Other Studies Reviewed:   The following studies were reviewed today:  CT angio 01/15/2022   IMPRESSION: 1. Indolent increase in size of the  descending thoracic aortic aneurysm to approximately 4.8 cm, previously 4.6 cm. There is mildly increased mass effect on the adjacent LEFT mainstem bronchus. Recommend semi-annual imaging followup by CTA or MRA and referral to cardiothoracic surgery if not already obtained. This recommendation follows 2010 ACCF/AHA/AATS/ACR/ASA/SCA/SCAI/SIR/STS/SVM Guidelines for the Diagnosis and Management of Patients With Thoracic Aortic Disease. Circulation. 2010; 121: O270-J50. Aortic aneurysm NOS (ICD10-I71.9) This follows ACR consensus guidelines: Managing Incidental Findings on Thoracic CT: Mediastinal and Cardiovascular Findings. J Am Coll Radiol 2018;15:1087-1096. 2. Similar appearance of additional aneurysmal dilation of the distal aspect of the descending thoracic aorta as well as multiple penetrating atheromatous ulcers.   Aortic Atherosclerosis (ICD10-I70.0) and Emphysema (ICD10-J43.9).   EKG:  EKG is not ordered today.   Recent Labs: 09/12/2021: ALT 15 02/14/2022: Magnesium 2.2 02/22/2022: BUN 22; Creatinine, Ser 1.06; Potassium 4.9; Sodium 145  Recent Lipid Panel    Component Value Date/Time   CHOL 205 (H) 07/24/2016 0044   TRIG 50 07/24/2016 0044   HDL 64 07/24/2016 0044   CHOLHDL 3.2 07/24/2016 0044   VLDL 10 07/24/2016 0044   LDLCALC 131 (H) 07/24/2016 0044    Risk Assessment/Calculations:   CHA2DS2-VASc Score = 6   This indicates a 9.7% annual risk of stroke. The patient's score is based upon: CHF History: 1 HTN History: 1 Diabetes History: 1 Stroke History: 0 Vascular Disease History: 1 Age Score: 1 Gender Score: 1     Home Medications   Current Meds  Medication Sig   ACCU-CHEK GUIDE test strip See admin instructions.   acetaminophen (TYLENOL) 500 MG tablet Take 500 mg by mouth every 6 (six) hours as needed for moderate pain.   apixaban (ELIQUIS) 5 MG TABS tablet Take 1 tablet (5 mg total) by mouth 2 (two) times daily.   ascorbic acid (VITAMIN C) 500 MG  tablet Take 500 mg by mouth every other day.   atorvastatin (LIPITOR) 20 MG tablet Take 20 mg by mouth every evening.   brimonidine (ALPHAGAN) 0.2 % ophthalmic solution Place 1 drop into both eyes 2 (two) times daily.   calcium citrate-vitamin D (CITRACAL+D) 315-200 MG-UNIT tablet Take 1 tablet by mouth daily.   cholecalciferol (VITAMIN D3) 25 MCG (1000 UNIT) tablet Take 1,000 Units by mouth every other day.   clotrimazole-betamethasone (LOTRISONE) cream Apply 1 Application topically 2 (two) times daily.   diltiazem (CARDIZEM CD) 240 MG 24 hr capsule Take 1 capsule (240 mg total) by mouth daily. Please call to schedule an overdue appointment with Dr. [name] for refills, [phone], thank you. 1st attempt.   dofetilide (TIKOSYN) 500 MCG capsule TAKE ONE CAPSULE BY MOUTH TWICE DAILY   furosemide (LASIX) 40 MG tablet Take 1 tablet (40 mg total) by mouth daily. MUST SCH FOLLOW UP APPT FOR FURTHER REFILLS 1ST ATTEMPT   GINKGO BILOBA COMPLEX PO Take 1 tablet by mouth every other day.   HYDROcodone-acetaminophen (NORCO/VICODIN) 5-325 MG tablet Take 1 tablet by mouth every 6 (six) hours as needed for moderate pain.   irbesartan (AVAPRO) 300 MG tablet Take 300 mg by mouth daily.   LORazepam (ATIVAN) 1 MG tablet Take 1 mg by mouth daily  as needed for anxiety.   Multiple Vitamin (MULTIVITAMIN) capsule Take 1 capsule by mouth daily.   Multiple Vitamins-Minerals (HAIR SKIN NAILS PO) Take 1 tablet by mouth every other day.   omeprazole (PRILOSEC) 40 MG capsule Take 1 capsule (40 mg total) by mouth daily.   OXYGEN 2lpm 24/7 and 4 lpm with exertion AHC   Oxymetazoline HCl (SINEX ULTRA FINE MIST 12-HOUR NA) Place 1 spray into the nose daily as needed (congestion).   potassium chloride SA (KLOR-CON M) 20 MEQ tablet TAKE ONE TABLET BY MOUTH ONCE DAILY (Patient taking differently: Take 10 mEq by mouth daily.)   ROCKLATAN 0.02-0.005 % SOLN Place 1 drop into both eyes at bedtime.   UNABLE TO FIND BIPAP with 2lpm  o2 at night  AHC   vitamin B-12 (CYANOCOBALAMIN) 1000 MCG tablet Take 1,000 mcg by mouth every other day.     Review of Systems      All other systems reviewed and are otherwise negative except as noted above.  Physical Exam    VS:  BP 122/78   Pulse 68   SpO2 94%  , BMI There is no height or weight on file to calculate BMI.  Wt Readings from Last 3 Encounters:  04/02/22 208 lb 6.4 oz (94.5 kg)  03/28/22 210 lb (95.3 kg)  02/14/22 209 lb 6.4 oz (95 kg)     GEN: Well nourished, well developed, in no acute distress. HEENT: normal. Neck: Supple, no JVD, carotid bruits, or masses. Cardiac: RRR, no murmurs, rubs, or gallops. No clubbing, cyanosis, edema.  Radials/PT 2+ and equal bilaterally.  Respiratory:  Respirations regular and unlabored, clear to auscultation bilaterally. GI: Soft, nontender, nondistended. MS: No deformity or atrophy. Skin: Warm and dry, no rash. Neuro:  Strength and sensation are intact. Psych: Normal affect.  Assessment & Plan    Persistent atrial fibrillation -NSR today on exam -continue Tikosyn and Eliquis -She is followed by the atrial fibrillation clinic every 6 months  Chronic diastolic CHF -update echo -some SOB but has pulmonary fibrosis -some pedal edema but uses pedihose  Coronary atherosclerosis -Continue Lipitor 20 mg daily -We will get an updated lipid panel and LFTs in 3 months  Hypertension -well controlled today -continue current medication regimen  Hyperlipidemia -triglycerides 218 -try some dietary changes and then repeat labs in 3 months  Aortic aneurysm -Surveillance per TCTS -yearly CT angio (due 12/2022)         Disposition: Follow up 1 year with Lance Muss, MD or APP.  Signed, Sharlene Dory, PA-C 04/25/2022, 9:23 AM [facility]

## 2022-04-25 ENCOUNTER — Ambulatory Visit: Payer: Medicare Other | Attending: Physician Assistant | Admitting: Physician Assistant

## 2022-04-25 ENCOUNTER — Encounter: Payer: Self-pay | Admitting: Physician Assistant

## 2022-04-25 VITALS — BP 122/78 | HR 68

## 2022-04-25 DIAGNOSIS — I10 Essential (primary) hypertension: Secondary | ICD-10-CM

## 2022-04-25 DIAGNOSIS — I2510 Atherosclerotic heart disease of native coronary artery without angina pectoris: Secondary | ICD-10-CM

## 2022-04-25 DIAGNOSIS — E785 Hyperlipidemia, unspecified: Secondary | ICD-10-CM | POA: Diagnosis not present

## 2022-04-25 DIAGNOSIS — I4819 Other persistent atrial fibrillation: Secondary | ICD-10-CM

## 2022-04-25 DIAGNOSIS — I5032 Chronic diastolic (congestive) heart failure: Secondary | ICD-10-CM | POA: Diagnosis not present

## 2022-04-25 DIAGNOSIS — I2584 Coronary atherosclerosis due to calcified coronary lesion: Secondary | ICD-10-CM

## 2022-04-25 DIAGNOSIS — I7121 Aneurysm of the ascending aorta, without rupture: Secondary | ICD-10-CM | POA: Diagnosis not present

## 2022-04-25 NOTE — Patient Instructions (Signed)
Medication Instructions:  Your physician recommends that you continue on your current medications as directed. Please refer to the Current Medication list given to you today.  *If you need a refill on your cardiac medications before your next appointment, please call your pharmacy*   Lab Work: Fasting lipids and lft's in 3 months If you have labs (blood work) drawn today and your tests are completely normal, you will receive your results only by: MyChart Message (if you have MyChart) OR A paper copy in the mail If you have any lab test that is abnormal or we need to change your treatment, we will call you to review the results.   Testing/Procedures: Your physician has requested that you have an echocardiogram. Echocardiography is a painless test that uses sound waves to create images of your heart. It provides your doctor with information about the size and shape of your heart and how well your heart's chambers and valves are working. This procedure takes approximately one hour. There are no restrictions for this procedure.    Follow-Up: At [facility], you and your health needs are our priority.  As part of our continuing mission to provide you with exceptional heart care, we have created designated Provider Care Teams.  These Care Teams include your primary Cardiologist (physician) and Advanced Practice Providers (APPs -  Physician Assistants and Nurse Practitioners) who all work together to provide you with the care you need, when you need it.  Your next appointment:   1 year(s)  The format for your next appointment:   In Person  Provider:   Lance Muss, MD   High Triglycerides Eating Plan Triglycerides are a type of fat in the blood. High levels of triglycerides can increase your risk of heart disease and stroke. If your triglyceride levels are high, choosing the right foods can help lower your triglycerides and keep your heart healthy. Work with your health care  provider or a dietitian to develop an eating plan that is right for you. What are tips for following this plan? General guidelines  Lose weight, if you are overweight. For most people, losing 5-10 lb (2-5 kg) helps lower triglyceride levels. A weight-loss plan may include: 30 minutes of exercise at least 5 days a week. Reducing the amount of calories, sugar, and fat you eat. Eat a wide variety of fresh fruits, vegetables, and whole grains. These foods are high in fiber. Eat foods that contain healthy fats, such as fatty fish, nuts, seeds, and olive oil. Avoid foods that are high in added sugar, added salt (sodium), and saturated fat. Avoid low-fiber, refined carbohydrates such as white bread, crackers, noodles, and white rice. Avoid foods with trans fats or partially hydrogenated oils, such as fried foods or stick margarine. If you drink alcohol: Limit how much you have to: 0-1 drink a day for women who are not pregnant. 0-2 drinks a day for men. Your health care provider may recommend that you drink less than these amounts depending on your overall health. Know how much alcohol is in a drink. In the [place] one drink equals one 12 oz bottle of beer (355 mL), one 5 oz glass of wine (148 mL), or one 1 oz glass of hard liquor (44 mL). Reading food labels Check food labels for: The amount of saturated fat. Choose foods with no or very little saturated fat (less than 2 g). The amount of trans fat. Choose foods with no transfat. The amount of cholesterol. Choose foods that are  low in cholesterol. The amount of sodium. Choose foods with less than 140 milligrams (mg) per serving. Shopping Buy dairy products labeled as nonfat (skim) or low-fat (1%). Avoid buying processed or prepackaged foods. These are often high in added sugar, sodium, and fat. Cooking Choose healthy fats when cooking, such as olive oil, avocado oil, or canola oil. Cook foods using lower fat methods, such as baking, broiling,  boiling, or grilling. Make your own sauces, dressings, and marinades when possible, instead of buying them. Store-bought sauces, dressings, and marinades are often high in sodium and sugar. Meal planning Eat more home-cooked food and less restaurant, buffet, and fast food. Eat fatty fish at least 2 times each week. Examples of fatty fish include salmon, trout, sardines, mackerel, tuna, and herring. If you eat whole eggs, do not eat more than 4 egg yolks per week. What foods should I eat? Fruits All fresh, canned (in natural juice), or frozen fruits. Vegetables Fresh or frozen vegetables. Low-sodium canned vegetables. Grains Whole wheat or whole grain breads, crackers, cereals, and pasta. Unsweetened oatmeal. Bulgur. Barley. Quinoa. Brown rice. Whole wheat flour tortillas. Meats and other proteins Skinless chicken or [place]. Ground chicken or [place]. Lean cuts of pork, trimmed of fat. Fish and seafood, especially salmon, trout, and herring. Egg whites. Dried beans, peas, or lentils. Unsalted nuts or seeds. Unsalted canned beans. Natural peanut or almond butter or other nut butters. Dairy Low-fat dairy products. Skim or low-fat (1%) milk. Reduced fat (2%) and low-sodium cheese. Low-fat ricotta cheese. Low-fat cottage cheese. Plain, low-fat yogurt. Fats and oils Tub margarine without trans fats. Light or reduced-fat mayonnaise. Light or reduced-fat salad dressings. Avocado. Safflower, olive, sunflower, soybean, and canola oils. The items listed above may not be a complete list of recommended foods and beverages. Talk with your dietitian about what dietary choices are best for you. What foods should I avoid? Fruits Sweetened dried fruit. Canned fruit in syrup. Fruit juice. Vegetables Creamed or fried vegetables. Vegetables in a cheese sauce. Grains White bread. White (regular) pasta. White rice. Cornbread. Bagels. Pastries. Crackers that contain trans fat. Meats and other proteins Fatty cuts  of meat. Ribs. Chicken wings. [name]. Sausage. Bologna. Salami. Chitterlings. Fatback. Hot dogs. Bratwurst. Packaged lunch meats. Dairy Whole or reduced-fat (2%) milk. Half-and-half. Cream cheese. Full-fat or sweetened yogurt. Full-fat cheese. Nondairy creamers. Whipped toppings. Processed cheese or cheese spreads. Cheese curds. Fats and oils Butter. Stick margarine. Lard. Shortening. Ghee. Bacon fat. Tropical oils, such as coconut, palm kernel, or palm oils. Beverages Alcohol. Sweetened drinks, such as soda, lemonade, fruit drinks, or punches. Sweets and desserts Corn syrup. Sugars. Honey. Molasses. Candy. Jam and jelly. Syrup. Sweetened cereals. Cookies. Pies. Cakes. Donuts. Muffins. Ice cream. Condiments Store-bought sauces, dressings, and marinades that are high in sugar, such as ketchup and barbecue sauce. The items listed above may not be a complete list of foods and beverages you should avoid. Talk with your dietitian about what dietary choices are best for you. Summary High levels of triglycerides can increase the risk of heart disease and stroke. Choosing the right foods can help lower your triglycerides. Eat plenty of fresh fruits, vegetables, and whole grains. Choose low-fat dairy and lean meats. Eat fatty fish at least twice a week. Avoid processed and prepackaged foods with added sugar, sodium, saturated fat, and trans fat. If you need suggestions or have questions about what types of food are good for you, talk with your health care provider or a dietitian. This information is not intended  to replace advice given to you by your health care provider. Make sure you discuss any questions you have with your health care provider. Document Revised: 12/22/2020 Document Reviewed: 12/22/2020 Elsevier Patient Education  2023 Elsevier Inc.   Important Information About Sugar

## 2022-05-01 DIAGNOSIS — J9601 Acute respiratory failure with hypoxia: Secondary | ICD-10-CM | POA: Diagnosis not present

## 2022-05-01 DIAGNOSIS — J449 Chronic obstructive pulmonary disease, unspecified: Secondary | ICD-10-CM | POA: Diagnosis not present

## 2022-05-01 DIAGNOSIS — R069 Unspecified abnormalities of breathing: Secondary | ICD-10-CM | POA: Diagnosis not present

## 2022-05-03 ENCOUNTER — Other Ambulatory Visit (HOSPITAL_COMMUNITY): Payer: Self-pay

## 2022-05-03 MED ORDER — APIXABAN 5 MG PO TABS: 5.0000 mg | ORAL_TABLET | Freq: Two times a day (BID) | ORAL | 0 refills | Status: DC

## 2022-05-06 ENCOUNTER — Ambulatory Visit: Payer: Medicare Other | Admitting: Podiatry

## 2022-05-06 DIAGNOSIS — M79675 Pain in left toe(s): Secondary | ICD-10-CM

## 2022-05-06 DIAGNOSIS — Z7901 Long term (current) use of anticoagulants: Secondary | ICD-10-CM | POA: Diagnosis not present

## 2022-05-06 DIAGNOSIS — B351 Tinea unguium: Secondary | ICD-10-CM | POA: Diagnosis not present

## 2022-05-06 DIAGNOSIS — M79674 Pain in right toe(s): Secondary | ICD-10-CM | POA: Diagnosis not present

## 2022-05-08 NOTE — Progress Notes (Addendum)
Subjective: Chief Complaint  Patient presents with   Nail Problem    Routine nail care       71 year old female presents the above complaints.  She said the nails are thick and elongated some difficulty trimming.   No swelling redness or any drainage.  No open lesions.  She states the nails do cause discomfort as they are getting elongated.  Objective: AAO x3, NAD DP/PT pulses palpable bilaterally, CRT less than 3 seconds Nails are hypertrophic, dystrophic, brittle, discolored, elongated 10.  Incurvation of the hallux toenails with any signs of infection.  No surrounding redness or drainage. Tenderness nails 1-5 bilaterally.  Incurvation of the hallux and multiple lesser digit nails. No open lesions or pre-ulcerative lesions are identified today. No other areas of tenderness noted bilaterally. No pain with calf compression, swelling, warmth, erythema  Assessment: Symptomatic onychomycosis  Plan: -All treatment options discussed with the patient including all alternatives, risks, complications.  -Sharply debrided the nails x10 without any complications or bleeding -Continue supportive shoe gear, daily foot inspection. -Patient encouraged to call the office with any questions, concerns, change in symptoms.   Vivi Barrack DPM

## 2022-05-09 ENCOUNTER — Other Ambulatory Visit: Payer: Self-pay | Admitting: Interventional Cardiology

## 2022-05-14 ENCOUNTER — Other Ambulatory Visit (HOSPITAL_COMMUNITY): Payer: Medicare Other

## 2022-05-14 DIAGNOSIS — H401131 Primary open-angle glaucoma, bilateral, mild stage: Secondary | ICD-10-CM | POA: Diagnosis not present

## 2022-05-15 ENCOUNTER — Ambulatory Visit (HOSPITAL_COMMUNITY): Payer: Medicare Other | Attending: Physician Assistant

## 2022-05-15 DIAGNOSIS — I5032 Chronic diastolic (congestive) heart failure: Secondary | ICD-10-CM | POA: Insufficient documentation

## 2022-05-15 LAB — ECHOCARDIOGRAM COMPLETE
AR max vel: 2.18 cm2
AV Area VTI: 2.53 cm2
AV Area mean vel: 2.35 cm2
AV Mean grad: 7 mmHg
AV Peak grad: 15.1 mmHg
Ao pk vel: 1.94 m/s
Area-P 1/2: 2.73 cm2
Calc EF: 59.9 %
S' Lateral: 2.7 cm
Single Plane A2C EF: 57.7 %
Single Plane A4C EF: 59.7 %

## 2022-05-20 DIAGNOSIS — I5030 Unspecified diastolic (congestive) heart failure: Secondary | ICD-10-CM | POA: Diagnosis not present

## 2022-05-20 DIAGNOSIS — E7800 Pure hypercholesterolemia, unspecified: Secondary | ICD-10-CM | POA: Diagnosis not present

## 2022-05-20 DIAGNOSIS — I10 Essential (primary) hypertension: Secondary | ICD-10-CM | POA: Diagnosis not present

## 2022-05-20 DIAGNOSIS — G4733 Obstructive sleep apnea (adult) (pediatric): Secondary | ICD-10-CM | POA: Diagnosis not present

## 2022-05-20 DIAGNOSIS — E1169 Type 2 diabetes mellitus with other specified complication: Secondary | ICD-10-CM | POA: Diagnosis not present

## 2022-05-22 ENCOUNTER — Other Ambulatory Visit: Payer: Self-pay | Admitting: *Deleted

## 2022-05-22 DIAGNOSIS — I712 Thoracic aortic aneurysm, without rupture, unspecified: Secondary | ICD-10-CM

## 2022-05-24 ENCOUNTER — Telehealth: Payer: Self-pay | Admitting: *Deleted

## 2022-05-24 ENCOUNTER — Other Ambulatory Visit: Payer: Self-pay | Admitting: Podiatry

## 2022-05-24 MED ORDER — CEPHALEXIN 500 MG PO CAPS: 500.0000 mg | ORAL_CAPSULE | Freq: Three times a day (TID) | ORAL | 0 refills | Status: DC

## 2022-05-24 NOTE — Telephone Encounter (Signed)
Patient is requesting an antibiotic for right back of heel. She had the doctor look at it on last office visit but now has become worse, red all the area, sore, no drainage.  Please advise.

## 2022-05-29 ENCOUNTER — Other Ambulatory Visit: Payer: Self-pay

## 2022-05-29 DIAGNOSIS — K862 Cyst of pancreas: Secondary | ICD-10-CM

## 2022-05-31 DIAGNOSIS — R069 Unspecified abnormalities of breathing: Secondary | ICD-10-CM | POA: Diagnosis not present

## 2022-05-31 DIAGNOSIS — J449 Chronic obstructive pulmonary disease, unspecified: Secondary | ICD-10-CM | POA: Diagnosis not present

## 2022-05-31 DIAGNOSIS — J9601 Acute respiratory failure with hypoxia: Secondary | ICD-10-CM | POA: Diagnosis not present

## 2022-06-08 ENCOUNTER — Ambulatory Visit (HOSPITAL_COMMUNITY)
Admission: RE | Admit: 2022-06-08 | Discharge: 2022-06-08 | Disposition: A | Discharge status: Home / Self Care | Payer: Medicare Other | Source: Ambulatory Visit | Attending: Gastroenterology | Admitting: Gastroenterology

## 2022-06-08 ENCOUNTER — Other Ambulatory Visit: Payer: Self-pay | Admitting: Gastroenterology

## 2022-06-08 DIAGNOSIS — K7689 Other specified diseases of liver: Secondary | ICD-10-CM | POA: Diagnosis not present

## 2022-06-08 DIAGNOSIS — K862 Cyst of pancreas: Secondary | ICD-10-CM

## 2022-06-08 DIAGNOSIS — N289 Disorder of kidney and ureter, unspecified: Secondary | ICD-10-CM | POA: Diagnosis not present

## 2022-06-08 DIAGNOSIS — K5730 Diverticulosis of large intestine without perforation or abscess without bleeding: Secondary | ICD-10-CM | POA: Diagnosis not present

## 2022-06-08 MED ORDER — GADOBUTROL 1 MMOL/ML IV SOLN
9.5000 mL | Freq: Once | INTRAVENOUS | Status: AC | PRN
  Administered 2022-06-08: 9.5 mL via INTRAVENOUS

## 2022-06-10 DIAGNOSIS — E7800 Pure hypercholesterolemia, unspecified: Secondary | ICD-10-CM | POA: Diagnosis not present

## 2022-06-10 DIAGNOSIS — I10 Essential (primary) hypertension: Secondary | ICD-10-CM | POA: Diagnosis not present

## 2022-06-10 DIAGNOSIS — E1169 Type 2 diabetes mellitus with other specified complication: Secondary | ICD-10-CM | POA: Diagnosis not present

## 2022-06-10 DIAGNOSIS — N183 Chronic kidney disease, stage 3 unspecified: Secondary | ICD-10-CM | POA: Diagnosis not present

## 2022-06-10 DIAGNOSIS — I480 Paroxysmal atrial fibrillation: Secondary | ICD-10-CM | POA: Diagnosis not present

## 2022-06-10 DIAGNOSIS — I5030 Unspecified diastolic (congestive) heart failure: Secondary | ICD-10-CM | POA: Diagnosis not present

## 2022-06-12 NOTE — Progress Notes (Signed)
[name],  Your MRI showed that the size of the cyst was smaller than previous examination, which is likely related to the fluid removed from the cyst during the EUS by Dr. Rush Landmark.  The MRI study was limited by motion artifact (movement during the study), impacting the accuracy of the findings. Because of this, it was recommended to continue to monitor the cyst closely with repeat imaging in 3-6 months.   Let's plan for a repeat MRI/MRCP in 4 months.  If the image quality is good and the cyst appears stable, hopefully we can decrease the imaging frequency.

## 2022-06-17 ENCOUNTER — Ambulatory Visit: Payer: Medicare Other | Admitting: Podiatry

## 2022-06-17 DIAGNOSIS — M79675 Pain in left toe(s): Secondary | ICD-10-CM

## 2022-06-17 DIAGNOSIS — B351 Tinea unguium: Secondary | ICD-10-CM

## 2022-06-17 DIAGNOSIS — M79674 Pain in right toe(s): Secondary | ICD-10-CM

## 2022-06-17 DIAGNOSIS — E1169 Type 2 diabetes mellitus with other specified complication: Secondary | ICD-10-CM

## 2022-06-17 DIAGNOSIS — Z7901 Long term (current) use of anticoagulants: Secondary | ICD-10-CM

## 2022-06-19 NOTE — Progress Notes (Signed)
Subjective: Chief Complaint  Patient presents with   Nail Problem    Nail trim      71 year old female presents the above complaints.  She said the nails are thick and elongated some difficulty trimming.   No swelling redness or any drainage.  No open lesions.  She states the nails do cause discomfort as they are getting elongated.  Objective: AAO x3, NAD DP/PT pulses palpable bilaterally, CRT less than 3 seconds Nails are hypertrophic, dystrophic, brittle, discolored, elongated 10.  Incurvation of the hallux toenails with any signs of infection.  No surrounding redness or drainage. Tenderness nails 1-5 bilaterally.  Incurvation of the hallux and multiple lesser digit nails. No open lesions or pre-ulcerative lesions are identified today. No other areas of tenderness noted bilaterally. No pain with calf compression, swelling, warmth, erythema  Assessment: Symptomatic onychomycosis  Plan: -All treatment options discussed with the patient including all alternatives, risks, complications.  -Sharply debrided the nails x10 without any complications or bleeding.  -Continue supportive shoe gear, daily foot inspection. -Patient encouraged to call the office with any questions, concerns, change in symptoms.   Trula Slade DPM

## 2022-06-20 DIAGNOSIS — J9610 Chronic respiratory failure, unspecified whether with hypoxia or hypercapnia: Secondary | ICD-10-CM | POA: Diagnosis not present

## 2022-06-20 DIAGNOSIS — J449 Chronic obstructive pulmonary disease, unspecified: Secondary | ICD-10-CM | POA: Diagnosis not present

## 2022-06-20 DIAGNOSIS — G4733 Obstructive sleep apnea (adult) (pediatric): Secondary | ICD-10-CM | POA: Diagnosis not present

## 2022-06-25 ENCOUNTER — Telehealth: Payer: Self-pay | Admitting: Gastroenterology

## 2022-06-25 NOTE — Telephone Encounter (Signed)
Pt is supposed to have a repeat MRI in 4 months. She is calling to see if she needs to have an OV scheduled since she had the MRI done. Please advise.

## 2022-06-25 NOTE — Telephone Encounter (Signed)
Patient called states she would like to know id she needs to schedule an ov with Dr Candis Schatz since she has her MRI done. Requesting a call bac. Please call to advise.

## 2022-06-27 NOTE — Telephone Encounter (Signed)
Pt aware and knows we will contact her to schedule MRI in Feb closer to the date.

## 2022-06-28 ENCOUNTER — Other Ambulatory Visit (HOSPITAL_COMMUNITY): Payer: Self-pay

## 2022-06-28 MED ORDER — APIXABAN 5 MG PO TABS: 5.0000 mg | ORAL_TABLET | Freq: Two times a day (BID) | ORAL | 0 refills | Status: DC

## 2022-07-01 DIAGNOSIS — J449 Chronic obstructive pulmonary disease, unspecified: Secondary | ICD-10-CM | POA: Diagnosis not present

## 2022-07-01 DIAGNOSIS — J9601 Acute respiratory failure with hypoxia: Secondary | ICD-10-CM | POA: Diagnosis not present

## 2022-07-01 DIAGNOSIS — R069 Unspecified abnormalities of breathing: Secondary | ICD-10-CM | POA: Diagnosis not present

## 2022-07-10 DIAGNOSIS — E1169 Type 2 diabetes mellitus with other specified complication: Secondary | ICD-10-CM | POA: Diagnosis not present

## 2022-07-10 DIAGNOSIS — I5030 Unspecified diastolic (congestive) heart failure: Secondary | ICD-10-CM | POA: Diagnosis not present

## 2022-07-10 DIAGNOSIS — I10 Essential (primary) hypertension: Secondary | ICD-10-CM | POA: Diagnosis not present

## 2022-07-10 DIAGNOSIS — I480 Paroxysmal atrial fibrillation: Secondary | ICD-10-CM | POA: Diagnosis not present

## 2022-07-10 DIAGNOSIS — E7800 Pure hypercholesterolemia, unspecified: Secondary | ICD-10-CM | POA: Diagnosis not present

## 2022-07-10 DIAGNOSIS — N183 Chronic kidney disease, stage 3 unspecified: Secondary | ICD-10-CM | POA: Diagnosis not present

## 2022-07-16 ENCOUNTER — Ambulatory Visit: Payer: Medicare Other | Admitting: Thoracic Surgery (Cardiothoracic Vascular Surgery)

## 2022-07-16 ENCOUNTER — Encounter: Payer: Self-pay | Admitting: Thoracic Surgery (Cardiothoracic Vascular Surgery)

## 2022-07-16 ENCOUNTER — Ambulatory Visit
Admission: RE | Admit: 2022-07-16 | Discharge: 2022-07-16 | Disposition: A | Discharge status: Home / Self Care | Payer: Medicare Other | Source: Ambulatory Visit | Attending: Thoracic Surgery (Cardiothoracic Vascular Surgery) | Admitting: Thoracic Surgery (Cardiothoracic Vascular Surgery)

## 2022-07-16 VITALS — BP 151/87 | HR 87 | Resp 20 | Ht 59.0 in | Wt 196.0 lb

## 2022-07-16 DIAGNOSIS — I712 Thoracic aortic aneurysm, without rupture, unspecified: Secondary | ICD-10-CM

## 2022-07-16 DIAGNOSIS — J8410 Pulmonary fibrosis, unspecified: Secondary | ICD-10-CM | POA: Diagnosis not present

## 2022-07-16 DIAGNOSIS — I2510 Atherosclerotic heart disease of native coronary artery without angina pectoris: Secondary | ICD-10-CM | POA: Diagnosis not present

## 2022-07-16 DIAGNOSIS — J479 Bronchiectasis, uncomplicated: Secondary | ICD-10-CM | POA: Diagnosis not present

## 2022-07-16 DIAGNOSIS — I77810 Thoracic aortic ectasia: Secondary | ICD-10-CM

## 2022-07-16 DIAGNOSIS — I771 Stricture of artery: Secondary | ICD-10-CM | POA: Diagnosis not present

## 2022-07-16 MED ORDER — IOPAMIDOL (ISOVUE-370) INJECTION 76%
75.0000 mL | Freq: Once | INTRAVENOUS | Status: AC | PRN
  Administered 2022-07-16: 75 mL via INTRAVENOUS

## 2022-07-16 NOTE — Progress Notes (Signed)
[address]       [name] [address]             [phone]     HPI: Ms. Graver returns for follow-up of her descending thoracic aneurysms.  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  Her aneurysms were initially found on a CT that was done to evaluate for interstitial lung disease.  She had aneurysms of both the proximal and distal descending aorta.  Her aorta also was elongated and tortuous.  Ascending aorta normal caliber.  She was last seen in the office in May by Jillyn Hidden.  Her aneurysm had increased from 4.6 to 4.8 cm in diameter.  In the interim since that visit she has been feeling well.  She still struggles with her breathing.  That is variable on a day-to-day basis.  She is on oxygen.  No chest pain, pressure, or tightness.  Past Medical History:  Diagnosis Date   Chronic diastolic CHF (congestive heart failure) (HCC)    Chronic respiratory failure (HCC)    Coronary atherosclerosis    a. 2V by CT 10/2017.   Emphysema of lung (HCC)    Hypercholesterolemia    Hypertension    NSIP (nonspecific interstitial pneumonia) (HCC)    Osteoarthritis    Persistent atrial fibrillation (HCC)    Thoracic aortic ectasia (HCC)     Current Outpatient Medications  Medication Sig Dispense Refill   ACCU-CHEK GUIDE test strip See admin instructions.     acetaminophen (TYLENOL) 500 MG tablet Take 500 mg by mouth every 6 (six) hours as needed for moderate pain.     apixaban (ELIQUIS) 5 MG TABS tablet Take 1 tablet (5 mg total) by mouth 2 (two) times daily. 56 tablet 0   ascorbic acid (VITAMIN C) 500 MG tablet Take 500 mg by mouth every other day.     atorvastatin (LIPITOR) 20 MG tablet Take 20 mg by mouth every evening.     brimonidine (ALPHAGAN) 0.2 %  ophthalmic solution Place 1 drop into both eyes 2 (two) times daily.     calcium citrate-vitamin D (CITRACAL+D) 315-200 MG-UNIT tablet Take 1 tablet by mouth daily.     cephALEXin (KEFLEX) 500 MG capsule Take 1 capsule (500 mg total) by mouth 3 (three) times daily. 21 capsule 0   cholecalciferol (VITAMIN D3) 25 MCG (1000 UNIT) tablet Take 1,000 Units by mouth every other day.     clotrimazole-betamethasone (LOTRISONE) cream Apply 1 Application topically 2 (two) times daily.     diltiazem (CARDIZEM CD) 240 MG 24 hr capsule Take 1 capsule (240 mg total) by mouth daily. 90 capsule 3   dofetilide (TIKOSYN) 500 MCG capsule TAKE ONE CAPSULE BY MOUTH TWICE DAILY 60 capsule 6   furosemide (LASIX) 40 MG tablet Take 1 tablet (40 mg total) by mouth daily. MUST SCH FOLLOW UP APPT FOR FURTHER REFILLS 1ST ATTEMPT 30 tablet 0   GINKGO BILOBA COMPLEX PO Take 1 tablet by mouth every other day.     HYDROcodone-acetaminophen (NORCO/VICODIN) 5-325 MG tablet Take 1 tablet by mouth every 6 (six) hours as needed for moderate pain.     irbesartan (AVAPRO) 300 MG tablet Take 300 mg by mouth daily.     LORazepam (ATIVAN) 1 MG tablet Take 1 mg by mouth daily as needed for  anxiety.  1   Multiple Vitamin (MULTIVITAMIN) capsule Take 1 capsule by mouth daily.     Multiple Vitamins-Minerals (HAIR SKIN NAILS PO) Take 1 tablet by mouth every other day.     omeprazole (PRILOSEC) 40 MG capsule Take 1 capsule (40 mg total) by mouth daily. 30 capsule 6   OXYGEN 2lpm 24/7 and 4 lpm with exertion AHC     Oxymetazoline HCl (SINEX ULTRA FINE MIST 12-HOUR NA) Place 1 spray into the nose daily as needed (congestion).     potassium chloride SA (KLOR-CON M) 20 MEQ tablet TAKE ONE TABLET BY MOUTH ONCE DAILY (Patient taking differently: Take 10 mEq by mouth daily.) 90 tablet 3   ROCKLATAN 0.02-0.005 % SOLN Place 1 drop into both eyes at bedtime.     UNABLE TO FIND BIPAP with 2lpm o2 at night  AHC     vitamin B-12 (CYANOCOBALAMIN) 1000 MCG  tablet Take 1,000 mcg by mouth every other day.     No current facility-administered medications for this visit.    Physical Exam BP (!) 151/87 (BP Location: Left Arm, Patient Position: Sitting, Cuff Size: Normal)   Pulse 87   Resp 20   Ht 4\' 11"  (1.499 m)   Wt 196 lb (88.9 kg)   SpO2 92% Comment: 2 LNC  BMI 39.62 kg/m  71 year old woman in no acute distress Alert and oriented x3 Nasal cannula oxygen in place Lungs diminished breath sounds bilaterally Cardiac irregularly irregular  Diagnostic Tests: CT ANGIOGRAPHY CHEST WITH CONTRAST   TECHNIQUE: Multidetector CT imaging of the chest was performed using the standard protocol during bolus administration of intravenous contrast. Multiplanar CT image reconstructions and MIPs were obtained to evaluate the vascular anatomy.   RADIATION DOSE REDUCTION: This exam was performed according to the departmental dose-optimization program which includes automated exposure control, adjustment of the mA and/or kV according to patient size and/or use of iterative reconstruction technique.   CONTRAST:  78mL ISOVUE-370 IOPAMIDOL (ISOVUE-370) INJECTION 76%   COMPARISON:  01/15/2022   FINDINGS: Cardiovascular: Preferential opacification of the thoracic aorta. Tubular ascending thoracic aorta is unchanged measuring up to 3.6 x 3.6 cm. Severe mixed calcific atherosclerosis with multiple small penetrating ulcerations, for example in the left aspect of the aortic arch (series 6, image 83). Tortuous, fusiform aneurysm of the proximal descending thoracic aorta, measuring up to 4.9 x 4.8 cm, not significantly changed (series 9, image 94, series 10, image 109). Additional aneurysm component inferiorly at the level of the diaphragmatic hiatus measuring up to 4.5 x 4.4 cm, unchanged (series 6, image 269). Normal heart size. Left coronary artery calcifications. Enlargement of the main pulmonary artery measuring up to 3.5 cm in caliber. No  pericardial effusion.   Mediastinum/Nodes: No enlarged mediastinal, hilar, or axillary lymph nodes. Thyroid gland, trachea, and esophagus demonstrate no significant findings.   Lungs/Pleura: Unchanged mild pulmonary fibrosis in a pattern with apical to basal gradient, featuring irregular peripheral interstitial opacity, septal thickening, and traction bronchiectasis without clear evidence of subpleural bronchiolectasis or honeycombing. Mild centrilobular and paraseptal emphysema. No pleural effusion or pneumothorax.   Upper Abdomen: No acute abnormality.   Musculoskeletal: No chest wall abnormality. No acute osseous findings.   Review of the MIP images confirms the above findings.   IMPRESSION: 1. Tortuous, fusiform aneurysm of the proximal descending thoracic aorta, measuring up to 4.9 x 4.8 cm, not significantly changed. Additional aneurysm component inferiorly at the level of the diaphragmatic hiatus measuring up to 4.5 x 4.4 cm, unchanged. Recommend  semi-annual imaging followup by CTA or MRA and referral to cardiothoracic surgery if not already obtained. This recommendation follows 2010 ACCF/AHA/AATS/ACR/ASA/SCA/SCAI/SIR/STS/SVM Guidelines for the Diagnosis and Management of Patients With Thoracic Aortic Disease. Circulation. 2010; 121: Z610-R604. Aortic aneurysm NOS (ICD10-I71.9) 2. Severe mixed calcific atherosclerosis with multiple small penetrating ulcerations, for example in the left aspect of the aortic arch. 3. Unchanged mild pulmonary fibrosis in a pattern with apical to basal gradient, featuring irregular peripheral interstitial opacity, septal thickening, and traction bronchiectasis without clear evidence of subpleural bronchiolectasis or honeycombing. This could be further assessed by pulmonary referral and dedicated interstitial lung disease CT if desired. 4. Emphysema. 5. Coronary artery disease.   Aortic Atherosclerosis (ICD10-I70.0) and Emphysema  (ICD10-J43.9).     Electronically Signed   By: Jearld Lesch M.D.   On: 07/16/2022 12:43 I personally reviewed the CT images.  Stable proximal and distal descending aneurysms.  Proximal aneurysm measured 4.8 cm.  Generalized arteriomegaly.  Thoracic aortic atherosclerosis.  Pulmonary fibrosis.  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  Descending thoracic aneurysms/thoracic aortic atherosclerosis/penetrating atherosclerotic ulcer-stable.  Needs continued semiannual follow-up.  Importance of blood pressure control emphasized.  She is on multiple blood pressure medications.  Hypertension-blood pressure elevated at 151 systolic today.  She does not check her self on a regular basis at home.  I encouraged her to check her self on a regular basis and write down her numbers so that this can be reviewed.  She is already on multiple blood pressure medications I do not want to start a new medication based on a single reading.  She will track that and follow-up with Dr. Tiburcio Pea.  ILD-followed by Dr. Sherene Sires.  Plan: Return in 6 months with CT angio of chest  Loreli Slot, MD [facility] [phone]

## 2022-07-17 ENCOUNTER — Telehealth: Payer: Self-pay

## 2022-07-17 NOTE — Telephone Encounter (Signed)
Contacted patient to schedule hospital Colonoscopy. Patient stated that she has decided not to proceed with Colonoscopy.

## 2022-07-25 ENCOUNTER — Ambulatory Visit: Payer: Medicare Other | Attending: Physician Assistant

## 2022-07-25 DIAGNOSIS — I5032 Chronic diastolic (congestive) heart failure: Secondary | ICD-10-CM

## 2022-07-25 DIAGNOSIS — I10 Essential (primary) hypertension: Secondary | ICD-10-CM

## 2022-07-25 DIAGNOSIS — E785 Hyperlipidemia, unspecified: Secondary | ICD-10-CM

## 2022-07-25 DIAGNOSIS — I2510 Atherosclerotic heart disease of native coronary artery without angina pectoris: Secondary | ICD-10-CM | POA: Diagnosis not present

## 2022-07-25 DIAGNOSIS — I4819 Other persistent atrial fibrillation: Secondary | ICD-10-CM

## 2022-07-25 DIAGNOSIS — I2584 Coronary atherosclerosis due to calcified coronary lesion: Secondary | ICD-10-CM | POA: Diagnosis not present

## 2022-07-25 LAB — HEPATIC FUNCTION PANEL
ALT: 11 IU/L (ref 0–32)
AST: 16 IU/L (ref 0–40)
Albumin: 4.6 g/dL (ref 3.8–4.8)
Alkaline Phosphatase: 116 IU/L (ref 44–121)
Bilirubin Total: 0.5 mg/dL (ref 0.0–1.2)
Bilirubin, Direct: 0.16 mg/dL (ref 0.00–0.40)
Total Protein: 7.5 g/dL (ref 6.0–8.5)

## 2022-07-25 LAB — LIPID PANEL
Chol/HDL Ratio: 2.6 ratio (ref 0.0–4.4)
Cholesterol, Total: 161 mg/dL (ref 100–199)
HDL: 62 mg/dL (ref >39)
LDL Chol Calc (NIH): 85 mg/dL (ref 0–99)
Triglycerides: 75 mg/dL (ref 0–149)
VLDL Cholesterol Cal: 14 mg/dL (ref 5–40)

## 2022-07-29 ENCOUNTER — Telehealth: Payer: Self-pay | Admitting: Interventional Cardiology

## 2022-07-29 ENCOUNTER — Ambulatory Visit: Payer: Medicare Other | Admitting: Podiatry

## 2022-07-29 DIAGNOSIS — M79675 Pain in left toe(s): Secondary | ICD-10-CM | POA: Diagnosis not present

## 2022-07-29 DIAGNOSIS — M79674 Pain in right toe(s): Secondary | ICD-10-CM | POA: Diagnosis not present

## 2022-07-29 DIAGNOSIS — Z7901 Long term (current) use of anticoagulants: Secondary | ICD-10-CM | POA: Diagnosis not present

## 2022-07-29 DIAGNOSIS — B351 Tinea unguium: Secondary | ICD-10-CM

## 2022-07-29 NOTE — Telephone Encounter (Signed)
Pt is requesting call back to discuss message she received in her MyChart in regards to results.

## 2022-07-29 NOTE — Telephone Encounter (Signed)
Ms. Bambach,   Your triglycerides are much better at 75. I would like to however get your LDL or bad cholesterol down to 70 or below. Would you be willing to increase your cholesterol medication?   Thanks! Sharlene Dory, PA-C  Patient calling regarding above message.  She is agreeable to increasing atorvastatin.  Pharmacy is [facility]

## 2022-07-30 ENCOUNTER — Other Ambulatory Visit: Payer: Self-pay | Admitting: *Deleted

## 2022-07-30 DIAGNOSIS — I2510 Atherosclerotic heart disease of native coronary artery without angina pectoris: Secondary | ICD-10-CM

## 2022-07-30 DIAGNOSIS — I5032 Chronic diastolic (congestive) heart failure: Secondary | ICD-10-CM

## 2022-07-30 DIAGNOSIS — E785 Hyperlipidemia, unspecified: Secondary | ICD-10-CM

## 2022-07-30 MED ORDER — ATORVASTATIN CALCIUM 40 MG PO TABS: 40.0000 mg | ORAL_TABLET | Freq: Every day | ORAL | 11 refills | Status: DC

## 2022-07-30 NOTE — Telephone Encounter (Signed)
Spoke with patient and informed her of recommendation per Tessa to increase lipitor to 40mg  daily and get repeat LFTs and lipid panel in 8 weeks. [facility] and patient will come in for labs on 09/30/22. Patient states her understanding and agrees with this plan.

## 2022-07-31 DIAGNOSIS — G4733 Obstructive sleep apnea (adult) (pediatric): Secondary | ICD-10-CM | POA: Diagnosis not present

## 2022-07-31 DIAGNOSIS — J9601 Acute respiratory failure with hypoxia: Secondary | ICD-10-CM | POA: Diagnosis not present

## 2022-07-31 DIAGNOSIS — J449 Chronic obstructive pulmonary disease, unspecified: Secondary | ICD-10-CM | POA: Diagnosis not present

## 2022-07-31 DIAGNOSIS — R069 Unspecified abnormalities of breathing: Secondary | ICD-10-CM | POA: Diagnosis not present

## 2022-07-31 NOTE — Progress Notes (Signed)
Subjective: Chief Complaint  Patient presents with   Nail Problem    Routine foot care, nail trim      71 year old female presents the above complaints.  She said the nails are thick and elongated some difficulty trimming.  No swelling or drainage.  No open lesions.  No other concerns.  Objective: AAO x3, NAD DP/PT pulses palpable bilaterally, CRT less than 3 seconds Nails are hypertrophic, dystrophic, brittle, discolored, elongated 10.  Incurvation of the hallux toenails with any signs of infection.  No surrounding redness or drainage. Tenderness nails 1-5 bilaterally.  Incurvation of the hallux and multiple lesser digit nails. No open lesions or pre-ulcerative lesions are identified today. No pain with calf compression, swelling, warmth, erythema  Assessment: Symptomatic onychomycosis  Plan: -All treatment options discussed with the patient including all alternatives, risks, complications.  -Sharply debrided the nails x10 without any complications or bleeding.  -Continue supportive shoe gear, daily foot inspection. -Patient encouraged to call the office with any questions, concerns, change in symptoms.   Vivi Barrack DPM

## 2022-08-05 ENCOUNTER — Other Ambulatory Visit (HOSPITAL_COMMUNITY): Payer: Self-pay | Admitting: Nurse Practitioner

## 2022-08-06 DIAGNOSIS — I2510 Atherosclerotic heart disease of native coronary artery without angina pectoris: Secondary | ICD-10-CM | POA: Diagnosis not present

## 2022-08-06 DIAGNOSIS — I10 Essential (primary) hypertension: Secondary | ICD-10-CM | POA: Diagnosis not present

## 2022-08-06 DIAGNOSIS — E7800 Pure hypercholesterolemia, unspecified: Secondary | ICD-10-CM | POA: Diagnosis not present

## 2022-08-06 DIAGNOSIS — E1169 Type 2 diabetes mellitus with other specified complication: Secondary | ICD-10-CM | POA: Diagnosis not present

## 2022-08-15 ENCOUNTER — Ambulatory Visit (HOSPITAL_COMMUNITY): Payer: Medicare Other | Admitting: Nurse Practitioner

## 2022-08-20 DIAGNOSIS — J9610 Chronic respiratory failure, unspecified whether with hypoxia or hypercapnia: Secondary | ICD-10-CM | POA: Diagnosis not present

## 2022-08-20 DIAGNOSIS — G4733 Obstructive sleep apnea (adult) (pediatric): Secondary | ICD-10-CM | POA: Diagnosis not present

## 2022-08-20 DIAGNOSIS — J449 Chronic obstructive pulmonary disease, unspecified: Secondary | ICD-10-CM | POA: Diagnosis not present

## 2022-08-21 ENCOUNTER — Encounter (HOSPITAL_COMMUNITY): Payer: Self-pay | Admitting: Nurse Practitioner

## 2022-08-21 ENCOUNTER — Ambulatory Visit (HOSPITAL_COMMUNITY)
Admission: RE | Admit: 2022-08-21 | Discharge: 2022-08-21 | Disposition: A | Discharge status: Home / Self Care | Payer: Medicare Other | Source: Ambulatory Visit | Attending: Nurse Practitioner | Admitting: Nurse Practitioner

## 2022-08-21 VITALS — BP 150/84 | HR 65 | Ht 59.0 in | Wt 196.6 lb

## 2022-08-21 DIAGNOSIS — I4819 Other persistent atrial fibrillation: Secondary | ICD-10-CM | POA: Insufficient documentation

## 2022-08-21 DIAGNOSIS — I110 Hypertensive heart disease with heart failure: Secondary | ICD-10-CM | POA: Insufficient documentation

## 2022-08-21 DIAGNOSIS — D6869 Other thrombophilia: Secondary | ICD-10-CM

## 2022-08-21 DIAGNOSIS — I5032 Chronic diastolic (congestive) heart failure: Secondary | ICD-10-CM | POA: Insufficient documentation

## 2022-08-21 DIAGNOSIS — I219 Acute myocardial infarction, unspecified: Secondary | ICD-10-CM | POA: Diagnosis not present

## 2022-08-21 DIAGNOSIS — Z7901 Long term (current) use of anticoagulants: Secondary | ICD-10-CM | POA: Insufficient documentation

## 2022-08-21 DIAGNOSIS — J8410 Pulmonary fibrosis, unspecified: Secondary | ICD-10-CM | POA: Diagnosis not present

## 2022-08-21 DIAGNOSIS — E119 Type 2 diabetes mellitus without complications: Secondary | ICD-10-CM | POA: Insufficient documentation

## 2022-08-21 LAB — BASIC METABOLIC PANEL
Anion gap: 7 (ref 5–15)
BUN: 25 mg/dL — ABNORMAL HIGH (ref 8–23)
CO2: 31 mmol/L (ref 22–32)
Calcium: 9.7 mg/dL (ref 8.9–10.3)
Chloride: 105 mmol/L (ref 98–111)
Creatinine, Ser: 1.04 mg/dL — ABNORMAL HIGH (ref 0.44–1.00)
GFR, Estimated: 57 mL/min — ABNORMAL LOW (ref >60)
Glucose, Bld: 143 mg/dL — ABNORMAL HIGH (ref 70–99)
Potassium: 4.3 mmol/L (ref 3.5–5.1)
Sodium: 143 mmol/L (ref 135–145)

## 2022-08-21 LAB — MAGNESIUM: Magnesium: 2.4 mg/dL (ref 1.7–2.4)

## 2022-08-21 NOTE — Progress Notes (Signed)
Date:  02/14/2022   ID:  [name], DOB Jun 20, 1951, MRN [id number]  Location:  In office  Provider location: [address] [place], [place] [address] Evaluation Performed:  Follow up   PCP:  Toni Blamer, MD  Primary Cardiologist:  Dr. Eldridge Warner Primary Electrophysiologist: Dr. Johney Warner   CC: afib/tikosyn surveillance   History of Present Illness: [name] is a 71 y.o. female who presents for a f/u tikosyn  visit today.   Pt reports that she is doing well with only 2 short afib episodes last month. otherwise staying in SR, and remains compliant with Tikosyn and  Eliquis. CHA2DS2VASc Score of 3. No bleeding issues. She is feeling more short of breath, h/o pulmonary fibrosis and she is wanting to move up her appointment with him,continues on O2 via nasal cannula.    F/u in afib clinic, 08/16/21 for Tikosyn surveillance. She  reports no afib. She is being compliant with Tikosyn and eliquis. Qt stable.   F/u in afib clinic for Tikosyn surveillance, 02/14/22. She has noted her fit bit at night occasionally will show irregular rhythm but HR's are not elevated and she does not feel any different. Her watch does not have capacity to run ekg's, so I am deducting this may be palpitations but no evidence for afib. Otherwise, no complaints today. She states her lungs are at baseline. Qt stable.   F/u in afib clinic, 12/23, for Tikosyn surveillance, she had some afib back in October but it self corrected. No other complaints. Qt stable.   Today, she denies symptoms of palpitations, chest pain, orthopnea, PND, lower extremity edema, claudication, dizziness, presyncope, syncope, bleeding, or neurologic sequela.+shortness of breath. The patient is tolerating medications without difficulties and is otherwise without complaint today.   she denies symptoms of cough, fevers, chills, or new SOB worrisome for COVID 19.    Atrial Fibrillation Risk Factors:  she does have symptoms or  diagnosis of sleep apnea. she is compliant with BiPAP therapy. she does not have a history of rheumatic fever. she does not have a history of alcohol use. The patient does not have a history of early familial atrial fibrillation or other arrhythmias.  she has a BMI of Body mass index is 42.29 kg/m.Marland Kitchen Filed Weights   02/14/22 0957  Weight: 95 kg    Past Medical History:  Diagnosis Date   Chronic diastolic CHF (congestive heart failure) (HCC)    Chronic respiratory failure (HCC)    Coronary atherosclerosis    a. 2V by CT 10/2017.   Emphysema of lung (HCC)    Hypercholesterolemia    Hypertension    NSIP (nonspecific interstitial pneumonia) (HCC)    Osteoarthritis    Persistent atrial fibrillation (HCC)    Thoracic aortic ectasia [facility])    Past Surgical History:  Procedure Laterality Date   BIOPSY  12/10/2021   Procedure: BIOPSY;  Surgeon: [name]., MD;  Location: [facility] ENDOSCOPY;  Service: Gastroenterology;;   CARDIOVERSION N/A 07/25/2016   Procedure: CARDIOVERSION;  Surgeon: Toni Riffle, MD;  Location: [facility] ENDOSCOPY;  Service: Cardiovascular;  Laterality: N/A;   CARDIOVERSION N/A 09/14/2016   Procedure: CARDIOVERSION;  Surgeon: Hillis Range, MD;  Location: [facility] OR;  Service: Cardiovascular;  Laterality: N/A;   CATARACT EXTRACTION Bilateral    ESOPHAGOGASTRODUODENOSCOPY (EGD) WITH PROPOFOL N/A 12/10/2021   Procedure: ESOPHAGOGASTRODUODENOSCOPY (EGD) WITH PROPOFOL;  Surgeon: [name]., MD;  Location: [facility] ENDOSCOPY;  Service: Gastroenterology;  Laterality: N/A;  EUS N/A 12/10/2021   Procedure: UPPER ENDOSCOPIC ULTRASOUND (EUS) RADIAL;  Surgeon: [name]., MD;  Location: [facility] ENDOSCOPY;  Service: Gastroenterology;  Laterality: N/A;   FINE NEEDLE ASPIRATION  12/10/2021   Procedure: FINE NEEDLE ASPIRATION;  Surgeon: [name]., MD;  Location: [facility] ENDOSCOPY;  Service: Gastroenterology;;   LEG SURGERY Right    TEE WITHOUT CARDIOVERSION N/A  07/25/2016   Procedure: TRANSESOPHAGEAL ECHOCARDIOGRAM (TEE);  Surgeon: Toni Riffle, MD;  Location: [facility] ENDOSCOPY;  Service: Cardiovascular;  Laterality: N/A;     Current Outpatient Medications  Medication Sig Dispense Refill   ACCU-CHEK GUIDE test strip See admin instructions.     acetaminophen (TYLENOL) 500 MG tablet Take 500 mg by mouth every 6 (six) hours as needed for moderate pain.     apixaban (ELIQUIS) 5 MG TABS tablet Take 1 tablet (5 mg total) by mouth 2 (two) times daily. 56 tablet 0   ascorbic acid (VITAMIN C) 500 MG tablet Take 500 mg by mouth every other day.     atorvastatin (LIPITOR) 20 MG tablet Take 20 mg by mouth every evening.     brimonidine (ALPHAGAN) 0.2 % ophthalmic solution Place 1 drop into both eyes 2 (two) times daily.     calcium citrate-vitamin D (CITRACAL+D) 315-200 MG-UNIT tablet Take 1 tablet by mouth daily.     cholecalciferol (VITAMIN D3) 25 MCG (1000 UNIT) tablet Take 1,000 Units by mouth every other day.     diltiazem (CARDIZEM CD) 240 MG 24 hr capsule TAKE ONE CAPSULE BY MOUTH ONCE DAILY (Patient taking differently: Take 240 mg by mouth daily.) 90 capsule 3   dofetilide (TIKOSYN) 500 MCG capsule TAKE ONE CAPSULE BY MOUTH TWICE DAILY 60 capsule 6   furosemide (LASIX) 40 MG tablet Take 1 tablet (40 mg total) by mouth daily. MUST SCH FOLLOW UP APPT FOR FURTHER REFILLS 1ST ATTEMPT (Patient taking differently: Take 40 mg by mouth daily.) 30 tablet 0   GINKGO BILOBA COMPLEX PO Take 1 tablet by mouth every other day.     HYDROcodone-acetaminophen (NORCO/VICODIN) 5-325 MG tablet Take 1 tablet by mouth every 6 (six) hours as needed for moderate pain.     irbesartan (AVAPRO) 300 MG tablet Take 300 mg by mouth daily.     LORazepam (ATIVAN) 1 MG tablet Take 1 mg by mouth daily as needed for anxiety.  1   Multiple Vitamin (MULTIVITAMIN) capsule Take 1 capsule by mouth daily.     Multiple Vitamins-Minerals (HAIR SKIN NAILS PO) Take 1 tablet by mouth every other day.      omeprazole (PRILOSEC) 40 MG capsule Take 1 capsule (40 mg total) by mouth daily. 30 capsule 6   OXYGEN 2lpm 24/7 and 4 lpm with exertion AHC     Oxymetazoline HCl (SINEX ULTRA FINE MIST 12-HOUR NA) Place 1 spray into the nose daily as needed (congestion).     potassium chloride SA (KLOR-CON M) 20 MEQ tablet TAKE ONE TABLET BY MOUTH ONCE DAILY 90 tablet 3   ROCKLATAN 0.02-0.005 % SOLN Place 1 drop into both eyes at bedtime.     UNABLE TO FIND BIPAP with 2lpm o2 at night  AHC     vitamin B-12 (CYANOCOBALAMIN) 1000 MCG tablet Take 1,000 mcg by mouth every other day.     No current facility-administered medications for this encounter.    Allergies:   Oxycodone hcl, Levaquin [levofloxacin in d5w], and Percocet [oxycodone-acetaminophen]   Social History:  The patient  reports that she quit smoking  about 11 years ago. Her smoking use included cigarettes. She started smoking about 55 years ago. She has a 43.00 pack-year smoking history. She has never used smokeless tobacco. She reports that she does not drink alcohol and does not use drugs.   Family History:  The patient's  family history includes Heart attack in her sister; Heart disease in her father.    ROS:  Please see the history of present illness.   All other systems are personally reviewed and negative.   Exam: GEN- The patient is well appearing, alert and oriented x 3 today.   Head- normocephalic, atraumatic Eyes-  Sclera clear, conjunctiva pink Ears- hearing intact Oropharynx- clear Neck- supple, no JVP Lymph- no cervical lymphadenopathy Lungs- Clear to ausculation bilaterally, normal work of breathing Heart- Regular rate and rhythm, no murmurs, rubs or gallops, PMI not laterally displaced GI- soft, NT, ND, + BS Extremities- no clubbing, cyanosis, or 1t pitting edema( improved) MS- no significant deformity or atrophy Skin- no rash or lesion Psych- euthymic mood, full affect Neuro- strength and sensation are intact        Recent Labs: 08/16/2021: Magnesium 2.2 08/31/2021: BUN 17; Creatinine, Ser 0.93; Potassium 4.2; Sodium 140 09/12/2021: ALT 15  personally reviewed    Other studies personally reviewed:   Ekg-Vent. rate 66 BPM PR interval 158 ms QRS duration 80 ms QT/QTcB 408/427 ms P-R-T axes 60 -21 34 Normal sinus rhythm Normal ECG When compared with ECG of 16-Aug-2021 11:28, PREVIOUS ECG IS PRESENT      ASSESSMENT AND PLAN:  1.  H/o persisitent atrial fibrillation Doing well maintaining SR on Tikosyn 500 mcg bid Continue Cardizem 240 mg a day Continue  eliquis 5 mg bid This patients CHA2DS2-VASc Score and unadjusted Ischemic Stroke Rate (% per year) is equal to 3.2 % stroke rate/year from a score of 3  Above score calculated as 1 point each if present [CHF, HTN, DM, Vascular=MI/PAD/Aortic Plaque, Age if 65-74, or Female] Above score calculated as 2 points each if present [Age > 75, or Stroke/TIA   2. Shortness of breath  H/o interstitial lung disease  Wearing O2 via nasal cannula  Bmet/mag today  Qtc stable    Labs/ tests ordered today include: none Orders Placed This Encounter  Procedures   Basic metabolic panel   Magnesium   EKG 12-Lead   2. Shortness of breath  H/o interstitial lung disease  Wearing O2 via nasal cannula   F/u in 6 months   Signed, [name] NP 02/14/2022 10:55 AM  [facility] [address] [place], [place] [address] [phone]

## 2022-08-31 DIAGNOSIS — R069 Unspecified abnormalities of breathing: Secondary | ICD-10-CM | POA: Diagnosis not present

## 2022-08-31 DIAGNOSIS — G4733 Obstructive sleep apnea (adult) (pediatric): Secondary | ICD-10-CM | POA: Diagnosis not present

## 2022-08-31 DIAGNOSIS — J449 Chronic obstructive pulmonary disease, unspecified: Secondary | ICD-10-CM | POA: Diagnosis not present

## 2022-08-31 DIAGNOSIS — J9601 Acute respiratory failure with hypoxia: Secondary | ICD-10-CM | POA: Diagnosis not present

## 2022-09-02 DIAGNOSIS — J9610 Chronic respiratory failure, unspecified whether with hypoxia or hypercapnia: Secondary | ICD-10-CM | POA: Diagnosis not present

## 2022-09-02 DIAGNOSIS — G4733 Obstructive sleep apnea (adult) (pediatric): Secondary | ICD-10-CM | POA: Diagnosis not present

## 2022-09-10 ENCOUNTER — Ambulatory Visit: Payer: Medicare Other | Admitting: Podiatry

## 2022-09-10 DIAGNOSIS — I5032 Chronic diastolic (congestive) heart failure: Secondary | ICD-10-CM | POA: Diagnosis not present

## 2022-09-10 DIAGNOSIS — I10 Essential (primary) hypertension: Secondary | ICD-10-CM | POA: Diagnosis not present

## 2022-09-10 DIAGNOSIS — M810 Age-related osteoporosis without current pathological fracture: Secondary | ICD-10-CM | POA: Diagnosis not present

## 2022-09-10 DIAGNOSIS — E1169 Type 2 diabetes mellitus with other specified complication: Secondary | ICD-10-CM | POA: Diagnosis not present

## 2022-09-10 DIAGNOSIS — N183 Chronic kidney disease, stage 3 unspecified: Secondary | ICD-10-CM | POA: Diagnosis not present

## 2022-09-10 DIAGNOSIS — I2510 Atherosclerotic heart disease of native coronary artery without angina pectoris: Secondary | ICD-10-CM | POA: Diagnosis not present

## 2022-09-10 DIAGNOSIS — E7800 Pure hypercholesterolemia, unspecified: Secondary | ICD-10-CM | POA: Diagnosis not present

## 2022-09-18 DIAGNOSIS — G4733 Obstructive sleep apnea (adult) (pediatric): Secondary | ICD-10-CM | POA: Diagnosis not present

## 2022-09-18 DIAGNOSIS — I480 Paroxysmal atrial fibrillation: Secondary | ICD-10-CM | POA: Diagnosis not present

## 2022-09-18 DIAGNOSIS — J9612 Chronic respiratory failure with hypercapnia: Secondary | ICD-10-CM | POA: Diagnosis not present

## 2022-09-18 DIAGNOSIS — I5032 Chronic diastolic (congestive) heart failure: Secondary | ICD-10-CM | POA: Diagnosis not present

## 2022-09-18 DIAGNOSIS — I712 Thoracic aortic aneurysm, without rupture, unspecified: Secondary | ICD-10-CM | POA: Diagnosis not present

## 2022-09-18 DIAGNOSIS — N183 Chronic kidney disease, stage 3 unspecified: Secondary | ICD-10-CM | POA: Diagnosis not present

## 2022-09-19 DIAGNOSIS — J449 Chronic obstructive pulmonary disease, unspecified: Secondary | ICD-10-CM | POA: Diagnosis not present

## 2022-09-19 DIAGNOSIS — J9610 Chronic respiratory failure, unspecified whether with hypoxia or hypercapnia: Secondary | ICD-10-CM | POA: Diagnosis not present

## 2022-09-19 DIAGNOSIS — G4733 Obstructive sleep apnea (adult) (pediatric): Secondary | ICD-10-CM | POA: Diagnosis not present

## 2022-09-23 ENCOUNTER — Ambulatory Visit: Payer: Medicare Other | Admitting: Podiatry

## 2022-09-23 ENCOUNTER — Encounter: Payer: Self-pay | Admitting: Podiatry

## 2022-09-23 DIAGNOSIS — L600 Ingrowing nail: Secondary | ICD-10-CM

## 2022-09-23 NOTE — Patient Instructions (Signed)

## 2022-09-24 NOTE — Progress Notes (Signed)
Subjective:   Patient ID: [name], female   DOB: 72 y.o.   MRN: [id number]   HPI Patient presents with chronic ingrown toenails of the big toes of both feet that are increasingly sore and did not do well when trimmed 6 weeks ago.  Patient does have poor health is on oxygen   ROS      Objective:  Physical Exam  Neurovascular status unchanged from previous visits with pulses that are active but probably diminished oxygen perfusion with incurvated hallux nails bilateral both medial lateral borders and other nails that do have some incurvation     Assessment:  Chronic ingrown toenail deformity hallux bilateral with pain no indication of infection with nail disease with also health issues and probable low-grade circulatory issues     Plan:  H&P reviewed and anesthetized each big toe 60 mg like Marcaine mixture and using sterile instrumentation remove the medial lateral borders in a slant direction flushed the area is no pus was noted applied sterile dressings instructed on soaks and debrided remaining nails courtesy.  Reappoint to recheck as needed

## 2022-09-27 ENCOUNTER — Other Ambulatory Visit (HOSPITAL_COMMUNITY): Payer: Self-pay

## 2022-09-27 DIAGNOSIS — J8410 Pulmonary fibrosis, unspecified: Secondary | ICD-10-CM | POA: Diagnosis not present

## 2022-09-27 DIAGNOSIS — I110 Hypertensive heart disease with heart failure: Secondary | ICD-10-CM | POA: Diagnosis not present

## 2022-09-27 DIAGNOSIS — E1169 Type 2 diabetes mellitus with other specified complication: Secondary | ICD-10-CM | POA: Diagnosis not present

## 2022-09-27 DIAGNOSIS — I712 Thoracic aortic aneurysm, without rupture, unspecified: Secondary | ICD-10-CM | POA: Diagnosis not present

## 2022-09-27 DIAGNOSIS — E119 Type 2 diabetes mellitus without complications: Secondary | ICD-10-CM | POA: Diagnosis not present

## 2022-09-27 DIAGNOSIS — I480 Paroxysmal atrial fibrillation: Secondary | ICD-10-CM | POA: Diagnosis not present

## 2022-09-27 DIAGNOSIS — G4733 Obstructive sleep apnea (adult) (pediatric): Secondary | ICD-10-CM | POA: Diagnosis not present

## 2022-09-27 DIAGNOSIS — I5032 Chronic diastolic (congestive) heart failure: Secondary | ICD-10-CM | POA: Diagnosis not present

## 2022-09-27 DIAGNOSIS — I10 Essential (primary) hypertension: Secondary | ICD-10-CM | POA: Diagnosis not present

## 2022-09-27 MED ORDER — APIXABAN 5 MG PO TABS: 5.0000 mg | ORAL_TABLET | Freq: Two times a day (BID) | ORAL | 0 refills | Status: DC

## 2022-09-30 DIAGNOSIS — D485 Neoplasm of uncertain behavior of skin: Secondary | ICD-10-CM | POA: Diagnosis not present

## 2022-09-30 DIAGNOSIS — L918 Other hypertrophic disorders of the skin: Secondary | ICD-10-CM | POA: Diagnosis not present

## 2022-10-01 ENCOUNTER — Other Ambulatory Visit: Payer: Medicare Other

## 2022-10-01 DIAGNOSIS — G4733 Obstructive sleep apnea (adult) (pediatric): Secondary | ICD-10-CM | POA: Diagnosis not present

## 2022-10-01 DIAGNOSIS — R069 Unspecified abnormalities of breathing: Secondary | ICD-10-CM | POA: Diagnosis not present

## 2022-10-01 DIAGNOSIS — J9601 Acute respiratory failure with hypoxia: Secondary | ICD-10-CM | POA: Diagnosis not present

## 2022-10-01 DIAGNOSIS — J449 Chronic obstructive pulmonary disease, unspecified: Secondary | ICD-10-CM | POA: Diagnosis not present

## 2022-10-02 NOTE — Progress Notes (Unsigned)
Subjective:     Patient ID: [name], female   DOB: 1951-01-26    MRN: [id number]    Brief patient profile:  66   yowf quit smoking 2012 due to some cough/wheezing improved p quit at wt 180 and no obst on spirometry 06/25/16  referred to pulmonary clinic 06/25/2016 by Dr   Shirline Frees s/p admit:   Admit date: 02/05/2016 Discharge date: 02/08/2016    Discharge Diagnoses:  Principal Problem:   Acute respiratory failure with hypoxia ([place])   HTN (hypertension)   PNA (pneumonia)   Glaucoma    History of Present Illness  06/25/2016 1st [facility] office visit/ Teresea Donley   ? NSIP  Chief Complaint  Patient presents with   Consult visit    COPD consult per [name]- Pt states SOB w/ excertion. No congestion or tightness. Occasional coughing at times.   onset of sob spring 2017 x one month prior to admit on 02/05/16 gradually worse while on lisinopril  In April 2017 was using [facility] parking but still able to do [employer]  Now sob across the room  Sob occurs at rest if speaking  Able to lie down rotated on to left side otherwise chokes/ smothers rec Stop lotensin and start ibesartan 150 mg one daily  And your breathing should gradually improve  Please schedule a follow up office visit in 2 weeks, sooner if needed to see [name] to recheck your blood pressure  Add double the lasix dose for now    10/29/2016 acute extended ov/Jonathen Rathman re: new onset resp failure/ pt maint on eliquis but no rsp rx  Chief Complaint  Patient presents with   Acute Visit    Pt c/o increased SOB and lower o2 sats for the past wk.   gradually worse sob x one week assoc with low 02 sats though baseline doe = MMRC3 = can't walk 100 yards even at a slow pace at a flat grade s stopping due to sob   No sign cough/ no chills no cp or vomiting  Leg swelling no worse than usual  rec Please see patient coordinator before you leave today  to schedule start 02 24/7 at 2lpm and ambulatory 02 titration to see  if eligible for POC  Wear 02 2lpm 24/7 for now at 2lpm and increase to 4lpm with exertion Use your incentive spirometer as much as you can  Please remember to go to the lab and x-ray department downstairs in the basement  for your tests - we will call you with the results when they are available. I will arrange follow up for you here if indicated, otherwise just return to cardiology as planned  Late add:  Needs hrct next p taking one extra lasix daily x one week > NSIP    11/18/2017  f/u ov/Kingston Shawgo re:  ?NSIP last bird exp 2-3 years  02 2lpm bipap and 2lpm with activity  Chief Complaint  Patient presents with   Follow-up    PFT's done today. Breathing is unchanged.   Dyspnea:  Still shopping at [employer]/ wearing 2lpm with activity  Cough: none Sleep: sleep ok on cpap/ 02 SABA use:  n/a Roderic Palau  Now following for Cards rec Avoid bird exposure  I will let Roderic Palau know about your Aortic enlargement but the main treatment is to lose weight and keep your blood pressure down       04/02/2022  f/u ov/Uzair Godley re: NSIP ?  Related to bird exposure   maint on 02 only /  CAF   Chief Complaint  Patient presents with   Follow-up    No new issues since LOV.  Dyspnea:  most of her shopping is [employer] /pushing cart on 3lpm 3lpm not checking sats  Cough: minimal mucoid worse in am  Sleeping: bipap and 02 2lpm  SABA use: none  02: 2lpm at rest up to 3 with activity  Covid status:   never vax Rec Make sure you check your oxygen saturation  AT  your highest level of activity (not after you stop)   to be sure it stays over 90%      10/03/2022  6 m f/u ov/Elanie Hammitt re:  NSIP ? Related to bird exposure around 2017    maint on 02 only /  PAF      Chief Complaint  Patient presents with   Follow-up    Doing well.  Dyspnea:  doing up to 10 min per day up ramp and across the house on 2lpm with sats around 90s  Mb and back qod 100 ft flat on 02 s stopping  Cough: none  Sleeping: bipap and 2lpm  and  sleeps fine  SABA use: none  02: 2-3lpm -  occ at rest takes it off and stays high 80s RA Covid status:   vax never      No obvious day to day or daytime variability or assoc excess/ purulent sputum or mucus plugs or hemoptysis or cp or chest tightness, subjective wheeze or overt sinus or hb symptoms.   Sleeping well as above without nocturnal  or early am exacerbation  of respiratory  c/o's or need for noct saba. Also denies any obvious fluctuation of symptoms with weather or environmental changes or other aggravating or alleviating factors except as outlined above   No unusual exposure hx or h/o childhood pna/ asthma or knowledge of premature birth.  Current Allergies, Complete Past Medical History, Past Surgical History, Family History, and Social History were reviewed in [employer] record.  ROS  The following are not active complaints unless bolded Hoarseness, sore throat, dysphagia, dental problems, itching, sneezing,  nasal congestion or discharge of excess mucus or purulent secretions, ear ache,   fever, chills, sweats, unintended wt loss or wt gain, classically pleuritic or exertional cp,  orthopnea pnd or arm/hand swelling  or leg swelling, presyncope, palpitations, abdominal pain, anorexia, nausea, vomiting, diarrhea  or change in bowel habits or change in bladder habits, change in stools or change in urine, dysuria, hematuria,  rash, arthralgias, visual complaints, headache, numbness, weakness or ataxia or problems with walking or coordination,  change in mood or  memory.        Current Meds  Medication Sig   ACCU-CHEK GUIDE test strip See admin instructions.   acetaminophen (TYLENOL) 500 MG tablet Take 500 mg by mouth every 6 (six) hours as needed for moderate pain.   apixaban (ELIQUIS) 5 MG TABS tablet Take 1 tablet (5 mg total) by mouth 2 (two) times daily.   ascorbic acid (VITAMIN C) 500 MG tablet Take 500 mg by mouth every other day.   atorvastatin  (LIPITOR) 40 MG tablet Take 1 tablet (40 mg total) by mouth daily.   brimonidine (ALPHAGAN) 0.2 % ophthalmic solution Place 1 drop into both eyes 2 (two) times daily.   calcium citrate-vitamin D (CITRACAL+D) 315-200 MG-UNIT tablet Take 1 tablet by mouth daily.   cholecalciferol (VITAMIN D3) 25 MCG (  1000 UNIT) tablet Take 1,000 Units by mouth every other day.   clotrimazole-betamethasone (LOTRISONE) cream Apply 1 Application topically 2 (two) times daily.   diltiazem (CARDIZEM CD) 240 MG 24 hr capsule Take 1 capsule (240 mg total) by mouth daily.   dofetilide (TIKOSYN) 500 MCG capsule TAKE ONE CAPSULE BY MOUTH TWICE DAILY   furosemide (LASIX) 40 MG tablet Take 1 tablet (40 mg total) by mouth daily. MUST SCH FOLLOW UP APPT FOR FURTHER REFILLS 1ST ATTEMPT   GINKGO BILOBA COMPLEX PO Take 1 tablet by mouth every other day.   HYDROcodone-acetaminophen (NORCO/VICODIN) 5-325 MG tablet Take 1 tablet by mouth every 6 (six) hours as needed for moderate pain.   irbesartan (AVAPRO) 300 MG tablet Take 300 mg by mouth daily.   LORazepam (ATIVAN) 1 MG tablet Take 1 mg by mouth daily as needed for anxiety.   Multiple Vitamin (MULTIVITAMIN) capsule Take 1 capsule by mouth daily.   Multiple Vitamins-Minerals (HAIR SKIN NAILS PO) Take 1 tablet by mouth every other day.   OXYGEN 2lpm 24/7 and 4 lpm with exertion AHC   Oxymetazoline HCl (SINEX ULTRA FINE MIST 12-HOUR NA) Place 1 spray into the nose daily as needed (congestion).   potassium chloride SA (KLOR-CON M) 20 MEQ tablet TAKE ONE TABLET BY MOUTH ONCE DAILY (Patient taking differently: Take 10 mEq by mouth daily.)   ROCKLATAN 0.02-0.005 % SOLN Place 1 drop into both eyes at bedtime.   UNABLE TO FIND BIPAP with 2lpm o2 at night  AHC   vitamin B-12 (CYANOCOBALAMIN) 1000 MCG tablet Take 1,000 mcg by mouth every other day.                Objective:   Physical Exam  Wts  10/03/2022           196  04/02/2022           208  10/02/2021           201   03/26/2021           208 09/25/2020         206 01/05/2020         202  01/06/2019         193  05/21/2018         190  11/18/2017         198  06/06/2017       200  .03/06/2017        204  12/05/2016         203   07/23/2016     208   06/25/16 204 lb 1.6 oz (92.6 kg)  02/06/16 204 lb 9.4 oz (92.8 kg)  02/25/08 163 lb (73.9 kg)    Vital signs reviewed  10/03/2022  - Note at rest 02 sats  95% on 2 lpm POC    General appearance:    obese pleasant wf nad        HEENT : Oropharynx  clear         NECK :  without  apparent JVD/ palpable Nodes/TM    LUNGS: no acc muscle use,  Nl contour chest which is clear to A and P bilaterally without cough on insp or exp maneuvers   CV:  RRR  no s3 or murmur or increase in P2, and trace pitting both ankles   ABD:  soft and nontender    MS:  Nl gait/ ext warm without deformities Or obvious joint restrictions  calf tenderness, cyanosis or  clubbing    SKIN: warm and dry without lesions    NEURO:  alert, approp, nl sensorium with  no motor or cerebellar deficits apparent.        I personally reviewed images and agree with radiology impression as follows:   Chest CTa   07/16/22   Unchanged mild pulmonary fibrosis in a pattern with apical to basal gradient, featuring irregular peripheral interstitial opacity, septal thickening, and traction bronchiectasis without clear evidence of subpleural bronchiolectasis or honeycombing. Mild centrilobular and paraseptal emphysema. No pleural effusion or pneumothorax.       Assessment:

## 2022-10-03 ENCOUNTER — Encounter (HOSPITAL_COMMUNITY): Payer: Self-pay | Admitting: *Deleted

## 2022-10-03 ENCOUNTER — Ambulatory Visit: Payer: Medicare Other | Admitting: Internal Medicine

## 2022-10-03 ENCOUNTER — Encounter: Payer: Self-pay | Admitting: Internal Medicine

## 2022-10-03 VITALS — BP 144/80 | HR 73 | Temp 97.6°F | Ht 60.0 in | Wt 196.0 lb

## 2022-10-03 DIAGNOSIS — J9612 Chronic respiratory failure with hypercapnia: Secondary | ICD-10-CM

## 2022-10-03 DIAGNOSIS — J9611 Chronic respiratory failure with hypoxia: Secondary | ICD-10-CM | POA: Diagnosis not present

## 2022-10-03 DIAGNOSIS — J8410 Pulmonary fibrosis, unspecified: Secondary | ICD-10-CM | POA: Diagnosis not present

## 2022-10-03 NOTE — Patient Instructions (Addendum)
I still recommend you keep up with flu/RSV/ and covid vaccinations thru your primary   Make sure you check your oxygen saturation  AT  your highest level of activity (not after you stop)   to be sure it stays over 90% and adjust  02 flow upward to maintain this level if needed but remember to turn it back to previous settings when you stop (to conserve your supply).   Please schedule a follow up visit in 9 months but call sooner if needed

## 2022-10-03 NOTE — Assessment & Plan Note (Signed)
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 However, there does appear to be a mild craniocaudal gradient. The possibility of early usual interstitial pneumonia (UIP) although not favored, is not entirely excluded. Accordingly, repeat high-resolution chest CT is recommended in 12 months  - PFT's  03/06/2017  F VC  1.77 (63%)  no % improvement from saba p 0  prior to study with DLCO  67/66 % corrects to 99  % for alv volume   - 16mw  06/06/2017   304 m on 4lpm s stopping or desats - HRCT chest 11/13/17 1. 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

## 2022-10-03 NOTE — Assessment & Plan Note (Signed)
HCO3  10/29/2016  = 32   Qualified for 02  10/29/16  Saturations on Room Air at Rest =87% - Patient Saturations on 2lpm o2= 93% - Sats walking on 4lpm 90% Started bipap per Dr Samara Snide 11/29/16  - 12/05/2016  Patient Saturations on Room Air at Rest =88% Patient Saturations on 2lpm o2= 93% - sats ok 06/06/2017 on 6 mw on 4lpm  - HCO3 10/01/17 = 25 so hypercarbia clearly better  - 05/21/2018   [facility] RA  2 laps @ 185 ft each stopped due to desats to 86% corrected on 2lpm / slow pace  - 01/05/2020 RA 92% and on 3lpm POC sats 90% p 750 ft slow pace / stopping last lap due to sob  - 03/26/2021   Walked on 2lpm  x one lap =  approx 250 ft -@ avg pace stopped due to sob with sats 90%  - HC03  08/31/21   = 29  - HC03  02/22/22 = 31 04/02/2022   Walked on 3lpm cont  x  3  lap(s) =  approx 750  ft  @ slow pace, stopped due to end of study with lowest 02 sats 89% with doe toward the end    Adequate control on present rx, reviewed in detail with pt > no change in rx needed    Reminded:  Make sure you check your oxygen saturation  AT  your highest level of activity (not after you stop)   to be sure it stays over 90% and adjust  02 flow upward to maintain this level if needed but remember to turn it back to previous settings when you stop (to conserve your supply).          Each maintenance medication was reviewed in detail including emphasizing most importantly the difference between maintenance and prns and under what circumstances the prns are to be triggered using an action plan format where appropriate.  Total time for H and P, chart review, counseling, reviewing 02 device(s) and generating customized AVS unique to this office visit / same day charting = 25 min

## 2022-10-04 DIAGNOSIS — Z780 Asymptomatic menopausal state: Secondary | ICD-10-CM | POA: Diagnosis not present

## 2022-10-04 DIAGNOSIS — M8589 Other specified disorders of bone density and structure, multiple sites: Secondary | ICD-10-CM | POA: Diagnosis not present

## 2022-10-04 DIAGNOSIS — Z1231 Encounter for screening mammogram for malignant neoplasm of breast: Secondary | ICD-10-CM | POA: Diagnosis not present

## 2022-10-04 DIAGNOSIS — M810 Age-related osteoporosis without current pathological fracture: Secondary | ICD-10-CM | POA: Diagnosis not present

## 2022-10-07 ENCOUNTER — Ambulatory Visit: Payer: Medicare Other | Admitting: Podiatry

## 2022-10-08 ENCOUNTER — Ambulatory Visit: Payer: Medicare Other | Attending: Physician Assistant

## 2022-10-08 DIAGNOSIS — I5032 Chronic diastolic (congestive) heart failure: Secondary | ICD-10-CM

## 2022-10-08 DIAGNOSIS — I2510 Atherosclerotic heart disease of native coronary artery without angina pectoris: Secondary | ICD-10-CM | POA: Diagnosis not present

## 2022-10-08 DIAGNOSIS — E785 Hyperlipidemia, unspecified: Secondary | ICD-10-CM | POA: Diagnosis not present

## 2022-10-08 DIAGNOSIS — I2584 Coronary atherosclerosis due to calcified coronary lesion: Secondary | ICD-10-CM | POA: Diagnosis not present

## 2022-10-08 DIAGNOSIS — I10 Essential (primary) hypertension: Secondary | ICD-10-CM | POA: Diagnosis not present

## 2022-10-08 DIAGNOSIS — E7800 Pure hypercholesterolemia, unspecified: Secondary | ICD-10-CM | POA: Diagnosis not present

## 2022-10-08 DIAGNOSIS — E1169 Type 2 diabetes mellitus with other specified complication: Secondary | ICD-10-CM | POA: Diagnosis not present

## 2022-10-08 LAB — LIPID PANEL
Chol/HDL Ratio: 2.2 ratio (ref 0.0–4.4)
Cholesterol, Total: 138 mg/dL (ref 100–199)
HDL: 62 mg/dL (ref >39)
LDL Chol Calc (NIH): 61 mg/dL (ref 0–99)
Triglycerides: 77 mg/dL (ref 0–149)
VLDL Cholesterol Cal: 15 mg/dL (ref 5–40)

## 2022-10-08 LAB — HEPATIC FUNCTION PANEL
ALT: 12 IU/L (ref 0–32)
AST: 18 IU/L (ref 0–40)
Albumin: 4.5 g/dL (ref 3.8–4.8)
Alkaline Phosphatase: 112 IU/L (ref 44–121)
Bilirubin Total: 0.6 mg/dL (ref 0.0–1.2)
Bilirubin, Direct: 0.17 mg/dL (ref 0.00–0.40)
Total Protein: 7.3 g/dL (ref 6.0–8.5)

## 2022-10-15 ENCOUNTER — Other Ambulatory Visit: Payer: Self-pay

## 2022-10-15 DIAGNOSIS — K862 Cyst of pancreas: Secondary | ICD-10-CM

## 2022-10-28 ENCOUNTER — Other Ambulatory Visit: Payer: Self-pay | Admitting: Gastroenterology

## 2022-10-28 ENCOUNTER — Ambulatory Visit (HOSPITAL_COMMUNITY): Admission: RE | Admit: 2022-10-28 | Payer: Medicare Other | Source: Ambulatory Visit

## 2022-10-28 DIAGNOSIS — K862 Cyst of pancreas: Secondary | ICD-10-CM

## 2022-10-30 ENCOUNTER — Ambulatory Visit (HOSPITAL_COMMUNITY)
Admission: RE | Admit: 2022-10-30 | Discharge: 2022-10-30 | Disposition: A | Discharge status: Home / Self Care | Payer: Medicare Other | Source: Ambulatory Visit | Attending: Gastroenterology | Admitting: Gastroenterology

## 2022-10-30 DIAGNOSIS — G4733 Obstructive sleep apnea (adult) (pediatric): Secondary | ICD-10-CM | POA: Diagnosis not present

## 2022-10-30 DIAGNOSIS — K862 Cyst of pancreas: Secondary | ICD-10-CM | POA: Insufficient documentation

## 2022-10-30 DIAGNOSIS — J9601 Acute respiratory failure with hypoxia: Secondary | ICD-10-CM | POA: Diagnosis not present

## 2022-10-30 DIAGNOSIS — N281 Cyst of kidney, acquired: Secondary | ICD-10-CM | POA: Diagnosis not present

## 2022-10-30 DIAGNOSIS — R069 Unspecified abnormalities of breathing: Secondary | ICD-10-CM | POA: Diagnosis not present

## 2022-10-30 DIAGNOSIS — K8689 Other specified diseases of pancreas: Secondary | ICD-10-CM | POA: Diagnosis not present

## 2022-10-30 DIAGNOSIS — J449 Chronic obstructive pulmonary disease, unspecified: Secondary | ICD-10-CM | POA: Diagnosis not present

## 2022-10-30 DIAGNOSIS — R935 Abnormal findings on diagnostic imaging of other abdominal regions, including retroperitoneum: Secondary | ICD-10-CM | POA: Diagnosis not present

## 2022-10-30 MED ORDER — GADOBUTROL 1 MMOL/ML IV SOLN
9.0000 mL | Freq: Once | INTRAVENOUS | Status: AC | PRN
  Administered 2022-10-30: 9 mL via INTRAVENOUS

## 2022-11-03 NOTE — Progress Notes (Signed)
[name],  Your MRI did not show any new or concerning features.  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  Will plan for repeat MRI in 6 months. Will repeat a CA19-9 as well.

## 2022-11-05 ENCOUNTER — Ambulatory Visit: Payer: Medicare Other | Admitting: Podiatry

## 2022-11-05 DIAGNOSIS — B351 Tinea unguium: Secondary | ICD-10-CM | POA: Diagnosis not present

## 2022-11-05 DIAGNOSIS — E1169 Type 2 diabetes mellitus with other specified complication: Secondary | ICD-10-CM | POA: Diagnosis not present

## 2022-11-05 DIAGNOSIS — M79674 Pain in right toe(s): Secondary | ICD-10-CM

## 2022-11-05 DIAGNOSIS — M79675 Pain in left toe(s): Secondary | ICD-10-CM | POA: Diagnosis not present

## 2022-11-05 DIAGNOSIS — Z7901 Long term (current) use of anticoagulants: Secondary | ICD-10-CM

## 2022-11-05 NOTE — Progress Notes (Signed)
Subjective:  72 year old female presents the above complaints.  States her nails are getting long causing discomfort.  They get ingrown if they do not get trimmed.  No swelling redness or any drainage.  Objective: AAO x3, NAD DP/PT pulses palpable bilaterally, CRT less than 3 seconds Nails are hypertrophic, dystrophic, brittle, discolored, elongated 10.  Mild incurvation of the nails.  No surrounding redness or drainage. Tenderness nails 1-5 bilaterally.  Incurvation of the hallux and multiple lesser digit nails. No open lesions or pre-ulcerative lesions are identified today. No pain with calf compression, swelling, warmth, erythema  Assessment: Symptomatic onychomycosis  Plan: -All treatment options discussed with the patient including all alternatives, risks, complications.  -Sharply debrided the nails x10 without any complications or bleeding. Monitor for any signs of infection. She does not want to have the nails removed.  -Patient encouraged to call the office with any questions, concerns, change in symptoms.   Trula Slade DPM

## 2022-11-06 DIAGNOSIS — I2510 Atherosclerotic heart disease of native coronary artery without angina pectoris: Secondary | ICD-10-CM | POA: Diagnosis not present

## 2022-11-06 DIAGNOSIS — E1169 Type 2 diabetes mellitus with other specified complication: Secondary | ICD-10-CM | POA: Diagnosis not present

## 2022-11-06 DIAGNOSIS — I5032 Chronic diastolic (congestive) heart failure: Secondary | ICD-10-CM | POA: Diagnosis not present

## 2022-11-06 DIAGNOSIS — I10 Essential (primary) hypertension: Secondary | ICD-10-CM | POA: Diagnosis not present

## 2022-11-06 DIAGNOSIS — E7800 Pure hypercholesterolemia, unspecified: Secondary | ICD-10-CM | POA: Diagnosis not present

## 2022-11-07 DIAGNOSIS — H1131 Conjunctival hemorrhage, right eye: Secondary | ICD-10-CM | POA: Diagnosis not present

## 2022-11-13 DIAGNOSIS — H401131 Primary open-angle glaucoma, bilateral, mild stage: Secondary | ICD-10-CM | POA: Diagnosis not present

## 2022-11-28 ENCOUNTER — Emergency Department (HOSPITAL_COMMUNITY): Payer: Medicare Other

## 2022-11-28 ENCOUNTER — Encounter (HOSPITAL_COMMUNITY): Payer: Self-pay | Admitting: Family Medicine

## 2022-11-28 ENCOUNTER — Inpatient Hospital Stay (HOSPITAL_COMMUNITY)
Admission: EM | Admit: 2022-11-28 | Discharge: 2022-12-12 | DRG: 871 | Disposition: A | Discharge status: Home Health | Payer: Medicare Other | Attending: Internal Medicine | Admitting: Internal Medicine

## 2022-11-28 DIAGNOSIS — I5032 Chronic diastolic (congestive) heart failure: Secondary | ICD-10-CM | POA: Diagnosis not present

## 2022-11-28 DIAGNOSIS — R069 Unspecified abnormalities of breathing: Secondary | ICD-10-CM | POA: Diagnosis not present

## 2022-11-28 DIAGNOSIS — R2681 Unsteadiness on feet: Secondary | ICD-10-CM | POA: Diagnosis not present

## 2022-11-28 DIAGNOSIS — Z9981 Dependence on supplemental oxygen: Secondary | ICD-10-CM

## 2022-11-28 DIAGNOSIS — R042 Hemoptysis: Secondary | ICD-10-CM | POA: Diagnosis present

## 2022-11-28 DIAGNOSIS — Z87891 Personal history of nicotine dependence: Secondary | ICD-10-CM

## 2022-11-28 DIAGNOSIS — J8410 Pulmonary fibrosis, unspecified: Secondary | ICD-10-CM | POA: Diagnosis present

## 2022-11-28 DIAGNOSIS — Z1152 Encounter for screening for COVID-19: Secondary | ICD-10-CM

## 2022-11-28 DIAGNOSIS — Z9842 Cataract extraction status, left eye: Secondary | ICD-10-CM

## 2022-11-28 DIAGNOSIS — I5033 Acute on chronic diastolic (congestive) heart failure: Secondary | ICD-10-CM | POA: Diagnosis not present

## 2022-11-28 DIAGNOSIS — Z79899 Other long term (current) drug therapy: Secondary | ICD-10-CM

## 2022-11-28 DIAGNOSIS — E119 Type 2 diabetes mellitus without complications: Secondary | ICD-10-CM | POA: Diagnosis not present

## 2022-11-28 DIAGNOSIS — I77810 Thoracic aortic ectasia: Secondary | ICD-10-CM | POA: Diagnosis not present

## 2022-11-28 DIAGNOSIS — F419 Anxiety disorder, unspecified: Secondary | ICD-10-CM | POA: Diagnosis present

## 2022-11-28 DIAGNOSIS — J939 Pneumothorax, unspecified: Secondary | ICD-10-CM | POA: Diagnosis not present

## 2022-11-28 DIAGNOSIS — Z7901 Long term (current) use of anticoagulants: Secondary | ICD-10-CM

## 2022-11-28 DIAGNOSIS — I10 Essential (primary) hypertension: Secondary | ICD-10-CM | POA: Diagnosis not present

## 2022-11-28 DIAGNOSIS — G894 Chronic pain syndrome: Secondary | ICD-10-CM | POA: Diagnosis not present

## 2022-11-28 DIAGNOSIS — I2510 Atherosclerotic heart disease of native coronary artery without angina pectoris: Secondary | ICD-10-CM | POA: Diagnosis not present

## 2022-11-28 DIAGNOSIS — E1169 Type 2 diabetes mellitus with other specified complication: Secondary | ICD-10-CM | POA: Diagnosis not present

## 2022-11-28 DIAGNOSIS — M7989 Other specified soft tissue disorders: Secondary | ICD-10-CM | POA: Diagnosis present

## 2022-11-28 DIAGNOSIS — I4819 Other persistent atrial fibrillation: Secondary | ICD-10-CM | POA: Diagnosis present

## 2022-11-28 DIAGNOSIS — J849 Interstitial pulmonary disease, unspecified: Secondary | ICD-10-CM | POA: Diagnosis not present

## 2022-11-28 DIAGNOSIS — Z794 Long term (current) use of insulin: Secondary | ICD-10-CM | POA: Diagnosis not present

## 2022-11-28 DIAGNOSIS — G4733 Obstructive sleep apnea (adult) (pediatric): Secondary | ICD-10-CM | POA: Diagnosis not present

## 2022-11-28 DIAGNOSIS — J9621 Acute and chronic respiratory failure with hypoxia: Secondary | ICD-10-CM | POA: Diagnosis not present

## 2022-11-28 DIAGNOSIS — G4700 Insomnia, unspecified: Secondary | ICD-10-CM | POA: Diagnosis present

## 2022-11-28 DIAGNOSIS — J9690 Respiratory failure, unspecified, unspecified whether with hypoxia or hypercapnia: Secondary | ICD-10-CM | POA: Diagnosis not present

## 2022-11-28 DIAGNOSIS — J9601 Acute respiratory failure with hypoxia: Secondary | ICD-10-CM | POA: Diagnosis not present

## 2022-11-28 DIAGNOSIS — E7800 Pure hypercholesterolemia, unspecified: Secondary | ICD-10-CM | POA: Diagnosis present

## 2022-11-28 DIAGNOSIS — R6520 Severe sepsis without septic shock: Secondary | ICD-10-CM | POA: Diagnosis present

## 2022-11-28 DIAGNOSIS — I110 Hypertensive heart disease with heart failure: Secondary | ICD-10-CM | POA: Diagnosis not present

## 2022-11-28 DIAGNOSIS — Z881 Allergy status to other antibiotic agents status: Secondary | ICD-10-CM

## 2022-11-28 DIAGNOSIS — J8489 Other specified interstitial pulmonary diseases: Secondary | ICD-10-CM | POA: Diagnosis present

## 2022-11-28 DIAGNOSIS — B964 Proteus (mirabilis) (morganii) as the cause of diseases classified elsewhere: Secondary | ICD-10-CM | POA: Diagnosis present

## 2022-11-28 DIAGNOSIS — I517 Cardiomegaly: Secondary | ICD-10-CM | POA: Diagnosis not present

## 2022-11-28 DIAGNOSIS — J439 Emphysema, unspecified: Secondary | ICD-10-CM | POA: Diagnosis present

## 2022-11-28 DIAGNOSIS — Z6838 Body mass index (BMI) 38.0-38.9, adult: Secondary | ICD-10-CM

## 2022-11-28 DIAGNOSIS — J440 Chronic obstructive pulmonary disease with acute lower respiratory infection: Secondary | ICD-10-CM | POA: Diagnosis not present

## 2022-11-28 DIAGNOSIS — R0602 Shortness of breath: Secondary | ICD-10-CM | POA: Diagnosis not present

## 2022-11-28 DIAGNOSIS — J189 Pneumonia, unspecified organism: Secondary | ICD-10-CM | POA: Diagnosis present

## 2022-11-28 DIAGNOSIS — R0902 Hypoxemia: Secondary | ICD-10-CM | POA: Diagnosis not present

## 2022-11-28 DIAGNOSIS — R0609 Other forms of dyspnea: Secondary | ICD-10-CM | POA: Diagnosis not present

## 2022-11-28 DIAGNOSIS — H409 Unspecified glaucoma: Secondary | ICD-10-CM | POA: Diagnosis present

## 2022-11-28 DIAGNOSIS — A419 Sepsis, unspecified organism: Principal | ICD-10-CM | POA: Diagnosis present

## 2022-11-28 DIAGNOSIS — R000 Tachycardia, unspecified: Secondary | ICD-10-CM | POA: Diagnosis not present

## 2022-11-28 DIAGNOSIS — Z885 Allergy status to narcotic agent status: Secondary | ICD-10-CM

## 2022-11-28 DIAGNOSIS — R8271 Bacteriuria: Secondary | ICD-10-CM | POA: Diagnosis not present

## 2022-11-28 DIAGNOSIS — J449 Chronic obstructive pulmonary disease, unspecified: Secondary | ICD-10-CM | POA: Diagnosis not present

## 2022-11-28 DIAGNOSIS — R262 Difficulty in walking, not elsewhere classified: Secondary | ICD-10-CM | POA: Diagnosis not present

## 2022-11-28 DIAGNOSIS — Z8249 Family history of ischemic heart disease and other diseases of the circulatory system: Secondary | ICD-10-CM

## 2022-11-28 DIAGNOSIS — J441 Chronic obstructive pulmonary disease with (acute) exacerbation: Secondary | ICD-10-CM | POA: Diagnosis not present

## 2022-11-28 DIAGNOSIS — Z9841 Cataract extraction status, right eye: Secondary | ICD-10-CM

## 2022-11-28 LAB — COMPREHENSIVE METABOLIC PANEL
ALT: 15 U/L (ref 0–44)
AST: 21 U/L (ref 15–41)
Albumin: 3.5 g/dL (ref 3.5–5.0)
Alkaline Phosphatase: 100 U/L (ref 38–126)
Anion gap: 13 (ref 5–15)
BUN: 15 mg/dL (ref 8–23)
CO2: 27 mmol/L (ref 22–32)
Calcium: 10 mg/dL (ref 8.9–10.3)
Chloride: 102 mmol/L (ref 98–111)
Creatinine, Ser: 0.93 mg/dL (ref 0.44–1.00)
GFR, Estimated: >60 mL/min (ref >60)
Glucose, Bld: 128 mg/dL — ABNORMAL HIGH (ref 70–99)
Potassium: 3.7 mmol/L (ref 3.5–5.1)
Sodium: 142 mmol/L (ref 135–145)
Total Bilirubin: 0.8 mg/dL (ref 0.3–1.2)
Total Protein: 8.1 g/dL (ref 6.5–8.1)

## 2022-11-28 LAB — TROPONIN I (HIGH SENSITIVITY)
Troponin I (High Sensitivity): 20 ng/L — ABNORMAL HIGH (ref <18)
Troponin I (High Sensitivity): 25 ng/L — ABNORMAL HIGH (ref <18)

## 2022-11-28 LAB — URINALYSIS, W/ REFLEX TO CULTURE (INFECTION SUSPECTED)
Bacteria, UA: NONE SEEN
Bilirubin Urine: NEGATIVE
Glucose, UA: NEGATIVE mg/dL
Ketones, ur: NEGATIVE mg/dL
Nitrite: NEGATIVE
Protein, ur: 30 mg/dL — AB
Specific Gravity, Urine: 1.011 (ref 1.005–1.030)
pH: 7 (ref 5.0–8.0)

## 2022-11-28 LAB — CBC WITH DIFFERENTIAL/PLATELET
Abs Immature Granulocytes: 0.04 10*3/uL (ref 0.00–0.07)
Basophils Absolute: 0.1 10*3/uL (ref 0.0–0.1)
Basophils Relative: 1 %
Eosinophils Absolute: 0.2 10*3/uL (ref 0.0–0.5)
Eosinophils Relative: 2 %
HCT: 43.3 % (ref 36.0–46.0)
Hemoglobin: 14.2 g/dL (ref 12.0–15.0)
Immature Granulocytes: 0 %
Lymphocytes Relative: 8 %
Lymphs Abs: 0.9 10*3/uL (ref 0.7–4.0)
MCH: 29.6 pg (ref 26.0–34.0)
MCHC: 32.8 g/dL (ref 30.0–36.0)
MCV: 90.2 fL (ref 80.0–100.0)
Monocytes Absolute: 1.1 10*3/uL — ABNORMAL HIGH (ref 0.1–1.0)
Monocytes Relative: 10 %
Neutro Abs: 8.4 10*3/uL — ABNORMAL HIGH (ref 1.7–7.7)
Neutrophils Relative %: 79 %
Platelets: 208 10*3/uL (ref 150–400)
RBC: 4.8 MIL/uL (ref 3.87–5.11)
RDW: 13.6 % (ref 11.5–15.5)
WBC: 10.6 10*3/uL — ABNORMAL HIGH (ref 4.0–10.5)
nRBC: 0 % (ref 0.0–0.2)

## 2022-11-28 LAB — APTT: aPTT: 40 seconds — ABNORMAL HIGH (ref 24–36)

## 2022-11-28 LAB — RESP PANEL BY RT-PCR (RSV, FLU A&B, COVID)  RVPGX2
Influenza A by PCR: NEGATIVE
Influenza B by PCR: NEGATIVE
Resp Syncytial Virus by PCR: NEGATIVE
SARS Coronavirus 2 by RT PCR: NEGATIVE

## 2022-11-28 LAB — BRAIN NATRIURETIC PEPTIDE: B Natriuretic Peptide: 55.8 pg/mL (ref 0.0–100.0)

## 2022-11-28 LAB — PROTIME-INR
INR: 1.3 — ABNORMAL HIGH (ref 0.8–1.2)
Prothrombin Time: 16 seconds — ABNORMAL HIGH (ref 11.4–15.2)

## 2022-11-28 LAB — LACTIC ACID, PLASMA
Lactic Acid, Venous: 1.5 mmol/L (ref 0.5–1.9)
Lactic Acid, Venous: 1.8 mmol/L (ref 0.5–1.9)

## 2022-11-28 MED ORDER — DILTIAZEM HCL ER COATED BEADS 120 MG PO CP24
240.0000 mg | ORAL_CAPSULE | Freq: Every day | ORAL | Status: DC
  Administered 2022-11-29 – 2022-12-12 (×14): 240 mg via ORAL
  Filled 2022-11-28 (×14): qty 2

## 2022-11-28 MED ORDER — ACETAMINOPHEN 325 MG PO TABS: 650.0000 mg | ORAL_TABLET | Freq: Four times a day (QID) | ORAL | Status: DC | PRN

## 2022-11-28 MED ORDER — VANCOMYCIN HCL 750 MG/150ML IV SOLN: 750.0000 mg | INTRAVENOUS | Status: DC

## 2022-11-28 MED ORDER — ACETAMINOPHEN 650 MG RE SUPP: 650.0000 mg | Freq: Four times a day (QID) | RECTAL | Status: DC | PRN

## 2022-11-28 MED ORDER — LACTATED RINGERS IV SOLN: INTRAVENOUS | Status: DC

## 2022-11-28 MED ORDER — SODIUM CHLORIDE 0.9 % IV SOLN
2.0000 g | INTRAVENOUS | Status: AC
  Administered 2022-11-28 – 2022-12-02 (×5): 2 g via INTRAVENOUS
  Filled 2022-11-28 (×5): qty 20

## 2022-11-28 MED ORDER — IPRATROPIUM-ALBUTEROL 0.5-2.5 (3) MG/3ML IN SOLN
3.0000 mL | Freq: Once | RESPIRATORY_TRACT | Status: AC
  Administered 2022-11-28: 3 mL via RESPIRATORY_TRACT
  Filled 2022-11-28: qty 3

## 2022-11-28 MED ORDER — IRBESARTAN 300 MG PO TABS
300.0000 mg | ORAL_TABLET | Freq: Every day | ORAL | Status: DC
  Administered 2022-11-29 – 2022-12-11 (×13): 300 mg via ORAL
  Filled 2022-11-28 (×13): qty 1

## 2022-11-28 MED ORDER — SODIUM CHLORIDE 0.9% FLUSH
3.0000 mL | Freq: Two times a day (BID) | INTRAVENOUS | Status: DC
  Administered 2022-11-29 – 2022-12-12 (×25): 3 mL via INTRAVENOUS

## 2022-11-28 MED ORDER — VANCOMYCIN HCL IN DEXTROSE 1-5 GM/200ML-% IV SOLN: 1000.0000 mg | Freq: Once | INTRAVENOUS | Status: DC

## 2022-11-28 MED ORDER — HYDROCODONE-ACETAMINOPHEN 5-325 MG PO TABS
1.0000 | ORAL_TABLET | Freq: Four times a day (QID) | ORAL | Status: DC | PRN
  Administered 2022-11-28 – 2022-12-12 (×38): 1 via ORAL
  Filled 2022-11-28 (×40): qty 1

## 2022-11-28 MED ORDER — APIXABAN 5 MG PO TABS
5.0000 mg | ORAL_TABLET | Freq: Two times a day (BID) | ORAL | Status: DC
  Administered 2022-11-28 – 2022-11-29 (×2): 5 mg via ORAL
  Filled 2022-11-28 (×2): qty 1

## 2022-11-28 MED ORDER — SODIUM CHLORIDE 0.9 % IV SOLN
100.0000 mg | Freq: Two times a day (BID) | INTRAVENOUS | Status: AC
  Administered 2022-11-28 – 2022-12-03 (×10): 100 mg via INTRAVENOUS
  Filled 2022-11-28 (×10): qty 100

## 2022-11-28 MED ORDER — METRONIDAZOLE 500 MG/100ML IV SOLN
500.0000 mg | Freq: Once | INTRAVENOUS | Status: AC
  Administered 2022-11-28: 500 mg via INTRAVENOUS
  Filled 2022-11-28: qty 100

## 2022-11-28 MED ORDER — VANCOMYCIN HCL 1750 MG/350ML IV SOLN
1750.0000 mg | Freq: Once | INTRAVENOUS | Status: AC
  Administered 2022-11-28: 1750 mg via INTRAVENOUS
  Filled 2022-11-28: qty 350

## 2022-11-28 MED ORDER — IOHEXOL 350 MG/ML SOLN
80.0000 mL | Freq: Once | INTRAVENOUS | Status: AC | PRN
  Administered 2022-11-28: 80 mL via INTRAVENOUS

## 2022-11-28 MED ORDER — LACTATED RINGERS IV BOLUS (SEPSIS)
1000.0000 mL | Freq: Once | INTRAVENOUS | Status: AC
  Administered 2022-11-28: 1000 mL via INTRAVENOUS

## 2022-11-28 MED ORDER — POTASSIUM CHLORIDE CRYS ER 20 MEQ PO TBCR
40.0000 meq | EXTENDED_RELEASE_TABLET | Freq: Once | ORAL | Status: AC
  Administered 2022-11-28: 40 meq via ORAL
  Filled 2022-11-28: qty 2

## 2022-11-28 MED ORDER — LORAZEPAM 1 MG PO TABS
1.0000 mg | ORAL_TABLET | Freq: Every day | ORAL | Status: DC | PRN
  Administered 2022-11-29 – 2022-12-11 (×5): 1 mg via ORAL
  Filled 2022-11-28 (×6): qty 1

## 2022-11-28 MED ORDER — SODIUM CHLORIDE 0.9 % IV SOLN
2.0000 g | Freq: Once | INTRAVENOUS | Status: AC
  Administered 2022-11-28: 2 g via INTRAVENOUS
  Filled 2022-11-28: qty 12.5

## 2022-11-28 MED ORDER — DOFETILIDE 500 MCG PO CAPS
500.0000 ug | ORAL_CAPSULE | Freq: Two times a day (BID) | ORAL | Status: DC
  Administered 2022-11-28 – 2022-12-12 (×28): 500 ug via ORAL
  Filled 2022-11-28 (×28): qty 1

## 2022-11-28 MED ORDER — SODIUM CHLORIDE 0.9 % IV SOLN: 2.0000 g | Freq: Two times a day (BID) | INTRAVENOUS | Status: DC

## 2022-11-28 MED ORDER — METHYLPREDNISOLONE SODIUM SUCC 125 MG IJ SOLR
125.0000 mg | Freq: Once | INTRAMUSCULAR | Status: AC
  Administered 2022-11-28: 125 mg via INTRAVENOUS
  Filled 2022-11-28: qty 2

## 2022-11-28 MED ORDER — POLYETHYLENE GLYCOL 3350 17 G PO PACK: 17.0000 g | PACK | Freq: Every day | ORAL | Status: DC | PRN

## 2022-11-28 MED ORDER — AZITHROMYCIN 500 MG IV SOLR
500.0000 mg | INTRAVENOUS | Status: DC
Start: 2022-11-28 — End: 2022-11-28

## 2022-11-28 MED ORDER — ATORVASTATIN CALCIUM 40 MG PO TABS
40.0000 mg | ORAL_TABLET | Freq: Every day | ORAL | Status: DC
  Administered 2022-11-29 – 2022-12-12 (×14): 40 mg via ORAL
  Filled 2022-11-28 (×14): qty 1

## 2022-11-28 NOTE — Progress Notes (Signed)
Pharmacy Antibiotic Note  [name] is a 72 y.o. female for which pharmacy has been consulted for cefepime and vancomycin dosing for sepsis.  Patient with a history of HF, HTN, COPD, pulmonary fibrosis, AF on Eliquis. Patient presenting with cough and SOB.  SCr 0.93 WBC 10.6; LA 1.5; T 100.4; HR 100; RR 44>39 COVID neg / flu neg  Plan: Metronidazole per MD Cefepime 2g q12hr  Vancomycin 1750 mg once then 750 mg q24hr (eAUC 456) unless change in renal function Trend WBC, Fever, Renal function F/u cultures, clinical course, WBC De-escalate when able Levels at steady state  Height: 5' (152.4 cm) Weight: 88.9 kg (195 lb 15.8 oz) IBW/kg (Calculated) : 45.5  No data recorded.  Recent Labs  Lab 11/28/22 1431  WBC 10.6*    CrCl cannot be calculated (Patient's most recent lab result is older than the maximum 21 days allowed.).    Allergies  Allergen Reactions   Oxycodone Hcl Itching   Levaquin [Levofloxacin In D5w] Itching and Rash   Percocet [Oxycodone-Acetaminophen] Itching   Microbiology results: Pending  Thank you for allowing pharmacy to be a part of this patient's care.  Lorelei Pont, PharmD, BCPS 11/28/2022 3:51 PM ED Clinical Pharmacist -  [phone]

## 2022-11-28 NOTE — ED Triage Notes (Signed)
Pt c/o SOB, HA started this morning. Pt states had hemoptysis started today. Pt states at home her Sa02 was 51% on her 2L 02 she wears at baseline.

## 2022-11-28 NOTE — ED Notes (Signed)
This NT did a rectal temp on this pt the temp 100.4

## 2022-11-28 NOTE — Progress Notes (Signed)
Elink will follow per sepsis protocol  

## 2022-11-28 NOTE — H&P (Addendum)
History and Physical    [name] [id number] DOB: 29-Jul-1951 DOA: 11/28/2022  PCP: Shirline Frees, MD   Patient coming from: Home   Chief Complaint: SOB, cough, fever   HPI: [name] is a pleasant 72 y.o. female with medical history significant for atrial fibrillation on Eliquis, pulmonary fibrosis with chronic hypoxic respiratory failure, hypertension, anxiety, and chronic pain who presents to the emergency department with cough, shortness of breath, and fevers.  Patient reports several days of increased shortness of breath and increased productive cough.  She also reports subjective fevers for few days.  Today, the patient has had a few episodes of scant hemoptysis.  She denies any chest pain.  Her chronic bilateral leg swelling is unchanged recently.  ED Course: Upon arrival to the ED, patient is found to be febrile to 38 C with tachycardia, tachypnea, and requiring 6 L/min of supplemental oxygen.  Has been stable.  EKG demonstrates sinus tachycardia.  Labs are most notable for a lactic acid preserved renal function, and negative respiratory virus panel.  CTA chest is negative for central PE but notable for diffuse patchy opacities on a background of fibrosis and suspicious for atypical infection.  Blood and urine cultures were collected in the ED and the patient was treated with DuoNeb, IV fluids, IV Solu-Medrol, vancomycin, cefepime, and Flagyl.  Review of Systems:  All other systems reviewed and apart from HPI, are negative.  Past Medical History:  Diagnosis Date   Chronic diastolic CHF (congestive heart failure)    Chronic respiratory failure    Coronary atherosclerosis    a. 2V by CT 10/2017.   Emphysema of lung    Hypercholesterolemia    Hypertension    NSIP (nonspecific interstitial pneumonia)    Osteoarthritis    Persistent atrial fibrillation    Thoracic aortic ectasia     Past Surgical History:  Procedure Laterality Date   BIOPSY  12/10/2021    Procedure: BIOPSY;  Surgeon: Rush Landmark Telford Nab., MD;  Location: [facility];  Service: Gastroenterology;;   CARDIOVERSION N/A 07/25/2016   Procedure: CARDIOVERSION;  Surgeon: Fay Records, MD;  Location: [place];  Service: Cardiovascular;  Laterality: N/A;   CARDIOVERSION N/A 09/14/2016   Procedure: CARDIOVERSION;  Surgeon: Thompson Grayer, MD;  Location: [place];  Service: Cardiovascular;  Laterality: N/A;   CATARACT EXTRACTION Bilateral    ESOPHAGOGASTRODUODENOSCOPY (EGD) WITH PROPOFOL N/A 12/10/2021   Procedure: ESOPHAGOGASTRODUODENOSCOPY (EGD) WITH PROPOFOL;  Surgeon: Irving Copas., MD;  Location: [facility];  Service: Gastroenterology;  Laterality: N/A;   EUS N/A 12/10/2021   Procedure: UPPER ENDOSCOPIC ULTRASOUND (EUS) RADIAL;  Surgeon: Irving Copas., MD;  Location: [facility];  Service: Gastroenterology;  Laterality: N/A;   FINE NEEDLE ASPIRATION  12/10/2021   Procedure: FINE NEEDLE ASPIRATION;  Surgeon: Rush Landmark Telford Nab., MD;  Location: [facility];  Service: Gastroenterology;;   LEG SURGERY Right    TEE WITHOUT CARDIOVERSION N/A 07/25/2016   Procedure: TRANSESOPHAGEAL ECHOCARDIOGRAM (TEE);  Surgeon: Fay Records, MD;  Location: [facility];  Service: Cardiovascular;  Laterality: N/A;    Social History:   reports that she quit smoking about 12 years ago. Her smoking use included cigarettes. She started smoking about 56 years ago. She has a 43.00 pack-year smoking history. She has never used smokeless tobacco. She reports that she does not drink alcohol and does not use drugs.  Allergies  Allergen Reactions   Oxycodone Hcl Itching   Levaquin [Levofloxacin In D5w] Itching and Rash  Percocet [Oxycodone-Acetaminophen] Itching    Family History  Problem Relation Age of Onset   Heart disease Father    Heart attack Sister    Stroke Neg Hx      Prior to Admission medications   Medication Sig Start Date End Date Taking? Authorizing Provider   ACCU-CHEK GUIDE test strip See admin instructions. 07/31/20   [provider]  acetaminophen (TYLENOL) 500 MG tablet Take 500 mg by mouth every 6 (six) hours as needed for moderate pain.    [provider]  apixaban (ELIQUIS) 5 MG TABS tablet Take 1 tablet (5 mg total) by mouth 2 (two) times daily. 09/27/22   Sherran Needs, NP  ascorbic acid (VITAMIN C) 500 MG tablet Take 500 mg by mouth every other day.    [provider]  atorvastatin (LIPITOR) 40 MG tablet Take 1 tablet (40 mg total) by mouth daily. 07/30/22   Elgie Collard, PA-C  brimonidine (ALPHAGAN) 0.2 % ophthalmic solution Place 1 drop into both eyes 2 (two) times daily. 05/11/20   [provider]  calcium citrate-vitamin D (CITRACAL+D) 315-200 MG-UNIT tablet Take 1 tablet by mouth daily.    [provider]  cholecalciferol (VITAMIN D3) 25 MCG (1000 UNIT) tablet Take 1,000 Units by mouth every other day.    [provider]  clotrimazole-betamethasone (LOTRISONE) cream Apply 1 Application topically 2 (two) times daily. 12/17/21   [provider]  diltiazem (CARDIZEM CD) 240 MG 24 hr capsule Take 1 capsule (240 mg total) by mouth daily. 05/09/22   Jettie Booze, MD  dofetilide (TIKOSYN) 500 MCG capsule TAKE ONE CAPSULE BY MOUTH TWICE DAILY 08/05/22   Sherran Needs, NP  furosemide (LASIX) 40 MG tablet Take 1 tablet (40 mg total) by mouth daily. MUST [facility] FOLLOW UP APPT FOR FURTHER REFILLS 1ST ATTEMPT 02/22/20   Jettie Booze, MD  [facility] BILOBA COMPLEX PO Take 1 tablet by mouth every other day.    [provider]  HYDROcodone-acetaminophen (NORCO/VICODIN) 5-325 MG tablet Take 1 tablet by mouth every 6 (six) hours as needed for moderate pain.    [provider]  irbesartan (AVAPRO) 300 MG tablet Take 300 mg by mouth daily. 07/29/20   [provider]  LORazepam (ATIVAN) 1 MG tablet Take 1 mg by mouth daily as needed for anxiety. 01/05/16    [provider]  Multiple Vitamin (MULTIVITAMIN) capsule Take 1 capsule by mouth daily.    [provider]  Multiple Vitamins-Minerals (HAIR SKIN NAILS PO) Take 1 tablet by mouth every other day.    [provider]  OXYGEN 2lpm 24/7 and 4 lpm with exertion [facility]    [provider]  Oxymetazoline HCl (SINEX ULTRA FINE MIST 12-HOUR NA) Place 1 spray into the nose daily as needed (congestion).    [provider]  potassium chloride SA (KLOR-CON M) 20 MEQ tablet TAKE ONE TABLET BY MOUTH ONCE DAILY Patient taking differently: Take 10 mEq by mouth daily. 11/05/21   Sherran Needs, NP  ROCKLATAN 0.02-0.005 % SOLN Place 1 drop into both eyes at bedtime. 01/15/21   [provider]  UNABLE TO FIND BIPAP with 2lpm o2 at night  [facility]    [provider]  vitamin B-12 (CYANOCOBALAMIN) 1000 MCG tablet Take 1,000 mcg by mouth every other day.    [provider]    Physical Exam: Vitals:   11/28/22 1730 11/28/22 1800 11/28/22 1834 11/28/22 1836  BP: (!) 151/93 (!) 168/97 Marland Kitchen)  154/98   Pulse: 94 98  (!) 104  Resp: (!) 33 (!) 21 (!) 39 (!) 22  Temp:      TempSrc:      SpO2: 92% (!) 89% 92% 92%  Weight:      Height:        Constitutional: NAD, calm  Eyes: PERTLA, lids and conjunctivae normal ENMT: Mucous membranes are moist. Posterior pharynx clear of any exudate or lesions.   Neck: supple, no masses  Respiratory: Labored respirations. Coarse rales bilaterally.  Cardiovascular: S1 & S2 heard, regular rate and rhythm. Trace lower extremity edema.   Abdomen: No distension, no tenderness, soft. Bowel sounds active.  Musculoskeletal: no clubbing / cyanosis. No joint deformity upper and lower extremities.   Skin: no significant rashes, lesions, ulcers. Warm, dry, well-perfused. Neurologic: CN 2-12 grossly intact. Moving all extremities. Alert and oriented.  Psychiatric: Pleasant. Cooperative.    Labs and Imaging on Admission: I have  personally reviewed following labs and imaging studies  CBC: Recent Labs  Lab 11/28/22 1431  WBC 10.6*  NEUTROABS 8.4*  HGB 14.2  HCT 43.3  MCV 90.2  PLT 123XX123   Basic Metabolic Panel: Recent Labs  Lab 11/28/22 1431  NA 142  K 3.7  CL 102  CO2 27  GLUCOSE 128*  BUN 15  CREATININE 0.93  CALCIUM 10.0   GFR: Estimated Creatinine Clearance: 54.3 mL/min (by C-G formula based on SCr of 0.93 mg/dL). Liver Function Tests: Recent Labs  Lab 11/28/22 1431  AST 21  ALT 15  ALKPHOS 100  BILITOT 0.8  PROT 8.1  ALBUMIN 3.5   No results for input(s): "LIPASE", "AMYLASE" in the last 168 hours. No results for input(s): "AMMONIA" in the last 168 hours. Coagulation Profile: Recent Labs  Lab 11/28/22 1431  INR 1.3*   Cardiac Enzymes: No results for input(s): "CKTOTAL", "CKMB", "CKMBINDEX", "TROPONINI" in the last 168 hours. BNP (last 3 results) No results for input(s): "PROBNP" in the last 8760 hours. HbA1C: No results for input(s): "HGBA1C" in the last 72 hours. CBG: No results for input(s): "GLUCAP" in the last 168 hours. Lipid Profile: No results for input(s): "CHOL", "HDL", "LDLCALC", "TRIG", "CHOLHDL", "LDLDIRECT" in the last 72 hours. Thyroid Function Tests: No results for input(s): "TSH", "T4TOTAL", "FREET4", "T3FREE", "THYROIDAB" in the last 72 hours. Anemia Panel: No results for input(s): "VITAMINB12", "FOLATE", "FERRITIN", "TIBC", "IRON", "RETICCTPCT" in the last 72 hours. Urine analysis:    Component Value Date/Time   COLORURINE YELLOW 11/28/2022 1431   APPEARANCEUR HAZY (A) 11/28/2022 1431   LABSPEC 1.011 11/28/2022 1431   PHURINE 7.0 11/28/2022 1431   GLUCOSEU NEGATIVE 11/28/2022 1431   HGBUR SMALL (A) 11/28/2022 1431   BILIRUBINUR NEGATIVE 11/28/2022 1431   KETONESUR NEGATIVE 11/28/2022 1431   PROTEINUR 30 (A) 11/28/2022 1431   NITRITE NEGATIVE 11/28/2022 1431   LEUKOCYTESUR MODERATE (A) 11/28/2022 1431   Sepsis  Labs: @LABRCNTIP (procalcitonin:4,lacticidven:4) ) Recent Results (from the past 240 hour(s))  Resp panel by RT-PCR (RSV, Flu A&B, Covid)     Status: None   Collection Time: 11/28/22  2:32 PM   Specimen: Nasal Swab  Result Value Ref Range Status   SARS Coronavirus 2 by RT PCR NEGATIVE NEGATIVE Final   Influenza A by PCR NEGATIVE NEGATIVE Final   Influenza B by PCR NEGATIVE NEGATIVE Final    Comment: (NOTE) The Xpert Xpress SARS-CoV-2/FLU/RSV plus assay is intended as an aid in the diagnosis of influenza from Nasopharyngeal swab specimens and should not be used as  a sole basis for treatment. Nasal washings and aspirates are unacceptable for Xpert Xpress SARS-CoV-2/FLU/RSV testing.  Fact Sheet for Patients: EntrepreneurPulse.com.au  Fact Sheet for Healthcare Providers: IncredibleEmployment.be  This test is not yet approved or cleared by the [place] FDA and has been authorized for detection and/or diagnosis of SARS-CoV-2 by FDA under an Emergency Use Authorization (EUA). This EUA will remain in effect (meaning this test can be used) for the duration of the COVID-19 declaration under Section 564(b)(1) of the Act, 21 U.S.C. section 360bbb-3(b)(1), unless the authorization is terminated or revoked.     Resp Syncytial Virus by PCR NEGATIVE NEGATIVE Final    Comment: (NOTE) Fact Sheet for Patients: EntrepreneurPulse.com.au  Fact Sheet for Healthcare Providers: IncredibleEmployment.be  This test is not yet approved or cleared by the [place] FDA and has been authorized for detection and/or diagnosis of SARS-CoV-2 by FDA under an Emergency Use Authorization (EUA). This EUA will remain in effect (meaning this test can be used) for the duration of the COVID-19 declaration under Section 564(b)(1) of the Act, 21 U.S.C. section 360bbb-3(b)(1), unless the authorization is terminated or revoked.  Performed  at [facility], [place] [address] [place],  [address]      Radiological Exams on Admission: CT Angio Chest PE W and/or Wo Contrast  Result Date: 11/28/2022 CLINICAL DATA:  Shortness of breath and headache.  Hemoptysis today. EXAM: CT ANGIOGRAPHY CHEST WITH CONTRAST TECHNIQUE: Multidetector CT imaging of the chest was performed using the standard protocol during bolus administration of intravenous contrast. Multiplanar CT image reconstructions and MIPs were obtained to evaluate the vascular anatomy. RADIATION DOSE REDUCTION: This exam was performed according to the departmental dose-optimization program which includes automated exposure control, adjustment of the mA and/or kV according to patient size and/or use of iterative reconstruction technique. CONTRAST:  9mL OMNIPAQUE IOHEXOL 350 MG/ML SOLN COMPARISON:  Radiographs earlier today and CTA chest 07/16/2022 FINDINGS: Cardiovascular: Satisfactory opacification of the central pulmonary arteries. The segmental and subsegmental lower lobe pulmonary arteries are not well evaluated due to motion. No evidence of central pulmonary embolism. Normal heart size. No pericardial effusion. Coronary artery and aortic atherosclerotic calcification. 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 Mediastinum/Nodes: No enlarged mediastinal, hilar, or axillary lymph nodes. Thyroid gland, trachea, and esophagus demonstrate no significant findings. Lungs/Pleura: Low lung volumes. Diffuse patchy ground-glass opacities and interlobular septal thickening are new since 07/16/2022. This is superimposed on a background of mild pulmonary fibrosis with a apical to basal gradient and peripheral reticular opacities.  No pleural effusion or pneumothorax. Right upper lobe bronchiolectasis. Upper Abdomen: No acute abnormality. Musculoskeletal: No chest wall abnormality. No acute osseous findings. Review of the MIP images confirms the above findings. IMPRESSION: 1. No evidence of central pulmonary embolism. The segmental and subsegmental lower lobe pulmonary arteries are not well evaluated due to motion. 2. Diffuse patchy ground-glass opacities superimposed on a background of mild pulmonary fibrosis. Findings are favored to represent atypical infection. 3. 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 Recommend semi-annual imaging followup by CTA or MRA and referral to cardiothoracic surgery if not already obtained. This recommendation follows 2010 ACCF/AHA/AATS/ACR/ASA/SCA/SCAI/SIR/STS/SVM  Guidelines for the Diagnosis and Management of Patients With Thoracic Aortic Disease. Circulation. 2010; [id number]. Aortic aneurysm NOS (ICD10-I71.9) 4. 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 Aortic Atherosclerosis (ICD10-I70.0). Electronically Signed   By: Placido Sou M.D.   On: 11/28/2022 19:52   DG Chest Port 1 View  Result Date: 11/28/2022 CLINICAL DATA:  Question sepsis. EXAM: PORTABLE CHEST 1 VIEW COMPARISON:  Chest CT 07/16/2022 FINDINGS: Stable enlarged cardiac silhouette ectatic aorta. There is bilateral interstitial prominence. Low lung volumes. No pneumothorax. No focal consolidation IMPRESSION: Cardiomegaly and interstitial prominence suggest interstitial edema. Electronically Signed   By: Suzy Bouchard M.D.   On: 11/28/2022 14:59    EKG: Independently reviewed. Sinus tachycardia, rate 106.   Assessment/Plan   1. Pneumonia; acute on chronic hypoxic respiratory failure; pulmonary fibrosis  - Treat with  Rocephin and doxycycline for now, continue supplemental O2, trend procalcitonin, follow cultures and clinical course   2. OSA  - Continue BiPAP qHS    3. Atrial fibrillation  - Continue Eliquis, Tikosyn, and diltiazem    4. Hypertension  - Continue diltiazem and irbesartan    5. Chronic pain; anxiety  - Prescription database reviewed, plan to continue Norco and Ativan as at home    6. Hemoptysis  - Scant, no central PE on CTA chest  - Likely d/t infection, continue antibiotics as above, cautiously continue Eliquis   7. Thoracic aorta ectasia  - Noted on CTA in ED  - Continue CT surgery follow-up as planned    DVT prophylaxis: Eliquis  Code Status: Full  Level of Care: Level of care: Progressive Family Communication: Son at bedside   Disposition Plan:  Patient is from: home  Anticipated d/c is to: TBD Anticipated d/c date is: 12/01/22  Patient currently: Pending improved/stable respiratory status  Consults called: None  Admission status: Inpatient     Vianne Bulls, MD [facility]  11/28/2022, 8:31 PM

## 2022-11-28 NOTE — ED Provider Notes (Signed)
[facility] Provider Note   CSN: [id number] Arrival date & time: 11/28/22  1406     History  Chief Complaint  Patient presents with   Shortness of Breath    [name] is a 72 y.o. female with a past medical history significant for CHF, hypertension, COPD, pulmonary fibrosis, and persistent atrial fibrillation on chronic Eliquis who presents to the ED due to cough and shortness of breath x4 days. Patient chronically on 2L [place]. She has been increasing her O2 to 3-4L over the past few days due to SOB.  Denies associated chest pain.  Admits to intermittent wheeze.  Also endorses a fever over the past few days.  Upon EMS arrival, patient found to be hypoxic at 51% on her baseline 2 L nasal cannula.  Patient also admits to bilateral lower extremity edema around her ankles which she notes "happens frequently". No nausea, vomiting, diarrhea, or abdominal pain.   History obtained from patient and past medical records. No interpreter used during encounter.       Home Medications Prior to Admission medications   Medication Sig Start Date End Date Taking? Authorizing Provider  ACCU-CHEK GUIDE test strip See admin instructions. 07/31/20   [provider]  acetaminophen (TYLENOL) 500 MG tablet Take 500 mg by mouth every 6 (six) hours as needed for moderate pain.    [provider]  apixaban (ELIQUIS) 5 MG TABS tablet Take 1 tablet (5 mg total) by mouth 2 (two) times daily. 09/27/22   Sherran Needs, NP  ascorbic acid (VITAMIN C) 500 MG tablet Take 500 mg by mouth every other day.    [provider]  atorvastatin (LIPITOR) 40 MG tablet Take 1 tablet (40 mg total) by mouth daily. 07/30/22   Elgie Collard, PA-C  brimonidine (ALPHAGAN) 0.2 % ophthalmic solution Place 1 drop into both eyes 2 (two) times daily. 05/11/20   [provider]  calcium citrate-vitamin D (CITRACAL+D) 315-200 MG-UNIT tablet Take 1 tablet by mouth  daily.    [provider]  cholecalciferol (VITAMIN D3) 25 MCG (1000 UNIT) tablet Take 1,000 Units by mouth every other day.    [provider]  clotrimazole-betamethasone (LOTRISONE) cream Apply 1 Application topically 2 (two) times daily. 12/17/21   [provider]  diltiazem (CARDIZEM CD) 240 MG 24 hr capsule Take 1 capsule (240 mg total) by mouth daily. 05/09/22   Jettie Booze, MD  dofetilide (TIKOSYN) 500 MCG capsule TAKE ONE CAPSULE BY MOUTH TWICE DAILY 08/05/22   Sherran Needs, NP  furosemide (LASIX) 40 MG tablet Take 1 tablet (40 mg total) by mouth daily. MUST [facility] FOLLOW UP APPT FOR FURTHER REFILLS 1ST ATTEMPT 02/22/20   Jettie Booze, MD  [facility] BILOBA COMPLEX PO Take 1 tablet by mouth every other day.    [provider]  HYDROcodone-acetaminophen (NORCO/VICODIN) 5-325 MG tablet Take 1 tablet by mouth every 6 (six) hours as needed for moderate pain.    [provider]  irbesartan (AVAPRO) 300 MG tablet Take 300 mg by mouth daily. 07/29/20   [provider]  LORazepam (ATIVAN) 1 MG tablet Take 1 mg by mouth daily as needed for anxiety. 01/05/16   [provider]  Multiple Vitamin (MULTIVITAMIN) capsule Take 1 capsule by mouth daily.    [provider]  Multiple Vitamins-Minerals (HAIR SKIN NAILS PO) Take 1 tablet by mouth every other day.    [provider]  OXYGEN  2lpm 24/7 and 4 lpm with exertion [facility]    [provider]  Oxymetazoline HCl (SINEX ULTRA FINE MIST 12-HOUR NA) Place 1 spray into the nose daily as needed (congestion).    [provider]  potassium chloride SA (KLOR-CON M) 20 MEQ tablet TAKE ONE TABLET BY MOUTH ONCE DAILY Patient taking differently: Take 10 mEq by mouth daily. 11/05/21   Sherran Needs, NP  ROCKLATAN 0.02-0.005 % SOLN Place 1 drop into both eyes at bedtime. 01/15/21   [provider]  UNABLE TO FIND BIPAP with 2lpm o2 at night  [facility]     [provider]  vitamin B-12 (CYANOCOBALAMIN) 1000 MCG tablet Take 1,000 mcg by mouth every other day.    [provider]      Allergies    Oxycodone hcl, Levaquin [levofloxacin in d5w], and Percocet [oxycodone-acetaminophen]    Review of Systems   Review of Systems  Constitutional:  Positive for fever.  Respiratory:  Positive for cough and shortness of breath.   Cardiovascular:  Positive for leg swelling. Negative for chest pain.  Gastrointestinal:  Negative for abdominal pain, diarrhea, nausea and vomiting.    Physical Exam Updated Vital Signs BP (!) 154/98   Pulse (!) 104   Temp (!) 100.4 F (38 C) (Rectal)   Resp (!) 22   Ht 5' (1.524 m)   Wt 88.9 kg   SpO2 92%   BMI 38.28 kg/m  Physical Exam Vitals and nursing note reviewed.  Constitutional:      General: She is not in acute distress.    Appearance: She is not ill-appearing.  HENT:     Head: Normocephalic.  Eyes:     Pupils: Pupils are equal, round, and reactive to light.  Cardiovascular:     Rate and Rhythm: Normal rate and regular rhythm.     Pulses: Normal pulses.     Heart sounds: Normal heart sounds. No murmur heard.    No friction rub. No gallop.  Pulmonary:     Effort: Pulmonary effort is normal.     Breath sounds: Normal breath sounds.     Comments: Speaking in broken up sentences. On 6L [place] O2 at 90% Abdominal:     General: Abdomen is flat. There is no distension.     Palpations: Abdomen is soft.     Tenderness: There is no abdominal tenderness. There is no guarding or rebound.  Musculoskeletal:        General: Normal range of motion.     Cervical back: Neck supple.     Comments: Pitting edema around bilateral ankles.  Skin:    General: Skin is warm and dry.  Neurological:     General: No focal deficit present.     Mental Status: She is alert.  Psychiatric:        Mood and Affect: Mood normal.        Behavior: Behavior normal.     ED Results / Procedures / Treatments    Labs (all labs ordered are listed, but only abnormal results are displayed) Labs Reviewed  COMPREHENSIVE METABOLIC PANEL - Abnormal; Notable for the following components:      Result Value   Glucose, Bld 128 (*)    All other components within normal limits  CBC WITH DIFFERENTIAL/PLATELET - Abnormal; Notable for the following components:   WBC 10.6 (*)    Neutro Abs 8.4 (*)    Monocytes Absolute 1.1 (*)    All other components within normal  limits  PROTIME-INR - Abnormal; Notable for the following components:   Prothrombin Time 16.0 (*)    INR 1.3 (*)    All other components within normal limits  APTT - Abnormal; Notable for the following components:   aPTT 40 (*)    All other components within normal limits  URINALYSIS, W/ REFLEX TO CULTURE (INFECTION SUSPECTED) - Abnormal; Notable for the following components:   APPearance HAZY (*)    Hgb urine dipstick SMALL (*)    Protein, ur 30 (*)    Leukocytes,Ua MODERATE (*)    All other components within normal limits  TROPONIN I (HIGH SENSITIVITY) - Abnormal; Notable for the following components:   Troponin I (High Sensitivity) 20 (*)    All other components within normal limits  RESP PANEL BY RT-PCR (RSV, FLU A&B, COVID)  RVPGX2  CULTURE, BLOOD (ROUTINE X 2)  CULTURE, BLOOD (ROUTINE X 2)  URINE CULTURE  MRSA NEXT GEN BY PCR, NASAL  LACTIC ACID, PLASMA  LACTIC ACID, PLASMA  BRAIN NATRIURETIC PEPTIDE  TROPONIN I (HIGH SENSITIVITY)    EKG EKG Interpretation  Date/Time:  Thursday November 28 2022 14:23:48 EDT Ventricular Rate:  106 PR Interval:  146 QRS Duration: 72 QT Interval:  324 QTC Calculation: 430 R Axis:   -11 Text Interpretation: Sinus tachycardia Low voltage QRS Cannot rule out Anterior infarct , age undetermined Abnormal ECG When compared with ECG of 21-Aug-2022 09:15, PREVIOUS ECG IS PRESENT Confirmed by Nanda Quinton [phone]) on 11/28/2022 2:30:37 PM  Radiology CT Angio Chest PE W and/or Wo Contrast  Result Date:  11/28/2022 CLINICAL DATA:  Shortness of breath and headache.  Hemoptysis today. EXAM: CT ANGIOGRAPHY CHEST WITH CONTRAST TECHNIQUE: Multidetector CT imaging of the chest was performed using the standard protocol during bolus administration of intravenous contrast. Multiplanar CT image reconstructions and MIPs were obtained to evaluate the vascular anatomy. RADIATION DOSE REDUCTION: This exam was performed according to the departmental dose-optimization program which includes automated exposure control, adjustment of the mA and/or kV according to patient size and/or use of iterative reconstruction technique. CONTRAST:  33mL OMNIPAQUE IOHEXOL 350 MG/ML SOLN COMPARISON:  Radiographs earlier today and CTA chest 07/16/2022 FINDINGS: Cardiovascular: Satisfactory opacification of the central pulmonary arteries. The segmental and subsegmental lower lobe pulmonary arteries are not well evaluated due to motion. No evidence of central pulmonary embolism. Normal heart size. No pericardial effusion. Coronary artery and aortic atherosclerotic calcification. 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 Mediastinum/Nodes: No enlarged mediastinal, hilar, or axillary lymph nodes. Thyroid gland, trachea, and esophagus demonstrate no significant findings. Lungs/Pleura: Low lung volumes. Diffuse patchy ground-glass opacities and interlobular septal thickening are new since 07/16/2022. This is superimposed on a background of mild pulmonary fibrosis with a apical to basal gradient and peripheral reticular opacities. No pleural effusion or pneumothorax. Right upper lobe bronchiolectasis. Upper Abdomen: No acute abnormality. Musculoskeletal: No chest wall abnormality. No acute  osseous findings. Review of the MIP images confirms the above findings. IMPRESSION: 1. No evidence of central pulmonary embolism. The segmental and subsegmental lower lobe pulmonary arteries are not well evaluated due to motion. 2. Diffuse patchy ground-glass opacities superimposed on a background of mild pulmonary fibrosis. Findings are favored to represent atypical infection. 3. 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 Recommend semi-annual imaging followup by CTA or MRA and referral to cardiothoracic surgery if not already obtained. This recommendation follows 2010 ACCF/AHA/AATS/ACR/ASA/SCA/SCAI/SIR/STS/SVM Guidelines for the Diagnosis and Management of Patients With Thoracic Aortic Disease. Circulation. 2010; [id number]. Aortic aneurysm NOS (ICD10-I71.9) 4. 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 Aortic Atherosclerosis (ICD10-I70.0). Electronically Signed   By: Placido Sou M.D.   On: 11/28/2022 19:52   DG Chest Port 1 View  Result Date: 11/28/2022 CLINICAL DATA:  Question sepsis. EXAM: PORTABLE CHEST 1 VIEW COMPARISON:  Chest CT 07/16/2022 FINDINGS: Stable enlarged cardiac silhouette ectatic aorta. There is bilateral interstitial prominence. Low lung volumes. No pneumothorax. No focal consolidation IMPRESSION: Cardiomegaly and interstitial prominence suggest interstitial edema. Electronically Signed   By: Suzy Bouchard M.D.   On: 11/28/2022 14:59    Procedures .Critical Care  Performed by: Suzy Bouchard, PA-C Authorized by: Suzy Bouchard, PA-C   Critical care provider statement:    Critical care time (minutes):  37   Critical care was necessary to treat or prevent imminent or life-threatening deterioration of the following conditions:  Respiratory failure   Critical  care was time spent personally by me on the following activities:  Development of treatment plan with patient or surrogate, discussions with consultants, evaluation of patient's response to treatment, examination of patient, ordering and review of laboratory studies, ordering and review of radiographic studies, ordering and performing treatments and interventions, pulse oximetry, re-evaluation of patient's condition and review of old charts   I assumed direction of critical care for this patient from another provider in my specialty: no     Care discussed with: admitting provider       Medications Ordered in ED Medications  lactated ringers infusion ( Intravenous New Bag/Given 11/28/22 1630)  vancomycin (VANCOREADY) IVPB 1750 mg/350 mL (1,750 mg Intravenous New Bag/Given 11/28/22 1834)  vancomycin (VANCOREADY) IVPB 750 mg/150 mL (has no administration in time range)  ceFEPIme (MAXIPIME) 2 g in sodium chloride 0.9 % 100 mL IVPB (has no administration in time range)  methylPREDNISolone sodium succinate (SOLU-MEDROL) 125 mg/2 mL injection 125 mg (125 mg Intravenous Given 11/28/22 1634)  ipratropium-albuterol (DUONEB) 0.5-2.5 (3) MG/3ML nebulizer solution 3 mL (3 mLs Nebulization Given 11/28/22 1637)  lactated ringers bolus 1,000 mL (0 mLs Intravenous Stopped 11/28/22 1806)  ceFEPIme (MAXIPIME) 2 g in sodium chloride 0.9 % 100 mL IVPB (0 g Intravenous Stopped 11/28/22 1806)  metroNIDAZOLE (FLAGYL) IVPB 500 mg (0 mg Intravenous Stopped 11/28/22 1723)  iohexol (OMNIPAQUE) 350 MG/ML injection 80 mL (80 mLs Intravenous Contrast Given 11/28/22 1932)    ED Course/ Medical Decision Making/ A&P Clinical Course as of 11/28/22 2021  Thu Nov 28, 2022  [place] Reassessed patient at bedside.  Patient resting comfortably in bed.  O2 saturation at 93%.  RN drawing labs.  Awaiting rectal temperature [CA]  1547 Rectal temperature 100.4. Code sepsis activated. IVFs and antibiotics started. CXR negative for signs of PNA, broad  spectrum antibiotics started. [CA]  1548 WBC(!): 10.6 [CA]  1826 Reassessed patient. Patient admits to improvement in SOB after breathing treatment. Awaiting CTA chest. [CA]  2002 Reassessed patient, Patient still on 6L. CTA shows atypical PNA. Will consult hospitalist for admission. [CA]    Clinical Course User Index [CA] Suzy Bouchard, PA-C  Medical Decision Making Amount and/or Complexity of Data Reviewed Independent Historian: EMS External Data Reviewed: notes. Labs: ordered. Decision-making details documented in ED Course. Radiology: ordered and independent interpretation performed. Decision-making details documented in ED Course. ECG/medicine tests: ordered and independent interpretation performed. Decision-making details documented in ED Course.  Risk Prescription drug management. Decision regarding hospitalization.   This patient presents to the ED for concern of SOB, cough, fever, this involves an extensive number of treatment options, and is a complaint that carries with it a high risk of complications and morbidity.  The differential diagnosis includes PNA, COPD exacerbation, PE, etc  72 year old female presents to the ED due to shortness of breath, cough, and fever for the past 4 days.  History of COPD, pulmonary fibrosis, persistent A-fib on chronic Eliquis.  Patient on 2 L nasal cannula at baseline.  Patient admits to some episodes of hemoptysis earlier today.  Upon EMS arrival patient found to be hypoxic at 51% on her baseline 2 L nasal cannula.  During initial valuation patient on 6 L nasal cannula with O2 saturation in the lower 90s and upper 80s.  Patient speaking in broken up sentences.  No wheeze, stridor, or rales on exam.  Some lower extremity edema around bilateral ankles. Sepsis labs ordered. BNP to rule out CHF. Duoneb treatment and steroids given. RVP to rule out infection.   CBC with mild leukocytosis at 10.6.  Normal hemoglobin.   BNP normal.  Low suspicion for CHF.  CMP significant for hyperglycemia 128.  No anion gap.  Normal renal function.  No major electrolyte derangements.  UA significant for moderate leukocytes.  No bacteria.  Proteinuria.  Troponin elevated at 20.  Will obtain delta troponin to rule out ACS.  EKG sinus tachycardia. Lactic acid normal.  RVP negative.  Chest x-ray personally reviewed and interpreted which demonstrates cardiomegaly and interstitial prominence likely interstitial edema.  No focal consolidation. Agree with radiology interpretation.  Given persistent tachycardia, hemoptysis and shortness of breath will obtain CTA to rule out PE and to rule out evidence of pneumonia.  Patient reassessed numerous times as noted above.  Patient with some improvement in breathing after breathing treatment.  CTA demonstrates groundglass opacities superimposed on mild pulmonary fibrosis likely atypical infection.  Also demonstrates aneurysm. No PE. Patient still requiring 6 L nasal cannula.  Will discuss with hospitalist for admission for atypical pneumonia and respiratory failure.  8:20 PM Discussed with Dr. Antionette Char with [facility] who agrees to admit patient.   Has PCP Hx pulmonary fibrosis       Final Clinical Impression(s) / ED Diagnoses Final diagnoses:  Acute on chronic respiratory failure with hypoxia  Atypical pneumonia    Rx / DC Orders ED Discharge Orders     None         Jesusita Oka 11/28/22 2023    Maia Plan, MD 11/29/22 [phone]

## 2022-11-28 NOTE — ED Notes (Signed)
Patient transported to CT 

## 2022-11-29 DIAGNOSIS — J189 Pneumonia, unspecified organism: Secondary | ICD-10-CM | POA: Diagnosis not present

## 2022-11-29 DIAGNOSIS — J9621 Acute and chronic respiratory failure with hypoxia: Secondary | ICD-10-CM | POA: Diagnosis not present

## 2022-11-29 LAB — BASIC METABOLIC PANEL
Anion gap: 9 (ref 5–15)
BUN: 17 mg/dL (ref 8–23)
CO2: 24 mmol/L (ref 22–32)
Calcium: 9 mg/dL (ref 8.9–10.3)
Chloride: 107 mmol/L (ref 98–111)
Creatinine, Ser: 0.8 mg/dL (ref 0.44–1.00)
GFR, Estimated: >60 mL/min (ref >60)
Glucose, Bld: 196 mg/dL — ABNORMAL HIGH (ref 70–99)
Potassium: 3.9 mmol/L (ref 3.5–5.1)
Sodium: 140 mmol/L (ref 135–145)

## 2022-11-29 LAB — CBC
HCT: 38.1 % (ref 36.0–46.0)
Hemoglobin: 12.7 g/dL (ref 12.0–15.0)
MCH: 30 pg (ref 26.0–34.0)
MCHC: 33.3 g/dL (ref 30.0–36.0)
MCV: 90.1 fL (ref 80.0–100.0)
Platelets: 189 10*3/uL (ref 150–400)
RBC: 4.23 MIL/uL (ref 3.87–5.11)
RDW: 13.5 % (ref 11.5–15.5)
WBC: 11.3 10*3/uL — ABNORMAL HIGH (ref 4.0–10.5)
nRBC: 0 % (ref 0.0–0.2)

## 2022-11-29 LAB — MAGNESIUM: Magnesium: 2.3 mg/dL (ref 1.7–2.4)

## 2022-11-29 LAB — PROCALCITONIN
Procalcitonin: <0.1 ng/mL
Procalcitonin: <0.1 ng/mL

## 2022-11-29 LAB — BRAIN NATRIURETIC PEPTIDE: B Natriuretic Peptide: 216.1 pg/mL — ABNORMAL HIGH (ref 0.0–100.0)

## 2022-11-29 LAB — STREP PNEUMONIAE URINARY ANTIGEN: Strep Pneumo Urinary Antigen: NEGATIVE

## 2022-11-29 MED ORDER — METHYLPREDNISOLONE SODIUM SUCC 125 MG IJ SOLR
40.0000 mg | Freq: Two times a day (BID) | INTRAMUSCULAR | Status: DC
  Administered 2022-11-29 – 2022-12-02 (×7): 40 mg via INTRAVENOUS
  Filled 2022-11-29 (×7): qty 2

## 2022-11-29 MED ORDER — FUROSEMIDE 10 MG/ML IJ SOLN
40.0000 mg | Freq: Once | INTRAMUSCULAR | Status: AC
  Administered 2022-11-29: 40 mg via INTRAVENOUS
  Filled 2022-11-29: qty 4

## 2022-11-29 NOTE — ED Notes (Signed)
ED TO INPATIENT HANDOFF REPORT  ED Nurse Name and Phone #:  [phone]  S Name/Age/Gender Vernetta Honey 72 y.o. female Room/Bed: 023C/023C  Code Status   Code Status: Full Code  Home/SNF/Other Home Patient oriented to: self, place, time, and situation Is this baseline? Yes   Triage Complete: Triage complete  Chief Complaint Pneumonia [J18.9]  Triage Note Pt c/o SOB, HA started this morning. Pt states had hemoptysis started today. Pt states at home her Sa02 was 51% on her 2L 02 she wears at baseline.    Allergies Allergies  Allergen Reactions   Oxycodone Hcl Itching   Levaquin [Levofloxacin In D5w] Itching and Rash   Percocet [Oxycodone-Acetaminophen] Itching    Level of Care/Admitting Diagnosis ED Disposition     ED Disposition  Admit   Condition  --   Comment  Hospital Area: [facility] [[id number]]  Level of Care: Progressive [102]  Admit to Progressive based on following criteria: RESPIRATORY PROBLEMS hypoxemic/hypercapnic respiratory failure that is responsive to NIPPV (BiPAP) or High Flow Nasal Cannula (6-80 lpm). Frequent assessment/intervention, no > Q2 hrs < Q4 hrs, to maintain oxygenation and pulmonary hygiene.  May admit patient to [facility] or [facility] if equivalent level of care is available:: Yes  Covid Evaluation: Confirmed COVID Negative  Diagnosis: Pneumonia [227785]  Admitting Physician: Briscoe Deutscher [[id number]]  Attending Physician: Briscoe Deutscher [[id number]]  Certification:: I certify this patient will need inpatient services for at least 2 midnights  Estimated Length of Stay: 3          B Medical/Surgery History Past Medical History:  Diagnosis Date   Chronic diastolic CHF (congestive heart failure)    Chronic respiratory failure    Coronary atherosclerosis    a. 2V by CT 10/2017.   Emphysema of lung    Hypercholesterolemia    Hypertension    NSIP (nonspecific interstitial pneumonia)    Osteoarthritis     Persistent atrial fibrillation    Thoracic aortic ectasia    Past Surgical History:  Procedure Laterality Date   BIOPSY  12/10/2021   Procedure: BIOPSY;  Surgeon: Meridee Score Netty Starring., MD;  Location: [facility];  Service: Gastroenterology;;   CARDIOVERSION N/A 07/25/2016   Procedure: CARDIOVERSION;  Surgeon: Pricilla Riffle, MD;  Location: [facility] ENDOSCOPY;  Service: Cardiovascular;  Laterality: N/A;   CARDIOVERSION N/A 09/14/2016   Procedure: CARDIOVERSION;  Surgeon: Hillis Range, MD;  Location: [facility] OR;  Service: Cardiovascular;  Laterality: N/A;   CATARACT EXTRACTION Bilateral    ESOPHAGOGASTRODUODENOSCOPY (EGD) WITH PROPOFOL N/A 12/10/2021   Procedure: ESOPHAGOGASTRODUODENOSCOPY (EGD) WITH PROPOFOL;  Surgeon: Lemar Lofty., MD;  Location: [facility] ENDOSCOPY;  Service: Gastroenterology;  Laterality: N/A;   EUS N/A 12/10/2021   Procedure: UPPER ENDOSCOPIC ULTRASOUND (EUS) RADIAL;  Surgeon: Lemar Lofty., MD;  Location: [facility] ENDOSCOPY;  Service: Gastroenterology;  Laterality: N/A;   FINE NEEDLE ASPIRATION  12/10/2021   Procedure: FINE NEEDLE ASPIRATION;  Surgeon: Meridee Score Netty Starring., MD;  Location: [facility] ENDOSCOPY;  Service: Gastroenterology;;   LEG SURGERY Right    TEE WITHOUT CARDIOVERSION N/A 07/25/2016   Procedure: TRANSESOPHAGEAL ECHOCARDIOGRAM (TEE);  Surgeon: Pricilla Riffle, MD;  Location: [facility] ENDOSCOPY;  Service: Cardiovascular;  Laterality: N/A;     A IV Location/Drains/Wounds Patient Lines/Drains/Airways Status     Active Line/Drains/Airways     Name Placement date Placement time Site Days   Peripheral IV 11/28/22 20 G Right Antecubital 11/28/22  1628  Antecubital  1   Peripheral IV 11/28/22  20 G Anterior;Distal;Left;Upper Arm 11/28/22  1702  Arm  1            Intake/Output Last 24 hours  Intake/Output Summary (Last 24 hours) at 11/29/2022 0208 Last data filed at 11/29/2022 0017 Gross per 24 hour  Intake 700 ml  Output --  Net 700 ml    Labs/Imaging Results  for orders placed or performed during the hospital encounter of 11/28/22 (from the past 48 hour(s))  Comprehensive metabolic panel     Status: Abnormal   Collection Time: 11/28/22  2:31 PM  Result Value Ref Range   Sodium 142 135 - 145 mmol/L   Potassium 3.7 3.5 - 5.1 mmol/L   Chloride 102 98 - 111 mmol/L   CO2 27 22 - 32 mmol/L   Glucose, Bld 128 (H) 70 - 99 mg/dL    Comment: Glucose reference range applies only to samples taken after fasting for at least 8 hours.   BUN 15 8 - 23 mg/dL   Creatinine, Ser 1.61 0.44 - 1.00 mg/dL   Calcium 09.6 8.9 - 04.5 mg/dL   Total Protein 8.1 6.5 - 8.1 g/dL   Albumin 3.5 3.5 - 5.0 g/dL   AST 21 15 - 41 U/L   ALT 15 0 - 44 U/L   Alkaline Phosphatase 100 38 - 126 U/L   Total Bilirubin 0.8 0.3 - 1.2 mg/dL   GFR, Estimated >40 >98 mL/min    Comment: (NOTE) Calculated using the CKD-EPI Creatinine Equation (2021)    Anion gap 13 5 - 15    Comment: Performed at [facility], [address] [place], [place] [address]  CBC with Differential     Status: Abnormal   Collection Time: 11/28/22  2:31 PM  Result Value Ref Range   WBC 10.6 (H) 4.0 - 10.5 K/uL   RBC 4.80 3.87 - 5.11 MIL/uL   Hemoglobin 14.2 12.0 - 15.0 g/dL   HCT 78.2 95.6 - 21.3 %   MCV 90.2 80.0 - 100.0 fL   MCH 29.6 26.0 - 34.0 pg   MCHC 32.8 30.0 - 36.0 g/dL   RDW 08.6 57.8 - 46.9 %   Platelets 208 150 - 400 K/uL   nRBC 0.0 0.0 - 0.2 %   Neutrophils Relative % 79 %   Neutro Abs 8.4 (H) 1.7 - 7.7 K/uL   Lymphocytes Relative 8 %   Lymphs Abs 0.9 0.7 - 4.0 K/uL   Monocytes Relative 10 %   Monocytes Absolute 1.1 (H) 0.1 - 1.0 K/uL   Eosinophils Relative 2 %   Eosinophils Absolute 0.2 0.0 - 0.5 K/uL   Basophils Relative 1 %   Basophils Absolute 0.1 0.0 - 0.1 K/uL   Immature Granulocytes 0 %   Abs Immature Granulocytes 0.04 0.00 - 0.07 K/uL    Comment: Performed at [facility], [address] [place], [place] [address]  Protime-INR     Status: Abnormal   Collection  Time: 11/28/22  2:31 PM  Result Value Ref Range   Prothrombin Time 16.0 (H) 11.4 - 15.2 seconds   INR 1.3 (H) 0.8 - 1.2    Comment: (NOTE) INR goal varies based on device and disease states. Performed at [facility], [address] [place], [place] [address]   APTT     Status: Abnormal   Collection Time: 11/28/22  2:31 PM  Result Value Ref Range   aPTT 40 (H) 24 - 36 seconds    Comment:  IF BASELINE aPTT IS ELEVATED, SUGGEST PATIENT RISK ASSESSMENT BE USED TO DETERMINE APPROPRIATE ANTICOAGULANT THERAPY. Performed at [facility], [address] [place], [place] [address]   Urinalysis, w/ Reflex to Culture (Infection Suspected) -Urine, Clean Catch     Status: Abnormal   Collection Time: 11/28/22  2:31 PM  Result Value Ref Range   Specimen Source URINE, CLEAN CATCH    Color, Urine YELLOW YELLOW   APPearance HAZY (A) CLEAR   Specific Gravity, Urine 1.011 1.005 - 1.030   pH 7.0 5.0 - 8.0   Glucose, UA NEGATIVE NEGATIVE mg/dL   Hgb urine dipstick SMALL (A) NEGATIVE   Bilirubin Urine NEGATIVE NEGATIVE   Ketones, ur NEGATIVE NEGATIVE mg/dL   Protein, ur 30 (A) NEGATIVE mg/dL   Nitrite NEGATIVE NEGATIVE   Leukocytes,Ua MODERATE (A) NEGATIVE   RBC / HPF 6-10 0 - 5 RBC/hpf   WBC, UA 11-20 0 - 5 WBC/hpf    Comment:        Reflex urine culture not performed if WBC <=10, OR if Squamous epithelial cells >5. If Squamous epithelial cells >5 suggest recollection.    Bacteria, UA NONE SEEN NONE SEEN   Squamous Epithelial / HPF 0-5 0 - 5 /HPF    Comment: Performed at [facility], [address] [place], [place] [address]  Brain natriuretic peptide     Status: None   Collection Time: 11/28/22  2:31 PM  Result Value Ref Range   B Natriuretic Peptide 55.8 0.0 - 100.0 pg/mL    Comment: Performed at [facility], [address] [place], [place] [address]  Troponin I (High Sensitivity)     Status: Abnormal   Collection Time: 11/28/22  2:31 PM  Result  Value Ref Range   Troponin I (High Sensitivity) 20 (H) <18 ng/L    Comment: (NOTE) Elevated high sensitivity troponin I (hsTnI) values and significant  changes across serial measurements may suggest ACS but many other  chronic and acute conditions are known to elevate hsTnI results.  Refer to the "Links" section for chest pain algorithms and additional  guidance. Performed at [facility], [address] [place], [place] [address]   Urine Culture     Status: None (Preliminary result)   Collection Time: 11/28/22  2:31 PM   Specimen: Urine, Clean Catch  Result Value Ref Range   Specimen Description URINE, CLEAN CATCH    Special Requests      NONE Reflexed from [id number] Performed at [facility], [address] [place], [place] [address]    Culture PENDING    Report Status PENDING   Resp panel by RT-PCR (RSV, Flu A&B, Covid)     Status: None   Collection Time: 11/28/22  2:32 PM   Specimen: Nasal Swab  Result Value Ref Range   SARS Coronavirus 2 by RT PCR NEGATIVE NEGATIVE   Influenza A by PCR NEGATIVE NEGATIVE   Influenza B by PCR NEGATIVE NEGATIVE    Comment: (NOTE) The Xpert Xpress SARS-CoV-2/FLU/RSV plus assay is intended as an aid in the diagnosis of influenza from Nasopharyngeal swab specimens and should not be used as a sole basis for treatment. Nasal washings and aspirates are unacceptable for Xpert Xpress SARS-CoV-2/FLU/RSV testing.  Fact Sheet for Patients: BloggerCourse.comhttps://www.fda.gov/media/152166/download  Fact Sheet for Healthcare Providers: SeriousBroker.ithttps://www.fda.gov/media/152162/download  This test is not yet approved or cleared by the [place] FDA and has been authorized for detection and/or diagnosis of SARS-CoV-2 by  FDA under an Emergency Use Authorization (EUA). This EUA will remain in effect (meaning this test can be used) for the duration of the COVID-19 declaration under Section 564(b)(1) of the Act, 21 U.S.C. section 360bbb-3(b)(1), unless the  authorization is terminated or revoked.     Resp Syncytial Virus by PCR NEGATIVE NEGATIVE    Comment: (NOTE) Fact Sheet for Patients: BloggerCourse.com  Fact Sheet for Healthcare Providers: SeriousBroker.it  This test is not yet approved or cleared by the [place] FDA and has been authorized for detection and/or diagnosis of SARS-CoV-2 by FDA under an Emergency Use Authorization (EUA). This EUA will remain in effect (meaning this test can be used) for the duration of the COVID-19 declaration under Section 564(b)(1) of the Act, 21 U.S.C. section 360bbb-3(b)(1), unless the authorization is terminated or revoked.  Performed at [facility], [address], [place], [place] [address]   Lactic acid, plasma     Status: None   Collection Time: 11/28/22  4:09 PM  Result Value Ref Range   Lactic Acid, Venous 1.5 0.5 - 1.9 mmol/L    Comment: Performed at [facility], [address] [place], [place] [address]  Lactic acid, plasma     Status: None   Collection Time: 11/28/22  7:00 PM  Result Value Ref Range   Lactic Acid, Venous 1.8 0.5 - 1.9 mmol/L    Comment: Performed at [facility], [address], [place], [place] [address]  Troponin I (High Sensitivity)     Status: Abnormal   Collection Time: 11/28/22  7:00 PM  Result Value Ref Range   Troponin I (High Sensitivity) 25 (H) <18 ng/L    Comment: (NOTE) Elevated high sensitivity troponin I (hsTnI) values and significant  changes across serial measurements may suggest ACS but many other  chronic and acute conditions are known to elevate hsTnI results.  Refer to the "Links" section for chest pain algorithms and additional  guidance. Performed at [facility], [address] [place], [place] [address]   Procalcitonin     Status: None   Collection Time: 11/28/22  7:00 PM  Result Value Ref Range   Procalcitonin <0.10 ng/mL    Comment:         Interpretation: PCT (Procalcitonin) <= 0.5 ng/mL: Systemic infection (sepsis) is not likely. Local bacterial infection is possible. (NOTE)       Sepsis PCT Algorithm           Lower Respiratory Tract                                      Infection PCT Algorithm    ----------------------------     ----------------------------         PCT < 0.25 ng/mL                PCT < 0.10 ng/mL          Strongly encourage             Strongly discourage   discontinuation of antibiotics    initiation of antibiotics    ----------------------------     -----------------------------       PCT 0.25 - 0.50 ng/mL            PCT 0.10 - 0.25 ng/mL               OR       >  80% decrease in PCT            Discourage initiation of                                            antibiotics      Encourage discontinuation           of antibiotics    ----------------------------     -----------------------------         PCT >= 0.50 ng/mL              PCT 0.26 - 0.50 ng/mL               AND        <80% decrease in PCT             Encourage initiation of                                             antibiotics       Encourage continuation           of antibiotics    ----------------------------     -----------------------------        PCT >= 0.50 ng/mL                  PCT > 0.50 ng/mL               AND         increase in PCT                  Strongly encourage                                      initiation of antibiotics    Strongly encourage escalation           of antibiotics                                     -----------------------------                                           PCT <= 0.25 ng/mL                                                 OR                                        > 80% decrease in PCT                                      Discontinue / Do not initiate  antibiotics  Performed at [facility], [address], [name], [place] [address]    Basic metabolic panel     Status: Abnormal   Collection Time: 11/29/22 12:53 AM  Result Value Ref Range   Sodium 140 135 - 145 mmol/L   Potassium 3.9 3.5 - 5.1 mmol/L   Chloride 107 98 - 111 mmol/L   CO2 24 22 - 32 mmol/L   Glucose, Bld 196 (H) 70 - 99 mg/dL    Comment: Glucose reference range applies only to samples taken after fasting for at least 8 hours.   BUN 17 8 - 23 mg/dL   Creatinine, Ser 8.89 0.44 - 1.00 mg/dL   Calcium 9.0 8.9 - 16.9 mg/dL   GFR, Estimated >45 >03 mL/min    Comment: (NOTE) Calculated using the CKD-EPI Creatinine Equation (2021)    Anion gap 9 5 - 15    Comment: Performed at [facility], [address] [place], [place] [address]  Magnesium     Status: None   Collection Time: 11/29/22 12:53 AM  Result Value Ref Range   Magnesium 2.3 1.7 - 2.4 mg/dL    Comment: Performed at [facility], [address], [place], [place] [address]  CBC     Status: Abnormal   Collection Time: 11/29/22 12:53 AM  Result Value Ref Range   WBC 11.3 (H) 4.0 - 10.5 K/uL   RBC 4.23 3.87 - 5.11 MIL/uL   Hemoglobin 12.7 12.0 - 15.0 g/dL   HCT 17.9 15.0 - 56.9 %   MCV 90.1 80.0 - 100.0 fL   MCH 30.0 26.0 - 34.0 pg   MCHC 33.3 30.0 - 36.0 g/dL   RDW 79.4 80.1 - 65.5 %   Platelets 189 150 - 400 K/uL   nRBC 0.0 0.0 - 0.2 %    Comment: Performed at [facility], [address] [place], [place] [address]   CT Angio Chest PE W and/or Wo Contrast  Result Date: 11/28/2022 CLINICAL DATA:  Shortness of breath and headache.  Hemoptysis today. EXAM: CT ANGIOGRAPHY CHEST WITH CONTRAST TECHNIQUE: Multidetector CT imaging of the chest was performed using the standard protocol during bolus administration of intravenous contrast. Multiplanar CT image reconstructions and MIPs were obtained to evaluate the vascular anatomy. RADIATION DOSE REDUCTION: This exam was performed according to the departmental dose-optimization program which includes automated exposure control,  adjustment of the mA and/or kV according to patient size and/or use of iterative reconstruction technique. CONTRAST:  6mL OMNIPAQUE IOHEXOL 350 MG/ML SOLN COMPARISON:  Radiographs earlier today and CTA chest 07/16/2022 FINDINGS: Cardiovascular: Satisfactory opacification of the central pulmonary arteries. The segmental and subsegmental lower lobe pulmonary arteries are not well evaluated due to motion. No evidence of central pulmonary embolism. Normal heart size. No pericardial effusion. Coronary artery and aortic atherosclerotic calcification. 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 Mediastinum/Nodes: No enlarged mediastinal, hilar, or axillary lymph nodes. Thyroid gland, trachea, and esophagus demonstrate no significant findings. Lungs/Pleura: Low lung volumes. Diffuse patchy ground-glass opacities and interlobular septal thickening are new since 07/16/2022. This is superimposed on a background of mild pulmonary fibrosis with a apical to basal gradient and peripheral reticular opacities. No pleural effusion or pneumothorax. Right upper lobe bronchiolectasis. Upper Abdomen:  No acute abnormality. Musculoskeletal: No chest wall abnormality. No acute osseous findings. Review of the MIP images confirms the above findings. IMPRESSION: 1. No evidence of central pulmonary embolism. The segmental and subsegmental lower lobe pulmonary arteries are not well evaluated due to motion. 2. Diffuse patchy ground-glass opacities superimposed on a background of mild pulmonary fibrosis. Findings are favored to represent atypical infection. 3. 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 Recommend semi-annual imaging followup by CTA or MRA and referral to cardiothoracic surgery if not already obtained. This recommendation follows 2010 ACCF/AHA/AATS/ACR/ASA/SCA/SCAI/SIR/STS/SVM Guidelines for the Diagnosis and Management of Patients With Thoracic Aortic Disease. Circulation. 2010; 121: W098-J191. Aortic aneurysm NOS (ICD10-I71.9) 4. 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 Aortic Atherosclerosis (ICD10-I70.0). Electronically Signed   By: Minerva Fester M.D.   On: 11/28/2022 19:52   DG Chest Port 1 View  Result Date: 11/28/2022 CLINICAL DATA:  Question sepsis. EXAM: PORTABLE CHEST 1 VIEW COMPARISON:  Chest CT 07/16/2022 FINDINGS: Stable enlarged cardiac silhouette ectatic aorta. There is bilateral interstitial prominence. Low lung volumes. No pneumothorax. No focal consolidation IMPRESSION: Cardiomegaly and interstitial prominence suggest interstitial edema. Electronically Signed   By: Genevive Bi M.D.   On: 11/28/2022 14:59    Pending Labs Unresulted Labs (From admission, onward)     Start     Ordered   11/29/22 0500  Basic metabolic panel  Daily,   R      11/28/22 2031   11/29/22 0500  CBC  Daily,   R      11/28/22 2031   11/29/22 0500  Procalcitonin  Daily,   R     References:    Procalcitonin Lower Respiratory Tract Infection AND Sepsis Procalcitonin Algorithm   11/28/22 2031   11/28/22 2030  Expectorated Sputum Assessment w Gram Stain, Rflx to Resp Cult  (COPD / Pneumonia / Cellulitis / Lower Extremity Wound)  Once,   R        11/28/22 2031   11/28/22 1846  MRSA Next Gen by PCR, Nasal  (MRSA Screening)  Once,   URGENT        11/28/22 1845   11/28/22 1431  Blood Culture (routine x 2)  (Undifferentiated presentation (screening labs and basic nursing orders))  BLOOD CULTURE X 2,   STAT      11/28/22 1431             Vitals/Pain Today's Vitals   11/28/22 2330 11/29/22 0000 11/29/22 0020 11/29/22 0030  BP: (!) 143/100 (!) 136/96  138/88  Pulse: (!) 103 82  75  Resp: (!) 25 (!) 28  (!) 32  Temp:  99.6 F (37.6 C)    TempSrc:  Oral    SpO2: 93% 93%  95%  Weight:      Height:      PainSc:   5      Isolation Precautions No active isolations  Medications Medications  HYDROcodone-acetaminophen (NORCO/VICODIN) 5-325 MG per tablet 1 tablet (1 tablet Oral Given 11/28/22 2221)  atorvastatin (LIPITOR) tablet 40 mg (has no administration in time range)  diltiazem (CARDIZEM CD) 24 hr capsule 240 mg (has no administration in time range)  dofetilide (TIKOSYN) capsule 500 mcg (500 mcg Oral Given 11/28/22 2309)  irbesartan (AVAPRO) tablet 300 mg (has no administration in time range)  LORazepam (ATIVAN) tablet 1 mg (  has no administration in time range)  apixaban (ELIQUIS) tablet 5 mg (5 mg Oral Given 11/28/22 2213)  cefTRIAXone (ROCEPHIN) 2 g in sodium chloride 0.9 % 100 mL IVPB (0 g Intravenous Stopped 11/29/22 0017)  sodium chloride flush (NS) 0.9 % injection 3 mL (3 mLs Intravenous Not Given 11/28/22 2224)  acetaminophen (TYLENOL) tablet 650 mg (has no administration in time range)    Or  acetaminophen (TYLENOL) suppository 650 mg (has no administration in time range)  polyethylene glycol (MIRALAX / GLYCOLAX) packet 17 g (has no administration in time range)  doxycycline (VIBRAMYCIN) 100 mg in sodium chloride 0.9 % 250 mL IVPB (0 mg Intravenous Stopped 11/29/22 0016)  methylPREDNISolone sodium succinate (SOLU-MEDROL) 125 mg/2 mL injection 125 mg (125 mg Intravenous Given 11/28/22 1634)  ipratropium-albuterol (DUONEB) 0.5-2.5 (3) MG/3ML nebulizer solution 3 mL (3 mLs Nebulization Given 11/28/22 1637)  lactated ringers bolus 1,000 mL (0 mLs Intravenous Stopped 11/28/22 1806)  ceFEPIme (MAXIPIME) 2 g in sodium chloride 0.9 % 100 mL IVPB (0 g Intravenous Stopped 11/28/22 1806)  metroNIDAZOLE (FLAGYL) IVPB 500 mg (0 mg  Intravenous Stopped 11/28/22 1723)  vancomycin (VANCOREADY) IVPB 1750 mg/350 mL (0 mg Intravenous Stopped 11/28/22 2212)  iohexol (OMNIPAQUE) 350 MG/ML injection 80 mL (80 mLs Intravenous Contrast Given 11/28/22 1932)  potassium chloride SA (KLOR-CON M) CR tablet 40 mEq (40 mEq Oral Given 11/28/22 2213)    Mobility walks with device     Focused Assessments Pulmonary Assessment Handoff:  Lung sounds: Bilateral Breath Sounds: Coarse crackles, Diminished O2 Device: Bi-PAP O2 Flow Rate (L/min): 6 L/min    R Recommendations: See Admitting Provider Note  Report given to:   Additional Notes:  Please call if  you have any questions

## 2022-11-29 NOTE — Progress Notes (Signed)
PT Cancellation Note  Patient Details Name: [name] MRN: [id number] DOB: 07-26-1951   Cancelled Treatment:    Reason Eval/Treat Not Completed: Medical issues which prohibited therapy this afternoon. Per discussion with RN, pt remains on 15L O2 with elevated RR. RN asked PT to hold evaluation until tomorrow.   Vickki Muff, PT, DPT   Acute Rehabilitation Department Office [phone] Secure Chat Communication Preferred   Ronnie Derby 11/29/2022, 3:55 PM

## 2022-11-29 NOTE — Progress Notes (Addendum)
PROGRESS NOTE    [name]  WUX:[id number] DOB: June 14, 1951 DOA: 11/28/2022 PCP: Johny BlamerHarris, William, MD  Chief Complaint  Patient presents with   Shortness of Breath    Brief Narrative:   [name] is [name] pleasant 72 y.o. female with medical history significant for atrial fibrillation on Eliquis, pulmonary fibrosis with chronic hypoxic respiratory failure, hypertension, anxiety, and chronic pain who presents to the emergency department with cough, shortness of breath, and fevers.   She's being treated for pneumonia.  Pulmonology c/s.    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 Sepsis due to Community Acquired Pneumonia  acute on chronic hypoxic respiratory failure pulmonary fibrosis  - on 2-4 L at baseline - currently 15 L [place] satting in low 90's, mildly tachypneic  - meets sepsis criteria with fever, tachypnea, tachycardia, pna  - CT chest with diffuse patchy GGO superimposed on [name] background of mild pulm fibrosis - negative covid, flu, RSV - follow RVP, sputum cx, MRSA, urine strep, urine legionella - continue steroids and antibiotics (procal is <0.1, but given severity of hypoxia with fever will continue course of abx for pna) - will consult pulmonology - wean O2 as tolerated    2. OSA  - Continue BiPAP qHS     3. Atrial fibrillation  - Continue Eliquis, Tikosyn, and diltiazem     4. Hypertension  - Continue diltiazem and irbesartan     5. Chronic pain; anxiety  - Prescription database reviewed, plan to continue Norco and Ativan as at home     6. Hemoptysis  - Scant, no central PE on CTA chest  - Likely d/t infection, continue  antibiotics as above, cautiously continue Eliquis    7. Thoracic aorta ectasia  - Noted on CTA in ED  - Continue CT surgery follow-up as planned     DVT prophylaxis: eliquis Code Status: full Family Communication: none Disposition:   Status is: Inpatient Remains inpatient appropriate because: continued hypoxia    Consultants:  pulmonology  Procedures:  none  Antimicrobials:  Anti-infectives (From admission, onward)    Start     Dose/Rate Route Frequency Ordered Stop   11/29/22 1800  vancomycin (VANCOREADY) IVPB 750 mg/150 mL  Status:  Discontinued        750 mg 150 mL/hr over 60 Minutes Intravenous Every 24 hours 11/28/22 1844 11/28/22 2031   11/29/22 0600  ceFEPIme (MAXIPIME) 2 g in sodium chloride 0.9 % 100 mL IVPB  Status:  Discontinued        2 g 200 mL/hr over 30 Minutes Intravenous Every 12 hours 11/28/22 1844 11/28/22 2031   11/28/22 2300  cefTRIAXone (ROCEPHIN) 2 g in sodium chloride 0.9 % 100 mL IVPB        2 g 200 mL/hr over 30 Minutes Intravenous Every 24 hours 11/28/22 2031 12/03/22 2259   11/28/22 2200  doxycycline (VIBRAMYCIN) 100 mg in sodium chloride 0.9 % 250 mL IVPB        100 mg 125 mL/hr over 120 Minutes Intravenous Every 12 hours 11/28/22 2048     11/28/22 2045  azithromycin (ZITHROMAX) 500 mg in sodium chloride 0.9 %  250 mL IVPB  Status:  Discontinued        500 mg 250 mL/hr over 60 Minutes Intravenous Every 24 hours 11/28/22 2031 11/28/22 2048   11/28/22 1600  ceFEPIme (MAXIPIME) 2 g in sodium chloride 0.9 % 100 mL IVPB        2 g 200 mL/hr over 30 Minutes Intravenous  Once 11/28/22 1547 11/28/22 1806   11/28/22 1600  metroNIDAZOLE (FLAGYL) IVPB 500 mg        500 mg 100 mL/hr over 60 Minutes Intravenous  Once 11/28/22 1547 11/28/22 1723   11/28/22 1600  vancomycin (VANCOCIN) IVPB 1000 mg/200 mL premix  Status:  Discontinued        1,000 mg 200 mL/hr over 60 Minutes Intravenous  Once 11/28/22 1547 11/28/22 1550   11/28/22 1600  vancomycin  (VANCOREADY) IVPB 1750 mg/350 mL        1,750 mg 175 mL/hr over 120 Minutes Intravenous  Once 11/28/22 1550 11/28/22 2212       Subjective: SOB for several days  Objective: Vitals:   11/29/22 0300 11/29/22 0420 11/29/22 0800 11/29/22 0806  BP:   (!) 165/99   Pulse: 80  82 83  Resp: (!) 30  (!) 23 (!) 39  Temp:   97.6 F (36.4 C)   TempSrc:   Oral   SpO2: 93%  90% 91%  Weight:  88.7 kg    Height:        Intake/Output Summary (Last 24 hours) at 11/29/2022 0827 Last data filed at 11/29/2022 0427 Gross per 24 hour  Intake 700 ml  Output --  Net 700 ml   Filed Weights   11/28/22 1420 11/29/22 0420  Weight: 88.9 kg 88.7 kg    Examination:  General exam: Appears calm and comfortable  Respiratory system: coarse breath sounds bilaterally  Cardiovascular system: RRR Gastrointestinal system: Abdomen is nondistended, soft and nontender Central nervous system: Alert and oriented. No focal neurological deficits. Extremities: no LEE    Data Reviewed: I have personally reviewed following labs and imaging studies  CBC: Recent Labs  Lab 11/28/22 1431 11/29/22 0053  WBC 10.6* 11.3*  NEUTROABS 8.4*  --   HGB 14.2 12.7  HCT 43.3 38.1  MCV 90.2 90.1  PLT 208 189    Basic Metabolic Panel: Recent Labs  Lab 11/28/22 1431 11/29/22 0053  NA 142 140  K 3.7 3.9  CL 102 107  CO2 27 24  GLUCOSE 128* 196*  BUN 15 17  CREATININE 0.93 0.80  CALCIUM 10.0 9.0  MG  --  2.3    GFR: Estimated Creatinine Clearance: 63 mL/min (by C-G formula based on SCr of 0.8 mg/dL).  Liver Function Tests: Recent Labs  Lab 11/28/22 1431  AST 21  ALT 15  ALKPHOS 100  BILITOT 0.8  PROT 8.1  ALBUMIN 3.5    CBG: No results for input(s): "GLUCAP" in the last 168 hours.   Recent Results (from the past 240 hour(s))  Urine Culture     Status: None (Preliminary result)   Collection Time: 11/28/22  2:31 PM   Specimen: Urine, Clean Catch  Result Value Ref Range Status   Specimen  Description URINE, CLEAN CATCH  Final   Special Requests   Final    NONE Reflexed from [id number] Performed at [facility] Lab, [address] [place], [place] [address]    Culture PENDING  Incomplete   Report Status PENDING  Incomplete  Resp panel by RT-PCR (RSV, Flu Jennavieve Arrick&B,  Covid)     Status: None   Collection Time: 11/28/22  2:32 PM   Specimen: Nasal Swab  Result Value Ref Range Status   SARS Coronavirus 2 by RT PCR NEGATIVE NEGATIVE Final   Influenza Nadine Ryle by PCR NEGATIVE NEGATIVE Final   Influenza B by PCR NEGATIVE NEGATIVE Final    Comment: (NOTE) The Xpert Xpress SARS-CoV-2/FLU/RSV plus assay is intended as an aid in the diagnosis of influenza from Nasopharyngeal swab specimens and should not be used as Firman Petrow sole basis for treatment. Nasal washings and aspirates are unacceptable for Xpert Xpress SARS-CoV-2/FLU/RSV testing.  Fact Sheet for Patients: BloggerCourse.com  Fact Sheet for Healthcare Providers: SeriousBroker.it  This test is not yet approved or cleared by the [place] FDA and has been authorized for detection and/or diagnosis of SARS-CoV-2 by FDA under an Emergency Use Authorization (EUA). This EUA will remain in effect (meaning this test can be used) for the duration of the COVID-19 declaration under Section 564(b)(1) of the Act, 21 U.S.C. section 360bbb-3(b)(1), unless the authorization is terminated or revoked.     Resp Syncytial Virus by PCR NEGATIVE NEGATIVE Final    Comment: (NOTE) Fact Sheet for Patients: BloggerCourse.com  Fact Sheet for Healthcare Providers: SeriousBroker.it  This test is not yet approved or cleared by the [place] FDA and has been authorized for detection and/or diagnosis of SARS-CoV-2 by FDA under an Emergency Use Authorization (EUA). This EUA will remain in effect (meaning this test can be used) for the duration of the COVID-19  declaration under Section 564(b)(1) of the Act, 21 U.S.C. section 360bbb-3(b)(1), unless the authorization is terminated or revoked.  Performed at [facility], [address] [place], [place] [address]          Radiology Studies: CT Angio Chest PE W and/or Wo Contrast  Result Date: 11/28/2022 CLINICAL DATA:  Shortness of breath and headache.  Hemoptysis today. EXAM: CT ANGIOGRAPHY CHEST WITH CONTRAST TECHNIQUE: Multidetector CT imaging of the chest was performed using the standard protocol during bolus administration of intravenous contrast. Multiplanar CT image reconstructions and MIPs were obtained to evaluate the vascular anatomy. RADIATION DOSE REDUCTION: This exam was performed according to the departmental dose-optimization program which includes automated exposure control, adjustment of the mA and/or kV according to patient size and/or use of iterative reconstruction technique. CONTRAST:  69mL OMNIPAQUE IOHEXOL 350 MG/ML SOLN COMPARISON:  Radiographs earlier today and CTA chest 07/16/2022 FINDINGS: Cardiovascular: Satisfactory opacification of the central pulmonary arteries. The segmental and subsegmental lower lobe pulmonary arteries are not well evaluated due to motion. No evidence of central pulmonary embolism. Normal heart size. No pericardial effusion. Coronary artery and aortic atherosclerotic calcification. 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 Mediastinum/Nodes: No enlarged mediastinal, hilar, or axillary lymph nodes. Thyroid gland, trachea, and esophagus demonstrate no significant findings. Lungs/Pleura: Low lung volumes. Diffuse patchy ground-glass opacities and interlobular septal thickening are new since  07/16/2022. This is superimposed on Sabatino Williard background of mild pulmonary fibrosis with Trueman Worlds apical to basal gradient and peripheral reticular opacities. No pleural effusion or pneumothorax. Right upper lobe bronchiolectasis. Upper Abdomen: No acute abnormality. Musculoskeletal: No chest wall abnormality. No acute osseous findings. Review of the MIP images confirms the above findings. IMPRESSION: 1. No evidence of central pulmonary embolism.  The segmental and subsegmental lower lobe pulmonary arteries are not well evaluated due to motion. 2. Diffuse patchy ground-glass opacities superimposed on Ahnya Akre background of mild pulmonary fibrosis. Findings are favored to represent atypical infection. 3. 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 Recommend semi-annual imaging followup by CTA or MRA and referral to cardiothoracic surgery if not already obtained. This recommendation follows 2010 ACCF/AHA/AATS/ACR/ASA/SCA/SCAI/SIR/STS/SVM Guidelines for the Diagnosis and Management of Patients With Thoracic Aortic Disease. Circulation. 2010; 121: G677-C340. Aortic aneurysm NOS (ICD10-I71.9) 4. 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 Aortic Atherosclerosis (ICD10-I70.0). Electronically Signed   By: Minerva Fester M.D.   On: 11/28/2022 19:52   DG Chest Port 1 View  Result Date: 11/28/2022 CLINICAL DATA:  Question sepsis. EXAM: PORTABLE CHEST 1 VIEW COMPARISON:  Chest CT 07/16/2022 FINDINGS: Stable enlarged cardiac silhouette ectatic aorta. There is bilateral interstitial prominence. Low lung volumes. No pneumothorax. No focal consolidation IMPRESSION: Cardiomegaly and interstitial prominence suggest interstitial edema. Electronically Signed   By: Genevive Bi M.D.   On: 11/28/2022 14:59        Scheduled Meds:  apixaban  5  mg Oral BID   atorvastatin  40 mg Oral Daily   diltiazem  240 mg Oral Daily   dofetilide  500 mcg Oral BID   irbesartan  300 mg Oral Daily   methylPREDNISolone (SOLU-MEDROL) injection  40 mg Intravenous Q12H   sodium chloride flush  3 mL Intravenous Q12H   Continuous Infusions:  cefTRIAXone (ROCEPHIN)  IV Stopped (11/29/22 0017)   doxycycline (VIBRAMYCIN) IV Stopped (11/29/22 0016)     LOS: 1 day    Time spent: over 30 min - 40 min critical care time with ahrf, worsening needing 15 L HFNC    Lacretia Nicks, MD [facility]   To contact the attending provider between 7A-7P or the covering provider during after hours 7P-7A, please log into the web site [email or web address] and access using universal [place] password for that web site. If you do not have the password, please call the hospital operator.  11/29/2022, 8:27 AM

## 2022-11-29 NOTE — Progress Notes (Signed)
Pt placed on cpap at previous settings. tol well 

## 2022-11-29 NOTE — Consult Note (Signed)
NAME:  [name], MRN:  [id number], DOB:  10-Oct-1950, LOS: 1 ADMISSION DATE:  11/28/2022, CONSULTATION DATE: 11/29/2022 REFERRING MD: [facility], CHIEF COMPLAINT: Acute on chronic hypoxic respiratory with suspected pneumonia  History of Present Illness:  72 year old female who is chronically O2 dependent at 2 to 3 L nasal cannula daily basis.  She notes fever purulent sputum with blood-tinged dyspnea on exertion and increasing O2 needs while at home.  CT scan is negative for PE but does show nonspecific pulmonary fibrosis, groundglass opacity.  Pulmonary critical care asked to evaluate.  She is followed by Dr. Sandrea Hughs of pulmonary.  Pertinent  Medical History   Past Medical History:  Diagnosis Date   Chronic diastolic CHF (congestive heart failure)    Chronic respiratory failure    Coronary atherosclerosis    a. 2V by CT 10/2017.   Emphysema of lung    Hypercholesterolemia    Hypertension    NSIP (nonspecific interstitial pneumonia)    Osteoarthritis    Persistent atrial fibrillation    Thoracic aortic ectasia      Significant Hospital Events: Including procedures, antibiotic start and stop dates in addition to other pertinent events     Interim History / Subjective:  Pulmonary critical care called for increasing oxygen  Objective   Blood pressure (!) 165/99, pulse 83, temperature 97.6 F (36.4 C), temperature source Oral, resp. rate (!) 39, height 5' (1.524 m), weight 88.7 kg, SpO2 91 %.        Intake/Output Summary (Last 24 hours) at 11/29/2022 1010 Last data filed at 11/29/2022 0427 Gross per 24 hour  Intake 700 ml  Output --  Net 700 ml   Filed Weights   11/28/22 1420 11/29/22 0420  Weight: 88.9 kg 88.7 kg    Examination: General: Morbidly obese female who is sitting up in bed eating HENT: No JVD or lymphadenopathy is appreciated Lungs: Decreased air movement on 15 L nasal cannula with sats of 94% Cardiovascular: Heart sounds regular regular blood pressure  140/80 Abdomen: Soft obese positive bowel sounds Extremities: Lower extremity edema is appreciated Neuro: Intact without focal defect GU: Currently with pure wick  Resolved Hospital Problem list     Assessment & Plan:  Acute on chronic hypoxic respiratory failure normally on 2 L to 3 L daily , obstructive sleep apnea , nonspecific interstitial pulmonary fibrosis and she is followed by Dr. Sandrea Hughs of pulmonary division.  She presents with increasing shortness of breath dyspnea on exertion, fever, elevated white count with normal lactic acid.  She notes hemoptysis she is on Eliquis for atrial fibrillation.  She has x-rays and CT scan  groundglass opacity with emphysema and pulmonary fibrosis.  She is being treated with antimicrobial therapy but has required increasing FiO2 to 2 L to maintain adequate O2 saturations.  Pulmonary critical care asked to consult. Agree with empirical antimicrobial therapy Agree with steroids Agree with increasing FiO2 as needed She can use nocturnal BiPAP and as needed She has adequate blood pressure and appears to be volume overloaded on chest x-ray and lower extremity edema therefore we will give a dose of Lasix IV. Wean FiO2 as tolerated Chronic atrial fibrillation treated with Tikosyn and Eliquis Cardiac monitoring Stop Eliquis in the setting of hemoptysis when hemoptysis clears resume anticoagulation  Diabetes mellitus Sliding scale insulin protocol  Hypertension NSAIDs as needed  Anxiety Anxiolytics as needed  Glaucoma Per primary     Best Practice (right click and "Reselect all SmartList Selections" daily)  Diet/type: Regular consistency (see orders) DVT prophylaxis: DOAC GI prophylaxis: PPI Lines: N/A Foley:  N/A Code Status:  full code Last date of multidisciplinary goals of care discussion tbd  Labs   CBC: Recent Labs  Lab 11/28/22 1431 11/29/22 0053  WBC 10.6* 11.3*  NEUTROABS 8.4*  --   HGB 14.2 12.7  HCT 43.3 38.1   MCV 90.2 90.1  PLT 208 189    Basic Metabolic Panel: Recent Labs  Lab 11/28/22 1431 11/29/22 0053  NA 142 140  K 3.7 3.9  CL 102 107  CO2 27 24  GLUCOSE 128* 196*  BUN 15 17  CREATININE 0.93 0.80  CALCIUM 10.0 9.0  MG  --  2.3   GFR: Estimated Creatinine Clearance: 63 mL/min (by C-G formula based on SCr of 0.8 mg/dL). Recent Labs  Lab 11/28/22 1431 11/28/22 1609 11/28/22 1900 11/29/22 0053  PROCALCITON  --   --  <0.10 <0.10  WBC 10.6*  --   --  11.3*  LATICACIDVEN  --  1.5 1.8  --     Liver Function Tests: Recent Labs  Lab 11/28/22 1431  AST 21  ALT 15  ALKPHOS 100  BILITOT 0.8  PROT 8.1  ALBUMIN 3.5   No results for input(s): "LIPASE", "AMYLASE" in the last 168 hours. No results for input(s): "AMMONIA" in the last 168 hours.  ABG No results found for: "PHART", "PCO2ART", "PO2ART", "HCO3", "TCO2", "ACIDBASEDEF", "O2SAT"   Coagulation Profile: Recent Labs  Lab 11/28/22 1431  INR 1.3*    Cardiac Enzymes: No results for input(s): "CKTOTAL", "CKMB", "CKMBINDEX", "TROPONINI" in the last 168 hours.  HbA1C: Hgb A1c MFr Bld  Date/Time Value Ref Range Status  02/06/2016 04:47 AM 6.2 (H) 4.8 - 5.6 % Final    Comment:    (NOTE)         Pre-diabetes: 5.7 - 6.4         Diabetes: >6.4         Glycemic control for adults with diabetes: <7.0     CBG: No results for input(s): "GLUCAP" in the last 168 hours.  Review of Systems:   10 point review of system taken, please see HPI for positives and negatives. Positive productive cough with hemoptysis she is on Eliquis for atrial fibrillation Normally takes Lasix on a daily basis Positive for increasing shortness of breath dyspnea on exertion Positive for lower extremity    Past Medical History:  She,  has a past medical history of Chronic diastolic CHF (congestive heart failure), Chronic respiratory failure, Coronary atherosclerosis, Emphysema of lung, Hypercholesterolemia, Hypertension, NSIP  (nonspecific interstitial pneumonia), Osteoarthritis, Persistent atrial fibrillation, and Thoracic aortic ectasia.   Surgical History:   Past Surgical History:  Procedure Laterality Date   BIOPSY  12/10/2021   Procedure: BIOPSY;  Surgeon: Meridee Score Netty Starring., MD;  Location: [facility];  Service: Gastroenterology;;   CARDIOVERSION N/A 07/25/2016   Procedure: CARDIOVERSION;  Surgeon: Pricilla Riffle, MD;  Location: [facility] ENDOSCOPY;  Service: Cardiovascular;  Laterality: N/A;   CARDIOVERSION N/A 09/14/2016   Procedure: CARDIOVERSION;  Surgeon: Hillis Range, MD;  Location: [facility] OR;  Service: Cardiovascular;  Laterality: N/A;   CATARACT EXTRACTION Bilateral    ESOPHAGOGASTRODUODENOSCOPY (EGD) WITH PROPOFOL N/A 12/10/2021   Procedure: ESOPHAGOGASTRODUODENOSCOPY (EGD) WITH PROPOFOL;  Surgeon: Lemar Lofty., MD;  Location: [facility] ENDOSCOPY;  Service: Gastroenterology;  Laterality: N/A;   EUS N/A 12/10/2021   Procedure: UPPER ENDOSCOPIC ULTRASOUND (EUS) RADIAL;  Surgeon: Meridee Score Netty Starring., MD;  Location: [facility] ENDOSCOPY;  Service: Gastroenterology;  Laterality: N/A;   FINE NEEDLE ASPIRATION  12/10/2021   Procedure: FINE NEEDLE ASPIRATION;  Surgeon: Meridee Score Netty Starring., MD;  Location: [facility] ENDOSCOPY;  Service: Gastroenterology;;   LEG SURGERY Right    TEE WITHOUT CARDIOVERSION N/A 07/25/2016   Procedure: TRANSESOPHAGEAL ECHOCARDIOGRAM (TEE);  Surgeon: Pricilla Riffle, MD;  Location: [facility] ENDOSCOPY;  Service: Cardiovascular;  Laterality: N/A;     Social History:   reports that she quit smoking about 12 years ago. Her smoking use included cigarettes. She started smoking about 56 years ago. She has a 43.00 pack-year smoking history. She has never used smokeless tobacco. She reports that she does not drink alcohol and does not use drugs.   Family History:  Her family history includes Heart attack in her sister; Heart disease in her father. There is no history of Stroke.   Allergies Allergies   Allergen Reactions   Oxycodone Hcl Itching   Levaquin [Levofloxacin In D5w] Itching and Rash   Percocet [Oxycodone-Acetaminophen] Itching     Home Medications  Prior to Admission medications   Medication Sig Start Date End Date Taking? Authorizing Provider  ACCU-CHEK GUIDE test strip See admin instructions. 07/31/20   [provider]  acetaminophen (TYLENOL) 500 MG tablet Take 500 mg by mouth every 6 (six) hours as needed for moderate pain.    [provider]  apixaban (ELIQUIS) 5 MG TABS tablet Take 1 tablet (5 mg total) by mouth 2 (two) times daily. 09/27/22   Newman Nip, NP  ascorbic acid (VITAMIN C) 500 MG tablet Take 500 mg by mouth every other day.    [provider]  atorvastatin (LIPITOR) 40 MG tablet Take 1 tablet (40 mg total) by mouth daily. 07/30/22   Sharlene Dory, PA-C  brimonidine (ALPHAGAN) 0.2 % ophthalmic solution Place 1 drop into both eyes 2 (two) times daily. 05/11/20   [provider]  calcium citrate-vitamin D (CITRACAL+D) 315-200 MG-UNIT tablet Take 1 tablet by mouth daily.    [provider]  cholecalciferol (VITAMIN D3) 25 MCG (1000 UNIT) tablet Take 1,000 Units by mouth every other day.    [provider]  clotrimazole-betamethasone (LOTRISONE) cream Apply 1 Application topically 2 (two) times daily. 12/17/21   [provider]  diltiazem (CARDIZEM CD) 240 MG 24 hr capsule Take 1 capsule (240 mg total) by mouth daily. 05/09/22   Corky Crafts, MD  dofetilide (TIKOSYN) 500 MCG capsule TAKE ONE CAPSULE BY MOUTH TWICE DAILY 08/05/22   Newman Nip, NP  furosemide (LASIX) 40 MG tablet Take 1 tablet (40 mg total) by mouth daily. MUST [facility] FOLLOW UP APPT FOR FURTHER REFILLS 1ST ATTEMPT 02/22/20   Corky Crafts, MD  [facility] BILOBA COMPLEX PO Take 1 tablet by mouth every other day.    [provider]  HYDROcodone-acetaminophen (NORCO/VICODIN) 5-325 MG tablet Take 1 tablet by mouth every 6  (six) hours as needed for moderate pain.    [provider]  irbesartan (AVAPRO) 300 MG tablet Take 300 mg by mouth daily. 07/29/20   [provider]  LORazepam (ATIVAN) 1 MG tablet Take 1 mg by mouth daily as needed for anxiety. 01/05/16   [provider]  Multiple Vitamin (MULTIVITAMIN) capsule Take 1 capsule by mouth daily.    [provider]  Multiple Vitamins-Minerals (HAIR SKIN NAILS PO) Take 1 tablet by mouth every other day.    [provider]  OXYGEN 2lpm 24/7 and 4 lpm with exertion [facility]  [provider]  Oxymetazoline HCl (SINEX ULTRA FINE MIST 12-HOUR NA) Place 1 spray into the nose daily as needed (congestion).    [provider]  potassium chloride SA (KLOR-CON M) 20 MEQ tablet TAKE ONE TABLET BY MOUTH ONCE DAILY Patient taking differently: Take 10 mEq by mouth daily. 11/05/21   Newman Niparroll, Donna C, NP  ROCKLATAN 0.02-0.005 % SOLN Place 1 drop into both eyes at bedtime. 01/15/21   [provider]  UNABLE TO FIND BIPAP with 2lpm o2 at night  [facility]    [provider]  vitamin B-12 (CYANOCOBALAMIN) 1000 MCG tablet Take 1,000 mcg by mouth every other day.    [provider]     Critical care time: 25 min    Brett CanalesSteve Shaena Parkerson ACNP Acute Care Nurse Practitioner [facility] Please consult Amion 11/29/2022, 10:14 AM

## 2022-11-30 DIAGNOSIS — J189 Pneumonia, unspecified organism: Secondary | ICD-10-CM | POA: Diagnosis not present

## 2022-11-30 DIAGNOSIS — J9621 Acute and chronic respiratory failure with hypoxia: Secondary | ICD-10-CM | POA: Diagnosis not present

## 2022-11-30 LAB — PROCALCITONIN: Procalcitonin: <0.1 ng/mL

## 2022-11-30 LAB — BASIC METABOLIC PANEL
Anion gap: 13 (ref 5–15)
BUN: 32 mg/dL — ABNORMAL HIGH (ref 8–23)
CO2: 24 mmol/L (ref 22–32)
Calcium: 9.4 mg/dL (ref 8.9–10.3)
Chloride: 104 mmol/L (ref 98–111)
Creatinine, Ser: 0.95 mg/dL (ref 0.44–1.00)
GFR, Estimated: >60 mL/min (ref >60)
Glucose, Bld: 149 mg/dL — ABNORMAL HIGH (ref 70–99)
Potassium: 3.9 mmol/L (ref 3.5–5.1)
Sodium: 141 mmol/L (ref 135–145)

## 2022-11-30 LAB — RESPIRATORY PANEL BY PCR

## 2022-11-30 LAB — URINE CULTURE: Culture: >=100000 — AB

## 2022-11-30 LAB — CBC
HCT: 41.5 % (ref 36.0–46.0)
Hemoglobin: 13.3 g/dL (ref 12.0–15.0)
MCH: 29.4 pg (ref 26.0–34.0)
MCHC: 32 g/dL (ref 30.0–36.0)
MCV: 91.6 fL (ref 80.0–100.0)
Platelets: 211 10*3/uL (ref 150–400)
RBC: 4.53 MIL/uL (ref 3.87–5.11)
RDW: 13.8 % (ref 11.5–15.5)
WBC: 19 10*3/uL — ABNORMAL HIGH (ref 4.0–10.5)
nRBC: 0 % (ref 0.0–0.2)

## 2022-11-30 LAB — BRAIN NATRIURETIC PEPTIDE: B Natriuretic Peptide: 67.5 pg/mL (ref 0.0–100.0)

## 2022-11-30 MED ORDER — FUROSEMIDE 10 MG/ML IJ SOLN
40.0000 mg | Freq: Every day | INTRAMUSCULAR | Status: DC
  Administered 2022-11-30 – 2022-12-01 (×2): 40 mg via INTRAVENOUS
  Filled 2022-11-30 (×2): qty 4

## 2022-11-30 MED ORDER — POTASSIUM CHLORIDE CRYS ER 20 MEQ PO TBCR
40.0000 meq | EXTENDED_RELEASE_TABLET | Freq: Once | ORAL | Status: AC
  Administered 2022-11-30: 40 meq via ORAL
  Filled 2022-11-30: qty 2

## 2022-11-30 NOTE — Progress Notes (Signed)
   NAME:  [name], MRN:  [id number], DOB:  [birth year], LOS: 2 ADMISSION DATE:  11/28/2022, CONSULTATION DATE: 11/29/2022 REFERRING MD: [facility], CHIEF COMPLAINT: Acute on chronic hypoxic respiratory with suspected pneumonia  History of Present Illness:  72 year old female who is chronically O2 dependent at 2 to 3 L nasal cannula daily basis.  She notes fever purulent sputum with blood-tinged dyspnea on exertion and increasing O2 needs while at home.  CT scan is negative for PE but does show nonspecific pulmonary fibrosis, groundglass opacity.  Pulmonary critical care asked to evaluate.  She is followed by Dr. Sandrea Hughs of pulmonary.  Pertinent  Medical History   Past Medical History:  Diagnosis Date   Chronic diastolic CHF (congestive heart failure)    Chronic respiratory failure    Coronary atherosclerosis    a. 2V by CT 10/2017.   Emphysema of lung    Hypercholesterolemia    Hypertension    NSIP (nonspecific interstitial pneumonia)    Osteoarthritis    Persistent atrial fibrillation    Thoracic aortic ectasia      Significant Hospital Events: Including procedures, antibiotic start and stop dates in addition to other pertinent events     Interim History / Subjective:  Pulmonary critical care called for increasing oxygen  Objective   Blood pressure 128/71, pulse 69, temperature 98.4 F (36.9 C), temperature source Oral, resp. rate 20, height 5' (1.524 m), weight 88.8 kg, SpO2 94 %.        Intake/Output Summary (Last 24 hours) at 11/30/2022 1307 Last data filed at 11/30/2022 1258 Gross per 24 hour  Intake 860.24 ml  Output 2250 ml  Net -1389.76 ml   Filed Weights   11/28/22 1420 11/29/22 0420 11/30/22 0500  Weight: 88.9 kg 88.7 kg 88.8 kg    Examination: General laying in bed. Still w/ pretty sig WOB speaking in 2-3 word phrases HENT NCAT no JVD  Pulm dec'd t/o + accessory use currently on 15 lpm [place]. Sats 86-88% Card rrr Abd soft  Ext warm and dry  Neuro  intact   Resolved Hospital Problem list     Assessment & Plan:  Acute on chronic hypoxic respiratory failure 2/2 CAP vs ILD flair (h/o NSIP)  c/b pulmonary edema  H/o obstructive sleep apnea   (f/b wert, normally on 2-3 liters) RVP neg, PCT reassuring Plan Cont current abx; day 3 of doxy and ctx Cont systemic steroid pulse at 40mg  q 12 for now  Cont supplemental oxygen for sats > 88%-->will place her on heated high flow Agree w/ IV diuresis. F/u on ECHO results.  Mobilize Add IS and flutter  BIPAP at HS   Leukocytosis Plan Monitor, could be exacerbated by systemic steroids   Chronic atrial fibrillation treated with Tikosyn and Eliquis Plan Cardiac monitoring Stopped Eliquis in the setting of hemoptysis when hemoptysis clears resume  Cont cardiazem  Cont tikosyn  Diabetes mellitus Plan Monitor per primary   Hypertension Plan Cont avapro Daily assessment for diuretics   Anxiety Plan Anxiolytics as needed  Glaucoma Plan Per primary     Best Practice (right click and "Reselect all SmartList Selections" daily)   Diet/type: Regular consistency (see orders) DVT prophylaxis: DOAC GI prophylaxis: PPI Lines: N/A Foley:  N/A Code Status:  full code Last date of multidisciplinary goals of care discussion tbd    Simonne Martinet ACNP-BC [facility] Pulmonary/Critical Care Pager # [phone] OR # [phone] if no answer

## 2022-11-30 NOTE — Evaluation (Signed)
Physical Therapy Evaluation Patient Details Name: [name] MRN: [id number] DOB: 09-21-1950 Today's Date: 11/30/2022  History of Present Illness  The pt is a 72 yo female presenting 4/4 with hemoptysis and hypoxia to 51% on 2L O2. Pt found to have PNA with pulmonary fibrosis. Started HHFNC 4/6. PMH includes: CHF, emphysema, HTN, OA, afib, and chronic pain.  Clinical Impression  Pt admitted with above diagnosis. On HHFNC @ 25L/min 81%O2.  Her SpO2 averaged approx 88% throughout session and increased to 93% while seated in recliner at end of session. Previously independent at baseline, required supervision for OOB mobility today. Reviewed LE exercises. Tolerated treatment well. Will plan to ambulate next visit if she remains well. Pt currently with functional limitations due to the deficits listed below (see PT Problem List). Pt will benefit from acute skilled PT to increase their independence and safety with mobility to allow discharge.          Recommendations for follow up therapy are one component of a multi-disciplinary discharge planning process, led by the attending physician.  Recommendations may be updated based on patient status, additional functional criteria and insurance authorization.     Assistance Recommended at Discharge Intermittent Supervision/Assistance  Patient can return home with the following  A little help with walking and/or transfers;Assistance with cooking/housework;Assist for transportation;Help with stairs or ramp for entrance    Equipment Recommendations Other (comment) (TBD)     Functional Status Assessment Patient has had a recent decline in their functional status and demonstrates the ability to make significant improvements in function in a reasonable and predictable amount of time.     Precautions / Restrictions Precautions Precautions: None Precaution Comments: Monitor O2 Restrictions Weight Bearing Restrictions: No      Mobility  Bed  Mobility Overal bed mobility: Modified Independent             General bed mobility comments: No assist needed, managed lines/leads only.    Transfers Overall transfer level: Needs assistance Equipment used: Rolling walker (2 wheels) Transfers: Sit to/from Stand, Bed to chair/wheelchair/BSC Sit to Stand: Supervision   Step pivot transfers: Supervision       General transfer comment: Supervision for safety, cues for technique and hand placement. Light use of RW for support. Step pivot without buckling. Sats to 85% briefly but rebounded to 90% quickly.    Ambulation/Gait                  Stairs            Wheelchair Mobility    Modified Rankin (Stroke Patients Only)       Balance Overall balance assessment: Mild deficits observed, not formally tested                                           Pertinent Vitals/Pain Pain Assessment Pain Assessment: No/denies pain    Home Living Family/patient expects to be discharged to:: Private residence Living Arrangements: Alone Available Help at Discharge: Family;Available PRN/intermittently Type of Home: House Home Access: Level entry       Home Layout: One level Home Equipment: None      Prior Function Prior Level of Function : Independent/Modified Independent             Mobility Comments: ind ADLs Comments: ind     Hand Dominance   Dominant Hand: Right    Extremity/Trunk  Assessment   Upper Extremity Assessment Upper Extremity Assessment: Defer to OT evaluation    Lower Extremity Assessment Lower Extremity Assessment: Overall WFL for tasks assessed       Communication   Communication: No difficulties  Cognition Arousal/Alertness: Awake/alert Behavior During Therapy: WFL for tasks assessed/performed Overall Cognitive Status: Within Functional Limits for tasks assessed                                          General Comments General comments  (skin integrity, edema, etc.): HHFNC - SpO2 ranged  from  85-92% @ 25L/min 81%O2. HR in 80s throughout, RR up to 30s at times. Couging intermittently.    Exercises General Exercises - Lower Extremity Ankle Circles/Pumps: AROM, Both, 10 reps, Supine Quad Sets: Strengthening, Both, 10 reps, Seated Gluteal Sets: Strengthening, Both, 10 reps, Seated Other Exercises Other Exercises: Stationary march   Assessment/Plan    PT Assessment Patient needs continued PT services  PT Problem List Decreased strength;Decreased activity tolerance;Decreased balance;Decreased mobility;Decreased knowledge of use of DME;Cardiopulmonary status limiting activity;Obesity       PT Treatment Interventions DME instruction;Gait training;Stair training;Functional mobility training;Therapeutic activities;Therapeutic exercise;Balance training;Neuromuscular re-education;Patient/family education    PT Goals (Current goals can be found in the Care Plan section)  Acute Rehab PT Goals Patient Stated Goal: Get well PT Goal Formulation: With patient Time For Goal Achievement: 12/14/22 Potential to Achieve Goals: Good    Frequency Min 3X/week     Co-evaluation               AM-PAC PT "6 Clicks" Mobility  Outcome Measure Help needed turning from your back to your side while in a flat bed without using bedrails?: None Help needed moving from lying on your back to sitting on the side of a flat bed without using bedrails?: None Help needed moving to and from a bed to a chair (including a wheelchair)?: A Little Help needed standing up from a chair using your arms (e.g., wheelchair or bedside chair)?: A Little Help needed to walk in hospital room?: A Little Help needed climbing 3-5 steps with a railing? : A Little 6 Click Score: 20    End of Session Equipment Utilized During Treatment: Oxygen (HHFNC) Activity Tolerance: Patient tolerated treatment well Patient left: in chair;with call bell/phone within  reach;with chair alarm set Nurse Communication: Mobility status PT Visit Diagnosis: Unsteadiness on feet (R26.81);Difficulty in walking, not elsewhere classified (R26.2)    Time: 2956-2130 PT Time Calculation (min) (ACUTE ONLY): 35 min   Charges:   PT Evaluation $PT Eval Low Complexity: 1 Low PT Treatments $Therapeutic Activity: 8-22 mins        Kathlyn Sacramento, PT, DPT Physical Therapist Acute Rehabilitation Services [facility] Outpatient Rehabilitation Services [facility]   Berton Mount 11/30/2022, 4:27 PM

## 2022-11-30 NOTE — Progress Notes (Signed)
Called by tele that pt was sating in the low 80's. Upon entering room, pt 02 sat was 86%. Pt has been fluctuating from 88-92% on 15L high flow this am. RT came to the bedside and placed non-rebreather over [place]. Non-rebreather was left on for approximately 10 minutes and then removed by this RN. 02 sat upon removal was 95%. Will continue to monitor.   Robina Ade, RN

## 2022-11-30 NOTE — Progress Notes (Signed)
PROGRESS NOTE    [name]  ZOX:[id number] DOB: January 26, 1951 DOA: 11/28/2022 PCP: [name], MD  Chief Complaint  Patient presents with   Shortness of Breath    Brief Narrative:   [name] is [name] pleasant 72 y.o. female with medical history significant for atrial fibrillation on Eliquis, pulmonary fibrosis with chronic hypoxic respiratory failure, hypertension, anxiety, and chronic pain who presents to the emergency department with cough, shortness of breath, and fevers.   She's being treated for pneumonia.  Pulmonology c/s.    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 Sepsis due to Community Acquired Pneumonia  acute on chronic hypoxic respiratory failure pulmonary fibrosis  - on 2-4 L at baseline - currently 15 L [place] satting in low 90's, mildly tachypneic  - meets sepsis criteria with fever, tachypnea, tachycardia, pna  - CT chest with diffuse patchy GGO superimposed on [name] background of mild pulm fibrosis - negative covid, flu, RSV - follow RVP, sputum cx, MRSA, urine strep, urine legionella - continue steroids and antibiotics (procal is <0.1, but given severity of hypoxia with fever will continue course of abx for pna) - diurese as tolerated, follow echo - will consult pulmonology - wean O2 as tolerated    2. OSA  - Continue BiPAP qHS     3. Atrial fibrillation  - Continue Eliquis, Tikosyn, and diltiazem     4. Hypertension  - Continue diltiazem and irbesartan     5. Chronic pain; anxiety  - Prescription database reviewed, plan to continue Norco and Ativan as at home     6. Small Volume Hemoptysis  - Scant, no central PE on  CTA chest  - Likely d/t infection, continue antibiotics as above, cautiously continue Eliquis    7. Thoracic aorta ectasia  - Noted on CTA in ED  - Continue CT surgery follow-up as planned     DVT prophylaxis: eliquis Code Status: full Family Communication: none Disposition:   Status is: Inpatient Remains inpatient appropriate because: continued hypoxia    Consultants:  pulmonology  Procedures:  none  Antimicrobials:  Anti-infectives (From admission, onward)    Start     Dose/Rate Route Frequency Ordered Stop   11/29/22 1800  vancomycin (VANCOREADY) IVPB 750 mg/150 mL  Status:  Discontinued        750 mg 150 mL/hr over 60 Minutes Intravenous Every 24 hours 11/28/22 1844 11/28/22 2031   11/29/22 0600  ceFEPIme (MAXIPIME) 2 g in sodium chloride 0.9 % 100 mL IVPB  Status:  Discontinued        2 g 200 mL/hr over 30 Minutes Intravenous Every 12 hours 11/28/22 1844 11/28/22 2031   11/28/22 2300  cefTRIAXone (ROCEPHIN) 2 g in sodium chloride 0.9 % 100 mL IVPB        2 g 200 mL/hr over 30 Minutes Intravenous Every 24 hours 11/28/22 2031 12/03/22 2259   11/28/22 2200  doxycycline (VIBRAMYCIN) 100 mg in sodium chloride 0.9 % 250 mL IVPB        100 mg 125 mL/hr over 120 Minutes Intravenous Every 12 hours 11/28/22 2048     11/28/22 2045  azithromycin (  ZITHROMAX) 500 mg in sodium chloride 0.9 % 250 mL IVPB  Status:  Discontinued        500 mg 250 mL/hr over 60 Minutes Intravenous Every 24 hours 11/28/22 2031 11/28/22 2048   11/28/22 1600  ceFEPIme (MAXIPIME) 2 g in sodium chloride 0.9 % 100 mL IVPB        2 g 200 mL/hr over 30 Minutes Intravenous  Once 11/28/22 1547 11/28/22 1806   11/28/22 1600  metroNIDAZOLE (FLAGYL) IVPB 500 mg        500 mg 100 mL/hr over 60 Minutes Intravenous  Once 11/28/22 1547 11/28/22 1723   11/28/22 1600  vancomycin (VANCOCIN) IVPB 1000 mg/200 mL premix  Status:  Discontinued        1,000 mg 200 mL/hr over 60 Minutes Intravenous  Once 11/28/22 1547  11/28/22 1550   11/28/22 1600  vancomycin (VANCOREADY) IVPB 1750 mg/350 mL        1,750 mg 175 mL/hr over 120 Minutes Intravenous  Once 11/28/22 1550 11/28/22 2212       Subjective: No complaints Watching "moonshiners" Felt better after lasix yesterday  Objective: Vitals:   11/29/22 2320 11/30/22 0316 11/30/22 0500 11/30/22 0700  BP: (!) 144/83 121/69  (!) 141/71  Pulse: 67 64  75  Resp: 20 20    Temp: 97.7 F (36.5 C) 97.7 F (36.5 C)  98 F (36.7 C)  TempSrc: Axillary Axillary  Oral  SpO2: 93% 94%    Weight:   88.8 kg   Height:        Intake/Output Summary (Last 24 hours) at 11/30/2022 0957 Last data filed at 11/30/2022 0302 Gross per 24 hour  Intake 620.24 ml  Output 1400 ml  Net -779.76 ml   Filed Weights   11/28/22 1420 11/29/22 0420 11/30/22 0500  Weight: 88.9 kg 88.7 kg 88.8 kg    Examination:  General: No acute distress. Cardiovascular: RRR Lungs: bibasilar crackles Abdomen: Soft, nontender, nondistended  Neurological: Alert and oriented 3. Moves all extremities 4 with equal strength. Cranial nerves II through XII grossly intact. Extremities: No clubbing or cyanosis. No edema.    Data Reviewed: I have personally reviewed following labs and imaging studies  CBC: Recent Labs  Lab 11/28/22 1431 11/29/22 0053 11/30/22 0649  WBC 10.6* 11.3* 19.0*  NEUTROABS 8.4*  --   --   HGB 14.2 12.7 13.3  HCT 43.3 38.1 41.5  MCV 90.2 90.1 91.6  PLT 208 189 211    Basic Metabolic Panel: Recent Labs  Lab 11/28/22 1431 11/29/22 0053 11/30/22 0649  NA 142 140 141  K 3.7 3.9 3.9  CL 102 107 104  CO2 27 24 24   GLUCOSE 128* 196* 149*  BUN 15 17 32*  CREATININE 0.93 0.80 0.95  CALCIUM 10.0 9.0 9.4  MG  --  2.3  --     GFR: Estimated Creatinine Clearance: 53.1 mL/min (by C-G formula based on SCr of 0.95 mg/dL).  Liver Function Tests: Recent Labs  Lab 11/28/22 1431  AST 21  ALT 15  ALKPHOS 100  BILITOT 0.8  PROT 8.1  ALBUMIN 3.5    CBG: No  results for input(s): "GLUCAP" in the last 168 hours.   Recent Results (from the past 240 hour(s))  Urine Culture     Status: Abnormal (Preliminary result)   Collection Time: 11/28/22  2:31 PM   Specimen: Urine, Clean Catch  Result Value Ref Range Status   Specimen Description URINE, CLEAN CATCH  Final  Special Requests NONE Reflexed from [id number]  Final   Culture (Alantra Pop[place])  Final    >=100,000 COLONIES/mL GRAM NEGATIVE RODS IDENTIFICATION AND SUSCEPTIBILITIES TO FOLLOW Performed at [facility], [address] [place], [place] [address]    Report Status PENDING  Incomplete  Resp panel by RT-PCR (RSV, Flu Anaise Sterbenz&B, Covid)     Status: None   Collection Time: 11/28/22  2:32 PM   Specimen: Nasal Swab  Result Value Ref Range Status   SARS Coronavirus 2 by RT PCR NEGATIVE NEGATIVE Final   Influenza Jaishaun Mcnab by PCR NEGATIVE NEGATIVE Final   Influenza B by PCR NEGATIVE NEGATIVE Final    Comment: (NOTE) The Xpert Xpress SARS-CoV-2/FLU/RSV plus assay is intended as an aid in the diagnosis of influenza from Nasopharyngeal swab specimens and should not be used as Ashtyn Meland sole basis for treatment. Nasal washings and aspirates are unacceptable for Xpert Xpress SARS-CoV-2/FLU/RSV testing.  Fact Sheet for Patients: BloggerCourse.com  Fact Sheet for Healthcare Providers: SeriousBroker.it  This test is not yet approved or cleared by the [place] FDA and has been authorized for detection and/or diagnosis of SARS-CoV-2 by FDA under an Emergency Use Authorization (EUA). This EUA will remain in effect (meaning this test can be used) for the duration of the COVID-19 declaration under Section 564(b)(1) of the Act, 21 U.S.C. section 360bbb-3(b)(1), unless the authorization is terminated or revoked.     Resp Syncytial Virus by PCR NEGATIVE NEGATIVE Final    Comment: (NOTE) Fact Sheet for Patients: BloggerCourse.com  Fact Sheet for  Healthcare Providers: SeriousBroker.it  This test is not yet approved or cleared by the [place] FDA and has been authorized for detection and/or diagnosis of SARS-CoV-2 by FDA under an Emergency Use Authorization (EUA). This EUA will remain in effect (meaning this test can be used) for the duration of the COVID-19 declaration under Section 564(b)(1) of the Act, 21 U.S.C. section 360bbb-3(b)(1), unless the authorization is terminated or revoked.  Performed at [facility], [address] [place], [place] [address]   Blood Culture (routine x 2)     Status: None (Preliminary result)   Collection Time: 11/28/22  2:36 PM   Specimen: BLOOD  Result Value Ref Range Status   Specimen Description BLOOD RIGHT ANTECUBITAL  Final   Special Requests   Final    BOTTLES DRAWN AEROBIC AND ANAEROBIC Blood Culture results may not be optimal due to an excessive volume of blood received in culture bottles   Culture   Final    NO GROWTH < 24 HOURS Performed at [facility], [address], [place], [place] [address]    Report Status PENDING  Incomplete  Blood Culture (routine x 2)     Status: None (Preliminary result)   Collection Time: 11/28/22  3:28 PM   Specimen: BLOOD  Result Value Ref Range Status   Specimen Description BLOOD LEFT ANTECUBITAL  Final   Special Requests   Final    BOTTLES DRAWN AEROBIC AND ANAEROBIC Blood Culture results may not be optimal due to an inadequate volume of blood received in culture bottles   Culture   Final    NO GROWTH < 24 HOURS Performed at [facility], [address] [place], [place] [address]    Report Status PENDING  Incomplete         Radiology Studies: CT Angio Chest PE W and/or Wo Contrast  Result Date: 11/28/2022 CLINICAL DATA:  Shortness of breath and headache.  Hemoptysis today. EXAM: CT ANGIOGRAPHY CHEST WITH CONTRAST TECHNIQUE: Multidetector CT imaging of the chest was performed using the  standard protocol during bolus administration of intravenous contrast. Multiplanar CT image reconstructions and MIPs were obtained to evaluate the vascular anatomy. RADIATION DOSE REDUCTION: This exam was performed according to the departmental dose-optimization program which includes automated exposure control, adjustment of the mA and/or kV according to patient size and/or use of iterative reconstruction technique. CONTRAST:  80mL OMNIPAQUE IOHEXOL 350 MG/ML SOLN COMPARISON:  Radiographs earlier today and CTA chest 07/16/2022 FINDINGS: Cardiovascular: Satisfactory opacification of the central pulmonary arteries. The segmental and subsegmental lower lobe pulmonary arteries are not well evaluated due to motion. No evidence of central pulmonary embolism. Normal heart size. No pericardial effusion. Coronary artery and aortic atherosclerotic calcification. 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 Mediastinum/Nodes: No enlarged mediastinal, hilar, or axillary lymph nodes. Thyroid gland, trachea, and esophagus demonstrate no significant findings. Lungs/Pleura: Low lung volumes. Diffuse patchy ground-glass opacities and interlobular septal thickening are new since 07/16/2022. This is superimposed on Lachina Salsberry background of mild pulmonary fibrosis with Hero Kulish apical to basal gradient and peripheral reticular opacities. No pleural effusion or pneumothorax. Right upper lobe bronchiolectasis. Upper Abdomen: No acute abnormality. Musculoskeletal: No chest wall abnormality. No acute osseous findings. Review of the MIP images confirms the above findings. IMPRESSION: 1. No evidence of central pulmonary embolism. The segmental and subsegmental lower lobe pulmonary arteries are  not well evaluated due to motion. 2. Diffuse patchy ground-glass opacities superimposed on Helmuth Recupero background of mild pulmonary fibrosis. Findings are favored to represent atypical infection. 3. 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 Recommend semi-annual imaging followup by CTA or MRA and referral to cardiothoracic surgery if not already obtained. This recommendation follows 2010 ACCF/AHA/AATS/ACR/ASA/SCA/SCAI/SIR/STS/SVM Guidelines for the Diagnosis and Management of Patients With Thoracic Aortic Disease. Circulation. 2010; 121: Z610-R604. Aortic aneurysm NOS (ICD10-I71.9) 4. 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 Aortic Atherosclerosis (ICD10-I70.0). Electronically Signed   By: Minerva Fester M.D.   On: 11/28/2022 19:52   DG Chest Port 1 View  Result Date: 11/28/2022 CLINICAL DATA:  Question sepsis. EXAM: PORTABLE CHEST 1 VIEW COMPARISON:  Chest CT 07/16/2022 FINDINGS: Stable enlarged cardiac silhouette ectatic aorta. There is bilateral interstitial prominence. Low lung volumes. No pneumothorax. No focal consolidation IMPRESSION: Cardiomegaly and interstitial prominence suggest interstitial edema. Electronically Signed   By: Genevive Bi M.D.   On: 11/28/2022 14:59        Scheduled Meds:  atorvastatin  40 mg Oral Daily   diltiazem  240 mg Oral Daily   dofetilide  500 mcg Oral BID   furosemide  40 mg Intravenous Daily   irbesartan  300 mg Oral Daily   methylPREDNISolone (SOLU-MEDROL) injection  40 mg Intravenous Q12H   sodium chloride flush  3 mL Intravenous Q12H   Continuous Infusions:  cefTRIAXone (ROCEPHIN)  IV 2 g (11/29/22 2328)   doxycycline (VIBRAMYCIN) IV 100 mg (11/30/22 0857)     LOS: 2 days    Time spent: over 30 min - 40 min critical care time with ahrf, worsening  needing 15 L HFNC  Lacretia Nicksaldwell Powell, MD [facility]   To contact the attending provider between 7A-7P or the covering provider during after hours 7P-7A, please log into the web site [email or web address] and access using universal [place] password for that web site. If you do not have the password, please call the hospital operator.  11/30/2022, 9:57 AM

## 2022-12-01 ENCOUNTER — Inpatient Hospital Stay (HOSPITAL_COMMUNITY): Payer: Medicare Other

## 2022-12-01 DIAGNOSIS — J9621 Acute and chronic respiratory failure with hypoxia: Secondary | ICD-10-CM | POA: Diagnosis not present

## 2022-12-01 DIAGNOSIS — R0609 Other forms of dyspnea: Secondary | ICD-10-CM

## 2022-12-01 DIAGNOSIS — J189 Pneumonia, unspecified organism: Secondary | ICD-10-CM | POA: Diagnosis not present

## 2022-12-01 LAB — CBC
HCT: 41.3 % (ref 36.0–46.0)
Hemoglobin: 13.6 g/dL (ref 12.0–15.0)
MCH: 30 pg (ref 26.0–34.0)
MCHC: 32.9 g/dL (ref 30.0–36.0)
MCV: 91 fL (ref 80.0–100.0)
Platelets: 234 10*3/uL (ref 150–400)
RBC: 4.54 MIL/uL (ref 3.87–5.11)
RDW: 14.1 % (ref 11.5–15.5)
WBC: 17.2 10*3/uL — ABNORMAL HIGH (ref 4.0–10.5)
nRBC: 0 % (ref 0.0–0.2)

## 2022-12-01 LAB — BASIC METABOLIC PANEL
Anion gap: 8 (ref 5–15)
BUN: 41 mg/dL — ABNORMAL HIGH (ref 8–23)
CO2: 25 mmol/L (ref 22–32)
Calcium: 9 mg/dL (ref 8.9–10.3)
Chloride: 105 mmol/L (ref 98–111)
Creatinine, Ser: 1.13 mg/dL — ABNORMAL HIGH (ref 0.44–1.00)
GFR, Estimated: 52 mL/min — ABNORMAL LOW (ref >60)
Glucose, Bld: 227 mg/dL — ABNORMAL HIGH (ref 70–99)
Potassium: 4.3 mmol/L (ref 3.5–5.1)
Sodium: 138 mmol/L (ref 135–145)

## 2022-12-01 LAB — ECHOCARDIOGRAM COMPLETE
Area-P 1/2: 2.75 cm2
Height: 60 in
Weight: 3132.3 oz

## 2022-12-01 LAB — HEMOGLOBIN A1C
Hgb A1c MFr Bld: 6.6 % — ABNORMAL HIGH (ref 4.8–5.6)
Mean Plasma Glucose: 142.72 mg/dL

## 2022-12-01 LAB — GLUCOSE, CAPILLARY
Glucose-Capillary: 213 mg/dL — ABNORMAL HIGH (ref 70–99)
Glucose-Capillary: 191 mg/dL — ABNORMAL HIGH (ref 70–99)

## 2022-12-01 MED ORDER — ACETAMINOPHEN 325 MG PO TABS
325.0000 mg | ORAL_TABLET | ORAL | Status: DC | PRN
  Administered 2022-12-04 – 2022-12-07 (×3): 325 mg via ORAL
  Filled 2022-12-01 (×3): qty 1

## 2022-12-01 MED ORDER — INSULIN ASPART 100 UNIT/ML IJ SOLN
0.0000 [IU] | Freq: Every day | INTRAMUSCULAR | Status: DC
  Administered 2022-12-02 – 2022-12-05 (×2): 2 [IU] via SUBCUTANEOUS
  Administered 2022-12-08: 3 [IU] via SUBCUTANEOUS

## 2022-12-01 MED ORDER — BRIMONIDINE TARTRATE 0.2 % OP SOLN
1.0000 [drp] | Freq: Two times a day (BID) | OPHTHALMIC | Status: DC
  Administered 2022-12-01 – 2022-12-12 (×22): 1 [drp] via OPHTHALMIC
  Filled 2022-12-01: qty 5

## 2022-12-01 MED ORDER — INSULIN ASPART 100 UNIT/ML IJ SOLN
0.0000 [IU] | Freq: Three times a day (TID) | INTRAMUSCULAR | Status: DC
  Administered 2022-12-01 – 2022-12-02 (×3): 5 [IU] via SUBCUTANEOUS
  Administered 2022-12-03: 3 [IU] via SUBCUTANEOUS
  Administered 2022-12-03: 2 [IU] via SUBCUTANEOUS
  Administered 2022-12-03: 3 [IU] via SUBCUTANEOUS
  Administered 2022-12-04: 5 [IU] via SUBCUTANEOUS
  Administered 2022-12-04: 3 [IU] via SUBCUTANEOUS
  Administered 2022-12-05: 11 [IU] via SUBCUTANEOUS
  Administered 2022-12-05 – 2022-12-06 (×2): 3 [IU] via SUBCUTANEOUS
  Administered 2022-12-07: 2 [IU] via SUBCUTANEOUS
  Administered 2022-12-07 (×2): 5 [IU] via SUBCUTANEOUS
  Administered 2022-12-08: 8 [IU] via SUBCUTANEOUS
  Administered 2022-12-09: 2 [IU] via SUBCUTANEOUS
  Administered 2022-12-09: 8 [IU] via SUBCUTANEOUS
  Administered 2022-12-10: 15 [IU] via SUBCUTANEOUS

## 2022-12-01 MED ORDER — NETARSUDIL-LATANOPROST 0.02-0.005 % OP SOLN
1.0000 [drp] | Freq: Every day | OPHTHALMIC | Status: DC
  Administered 2022-12-02: 1 [drp] via OPHTHALMIC

## 2022-12-01 MED ORDER — FUROSEMIDE 10 MG/ML IJ SOLN
40.0000 mg | Freq: Two times a day (BID) | INTRAMUSCULAR | Status: DC
  Administered 2022-12-01 – 2022-12-05 (×8): 40 mg via INTRAVENOUS
  Filled 2022-12-01 (×8): qty 4

## 2022-12-01 MED ORDER — ZOLPIDEM TARTRATE 5 MG PO TABS
5.0000 mg | ORAL_TABLET | Freq: Every evening | ORAL | Status: DC | PRN
  Administered 2022-12-01 – 2022-12-11 (×11): 5 mg via ORAL
  Filled 2022-12-01 (×11): qty 1

## 2022-12-01 MED ORDER — PERFLUTREN LIPID MICROSPHERE
1.0000 mL | INTRAVENOUS | Status: AC | PRN
  Administered 2022-12-01: 4 mL via INTRAVENOUS
  Filled 2022-12-01: qty 10

## 2022-12-01 NOTE — Progress Notes (Signed)
PROGRESS NOTE    [name]  ZOX:[id number] DOB: 1950-09-01 DOA: 11/28/2022 PCP: [name], MD  Chief Complaint  Patient presents with   Shortness of Breath    Brief Narrative:   [name] is [name] pleasant 72 y.o. female with medical history significant for atrial fibrillation on Eliquis, pulmonary fibrosis with chronic hypoxic respiratory failure, hypertension, anxiety, and chronic pain who presents to the emergency department with cough, shortness of breath, and fevers.   She's being treated for pneumonia.  Pulmonology c/s.    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 Sepsis due to Community Acquired Pneumonia  acute on chronic hypoxic respiratory failure pulmonary fibrosis  - on 2-4 L at baseline - currently 15 L [place] satting in low 90's, mildly tachypneic  - meets sepsis criteria with fever, tachypnea, tachycardia, pna  - CT chest with diffuse patchy GGO superimposed on Kenlea Woodell background of mild pulm fibrosis - negative covid, flu, RSV - follow RVP - negative, sputum cx, MRSA, urine strep (negative), urine legionella (pending) - continue steroids and antibiotics (procal is <0.1, but given severity of hypoxia with fever will continue course of abx for pna) - diurese as tolerated, follow echo (EF 60-65%, no RWMA, grade 1 diastolic dysfunction) - increased to q12 per pulm  - appreciate pulm recs - wean O2 as tolerated -> currently on 30 L HHFNC at 80% FiO2, goal sat 88%   # Asymptomatic Bacteruria  - proteus on urine culture, no complaints of sx - ceftriaxone should be adequate  # T2DM A1c 6.6 Ssi, follow   2. OSA  - Continue BiPAP qHS      3. Atrial fibrillation  - Continue Eliquis (on hold with hemoptysis), Tikosyn, and diltiazem     4. Hypertension  - Continue diltiazem and irbesartan     5. Chronic pain; anxiety  - Prescription database reviewed, plan to continue Norco and Ativan as at home     6. Small Volume Hemoptysis  - Scant, no central PE on CTA chest  - Likely d/t infection, continue antibiotics as above, eliquis on hold   7. Thoracic aorta ectasia  - Noted on CTA in ED  - Continue CT surgery follow-up as planned   Glaucoma - eye drops     DVT prophylaxis: eliquis on hold, SCD Code Status: full Family Communication: none Disposition:   Status is: Inpatient Remains inpatient appropriate because: continued hypoxia    Consultants:  pulmonology  Procedures:  Echo IMPRESSIONS     1. Left ventricular ejection fraction, by estimation, is 60 to 65%. The  left ventricle has normal function. The left ventricle has no regional  wall motion abnormalities. Left ventricular diastolic parameters are  consistent with Grade I diastolic  dysfunction (impaired relaxation). Elevated left atrial pressure.   2. Right ventricular systolic function was not well visualized. The right  ventricular size is not well visualized. Tricuspid regurgitation signal is  inadequate for assessing PA pressure.   3. The mitral valve was not well visualized. No evidence of mitral valve  regurgitation. No evidence of mitral stenosis.   4. The aortic valve was not well visualized. There  is mild calcification  of the aortic valve. Aortic valve regurgitation is not visualized. Aortic  valve sclerosis/calcification is present, without any evidence of aortic  stenosis.   5. The inferior vena cava is normal in size with greater than 50%  respiratory variability, suggesting right atrial pressure of 3 mmHg.   Comparison(s): No significant change from prior study. Prior images  reviewed side by side.   Antimicrobials:   Anti-infectives (From admission, onward)    Start     Dose/Rate Route Frequency Ordered Stop   11/29/22 1800  vancomycin (VANCOREADY) IVPB 750 mg/150 mL  Status:  Discontinued        750 mg 150 mL/hr over 60 Minutes Intravenous Every 24 hours 11/28/22 1844 11/28/22 2031   11/29/22 0600  ceFEPIme (MAXIPIME) 2 g in sodium chloride 0.9 % 100 mL IVPB  Status:  Discontinued        2 g 200 mL/hr over 30 Minutes Intravenous Every 12 hours 11/28/22 1844 11/28/22 2031   11/28/22 2300  cefTRIAXone (ROCEPHIN) 2 g in sodium chloride 0.9 % 100 mL IVPB        2 g 200 mL/hr over 30 Minutes Intravenous Every 24 hours 11/28/22 2031 12/03/22 2259   11/28/22 2200  doxycycline (VIBRAMYCIN) 100 mg in sodium chloride 0.9 % 250 mL IVPB        100 mg 125 mL/hr over 120 Minutes Intravenous Every 12 hours 11/28/22 2048     11/28/22 2045  azithromycin (ZITHROMAX) 500 mg in sodium chloride 0.9 % 250 mL IVPB  Status:  Discontinued        500 mg 250 mL/hr over 60 Minutes Intravenous Every 24 hours 11/28/22 2031 11/28/22 2048   11/28/22 1600  ceFEPIme (MAXIPIME) 2 g in sodium chloride 0.9 % 100 mL IVPB        2 g 200 mL/hr over 30 Minutes Intravenous  Once 11/28/22 1547 11/28/22 1806   11/28/22 1600  metroNIDAZOLE (FLAGYL) IVPB 500 mg        500 mg 100 mL/hr over 60 Minutes Intravenous  Once 11/28/22 1547 11/28/22 1723   11/28/22 1600  vancomycin (VANCOCIN) IVPB 1000 mg/200 mL premix  Status:  Discontinued        1,000 mg 200 mL/hr over 60 Minutes Intravenous  Once 11/28/22 1547 11/28/22 1550   11/28/22 1600  vancomycin (VANCOREADY) IVPB 1750 mg/350 mL        1,750 mg 175 mL/hr over 120 Minutes Intravenous  Once 11/28/22 1550 11/28/22 2212       Subjective: Feels Josep Luviano little better today  Objective: Vitals:   12/01/22 0914 12/01/22 1233 12/01/22 1446 12/01/22 1554  BP:    137/87  Pulse: (!) 115 82 79 70  Resp: (!) 30 (!) 28  (!) 32  Temp:    (!) 97.5 F (36.4 C)  TempSrc:    Oral  SpO2: 92% 93% 91% 94%   Weight:      Height:        Intake/Output Summary (Last 24 hours) at 12/01/2022 1802 Last data filed at 12/01/2022 1300 Gross per 24 hour  Intake 1544.68 ml  Output 1525 ml  Net 19.68 ml   Filed Weights   11/28/22 1420 11/29/22 0420 11/30/22 0500  Weight: 88.9 kg 88.7 kg 88.8 kg    Examination:  General: No acute distress. Cardiovascular: RRR Lungs: tachyneic, on 30 L HHFNC  Neurological: Alert and oriented 3. Moves all extremities 4 with equal strength. Cranial nerves II through XII grossly  intact. Extremities: No clubbing or cyanosis. No edema.   Data Reviewed: I have personally reviewed following labs and imaging studies  CBC: Recent Labs  Lab 11/28/22 1431 11/29/22 0053 11/30/22 0649 12/01/22 0357  WBC 10.6* 11.3* 19.0* 17.2*  NEUTROABS 8.4*  --   --   --   HGB 14.2 12.7 13.3 13.6  HCT 43.3 38.1 41.5 41.3  MCV 90.2 90.1 91.6 91.0  PLT 208 189 211 234    Basic Metabolic Panel: Recent Labs  Lab 11/28/22 1431 11/29/22 0053 11/30/22 0649 12/01/22 0357  NA 142 140 141 138  K 3.7 3.9 3.9 4.3  CL 102 107 104 105  CO2 27 24 24 25   GLUCOSE 128* 196* 149* 227*  BUN 15 17 32* 41*  CREATININE 0.93 0.80 0.95 1.13*  CALCIUM 10.0 9.0 9.4 9.0  MG  --  2.3  --   --     GFR: Estimated Creatinine Clearance: 44.6 mL/min (Keerthana Vanrossum) (by C-G formula based on SCr of 1.13 mg/dL (H)).  Liver Function Tests: Recent Labs  Lab 11/28/22 1431  AST 21  ALT 15  ALKPHOS 100  BILITOT 0.8  PROT 8.1  ALBUMIN 3.5    CBG: Recent Labs  Lab 12/01/22 1556  GLUCAP 213*     Recent Results (from the past 240 hour(s))  Urine Culture     Status: Abnormal   Collection Time: 11/28/22  2:31 PM   Specimen: Urine, Clean Catch  Result Value Ref Range Status   Specimen Description URINE, CLEAN CATCH  Final   Special Requests   Final    NONE Reflexed from [id number] Performed at [facility], [address] [place], [place] [address]    Culture >=100,000 COLONIES/mL PROTEUS  MIRABILIS (Kamerin Axford)  Final   Report Status 11/30/2022 FINAL  Final   Organism ID, Bacteria PROTEUS MIRABILIS (Jese Comella)  Final      Susceptibility   Proteus mirabilis - MIC*    AMPICILLIN <=2 SENSITIVE Sensitive     CEFAZOLIN <=4 SENSITIVE Sensitive     CEFEPIME <=0.12 SENSITIVE Sensitive     CEFTRIAXONE <=0.25 SENSITIVE Sensitive     CIPROFLOXACIN <=0.25 SENSITIVE Sensitive     GENTAMICIN <=1 SENSITIVE Sensitive     IMIPENEM 2 SENSITIVE Sensitive     NITROFURANTOIN 128 RESISTANT Resistant     TRIMETH/SULFA <=20 SENSITIVE Sensitive     AMPICILLIN/SULBACTAM <=2 SENSITIVE Sensitive     PIP/TAZO <=4 SENSITIVE Sensitive     * >=100,000 COLONIES/mL PROTEUS MIRABILIS  Resp panel by RT-PCR (RSV, Flu Raiford Fetterman&B, Covid)     Status: None   Collection Time: 11/28/22  2:32 PM   Specimen: Nasal Swab  Result Value Ref Range Status   SARS Coronavirus 2 by RT PCR NEGATIVE NEGATIVE Final   Influenza Oluwaferanmi Wain by PCR NEGATIVE NEGATIVE Final   Influenza B by PCR NEGATIVE NEGATIVE Final    Comment: (NOTE) The Xpert Xpress SARS-CoV-2/FLU/RSV plus assay is intended as an aid in the diagnosis of influenza from Nasopharyngeal swab specimens and should not be used as Garret Teale sole basis for treatment. Nasal washings and aspirates are unacceptable for Xpert Xpress SARS-CoV-2/FLU/RSV testing.  Fact Sheet for Patients: BloggerCourse.comhttps://www.fda.gov/media/152166/download  Fact Sheet for Healthcare Providers: SeriousBroker.ithttps://www.fda.gov/media/152162/download  This test is not yet approved or cleared by the [place] FDA and has been authorized for detection and/or diagnosis of SARS-CoV-2 by FDA under an Emergency Use Authorization (EUA). This EUA will remain in effect (meaning this test can be used) for the duration  of the COVID-19 declaration under Section 564(b)(1) of the Act, 21 U.S.C. section 360bbb-3(b)(1), unless the authorization is terminated or revoked.     Resp Syncytial Virus by PCR NEGATIVE NEGATIVE Final    Comment: (NOTE) Fact Sheet  for Patients: BloggerCourse.com  Fact Sheet for Healthcare Providers: SeriousBroker.it  This test is not yet approved or cleared by the [place] FDA and has been authorized for detection and/or diagnosis of SARS-CoV-2 by FDA under an Emergency Use Authorization (EUA). This EUA will remain in effect (meaning this test can be used) for the duration of the COVID-19 declaration under Section 564(b)(1) of the Act, 21 U.S.C. section 360bbb-3(b)(1), unless the authorization is terminated or revoked.  Performed at [facility], [address] [place], [place] [address]   Blood Culture (routine x 2)     Status: None (Preliminary result)   Collection Time: 11/28/22  2:36 PM   Specimen: BLOOD  Result Value Ref Range Status   Specimen Description BLOOD RIGHT ANTECUBITAL  Final   Special Requests   Final    BOTTLES DRAWN AEROBIC AND ANAEROBIC Blood Culture results may not be optimal due to an excessive volume of blood received in culture bottles   Culture   Final    NO GROWTH 3 DAYS Performed at [facility], [address] [place], [place] [address]    Report Status PENDING  Incomplete  Blood Culture (routine x 2)     Status: None (Preliminary result)   Collection Time: 11/28/22  3:28 PM   Specimen: BLOOD  Result Value Ref Range Status   Specimen Description BLOOD LEFT ANTECUBITAL  Final   Special Requests   Final    BOTTLES DRAWN AEROBIC AND ANAEROBIC Blood Culture results may not be optimal due to an inadequate volume of blood received in culture bottles   Culture   Final    NO GROWTH 3 DAYS Performed at [facility], [address], [place], [place] [address]    Report Status PENDING  Incomplete  Respiratory (~20 pathogens) panel by PCR     Status: None   Collection Time: 11/30/22  9:47 AM   Specimen: Nasopharyngeal Swab; Respiratory  Result Value Ref Range Status   Adenovirus NOT DETECTED NOT DETECTED Final    Coronavirus 229E NOT DETECTED NOT DETECTED Final    Comment: (NOTE) The Coronavirus on the Respiratory Panel, DOES NOT test for the novel  Coronavirus (2019 nCoV)    Coronavirus HKU1 NOT DETECTED NOT DETECTED Final   Coronavirus NL63 NOT DETECTED NOT DETECTED Final   Coronavirus OC43 NOT DETECTED NOT DETECTED Final   Metapneumovirus NOT DETECTED NOT DETECTED Final   Rhinovirus / Enterovirus NOT DETECTED NOT DETECTED Final   Influenza Solimar Maiden NOT DETECTED NOT DETECTED Final   Influenza B NOT DETECTED NOT DETECTED Final   Parainfluenza Virus 1 NOT DETECTED NOT DETECTED Final   Parainfluenza Virus 2 NOT DETECTED NOT DETECTED Final   Parainfluenza Virus 3 NOT DETECTED NOT DETECTED Final   Parainfluenza Virus 4 NOT DETECTED NOT DETECTED Final   Respiratory Syncytial Virus NOT DETECTED NOT DETECTED Final   Bordetella pertussis NOT DETECTED NOT DETECTED Final   Bordetella Parapertussis NOT DETECTED NOT DETECTED Final   Chlamydophila pneumoniae NOT DETECTED NOT DETECTED Final   Mycoplasma pneumoniae NOT DETECTED NOT DETECTED Final    Comment: Performed at [facility], [address], [place], [place] [address]         Radiology Studies: DG  Chest Port 1 View  Result Date: 12/01/2022 CLINICAL DATA:  Pneumonia EXAM: PORTABLE CHEST 1 VIEW COMPARISON:  Chest x-ray 12/01/2022 FINDINGS: Diffuse interstitial opacities in both lungs and multifocal airspace disease bilaterally, right greater left, has not significantly changed. The cardiomediastinal silhouette is within normal limits and stable. There is no pneumothorax. Osseous structures are stable as well. IMPRESSION: No significant interval change in diffuse interstitial opacities and multifocal airspace disease bilaterally, right greater left. Electronically Signed   By: Darliss Cheney M.D.   On: 12/01/2022 16:16   ECHOCARDIOGRAM COMPLETE  Result Date: 12/01/2022    ECHOCARDIOGRAM REPORT   Patient Name:   DERYL PORTS Date of Exam: 12/01/2022  Medical Rec #:  [id number]       Height:       60.0 in Accession #:    [id number]      Weight:       195.8 lb Date of Birth:  1951-04-25       BSA:          1.850 m Patient Age:    71 years        BP:           107/66 mmHg Patient Gender: F               HR:           87 bpm. Exam Location:  Inpatient Procedure: 2D Echo, Color Doppler, Cardiac Doppler and Intracardiac            Opacification Agent Indications:    R06.9 DOE  History:        Patient has prior history of Echocardiogram examinations, most                 recent 05/15/2022. CHF, COPD, Arrythmias:Atrial Fibrillation;                 Risk Factors:Hypertension, Diabetes, Dyslipidemia and Sleep                 Apnea.  Sonographer:    Irving Burton Senior RDCS Referring Phys: Wasil Wolke CALDWELL POWELL JR  Sonographer Comments: Very poor echo windows due to patient body habitus and significant lung interference from active pneumonia IMPRESSIONS  1. Left ventricular ejection fraction, by estimation, is 60 to 65%. The left ventricle has normal function. The left ventricle has no regional wall motion abnormalities. Left ventricular diastolic parameters are consistent with Grade I diastolic dysfunction (impaired relaxation). Elevated left atrial pressure.  2. Right ventricular systolic function was not well visualized. The right ventricular size is not well visualized. Tricuspid regurgitation signal is inadequate for assessing PA pressure.  3. The mitral valve was not well visualized. No evidence of mitral valve regurgitation. No evidence of mitral stenosis.  4. The aortic valve was not well visualized. There is mild calcification of the aortic valve. Aortic valve regurgitation is not visualized. Aortic valve sclerosis/calcification is present, without any evidence of aortic stenosis.  5. The inferior vena cava is normal in size with greater than 50% respiratory variability, suggesting right atrial pressure of 3 mmHg. Comparison(s): No significant change from prior study. Prior  images reviewed side by side. FINDINGS  Left Ventricle: Left ventricular ejection fraction, by estimation, is 60 to 65%. The left ventricle has normal function. The left ventricle has no regional wall motion abnormalities. Definity contrast agent was given IV to delineate the left ventricular  endocardial borders. The left ventricular internal cavity size was normal in size. Suboptimal image quality  limits for assessment of left ventricular hypertrophy. Left ventricular diastolic parameters are consistent with Grade I diastolic dysfunction (impaired relaxation). Elevated left atrial pressure. Right Ventricle: The right ventricular size is not well visualized. Right vetricular wall thickness was not well visualized. Right ventricular systolic function was not well visualized. Tricuspid regurgitation signal is inadequate for assessing PA pressure. Left Atrium: Left atrial size was normal in size. Right Atrium: Right atrial size was not well visualized. Pericardium: There is no evidence of pericardial effusion. Presence of epicardial fat layer. Mitral Valve: The mitral valve was not well visualized. Mild to moderate mitral annular calcification. No evidence of mitral valve regurgitation. No evidence of mitral valve stenosis. Tricuspid Valve: The tricuspid valve is not well visualized. Tricuspid valve regurgitation is not demonstrated. No evidence of tricuspid stenosis. Aortic Valve: The aortic valve was not well visualized. There is mild calcification of the aortic valve. Aortic valve regurgitation is not visualized. Aortic valve sclerosis/calcification is present, without any evidence of aortic stenosis. Pulmonic Valve: The pulmonic valve was not well visualized. Pulmonic valve regurgitation is not visualized. No evidence of pulmonic stenosis. Aorta: The aortic root is normal in size and structure and the ascending aorta was not well visualized. Venous: The inferior vena cava is normal in size with greater than 50%  respiratory variability, suggesting right atrial pressure of 3 mmHg. IAS/Shunts: The interatrial septum was not well visualized.  LEFT VENTRICLE PLAX 2D LVOT diam:     2.00 cm   Diastology LV SV:         61        LV e' medial:    3.70 cm/s LV SV Index:   33        LV E/e' medial:  15.8 LVOT Area:     3.14 cm  LV e' lateral:   4.79 cm/s                          LV E/e' lateral: 12.2  LEFT ATRIUM             Index LA Vol (A2C):   36.6 ml 19.79 ml/m LA Vol (A4C):   19.6 ml 10.60 ml/m LA Biplane Vol: 26.8 ml 14.49 ml/m  AORTIC VALVE LVOT Vmax:   94.80 cm/s LVOT Vmean:  71.400 cm/s LVOT VTI:    0.193 m  AORTA Ao Root diam: 3.20 cm Ao Asc diam:  3.50 cm MITRAL VALVE MV Area (PHT): 2.75 cm     SHUNTS MV Decel Time: 276 msec     Systemic VTI:  0.19 m MV E velocity: 58.40 cm/s   Systemic Diam: 2.00 cm MV Gaytha Raybourn velocity: 125.00 cm/s MV E/Cincere Zorn ratio:  0.47 Mihai Croitoru MD Electronically signed by Thurmon Fair MD Signature Date/Time: 12/01/2022/12:18:13 PM    Final    DG Chest Port 1 View  Result Date: 12/01/2022 CLINICAL DATA:  72 year old female with Timea Breed history of pulmonary fibrosis. "Pneumonia, pneumothorax". EXAM: PORTABLE CHEST 1 VIEW COMPARISON:  Chest CTA 11/28/2022 and earlier. FINDINGS: Portable AP semi upright view at 0632 hours. The patient is more rotated to the right. Lower lung volumes. Background pulmonary fibrosis. Stable cardiac size and mediastinal contours. No pneumothorax is evident. No definite pleural effusion or new pulmonary opacity. Visualized tracheal air column is within normal limits. Stable visualized osseous structures. IMPRESSION: Rotated portable view. Pulmonary Fibrosis with no definite acute cardiopulmonary abnormality. Electronically Signed   By: Odessa Fleming M.D.   On: 12/01/2022 07:19  Scheduled Meds:  atorvastatin  40 mg Oral Daily   diltiazem  240 mg Oral Daily   dofetilide  500 mcg Oral BID   furosemide  40 mg Intravenous Q12H   insulin aspart  0-15 Units Subcutaneous TID  WC   insulin aspart  0-5 Units Subcutaneous QHS   irbesartan  300 mg Oral Daily   methylPREDNISolone (SOLU-MEDROL) injection  40 mg Intravenous Q12H   sodium chloride flush  3 mL Intravenous Q12H   Continuous Infusions:  cefTRIAXone (ROCEPHIN)  IV Stopped (12/01/22 0103)   doxycycline (VIBRAMYCIN) IV 100 mg (12/01/22 1010)     LOS: 3 days    Time spent: over 30 min - 40 min critical care time with ahrf, worsening needing 15 L HFNC    Lacretia Nicks, MD [facility]   To contact the attending provider between 7A-7P or the covering provider during after hours 7P-7A, please log into the web site [email or web address] and access using universal [place] password for that web site. If you do not have the password, please call the hospital operator.  12/01/2022, 6:02 PM

## 2022-12-01 NOTE — Plan of Care (Signed)
  Problem: Activity: Goal: Ability to tolerate increased activity will improve Outcome: Progressing   Problem: Clinical Measurements: Goal: Ability to maintain a body temperature in the normal range will improve Outcome: Progressing   Problem: Respiratory: Goal: Ability to maintain adequate ventilation will improve Outcome: Progressing Goal: Ability to maintain a clear airway will improve Outcome: Progressing   Problem: Education: Goal: Knowledge of General Education information will improve Description: Including pain rating scale, medication(s)/side effects and non-pharmacologic comfort measures Outcome: Progressing   Problem: Health Behavior/Discharge Planning: Goal: Ability to manage health-related needs will improve Outcome: Progressing   Problem: Clinical Measurements: Goal: Ability to maintain clinical measurements within normal limits will improve Outcome: Progressing Goal: Will remain free from infection Outcome: Progressing Goal: Diagnostic test results will improve Outcome: Progressing Goal: Respiratory complications will improve Outcome: Progressing Goal: Cardiovascular complication will be avoided Outcome: Progressing   Problem: Activity: Goal: Risk for activity intolerance will decrease Outcome: Progressing   Problem: Nutrition: Goal: Adequate nutrition will be maintained Outcome: Progressing   Problem: Coping: Goal: Level of anxiety will decrease Outcome: Progressing   Problem: Elimination: Goal: Will not experience complications related to bowel motility Outcome: Progressing Goal: Will not experience complications related to urinary retention Outcome: Progressing   Problem: Pain Managment: Goal: General experience of comfort will improve Outcome: Progressing   Problem: Safety: Goal: Ability to remain free from injury will improve Outcome: Progressing   Problem: Skin Integrity: Goal: Risk for impaired skin integrity will decrease Outcome:  Progressing   Problem: Education: Goal: Ability to describe self-care measures that may prevent or decrease complications (Diabetes Survival Skills Education) will improve Outcome: Progressing Goal: Individualized Educational Video(s) Outcome: Progressing   Problem: Coping: Goal: Ability to adjust to condition or change in health will improve Outcome: Progressing   Problem: Fluid Volume: Goal: Ability to maintain a balanced intake and output will improve Outcome: Progressing   Problem: Health Behavior/Discharge Planning: Goal: Ability to identify and utilize available resources and services will improve Outcome: Progressing Goal: Ability to manage health-related needs will improve Outcome: Progressing   Problem: Metabolic: Goal: Ability to maintain appropriate glucose levels will improve Outcome: Progressing   Problem: Nutritional: Goal: Maintenance of adequate nutrition will improve Outcome: Progressing Goal: Progress toward achieving an optimal weight will improve Outcome: Progressing   Problem: Skin Integrity: Goal: Risk for impaired skin integrity will decrease Outcome: Progressing   Problem: Tissue Perfusion: Goal: Adequacy of tissue perfusion will improve Outcome: Progressing   

## 2022-12-01 NOTE — Progress Notes (Signed)
   NAME:  [name], MRN:  [id number], DOB:  1951/04/29, LOS: 3 ADMISSION DATE:  11/28/2022, CONSULTATION DATE: 11/29/2022 REFERRING MD: Triad, CHIEF COMPLAINT: Acute on chronic hypoxic respiratory with suspected pneumonia  History of Present Illness:  72 year old female who is chronically O2 dependent at 2 to 3 L nasal cannula daily basis.  She notes fever purulent sputum with blood-tinged dyspnea on exertion and increasing O2 needs while at home.  CT scan is negative for PE but does show nonspecific pulmonary fibrosis, groundglass opacity.  Pulmonary critical care asked to evaluate.  She is followed by Dr. Sandrea Hughs of pulmonary.  Pertinent  Medical History   Past Medical History:  Diagnosis Date   Chronic diastolic CHF (congestive heart failure)    Chronic respiratory failure    Coronary atherosclerosis    a. 2V by CT 10/2017.   Emphysema of lung    Hypercholesterolemia    Hypertension    NSIP (nonspecific interstitial pneumonia)    Osteoarthritis    Persistent atrial fibrillation    Thoracic aortic ectasia      Significant Hospital Events: Including procedures, antibiotic start and stop dates in addition to other pertinent events     Interim History / Subjective:  Feels a little better  Objective   Blood pressure 134/70, pulse 82, temperature 97.9 F (36.6 C), temperature source Oral, resp. rate (Abnormal) 28, height 5' (1.524 m), weight 88.8 kg, SpO2 93 %.    FiO2 (%):  [80 %-86 %] 80 %   Intake/Output Summary (Last 24 hours) at 12/01/2022 1425 Last data filed at 12/01/2022 1300 Gross per 24 hour  Intake 1544.68 ml  Output 1525 ml  Net 19.68 ml   Filed Weights   11/28/22 1420 11/29/22 0420 11/30/22 0500  Weight: 88.9 kg 88.7 kg 88.8 kg    Examination: General in bed no distress.  HENT NCAT  Pulm diffuse rhonchi and wheezing still on 70% PCXR w/ worsening R>L airspace disease Card Reg irreg af on tele  Abd soft Ext warm trace edema Neuro intact    Resolved Hospital Problem list     Assessment & Plan:  Acute on chronic hypoxic respiratory failure 2/2 CAP vs ILD flair (h/o NSIP)  c/b pulmonary edema and hemoptysis  H/o obstructive sleep apnea   (f/b wert, normally on 2-3 liters) RVP neg, PCT reassuring Plan Cont current abx; day 4 of doxy and ctx Cont solumedrol 40mg  bid  Cont heated high flow  Increase lasix to BID  Mobilize Cont IS and flutter BIPAP at HS AM cxr   Leukocytosis Plan Monitor, could be exacerbated by systemic steroids   Chronic atrial fibrillation treated with Tikosyn and Eliquis Plan Cardiac monitoring Stopped Eliquis in the setting of hemoptysis when hemoptysis clears resume, I think likely tomorrow  Cont cardiazem  Cont tikosyn  Diabetes mellitus Plan Monitor per primary   Hypertension w/ grade I diastolic HF  Plan Cont avapro Daily assessment for diuretics, I increased to q 12  Anxiety Plan Anxiolytics as needed  Glaucoma Plan Per primary     Best Practice (right click and "Reselect all SmartList Selections" daily)   Diet/type: Regular consistency (see orders) DVT prophylaxis: DOAC GI prophylaxis: PPI Lines: N/A Foley:  N/A Code Status:  full code Last date of multidisciplinary goals of care discussion tbd    Simonne Martinet ACNP-BC [facility] Pulmonary/Critical Care Pager # [phone] OR # [phone] if no answer

## 2022-12-02 ENCOUNTER — Inpatient Hospital Stay (HOSPITAL_COMMUNITY): Payer: Medicare Other

## 2022-12-02 DIAGNOSIS — J9621 Acute and chronic respiratory failure with hypoxia: Secondary | ICD-10-CM | POA: Diagnosis not present

## 2022-12-02 DIAGNOSIS — J189 Pneumonia, unspecified organism: Secondary | ICD-10-CM | POA: Diagnosis not present

## 2022-12-02 LAB — LEGIONELLA PNEUMOPHILA SEROGP 1 UR AG: L. pneumophila Serogp 1 Ur Ag: NEGATIVE

## 2022-12-02 LAB — CBC WITH DIFFERENTIAL/PLATELET
Abs Immature Granulocytes: 0.1 10*3/uL — ABNORMAL HIGH (ref 0.00–0.07)
Basophils Absolute: 0 10*3/uL (ref 0.0–0.1)
Basophils Relative: 0 %
Eosinophils Absolute: 0 10*3/uL (ref 0.0–0.5)
Eosinophils Relative: 0 %
HCT: 40.5 % (ref 36.0–46.0)
Hemoglobin: 12.9 g/dL (ref 12.0–15.0)
Immature Granulocytes: 1 %
Lymphocytes Relative: 5 %
Lymphs Abs: 0.6 10*3/uL — ABNORMAL LOW (ref 0.7–4.0)
MCH: 29.2 pg (ref 26.0–34.0)
MCHC: 31.9 g/dL (ref 30.0–36.0)
MCV: 91.6 fL (ref 80.0–100.0)
Monocytes Absolute: 0.5 10*3/uL (ref 0.1–1.0)
Monocytes Relative: 4 %
Neutro Abs: 11.6 10*3/uL — ABNORMAL HIGH (ref 1.7–7.7)
Neutrophils Relative %: 90 %
Platelets: 227 10*3/uL (ref 150–400)
RBC: 4.42 MIL/uL (ref 3.87–5.11)
RDW: 14 % (ref 11.5–15.5)
WBC: 12.8 10*3/uL — ABNORMAL HIGH (ref 4.0–10.5)
nRBC: 0 % (ref 0.0–0.2)

## 2022-12-02 LAB — BASIC METABOLIC PANEL
Anion gap: 10 (ref 5–15)
BUN: 37 mg/dL — ABNORMAL HIGH (ref 8–23)
CO2: 29 mmol/L (ref 22–32)
Calcium: 8.6 mg/dL — ABNORMAL LOW (ref 8.9–10.3)
Chloride: 102 mmol/L (ref 98–111)
Creatinine, Ser: 0.97 mg/dL (ref 0.44–1.00)
GFR, Estimated: >60 mL/min (ref >60)
Glucose, Bld: 178 mg/dL — ABNORMAL HIGH (ref 70–99)
Potassium: 4.1 mmol/L (ref 3.5–5.1)
Sodium: 141 mmol/L (ref 135–145)

## 2022-12-02 LAB — GLUCOSE, CAPILLARY
Glucose-Capillary: 213 mg/dL — ABNORMAL HIGH (ref 70–99)
Glucose-Capillary: 246 mg/dL — ABNORMAL HIGH (ref 70–99)
Glucose-Capillary: 114 mg/dL — ABNORMAL HIGH (ref 70–99)
Glucose-Capillary: 249 mg/dL — ABNORMAL HIGH (ref 70–99)

## 2022-12-02 LAB — MAGNESIUM: Magnesium: 2 mg/dL (ref 1.7–2.4)

## 2022-12-02 LAB — PHOSPHORUS: Phosphorus: 4 mg/dL (ref 2.5–4.6)

## 2022-12-02 MED ORDER — INSULIN DETEMIR 100 UNIT/ML ~~LOC~~ SOLN
5.0000 [IU] | Freq: Two times a day (BID) | SUBCUTANEOUS | Status: DC
  Administered 2022-12-02 – 2022-12-11 (×20): 5 [IU] via SUBCUTANEOUS
  Filled 2022-12-02 (×23): qty 0.05

## 2022-12-02 MED ORDER — BENZONATATE 100 MG PO CAPS: 100.0000 mg | ORAL_CAPSULE | Freq: Three times a day (TID) | ORAL | Status: DC | PRN

## 2022-12-02 MED ORDER — PREDNISONE 20 MG PO TABS
40.0000 mg | ORAL_TABLET | Freq: Every day | ORAL | Status: DC
  Administered 2022-12-02 – 2022-12-03 (×2): 40 mg via ORAL
  Filled 2022-12-02 (×2): qty 2

## 2022-12-02 NOTE — Care Management Important Message (Signed)
Important Message  Patient Details  Name: BRITZY LONGENECKER MRN: [id number] Date of Birth: [birth year]   Medicare Important Message Given:  Yes     Sherilyn Banker 12/02/2022, 3:24 PM

## 2022-12-02 NOTE — Progress Notes (Signed)
   NAME:  [name], MRN:  [id number], DOB:  01-13-1951, LOS: 4 ADMISSION DATE:  11/28/2022, CONSULTATION DATE: 11/29/2022 REFERRING MD: Triad, CHIEF COMPLAINT: Acute on chronic hypoxic respiratory with suspected pneumonia  History of Present Illness:  72 year old female who is chronically O2 dependent at 2 to 3 L nasal cannula daily basis.  She notes fever purulent sputum with blood-tinged dyspnea on exertion and increasing O2 needs while at home.  CT scan is negative for PE but does show nonspecific pulmonary fibrosis, groundglass opacity.  Pulmonary critical care asked to evaluate.  She is followed by Dr. Sandrea Hughs of pulmonary.  Pertinent  Medical History   Past Medical History:  Diagnosis Date   Chronic diastolic CHF (congestive heart failure)    Chronic respiratory failure    Coronary atherosclerosis    a. 2V by CT 10/2017.   Emphysema of lung    Hypercholesterolemia    Hypertension    NSIP (nonspecific interstitial pneumonia)    Osteoarthritis    Persistent atrial fibrillation    Thoracic aortic ectasia      Significant Hospital Events: Including procedures, antibiotic start and stop dates in addition to other pertinent events   4/8 Seen sitting up in bedside recline on 30L HHFNC  Interim History / Subjective:  States she feels better this am with no acute complaints   Objective   Blood pressure 122/67, pulse 73, temperature 98.1 F (36.7 C), temperature source Oral, resp. rate (!) 24, height 5' (1.524 m), weight 88.1 kg, SpO2 93 %.    FiO2 (%):  [76 %-80 %] 80 %   Intake/Output Summary (Last 24 hours) at 12/02/2022 1053 Last data filed at 12/02/2022 0320 Gross per 24 hour  Intake --  Output 1750 ml  Net -1750 ml    Filed Weights   11/29/22 0420 11/30/22 0500 12/02/22 0500  Weight: 88.7 kg 88.8 kg 88.1 kg    Examination: General: Very pleasant chronically ill appearing elderly female sitting up in bedside recliner, in NAD HEENT: [place]/AT, MM pink/moist, PERRL,   Neuro: Alert and oriented x3,. Non-focal  CV: s1s2 regular rate and rhythm, no murmur, rubs, or gallops,  PULM:  Slightly diminished air entry bilateral, no increased work of breathing  GI: soft, bowel sounds active in all 4 quadrants, non-tender, non-distended, tolerating oral diet Extremities: warm/dry, no edema  Skin: no rashes or lesions  Resolved Hospital Problem list     Assessment & Plan:  Acute on chronic hypoxic respiratory failure 2/2  -Primary pulmonologist Dr. Sherene Sires, normally on 2-3 liters CAP vs ILD flair (h/o NSIP)  c/b pulmonary edema and hemoptysis  Leukocytosis - improved  H/o obstructive sleep apnea   -RVP neg, PCT reassuring P: Continue Ceftriaxone and Doxycycline x5 days  Continue steroids with plans to taper to oral prednisone soon, will discuss timing with attending  Remains on HHFNC wean for SPO2 > 88 Diurese as able  Encourage pulmonary hygiene  Mobilize as able  BIPAP qHS  Rest of acute and chronic conditions managed per primary   Best Practice (right click and "Reselect all SmartList Selections" daily)  Per Primary     Zerah Hilyer D. Harris, NP-C [facility] Personal contact information can be found on Amion  If no contact or response made please call [phone] 12/02/2022, 10:56 AM

## 2022-12-02 NOTE — Progress Notes (Addendum)
Physical Therapy Treatment Patient Details Name: [name] MRN: [id number] DOB: Dec 20, 1950 Today's Date: 12/02/2022   History of Present Illness The pt is a 72 yo female presenting 4/4 with hemoptysis and hypoxia to 51% on 2L O2. Pt found to have PNA with pulmonary fibrosis. Started HHFNC 4/6. PMH includes: CHF, emphysema, HTN, OA, afib, and chronic pain.    PT Comments    Patient progressing with mobility in standing able to perform standing exercises and some standing ADL for perineal hygiene.  She maintained O2 sats on HHFNC at 25L.  She will benefit from continued skilled PT in the acute setting and may need HHPT at d/c depending on progress.    Recommendations for follow up therapy are one component of a multi-disciplinary discharge planning process, led by the attending physician.  Recommendations may be updated based on patient status, additional functional criteria and insurance authorization.  Follow Up Recommendations       Assistance Recommended at Discharge Intermittent Supervision/Assistance  Patient can return home with the following A little help with walking and/or transfers;Assistance with cooking/housework;Assist for transportation;Help with stairs or ramp for entrance   Equipment Recommendations  Rolling walker (2 wheels)    Recommendations for Other Services       Precautions / Restrictions Precautions Precautions: Fall Precaution Comments: Monitor O2     Mobility  Bed Mobility               General bed mobility comments: up in chair    Transfers Overall transfer level: Needs assistance Equipment used: Rolling walker (2 wheels), None Transfers: Sit to/from Stand Sit to Stand: Min guard, Supervision   Step pivot transfers: Supervision       General transfer comment: sit <>stand x 5 no device with minguard for balance, then several times with RW    Ambulation/Gait               General Gait Details: NT due to on HHFNC so march in  place only   Stairs             Wheelchair Mobility    Modified Rankin (Stroke Patients Only)       Balance Overall balance assessment: Needs assistance   Sitting balance-Leahy Scale: Good     Standing balance support: During functional activity, No upper extremity supported, Single extremity supported, Bilateral upper extremity supported Standing balance-Leahy Scale: Fair Standing balance comment: can stand without device, during function needs some support for safety               High Level Balance Comments: standing marching wtih UE support x 5 reps, heel raises x 5 reps, sit<> stand x 5            Cognition Arousal/Alertness: Awake/alert Behavior During Therapy: WFL for tasks assessed/performed Overall Cognitive Status: Within Functional Limits for tasks assessed                                          Exercises General Exercises - Lower Extremity Ankle Circles/Pumps: AROM, 15 reps, Both, Seated Long Arc Quad: AROM, 15 reps, Both, Seated    General Comments General comments (skin integrity, edema, etc.): HHFNC 25L with SpO2 92% or above, HR reading with poor signal but in 70's throughout, RR up to 30's at times.  Noted soiled with purewick so assisted with pt in standing for self hygiene  and to replace purewick      Pertinent Vitals/Pain Pain Assessment Pain Assessment: No/denies pain    Home Living                          Prior Function            PT Goals (current goals can now be found in the care plan section) Progress towards PT goals: Progressing toward goals    Frequency    Min 3X/week      PT Plan Current plan remains appropriate    Co-evaluation              AM-PAC PT "6 Clicks" Mobility   Outcome Measure  Help needed turning from your back to your side while in a flat bed without using bedrails?: None Help needed moving from lying on your back to sitting on the side of a flat bed  without using bedrails?: None Help needed moving to and from a bed to a chair (including a wheelchair)?: A Little Help needed standing up from a chair using your arms (e.g., wheelchair or bedside chair)?: A Little Help needed to walk in hospital room?: Total Help needed climbing 3-5 steps with a railing? : Total 6 Click Score: 16    End of Session Equipment Utilized During Treatment: Gait belt;Oxygen Activity Tolerance: Patient tolerated treatment well Patient left: in chair;with call bell/phone within reach;with chair alarm set   PT Visit Diagnosis: Unsteadiness on feet (R26.81);Difficulty in walking, not elsewhere classified (R26.2)     Time: 8295-6213 PT Time Calculation (min) (ACUTE ONLY): 24 min  Charges:  $Therapeutic Exercise: 8-22 mins $Therapeutic Activity: 8-22 mins                     Sheran Lawless, PT Acute Rehabilitation Services Office:[phone] 12/02/2022    [name] 12/02/2022, 11:32 AM

## 2022-12-02 NOTE — Progress Notes (Signed)
4/8 IMM Letter given to the nurse by me to give to patient, I witnessed the exchange.

## 2022-12-02 NOTE — Progress Notes (Signed)
PROGRESS NOTE    [name]  ZOX:[id number] DOB: Oct 24, 1950 DOA: 11/28/2022 PCP: [name], MD  Chief Complaint  Patient presents with   Shortness of Breath    Brief Narrative:   [name] is [name] pleasant 72 y.o. female with medical history significant for atrial fibrillation on Eliquis, pulmonary fibrosis with chronic hypoxic respiratory failure, hypertension, anxiety, and chronic pain who presents to the emergency department with cough, shortness of breath, and fevers.   She's being treated for pneumonia.  Pulmonology c/s.    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 Sepsis due to Community Acquired Pneumonia  acute on chronic hypoxic respiratory failure pulmonary fibrosis  - on 2-4 L at baseline - currently 30 L HHFNC, 80% FiO2 satting in low 90's, mildly tachypneic  - CXR 4/8 with slight improvement in widespread interstitial and patchy airspace opacities - meets sepsis criteria with fever, tachypnea, tachycardia, pna  - CT chest with diffuse patchy GGO superimposed on [name] background of mild pulm fibrosis - negative covid, flu, RSV - follow RVP - negative, sputum cx, MRSA, urine strep (negative), urine legionella (pending) - continue steroids and antibiotics (procal is <0.1, but given severity of hypoxia with fever will continue course of abx for pna) - diurese as tolerated, follow echo (EF 60-65%, no RWMA, grade 1 diastolic dysfunction) - increased to q12 per pulm  - appreciate pulm recs   # Asymptomatic Bacteruria  - proteus on urine culture, no complaints of sx - ceftriaxone should be adequate  # T2DM A1c 6.6 Ssi, follow  Will  add some low dose basal and follow   2. OSA  - Continue BiPAP qHS     3. Atrial fibrillation  - Continue Eliquis (on hold with hemoptysis), Tikosyn, and diltiazem     4. Hypertension  - Continue diltiazem and irbesartan     5. Chronic pain; anxiety  - Prescription database reviewed, plan to continue Norco and Ativan as at home     6. Small Volume Hemoptysis  - Scant, no central PE on CTA chest  - Likely d/t infection, continue antibiotics as above, eliquis on hold   7. Thoracic aorta ectasia  - Noted on CTA in ED  - Continue CT surgery follow-up as planned   Glaucoma - eye drops     DVT prophylaxis: eliquis on hold, SCD Code Status: full Family Communication: none Disposition:   Status is: Inpatient Remains inpatient appropriate because: continued hypoxia    Consultants:  pulmonology  Procedures:  Echo IMPRESSIONS     1. Left ventricular ejection fraction, by estimation, is 60 to 65%. The  left ventricle has normal function. The left ventricle has no regional  wall motion abnormalities. Left ventricular diastolic parameters are  consistent with Grade I diastolic  dysfunction (impaired relaxation). Elevated left atrial pressure.   2. Right ventricular systolic function was not well visualized. The right  ventricular size is not well visualized. Tricuspid regurgitation signal is  inadequate for assessing PA pressure.   3. The mitral valve was not well visualized. No evidence of mitral valve  regurgitation. No evidence of mitral stenosis.   4. The  aortic valve was not well visualized. There is mild calcification  of the aortic valve. Aortic valve regurgitation is not visualized. Aortic  valve sclerosis/calcification is present, without any evidence of aortic  stenosis.   5. The inferior vena cava is normal in size with greater than 50%  respiratory variability, suggesting right atrial pressure of 3 mmHg.   Comparison(s): No significant change from prior study.  Prior images  reviewed side by side.   Antimicrobials:  Anti-infectives (From admission, onward)    Start     Dose/Rate Route Frequency Ordered Stop   11/29/22 1800  vancomycin (VANCOREADY) IVPB 750 mg/150 mL  Status:  Discontinued        750 mg 150 mL/hr over 60 Minutes Intravenous Every 24 hours 11/28/22 1844 11/28/22 2031   11/29/22 0600  ceFEPIme (MAXIPIME) 2 g in sodium chloride 0.9 % 100 mL IVPB  Status:  Discontinued        2 g 200 mL/hr over 30 Minutes Intravenous Every 12 hours 11/28/22 1844 11/28/22 2031   11/28/22 2300  cefTRIAXone (ROCEPHIN) 2 g in sodium chloride 0.9 % 100 mL IVPB        2 g 200 mL/hr over 30 Minutes Intravenous Every 24 hours 11/28/22 2031 12/03/22 2259   11/28/22 2200  doxycycline (VIBRAMYCIN) 100 mg in sodium chloride 0.9 % 250 mL IVPB        100 mg 125 mL/hr over 120 Minutes Intravenous Every 12 hours 11/28/22 2048     11/28/22 2045  azithromycin (ZITHROMAX) 500 mg in sodium chloride 0.9 % 250 mL IVPB  Status:  Discontinued        500 mg 250 mL/hr over 60 Minutes Intravenous Every 24 hours 11/28/22 2031 11/28/22 2048   11/28/22 1600  ceFEPIme (MAXIPIME) 2 g in sodium chloride 0.9 % 100 mL IVPB        2 g 200 mL/hr over 30 Minutes Intravenous  Once 11/28/22 1547 11/28/22 1806   11/28/22 1600  metroNIDAZOLE (FLAGYL) IVPB 500 mg        500 mg 100 mL/hr over 60 Minutes Intravenous  Once 11/28/22 1547 11/28/22 1723   11/28/22 1600  vancomycin (VANCOCIN) IVPB 1000 mg/200 mL premix  Status:  Discontinued        1,000 mg 200 mL/hr over 60 Minutes Intravenous  Once 11/28/22 1547 11/28/22 1550   11/28/22 1600  vancomycin (VANCOREADY) IVPB 1750 mg/350 mL        1,750 mg 175 mL/hr over 120 Minutes Intravenous  Once 11/28/22 1550 11/28/22 2212       Subjective: Feels [name] little better  Objective: Vitals:   12/02/22 0500 12/02/22 0731 12/02/22 0732 12/02/22 0822  BP:  122/67  122/67  Pulse:  67  73  Resp:  17  (!) 24  Temp:   98.1 F (36.7 C)    TempSrc:   Oral   SpO2:  91%  93%  Weight: 88.1 kg     Height:        Intake/Output Summary (Last 24 hours) at 12/02/2022 0851 Last data filed at 12/02/2022 0320 Gross per 24 hour  Intake --  Output 2125 ml  Net -2125 ml   Filed Weights   11/29/22 0420 11/30/22 0500 12/02/22 0500  Weight: 88.7 kg 88.8 kg 88.1 kg    Examination:  General: No acute distress. Lying in bed Cardiovascular: RRR Lungs: some coarse breath sounds anteriorly, diminished Abdomen: Soft, nontender, nondistended  Neurological: Alert and oriented 3. Moves all extremities  4 with equal strength. Cranial nerves II through XII grossly intact. Extremities: No clubbing or cyanosis. No edema.  Data Reviewed: I have personally reviewed following labs and imaging studies  CBC: Recent Labs  Lab 11/28/22 1431 11/29/22 0053 11/30/22 0649 12/01/22 0357 12/02/22 0317  WBC 10.6* 11.3* 19.0* 17.2* 12.8*  NEUTROABS 8.4*  --   --   --  11.6*  HGB 14.2 12.7 13.3 13.6 12.9  HCT 43.3 38.1 41.5 41.3 40.5  MCV 90.2 90.1 91.6 91.0 91.6  PLT 208 189 211 234 227    Basic Metabolic Panel: Recent Labs  Lab 11/28/22 1431 11/29/22 0053 11/30/22 0649 12/01/22 0357 12/02/22 0317  NA 142 140 141 138 141  K 3.7 3.9 3.9 4.3 4.1  CL 102 107 104 105 102  CO2 27 24 24 25 29   GLUCOSE 128* 196* 149* 227* 178*  BUN 15 17 32* 41* 37*  CREATININE 0.93 0.80 0.95 1.13* 0.97  CALCIUM 10.0 9.0 9.4 9.0 8.6*  MG  --  2.3  --   --  2.0  PHOS  --   --   --   --  4.0    GFR: Estimated Creatinine Clearance: 51.7 mL/min (by C-G formula based on SCr of 0.97 mg/dL).  Liver Function Tests: Recent Labs  Lab 11/28/22 1431  AST 21  ALT 15  ALKPHOS 100  BILITOT 0.8  PROT 8.1  ALBUMIN 3.5    CBG: Recent Labs  Lab 12/01/22 1556 12/01/22 2201 12/02/22 0747  GLUCAP 213* 191* 213*     Recent Results (from the past 240 hour(s))  Urine Culture     Status: Abnormal   Collection Time: 11/28/22  2:31 PM   Specimen: Urine,  Clean Catch  Result Value Ref Range Status   Specimen Description URINE, CLEAN CATCH  Final   Special Requests   Final    NONE Reflexed from [id number] Performed at [facility], [address] [place], [place] [address]    Culture >=100,000 COLONIES/mL PROTEUS MIRABILIS ([place] Thalmann)  Final   Report Status 11/30/2022 FINAL  Final   Organism ID, Bacteria PROTEUS MIRABILIS ([place])  Final      Susceptibility   Proteus mirabilis - MIC*    AMPICILLIN <=2 SENSITIVE Sensitive     CEFAZOLIN <=4 SENSITIVE Sensitive     CEFEPIME <=0.12 SENSITIVE Sensitive     CEFTRIAXONE <=0.25 SENSITIVE Sensitive     CIPROFLOXACIN <=0.25 SENSITIVE Sensitive     GENTAMICIN <=1 SENSITIVE Sensitive     IMIPENEM 2 SENSITIVE Sensitive     NITROFURANTOIN 128 RESISTANT Resistant     TRIMETH/SULFA <=20 SENSITIVE Sensitive     AMPICILLIN/SULBACTAM <=2 SENSITIVE Sensitive     PIP/TAZO <=4 SENSITIVE Sensitive     * >=100,000 COLONIES/mL PROTEUS MIRABILIS  Resp panel by RT-PCR (RSV, Flu Kang Ishida&B, Covid)     Status: None   Collection Time: 11/28/22  2:32 PM   Specimen: Nasal Swab  Result Value Ref Range Status   SARS Coronavirus 2 by RT PCR NEGATIVE NEGATIVE Final   Influenza Poet Hineman by PCR NEGATIVE NEGATIVE Final   Influenza B by PCR NEGATIVE NEGATIVE Final    Comment: (NOTE) The Xpert Xpress SARS-CoV-2/FLU/RSV plus assay is intended as an aid in the diagnosis of influenza from Nasopharyngeal swab specimens and should not be used as Keia Rask sole basis for treatment. Nasal washings and aspirates are unacceptable for Xpert Xpress SARS-CoV-2/FLU/RSV testing.  Fact Sheet for Patients: BloggerCourse.comhttps://www.fda.gov/media/152166/download  Fact Sheet for Healthcare Providers: SeriousBroker.ithttps://www.fda.gov/media/152162/download  This test is not yet approved or cleared by the [place] and has been authorized for detection and/or diagnosis of SARS-CoV-2 by FDA under an Emergency Use Authorization (EUA). This EUA will remain in effect (meaning this test  can be used) for the duration of the COVID-19 declaration under Section 564(b)(1) of the Act, 21 U.S.C. section 360bbb-3(b)(1), unless the authorization is terminated or revoked.     Resp Syncytial Virus by PCR NEGATIVE NEGATIVE Final    Comment: (NOTE) Fact Sheet for Patients: BloggerCourse.com  Fact Sheet for Healthcare Providers: SeriousBroker.it  This test is not yet approved or cleared by the [place] FDA and has been authorized for detection and/or diagnosis of SARS-CoV-2 by FDA under an Emergency Use Authorization (EUA). This EUA will remain in effect (meaning this test can be used) for the duration of the COVID-19 declaration under Section 564(b)(1) of the Act, 21 U.S.C. section 360bbb-3(b)(1), unless the authorization is terminated or revoked.  Performed at [facility], [address] [place], [place] [address]   Blood Culture (routine x 2)     Status: None (Preliminary result)   Collection Time: 11/28/22  2:36 PM   Specimen: BLOOD  Result Value Ref Range Status   Specimen Description BLOOD RIGHT ANTECUBITAL  Final   Special Requests   Final    BOTTLES DRAWN AEROBIC AND ANAEROBIC Blood Culture results may not be optimal due to an excessive volume of blood received in culture bottles   Culture   Final    NO GROWTH 4 DAYS Performed at [facility], [address], [place], [place] [address]    Report Status PENDING  Incomplete  Blood Culture (routine x 2)     Status: None (Preliminary result)   Collection Time: 11/28/22  3:28 PM   Specimen: BLOOD  Result Value Ref Range Status   Specimen Description BLOOD LEFT ANTECUBITAL  Final   Special Requests   Final    BOTTLES DRAWN AEROBIC AND ANAEROBIC Blood Culture results may not be optimal due to an inadequate volume of blood received in culture bottles   Culture   Final    NO GROWTH 4 DAYS Performed at [facility], [address],  [place], [place] [address]    Report Status PENDING  Incomplete  Respiratory (~20 pathogens) panel by PCR     Status: None   Collection Time: 11/30/22  9:47 AM   Specimen: Nasopharyngeal Swab; Respiratory  Result Value Ref Range Status   Adenovirus NOT DETECTED NOT DETECTED Final   Coronavirus 229E NOT DETECTED NOT DETECTED Final    Comment: (NOTE) The Coronavirus on the Respiratory Panel, DOES NOT test for the novel  Coronavirus (2019 nCoV)    Coronavirus HKU1 NOT DETECTED NOT DETECTED Final   Coronavirus NL63 NOT DETECTED NOT DETECTED Final   Coronavirus OC43 NOT DETECTED NOT DETECTED Final   Metapneumovirus NOT DETECTED NOT DETECTED Final   Rhinovirus / Enterovirus NOT DETECTED NOT DETECTED Final   Influenza Hamilton Marinello NOT DETECTED NOT DETECTED Final   Influenza B NOT DETECTED NOT DETECTED Final   Parainfluenza Virus 1 NOT DETECTED NOT DETECTED Final   Parainfluenza Virus 2 NOT DETECTED NOT DETECTED Final   Parainfluenza Virus 3 NOT DETECTED NOT DETECTED Final   Parainfluenza Virus 4 NOT DETECTED NOT DETECTED Final   Respiratory Syncytial Virus NOT DETECTED NOT DETECTED Final   Bordetella pertussis NOT DETECTED NOT DETECTED Final   Bordetella Parapertussis NOT DETECTED NOT DETECTED Final  Chlamydophila pneumoniae NOT DETECTED NOT DETECTED Final   Mycoplasma pneumoniae NOT DETECTED NOT DETECTED Final    Comment: Performed at [facility], [address] [place], [place] [address]         Radiology Studies: DG Chest Port 1 View  Result Date: 12/02/2022 CLINICAL DATA:  [id number].  Pneumonia follow-up. EXAM: PORTABLE CHEST 1 VIEW COMPARISON:  Portable chest yesterday at 2:55 p.m. FINDINGS: 5:21 Cammi Consalvo.m. Stable cardiomegaly, aortic tortuosity and ectasia, aortic atherosclerosis. Central vessels are normal caliber. Widespread interstitial and patchy airspace opacities of the lung fields continue to be seen, slightly improved in general. No pleural effusion is evident. Osteopenia. IMPRESSION: 1.  Slight improvement in widespread interstitial and patchy airspace opacities. 2. Stable cardiomegaly. 3. Aortic atherosclerosis. 4. No new or worsening findings. Electronically Signed   By: Almira Bar M.D.   On: 12/02/2022 07:44   DG Chest Port 1 View  Result Date: 12/01/2022 CLINICAL DATA:  Pneumonia EXAM: PORTABLE CHEST 1 VIEW COMPARISON:  Chest x-ray 12/01/2022 FINDINGS: Diffuse interstitial opacities in both lungs and multifocal airspace disease bilaterally, right greater left, has not significantly changed. The cardiomediastinal silhouette is within normal limits and stable. There is no pneumothorax. Osseous structures are stable as well. IMPRESSION: No significant interval change in diffuse interstitial opacities and multifocal airspace disease bilaterally, right greater left. Electronically Signed   By: Darliss Cheney M.D.   On: 12/01/2022 16:16   ECHOCARDIOGRAM COMPLETE  Result Date: 12/01/2022    ECHOCARDIOGRAM REPORT   Patient Name:   LUARA FAYE Date of Exam: 12/01/2022 Medical Rec #:  [id number]       Height:       60.0 in Accession #:    [id number]      Weight:       195.8 lb Date of Birth:  August 23, 1951       BSA:          1.850 m Patient Age:    71 years        BP:           107/66 mmHg Patient Gender: F               HR:           87 bpm. Exam Location:  Inpatient Procedure: 2D Echo, Color Doppler, Cardiac Doppler and Intracardiac            Opacification Agent Indications:    R06.9 DOE  History:        Patient has prior history of Echocardiogram examinations, most                 recent 05/15/2022. CHF, COPD, Arrythmias:Atrial Fibrillation;                 Risk Factors:Hypertension, Diabetes, Dyslipidemia and Sleep                 Apnea.  Sonographer:    Irving Burton Senior RDCS Referring Phys: Sumeya Yontz CALDWELL POWELL JR  Sonographer Comments: Very poor echo windows due to patient body habitus and significant lung interference from active pneumonia IMPRESSIONS  1. Left ventricular ejection fraction, by  estimation, is 60 to 65%. The left ventricle has normal function. The left ventricle has no regional wall motion abnormalities. Left ventricular diastolic parameters are consistent with Grade I diastolic dysfunction (impaired relaxation). Elevated left atrial pressure.  2. Right ventricular systolic function was not well visualized. The right ventricular size is not well visualized. Tricuspid regurgitation signal  is inadequate for assessing PA pressure.  3. The mitral valve was not well visualized. No evidence of mitral valve regurgitation. No evidence of mitral stenosis.  4. The aortic valve was not well visualized. There is mild calcification of the aortic valve. Aortic valve regurgitation is not visualized. Aortic valve sclerosis/calcification is present, without any evidence of aortic stenosis.  5. The inferior vena cava is normal in size with greater than 50% respiratory variability, suggesting right atrial pressure of 3 mmHg. Comparison(s): No significant change from prior study. Prior images reviewed side by side. FINDINGS  Left Ventricle: Left ventricular ejection fraction, by estimation, is 60 to 65%. The left ventricle has normal function. The left ventricle has no regional wall motion abnormalities. Definity contrast agent was given IV to delineate the left ventricular  endocardial borders. The left ventricular internal cavity size was normal in size. Suboptimal image quality limits for assessment of left ventricular hypertrophy. Left ventricular diastolic parameters are consistent with Grade I diastolic dysfunction (impaired relaxation). Elevated left atrial pressure. Right Ventricle: The right ventricular size is not well visualized. Right vetricular wall thickness was not well visualized. Right ventricular systolic function was not well visualized. Tricuspid regurgitation signal is inadequate for assessing PA pressure. Left Atrium: Left atrial size was normal in size. Right Atrium: Right atrial size  was not well visualized. Pericardium: There is no evidence of pericardial effusion. Presence of epicardial fat layer. Mitral Valve: The mitral valve was not well visualized. Mild to moderate mitral annular calcification. No evidence of mitral valve regurgitation. No evidence of mitral valve stenosis. Tricuspid Valve: The tricuspid valve is not well visualized. Tricuspid valve regurgitation is not demonstrated. No evidence of tricuspid stenosis. Aortic Valve: The aortic valve was not well visualized. There is mild calcification of the aortic valve. Aortic valve regurgitation is not visualized. Aortic valve sclerosis/calcification is present, without any evidence of aortic stenosis. Pulmonic Valve: The pulmonic valve was not well visualized. Pulmonic valve regurgitation is not visualized. No evidence of pulmonic stenosis. Aorta: The aortic root is normal in size and structure and the ascending aorta was not well visualized. Venous: The inferior vena cava is normal in size with greater than 50% respiratory variability, suggesting right atrial pressure of 3 mmHg. IAS/Shunts: The interatrial septum was not well visualized.  LEFT VENTRICLE PLAX 2D LVOT diam:     2.00 cm   Diastology LV SV:         61        LV e' medial:    3.70 cm/s LV SV Index:   33        LV E/e' medial:  15.8 LVOT Area:     3.14 cm  LV e' lateral:   4.79 cm/s                          LV E/e' lateral: 12.2  LEFT ATRIUM             Index LA Vol (A2C):   36.6 ml 19.79 ml/m LA Vol (A4C):   19.6 ml 10.60 ml/m LA Biplane Vol: 26.8 ml 14.49 ml/m  AORTIC VALVE LVOT Vmax:   94.80 cm/s LVOT Vmean:  71.400 cm/s LVOT VTI:    0.193 m  AORTA Ao Root diam: 3.20 cm Ao Asc diam:  3.50 cm MITRAL VALVE MV Area (PHT): 2.75 cm     SHUNTS MV Decel Time: 276 msec     Systemic VTI:  0.19 m MV E  velocity: 58.40 cm/s   Systemic Diam: 2.00 cm MV Danne Vasek velocity: 125.00 cm/s MV E/Sherill Wegener ratio:  0.47 Mihai Croitoru MD Electronically signed by Thurmon Fair MD Signature Date/Time:  12/01/2022/12:18:13 PM    Final    DG Chest Port 1 View  Result Date: 12/01/2022 CLINICAL DATA:  72 year old female with Jessey Stehlin history of pulmonary fibrosis. "Pneumonia, pneumothorax". EXAM: PORTABLE CHEST 1 VIEW COMPARISON:  Chest CTA 11/28/2022 and earlier. FINDINGS: Portable AP semi upright view at 0632 hours. The patient is more rotated to the right. Lower lung volumes. Background pulmonary fibrosis. Stable cardiac size and mediastinal contours. No pneumothorax is evident. No definite pleural effusion or new pulmonary opacity. Visualized tracheal air column is within normal limits. Stable visualized osseous structures. IMPRESSION: Rotated portable view. Pulmonary Fibrosis with no definite acute cardiopulmonary abnormality. Electronically Signed   By: Odessa Fleming M.D.   On: 12/01/2022 07:19        Scheduled Meds:  atorvastatin  40 mg Oral Daily   brimonidine  1 drop Both Eyes BID   diltiazem  240 mg Oral Daily   dofetilide  500 mcg Oral BID   furosemide  40 mg Intravenous Q12H   insulin aspart  0-15 Units Subcutaneous TID WC   insulin aspart  0-5 Units Subcutaneous QHS   insulin detemir  5 Units Subcutaneous BID   irbesartan  300 mg Oral Daily   methylPREDNISolone (SOLU-MEDROL) injection  40 mg Intravenous Q12H   Netarsudil-Latanoprost  1 drop Both Eyes QHS   sodium chloride flush  3 mL Intravenous Q12H   Continuous Infusions:  cefTRIAXone (ROCEPHIN)  IV 2 g (12/01/22 2322)   doxycycline (VIBRAMYCIN) IV 100 mg (12/01/22 2100)     LOS: 4 days    Time spent: over 30 min - 40 min critical care time with ahrf, worsening needing 15 L HFNC    Lacretia Nicks, MD [facility]   To contact the attending provider between 7A-7P or the covering provider during after hours 7P-7A, please log into the web site [email or web address] and access using universal [place] password for that web site. If you do not have the password, please call the hospital operator.  12/02/2022, 8:51 AM

## 2022-12-03 ENCOUNTER — Other Ambulatory Visit: Payer: Self-pay

## 2022-12-03 DIAGNOSIS — J9621 Acute and chronic respiratory failure with hypoxia: Secondary | ICD-10-CM | POA: Diagnosis not present

## 2022-12-03 DIAGNOSIS — J189 Pneumonia, unspecified organism: Secondary | ICD-10-CM | POA: Diagnosis not present

## 2022-12-03 LAB — CBC WITH DIFFERENTIAL/PLATELET
Abs Immature Granulocytes: 0.09 10*3/uL — ABNORMAL HIGH (ref 0.00–0.07)
Basophils Absolute: 0 10*3/uL (ref 0.0–0.1)
Basophils Relative: 0 %
Eosinophils Absolute: 0 10*3/uL (ref 0.0–0.5)
Eosinophils Relative: 0 %
HCT: 42.3 % (ref 36.0–46.0)
Hemoglobin: 14 g/dL (ref 12.0–15.0)
Immature Granulocytes: 1 %
Lymphocytes Relative: 5 %
Lymphs Abs: 0.6 10*3/uL — ABNORMAL LOW (ref 0.7–4.0)
MCH: 29.7 pg (ref 26.0–34.0)
MCHC: 33.1 g/dL (ref 30.0–36.0)
MCV: 89.6 fL (ref 80.0–100.0)
Monocytes Absolute: 0.9 10*3/uL (ref 0.1–1.0)
Monocytes Relative: 7 %
Neutro Abs: 10.4 10*3/uL — ABNORMAL HIGH (ref 1.7–7.7)
Neutrophils Relative %: 87 %
Platelets: 221 10*3/uL (ref 150–400)
RBC: 4.72 MIL/uL (ref 3.87–5.11)
RDW: 13.9 % (ref 11.5–15.5)
WBC: 12 10*3/uL — ABNORMAL HIGH (ref 4.0–10.5)
nRBC: 0 % (ref 0.0–0.2)

## 2022-12-03 LAB — COMPREHENSIVE METABOLIC PANEL
ALT: 22 U/L (ref 0–44)
AST: 22 U/L (ref 15–41)
Albumin: 2.6 g/dL — ABNORMAL LOW (ref 3.5–5.0)
Alkaline Phosphatase: 80 U/L (ref 38–126)
Anion gap: 12 (ref 5–15)
BUN: 41 mg/dL — ABNORMAL HIGH (ref 8–23)
CO2: 27 mmol/L (ref 22–32)
Calcium: 8.7 mg/dL — ABNORMAL LOW (ref 8.9–10.3)
Chloride: 100 mmol/L (ref 98–111)
Creatinine, Ser: 0.96 mg/dL (ref 0.44–1.00)
GFR, Estimated: >60 mL/min (ref >60)
Glucose, Bld: 200 mg/dL — ABNORMAL HIGH (ref 70–99)
Potassium: 3.2 mmol/L — ABNORMAL LOW (ref 3.5–5.1)
Sodium: 139 mmol/L (ref 135–145)
Total Bilirubin: 0.6 mg/dL (ref 0.3–1.2)
Total Protein: 6.4 g/dL — ABNORMAL LOW (ref 6.5–8.1)

## 2022-12-03 LAB — CULTURE, BLOOD (ROUTINE X 2)
Culture: NO GROWTH
Culture: NO GROWTH

## 2022-12-03 LAB — PHOSPHORUS: Phosphorus: 4.1 mg/dL (ref 2.5–4.6)

## 2022-12-03 LAB — GLUCOSE, CAPILLARY
Glucose-Capillary: 140 mg/dL — ABNORMAL HIGH (ref 70–99)
Glucose-Capillary: 193 mg/dL — ABNORMAL HIGH (ref 70–99)
Glucose-Capillary: 155 mg/dL — ABNORMAL HIGH (ref 70–99)
Glucose-Capillary: 170 mg/dL — ABNORMAL HIGH (ref 70–99)

## 2022-12-03 LAB — MAGNESIUM: Magnesium: 2 mg/dL (ref 1.7–2.4)

## 2022-12-03 MED ORDER — ORAL CARE MOUTH RINSE: 15.0000 mL | OROMUCOSAL | Status: DC | PRN

## 2022-12-03 MED ORDER — PREDNISONE 20 MG PO TABS
20.0000 mg | ORAL_TABLET | Freq: Every day | ORAL | Status: DC
  Administered 2022-12-10 – 2022-12-12 (×3): 20 mg via ORAL
  Filled 2022-12-03 (×4): qty 1

## 2022-12-03 MED ORDER — PREDNISONE 20 MG PO TABS
40.0000 mg | ORAL_TABLET | Freq: Every day | ORAL | Status: AC
  Administered 2022-12-04 – 2022-12-09 (×6): 40 mg via ORAL
  Filled 2022-12-03 (×6): qty 2

## 2022-12-03 MED ORDER — APIXABAN 5 MG PO TABS
5.0000 mg | ORAL_TABLET | Freq: Two times a day (BID) | ORAL | Status: DC
  Administered 2022-12-03 – 2022-12-12 (×18): 5 mg via ORAL
  Filled 2022-12-03 (×18): qty 1

## 2022-12-03 MED ORDER — SODIUM CHLORIDE 0.9 % IV SOLN: INTRAVENOUS | Status: DC | PRN

## 2022-12-03 MED ORDER — POTASSIUM CHLORIDE CRYS ER 20 MEQ PO TBCR
40.0000 meq | EXTENDED_RELEASE_TABLET | ORAL | Status: AC
  Administered 2022-12-03 (×2): 40 meq via ORAL
  Filled 2022-12-03 (×2): qty 2

## 2022-12-03 NOTE — Progress Notes (Signed)
Pt refused BiPAP dreamstation for the evening. RT informed pt that if she changes her mind to contact RT. RT will cont to monitor as needed.

## 2022-12-03 NOTE — Progress Notes (Signed)
PROGRESS NOTE    [name]  ZOX:[id number] DOB: Jul 19, 1951 DOA: 11/28/2022 PCP: Johny BlamerHarris, William, MD  Chief Complaint  Patient presents with   Shortness of Breath    Brief Narrative:   [name] is [name] pleasant 72 y.o. female with medical history significant for atrial fibrillation on Eliquis, pulmonary fibrosis with chronic hypoxic respiratory failure, hypertension, anxiety, and chronic pain who presents to the emergency department with cough, shortness of breath, and fevers.   She's being treated for pneumonia.  Pulmonology c/s.  LTAC consult pending given expectation for slow recovery.  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 Sepsis due to Community Acquired Pneumonia  acute on chronic hypoxic respiratory failure pulmonary fibrosis  - on 2-4 L at baseline - currently 15 L HHFNC, 70% FiO2 satting in low 90's, mildly tachypneic  - CXR 4/8 with slight improvement in widespread interstitial and patchy airspace opacities - meets sepsis criteria with fever, tachypnea, tachycardia, pna  - CT chest with diffuse patchy GGO superimposed on [name] background of mild pulm fibrosis - negative covid, flu, RSV - follow RVP - negative, sputum cx, MRSA, urine strep (negative), urine legionella (pending) - steroid taper per pulm.  S/p 5 days ceftriaxone/doxycycline.   - diurese as tolerated, follow echo (EF 60-65%, no RWMA, grade 1 diastolic dysfunction) - increased to q12 per pulm  - appreciate pulm recs   # Asymptomatic Bacteruria  - proteus on urine culture, no complaints of sx - ceftriaxone should be adequate  # T2DM A1c 6.6 Ssi, follow  Will add some  low dose basal and follow   2. OSA  - Continue BiPAP qHS     3. Atrial fibrillation  - resume Eliquis, Tikosyn, and diltiazem     4. Hypertension  - Continue diltiazem and irbesartan     5. Chronic pain; anxiety  - Prescription database reviewed, plan to continue Norco and Ativan as at home     6. Small Volume Hemoptysis  - Scant, no central PE on CTA chest  - Likely d/t infection, continue antibiotics as above, eliquis on hold -> will resume and follow closely   7. Thoracic aorta ectasia  - Noted on CTA in ED  - Continue CT surgery follow-up as planned   Glaucoma - eye drops     DVT prophylaxis: eliquis on hold, SCD Code Status: full Family Communication: none Disposition:   Status is: Inpatient Remains inpatient appropriate because: continued hypoxia    Consultants:  pulmonology  Procedures:  Echo IMPRESSIONS     1. Left ventricular ejection fraction, by estimation, is 60 to 65%. The  left ventricle has normal function. The left ventricle has no regional  wall motion abnormalities. Left ventricular diastolic parameters are  consistent with Grade I diastolic  dysfunction (impaired relaxation). Elevated left atrial pressure.   2. Right ventricular systolic function was not well visualized. The right  ventricular size is not well visualized. Tricuspid regurgitation signal is  inadequate for assessing PA pressure.   3. The mitral valve was not well visualized. No evidence of mitral valve  regurgitation. No evidence of mitral stenosis.   4. The aortic  valve was not well visualized. There is mild calcification  of the aortic valve. Aortic valve regurgitation is not visualized. Aortic  valve sclerosis/calcification is present, without any evidence of aortic  stenosis.   5. The inferior vena cava is normal in size with greater than 50%  respiratory variability, suggesting right atrial pressure of 3 mmHg.   Comparison(s): No significant change from prior study.  Prior images  reviewed side by side.   Antimicrobials:  Anti-infectives (From admission, onward)    Start     Dose/Rate Route Frequency Ordered Stop   11/29/22 1800  vancomycin (VANCOREADY) IVPB 750 mg/150 mL  Status:  Discontinued        750 mg 150 mL/hr over 60 Minutes Intravenous Every 24 hours 11/28/22 1844 11/28/22 2031   11/29/22 0600  ceFEPIme (MAXIPIME) 2 g in sodium chloride 0.9 % 100 mL IVPB  Status:  Discontinued        2 g 200 mL/hr over 30 Minutes Intravenous Every 12 hours 11/28/22 1844 11/28/22 2031   11/28/22 2300  cefTRIAXone (ROCEPHIN) 2 g in sodium chloride 0.9 % 100 mL IVPB        2 g 200 mL/hr over 30 Minutes Intravenous Every 24 hours 11/28/22 2031 12/03/22 0700   11/28/22 2200  doxycycline (VIBRAMYCIN) 100 mg in sodium chloride 0.9 % 250 mL IVPB        100 mg 125 mL/hr over 120 Minutes Intravenous Every 12 hours 11/28/22 2048 12/03/22 1121   11/28/22 2045  azithromycin (ZITHROMAX) 500 mg in sodium chloride 0.9 % 250 mL IVPB  Status:  Discontinued        500 mg 250 mL/hr over 60 Minutes Intravenous Every 24 hours 11/28/22 2031 11/28/22 2048   11/28/22 1600  ceFEPIme (MAXIPIME) 2 g in sodium chloride 0.9 % 100 mL IVPB        2 g 200 mL/hr over 30 Minutes Intravenous  Once 11/28/22 1547 11/28/22 1806   11/28/22 1600  metroNIDAZOLE (FLAGYL) IVPB 500 mg        500 mg 100 mL/hr over 60 Minutes Intravenous  Once 11/28/22 1547 11/28/22 1723   11/28/22 1600  vancomycin (VANCOCIN) IVPB 1000 mg/200 mL premix  Status:  Discontinued        1,000 mg 200 mL/hr over 60 Minutes Intravenous  Once 11/28/22 1547 11/28/22 1550   11/28/22 1600  vancomycin (VANCOREADY) IVPB 1750 mg/350 mL        1,750 mg 175 mL/hr over 120 Minutes Intravenous  Once 11/28/22 1550 11/28/22 2212       Subjective: No new complaints SOB, but mild improvement  Objective: Vitals:   12/03/22 1000 12/03/22 1100 12/03/22 1200 12/03/22 1300  BP:  123/66    Pulse: 69 64 67 65  Resp: (!) 22 (!) 21  (!) 24 (!) 24  Temp:  97.6 F (36.4 C)    TempSrc:      SpO2: 93% 91% 91% 100%  Weight:      Height:        Intake/Output Summary (Last 24 hours) at 12/03/2022 1337 Last data filed at 12/03/2022 1300 Gross per 24 hour  Intake 1380.2 ml  Output 1700 ml  Net -319.8 ml   Filed Weights   11/29/22 0420 11/30/22 0500 12/02/22 0500  Weight: 88.7 kg 88.8 kg 88.1 kg    Examination:  General: No acute distress. Cardiovascular: RRR Lungs: coarse breath sounds, diminished on exam Abdomen: Soft, nontender, nondistended Neurological: Alert and oriented 3. Moves all  extremities 4 with equal strength. Cranial nerves II through XII grossly intact. Extremities: No clubbing or cyanosis. No edema.   Data Reviewed: I have personally reviewed following labs and imaging studies  CBC: Recent Labs  Lab 11/28/22 1431 11/29/22 0053 11/30/22 0649 12/01/22 0357 12/02/22 0317 12/03/22 0423  WBC 10.6* 11.3* 19.0* 17.2* 12.8* 12.0*  NEUTROABS 8.4*  --   --   --  11.6* 10.4*  HGB 14.2 12.7 13.3 13.6 12.9 14.0  HCT 43.3 38.1 41.5 41.3 40.5 42.3  MCV 90.2 90.1 91.6 91.0 91.6 89.6  PLT 208 189 211 234 227 221    Basic Metabolic Panel: Recent Labs  Lab 11/29/22 0053 11/30/22 0649 12/01/22 0357 12/02/22 0317 12/03/22 0423  NA 140 141 138 141 139  K 3.9 3.9 4.3 4.1 3.2*  CL 107 104 105 102 100  CO2 24 24 25 29 27   GLUCOSE 196* 149* 227* 178* 200*  BUN 17 32* 41* 37* 41*  CREATININE 0.80 0.95 1.13* 0.97 0.96  CALCIUM 9.0 9.4 9.0 8.6* 8.7*  MG 2.3  --   --  2.0 2.0  PHOS  --   --   --  4.0 4.1    GFR: Estimated Creatinine Clearance: 52.3 mL/min (by C-G formula based on SCr of 0.96 mg/dL).  Liver Function Tests: Recent Labs  Lab 11/28/22 1431 12/03/22 0423  AST 21 22  ALT 15 22  ALKPHOS 100 80  BILITOT 0.8 0.6  PROT 8.1 6.4*  ALBUMIN 3.5 2.6*    CBG: Recent Labs  Lab 12/02/22 1200 12/02/22 1618 12/02/22 2105 12/03/22 0752 12/03/22 1313  GLUCAP 246* 114* 249* 140* 193*      Recent Results (from the past 240 hour(s))  Urine Culture     Status: Abnormal   Collection Time: 11/28/22  2:31 PM   Specimen: Urine, Clean Catch  Result Value Ref Range Status   Specimen Description URINE, CLEAN CATCH  Final   Special Requests   Final    NONE Reflexed from [id number] Performed at [facility], [address], [place], [place] [address]    Culture >=100,000 COLONIES/mL PROTEUS MIRABILIS (Eulah Walkup)  Final   Report Status 11/30/2022 FINAL  Final   Organism ID, Bacteria PROTEUS MIRABILIS (Derry Arbogast)  Final      Susceptibility   Proteus mirabilis - MIC*    AMPICILLIN <=2 SENSITIVE Sensitive     CEFAZOLIN <=4 SENSITIVE Sensitive     CEFEPIME <=0.12 SENSITIVE Sensitive     CEFTRIAXONE <=0.25 SENSITIVE Sensitive     CIPROFLOXACIN <=0.25 SENSITIVE Sensitive     GENTAMICIN <=1 SENSITIVE Sensitive     IMIPENEM 2 SENSITIVE Sensitive     NITROFURANTOIN 128 RESISTANT Resistant     TRIMETH/SULFA <=20 SENSITIVE Sensitive     AMPICILLIN/SULBACTAM <=2 SENSITIVE Sensitive     PIP/TAZO <=4 SENSITIVE Sensitive     * >=100,000 COLONIES/mL PROTEUS MIRABILIS  Resp panel by RT-PCR (RSV, Flu Shawneequa Baldridge&B, Covid)     Status: None   Collection Time: 11/28/22  2:32 PM   Specimen: Nasal Swab  Result Value Ref Range Status   SARS Coronavirus 2 by RT PCR NEGATIVE NEGATIVE Final   Influenza Corean Yoshimura by PCR NEGATIVE NEGATIVE Final   Influenza B by PCR NEGATIVE NEGATIVE Final    Comment: (NOTE) The Xpert Xpress SARS-CoV-2/FLU/RSV plus assay is intended as an aid in the diagnosis of influenza from Nasopharyngeal swab specimens and should not be used as Ondria Oswald sole basis for treatment. Nasal washings and aspirates  are unacceptable for Xpert Xpress SARS-CoV-2/FLU/RSV testing.  Fact Sheet for Patients: BloggerCourse.com  Fact Sheet for Healthcare Providers: SeriousBroker.it  This test is not yet approved or cleared by the [place] FDA and has been authorized  for detection and/or diagnosis of SARS-CoV-2 by FDA under an Emergency Use Authorization (EUA). This EUA will remain in effect (meaning this test can be used) for the duration of the COVID-19 declaration under Section 564(b)(1) of the Act, 21 U.S.C. section 360bbb-3(b)(1), unless the authorization is terminated or revoked.     Resp Syncytial Virus by PCR NEGATIVE NEGATIVE Final    Comment: (NOTE) Fact Sheet for Patients: BloggerCourse.com  Fact Sheet for Healthcare Providers: SeriousBroker.it  This test is not yet approved or cleared by the [place] FDA and has been authorized for detection and/or diagnosis of SARS-CoV-2 by FDA under an Emergency Use Authorization (EUA). This EUA will remain in effect (meaning this test can be used) for the duration of the COVID-19 declaration under Section 564(b)(1) of the Act, 21 U.S.C. section 360bbb-3(b)(1), unless the authorization is terminated or revoked.  Performed at [facility], [address] [place], [place] [address]   Blood Culture (routine x 2)     Status: None   Collection Time: 11/28/22  2:36 PM   Specimen: BLOOD  Result Value Ref Range Status   Specimen Description BLOOD RIGHT ANTECUBITAL  Final   Special Requests   Final    BOTTLES DRAWN AEROBIC AND ANAEROBIC Blood Culture results may not be optimal due to an excessive volume of blood received in culture bottles   Culture   Final    NO GROWTH 5 DAYS Performed at [facility], [address] [place], [place] [address]    Report Status 12/03/2022 FINAL  Final  Blood Culture (routine x 2)     Status: None   Collection Time: 11/28/22  3:28 PM   Specimen: BLOOD  Result Value Ref Range Status   Specimen Description BLOOD LEFT ANTECUBITAL  Final   Special Requests   Final    BOTTLES DRAWN AEROBIC AND ANAEROBIC Blood Culture results may not be optimal due to an inadequate volume of blood received in culture  bottles   Culture   Final    NO GROWTH 5 DAYS Performed at [facility], [address], [place], [place] [address]    Report Status 12/03/2022 FINAL  Final  Respiratory (~20 pathogens) panel by PCR     Status: None   Collection Time: 11/30/22  9:47 AM   Specimen: Nasopharyngeal Swab; Respiratory  Result Value Ref Range Status   Adenovirus NOT DETECTED NOT DETECTED Final   Coronavirus 229E NOT DETECTED NOT DETECTED Final    Comment: (NOTE) The Coronavirus on the Respiratory Panel, DOES NOT test for the novel  Coronavirus (2019 nCoV)    Coronavirus HKU1 NOT DETECTED NOT DETECTED Final   Coronavirus NL63 NOT DETECTED NOT DETECTED Final   Coronavirus OC43 NOT DETECTED NOT DETECTED Final   Metapneumovirus NOT DETECTED NOT DETECTED Final   Rhinovirus / Enterovirus NOT DETECTED NOT DETECTED Final   Influenza Jermale Crass NOT DETECTED NOT DETECTED Final   Influenza B NOT DETECTED NOT DETECTED Final   Parainfluenza Virus 1 NOT DETECTED NOT DETECTED Final   Parainfluenza Virus 2 NOT DETECTED NOT DETECTED Final   Parainfluenza Virus 3 NOT DETECTED NOT DETECTED Final   Parainfluenza Virus 4 NOT DETECTED NOT DETECTED Final   Respiratory Syncytial Virus NOT DETECTED NOT DETECTED  Final   Bordetella pertussis NOT DETECTED NOT DETECTED Final   Bordetella Parapertussis NOT DETECTED NOT DETECTED Final   Chlamydophila pneumoniae NOT DETECTED NOT DETECTED Final   Mycoplasma pneumoniae NOT DETECTED NOT DETECTED Final    Comment: Performed at [facility], [address] [place], [place] [address]         Radiology Studies: DG Chest Port 1 View  Result Date: 12/02/2022 CLINICAL DATA:  [id number].  Pneumonia follow-up. EXAM: PORTABLE CHEST 1 VIEW COMPARISON:  Portable chest yesterday at 2:55 p.m. FINDINGS: 5:21 Amirr Achord.m. Stable cardiomegaly, aortic tortuosity and ectasia, aortic atherosclerosis. Central vessels are normal caliber. Widespread interstitial and patchy airspace opacities of the lung fields  continue to be seen, slightly improved in general. No pleural effusion is evident. Osteopenia. IMPRESSION: 1. Slight improvement in widespread interstitial and patchy airspace opacities. 2. Stable cardiomegaly. 3. Aortic atherosclerosis. 4. No new or worsening findings. Electronically Signed   By: Almira Bar M.D.   On: 12/02/2022 07:44   DG Chest Port 1 View  Result Date: 12/01/2022 CLINICAL DATA:  Pneumonia EXAM: PORTABLE CHEST 1 VIEW COMPARISON:  Chest x-ray 12/01/2022 FINDINGS: Diffuse interstitial opacities in both lungs and multifocal airspace disease bilaterally, right greater left, has not significantly changed. The cardiomediastinal silhouette is within normal limits and stable. There is no pneumothorax. Osseous structures are stable as well. IMPRESSION: No significant interval change in diffuse interstitial opacities and multifocal airspace disease bilaterally, right greater left. Electronically Signed   By: Darliss Cheney M.D.   On: 12/01/2022 16:16        Scheduled Meds:  atorvastatin  40 mg Oral Daily   brimonidine  1 drop Both Eyes BID   diltiazem  240 mg Oral Daily   dofetilide  500 mcg Oral BID   furosemide  40 mg Intravenous Q12H   insulin aspart  0-15 Units Subcutaneous TID WC   insulin aspart  0-5 Units Subcutaneous QHS   insulin detemir  5 Units Subcutaneous BID   irbesartan  300 mg Oral Daily   Netarsudil-Latanoprost  1 drop Both Eyes QHS   predniSONE  40 mg Oral Q breakfast   sodium chloride flush  3 mL Intravenous Q12H   Continuous Infusions:  sodium chloride       LOS: 5 days    Time spent: over 30 min - 40 min critical care time with ahrf, worsening needing 15 L HFNC    Lacretia Nicks, MD [facility]   To contact the attending provider between 7A-7P or the covering provider during after hours 7P-7A, please log into the web site [email or web address] and access using universal [place] password for that web site. If you do not have the password,  please call the hospital operator.  12/03/2022, 1:37 PM

## 2022-12-03 NOTE — Progress Notes (Signed)
   NAME:  [name], MRN:  [id number], DOB:  [birth year], LOS: 5 ADMISSION DATE:  11/28/2022, CONSULTATION DATE: 11/29/2022 REFERRING MD: [facility], CHIEF COMPLAINT: Acute on chronic hypoxic respiratory with suspected pneumonia  History of Present Illness:  72 year old female who is chronically O2 dependent at 2 to 3 L nasal cannula daily basis.  She notes fever purulent sputum with blood-tinged dyspnea on exertion and increasing O2 needs while at home.  CT scan is negative for PE but does show nonspecific pulmonary fibrosis, groundglass opacity.  Pulmonary critical care asked to evaluate.  She is followed by Dr. Sandrea Hughs of pulmonary.  Pertinent  Medical History   Past Medical History:  Diagnosis Date   Chronic diastolic CHF (congestive heart failure)    Chronic respiratory failure    Coronary atherosclerosis    a. 2V by CT 10/2017.   Emphysema of lung    Hypercholesterolemia    Hypertension    NSIP (nonspecific interstitial pneumonia)    Osteoarthritis    Persistent atrial fibrillation    Thoracic aortic ectasia      Significant Hospital Events: Including procedures, antibiotic start and stop dates in addition to other pertinent events   4/8 Seen sitting up in bedside recline on 30L HHFNC  Interim History / Subjective:  No events, slowly improving.   Objective   Blood pressure 123/66, pulse 65, temperature 97.6 F (36.4 C), resp. rate (!) 24, height 5' (1.524 m), weight 88.1 kg, SpO2 100 %.    FiO2 (%):  [72 %-80 %] 75 %   Intake/Output Summary (Last 24 hours) at 12/03/2022 1329 Last data filed at 12/03/2022 1300 Gross per 24 hour  Intake 1380.2 ml  Output 1700 ml  Net -319.8 ml    Filed Weights   11/29/22 0420 11/30/22 0500 12/02/22 0500  Weight: 88.7 kg 88.8 kg 88.1 kg    Examination: No distress Dry crackles bases Ext warm Aox3 Global weakness 25LPM 70% FiO2  Resolved Hospital Problem list     Assessment & Plan:  Acute on chronic hypoxic respiratory  failure 2/2  -Primary pulmonologist Dr. Sherene Sires, normally on 2-3 liters CAP vs ILD flair (h/o NSIP)  c/b pulmonary edema and hemoptysis  Leukocytosis - improved  H/o obstructive sleep apnea   -RVP neg, PCT reassuring  P: Push diuresis, steroids, abx as ordered (made steroids a taper) Wean O2 for sats >90% Will follow peripherally, not much we are adding day to day; when closer to [place] can arrange f/u  Myrla Halsted MD P[facility]M [facility] Personal contact information can be found on Amion  If no contact or response made please call [phone] 12/03/2022, 1:29 PM

## 2022-12-04 ENCOUNTER — Other Ambulatory Visit: Payer: Self-pay | Admitting: Thoracic Surgery (Cardiothoracic Vascular Surgery)

## 2022-12-04 DIAGNOSIS — J189 Pneumonia, unspecified organism: Secondary | ICD-10-CM | POA: Diagnosis not present

## 2022-12-04 DIAGNOSIS — J9621 Acute and chronic respiratory failure with hypoxia: Secondary | ICD-10-CM | POA: Diagnosis not present

## 2022-12-04 DIAGNOSIS — G894 Chronic pain syndrome: Secondary | ICD-10-CM | POA: Diagnosis not present

## 2022-12-04 DIAGNOSIS — I5032 Chronic diastolic (congestive) heart failure: Secondary | ICD-10-CM | POA: Diagnosis not present

## 2022-12-04 DIAGNOSIS — I712 Thoracic aortic aneurysm, without rupture, unspecified: Secondary | ICD-10-CM

## 2022-12-04 LAB — COMPREHENSIVE METABOLIC PANEL
ALT: 34 U/L (ref 0–44)
AST: 28 U/L (ref 15–41)
Albumin: 2.6 g/dL — ABNORMAL LOW (ref 3.5–5.0)
Alkaline Phosphatase: 70 U/L (ref 38–126)
Anion gap: 9 (ref 5–15)
BUN: 43 mg/dL — ABNORMAL HIGH (ref 8–23)
CO2: 30 mmol/L (ref 22–32)
Calcium: 9 mg/dL (ref 8.9–10.3)
Chloride: 100 mmol/L (ref 98–111)
Creatinine, Ser: 0.95 mg/dL (ref 0.44–1.00)
GFR, Estimated: >60 mL/min (ref >60)
Glucose, Bld: 137 mg/dL — ABNORMAL HIGH (ref 70–99)
Potassium: 3.9 mmol/L (ref 3.5–5.1)
Sodium: 139 mmol/L (ref 135–145)
Total Bilirubin: 1.1 mg/dL (ref 0.3–1.2)
Total Protein: 6 g/dL — ABNORMAL LOW (ref 6.5–8.1)

## 2022-12-04 LAB — CBC WITH DIFFERENTIAL/PLATELET
Abs Immature Granulocytes: 0.09 10*3/uL — ABNORMAL HIGH (ref 0.00–0.07)
Basophils Absolute: 0 10*3/uL (ref 0.0–0.1)
Basophils Relative: 0 %
Eosinophils Absolute: 0 10*3/uL (ref 0.0–0.5)
Eosinophils Relative: 0 %
HCT: 42.7 % (ref 36.0–46.0)
Hemoglobin: 14.2 g/dL (ref 12.0–15.0)
Immature Granulocytes: 1 %
Lymphocytes Relative: 7 %
Lymphs Abs: 0.9 10*3/uL (ref 0.7–4.0)
MCH: 29.7 pg (ref 26.0–34.0)
MCHC: 33.3 g/dL (ref 30.0–36.0)
MCV: 89.3 fL (ref 80.0–100.0)
Monocytes Absolute: 1.4 10*3/uL — ABNORMAL HIGH (ref 0.1–1.0)
Monocytes Relative: 11 %
Neutro Abs: 10.1 10*3/uL — ABNORMAL HIGH (ref 1.7–7.7)
Neutrophils Relative %: 81 %
Platelets: 222 10*3/uL (ref 150–400)
RBC: 4.78 MIL/uL (ref 3.87–5.11)
RDW: 14 % (ref 11.5–15.5)
WBC: 12.5 10*3/uL — ABNORMAL HIGH (ref 4.0–10.5)
nRBC: 0 % (ref 0.0–0.2)

## 2022-12-04 LAB — PHOSPHORUS: Phosphorus: 3.5 mg/dL (ref 2.5–4.6)

## 2022-12-04 LAB — MAGNESIUM: Magnesium: 2.2 mg/dL (ref 1.7–2.4)

## 2022-12-04 LAB — GLUCOSE, CAPILLARY
Glucose-Capillary: 138 mg/dL — ABNORMAL HIGH (ref 70–99)
Glucose-Capillary: 168 mg/dL — ABNORMAL HIGH (ref 70–99)
Glucose-Capillary: 107 mg/dL — ABNORMAL HIGH (ref 70–99)
Glucose-Capillary: 226 mg/dL — ABNORMAL HIGH (ref 70–99)
Glucose-Capillary: 198 mg/dL — ABNORMAL HIGH (ref 70–99)

## 2022-12-04 MED ORDER — POTASSIUM CHLORIDE CRYS ER 20 MEQ PO TBCR
20.0000 meq | EXTENDED_RELEASE_TABLET | Freq: Once | ORAL | Status: AC
  Administered 2022-12-04: 20 meq via ORAL
  Filled 2022-12-04: qty 1

## 2022-12-04 NOTE — TOC Initial Note (Signed)
Transition of Care (TOC) - Initial/Assessment Note  Donn Pierini RN,BSN Transitions of Care Unit 4NP (Non Trauma)- RN Case Manager See Treatment Team for direct Phone #   Patient Details  Name: [name] MRN: [id number] Date of Birth: July 31, 1951  Transition of Care [facility]) CM/SW Contact:    Darrold Span, RN Phone Number: 12/04/2022, 2:35 PM  Clinical Narrative:                 Referral for LTACH noted, CM spoke with pt at bedside discussed LTACH option for ongoing respiratory needs and 02 weaning as well as therapy needs.  Pt voiced she was agreeable to this for transition plan, choice offered for area [facility] options, [facility].  Pt voiced she would like to look into [facility] as first choice.   Pt voiced she has her son/family that live within 10-15 min of her that can assist on discharge, her home 02 baseline is 2L.   Call made to [facility] liaison Alden Server who will f/u on referral, (jennifer liaison also updated). CM will follow to see if pt meets criteria with [facility] and see if they are going to submit for insurance auth. Explained to pt that LTACH would be dependant on meeting criteria, bed offer, and insurance approval- pt voiced understanding.    Expected Discharge Plan: Long Term Acute Care (LTAC) Barriers to Discharge: Continued Medical Work up   Patient Goals and CMS Choice Patient states their goals for this hospitalization and ongoing recovery are:: get better, get stronger and be able to return home CMS Medicare.gov Compare Post Acute Care list provided to:: Patient Choice offered to / list presented to : Patient      Expected Discharge Plan and Services   Discharge Planning Services: CM Consult Post Acute Care Choice: Long Term Acute Care (LTAC) Living arrangements for the past 2 months: Single Family Home                                      Prior Living Arrangements/Services Living arrangements for the past 2 months: Single Family  Home Lives with:: Self Patient language and need for interpreter reviewed:: Yes Do you feel safe going back to the place where you live?: Yes      Need for Family Participation in Patient Care: Yes (Comment) Care giver support system in place?: Yes (comment) Current home services: DME (home 02) Criminal Activity/Legal Involvement Pertinent to Current Situation/Hospitalization: No - Comment as needed  Activities of Daily Living Home Assistive Devices/Equipment: Walker (specify type), Oxygen ADL Screening (condition at time of admission) Patient's cognitive ability adequate to safely complete daily activities?: Yes Is the patient deaf or have difficulty hearing?: No Does the patient have difficulty seeing, even when wearing glasses/contacts?: No Does the patient have difficulty concentrating, remembering, or making decisions?: No Patient able to express need for assistance with ADLs?: Yes Does the patient have difficulty dressing or bathing?: No Independently performs ADLs?: Yes (appropriate for developmental age) Does the patient have difficulty walking or climbing stairs?: No Weakness of Legs: Both Weakness of Arms/Hands: None  Permission Sought/Granted Permission sought to share information with : Facility Industrial/product designer granted to share information with : Yes, Verbal Permission Granted     Permission granted to share info w AGENCY: [facility]        Emotional Assessment Appearance:: Appears stated age Attitude/Demeanor/Rapport: Engaged Affect (typically observed): Accepting, Appropriate  Orientation: : Oriented to Self, Oriented to Place, Oriented to  Time, Oriented to Situation Alcohol / Substance Use: Not Applicable Psych Involvement: No (comment)  Admission diagnosis:  Pneumonia [J18.9] Atypical pneumonia [J18.9] Acute on chronic respiratory failure with hypoxia [J96.21] Patient Active Problem List   Diagnosis Date Noted   Pneumonia 11/28/2022   Acute  on chronic respiratory failure with hypoxia 11/28/2022   Simple chronic bronchitis 10/02/2021   Acute bronchitis 09/25/2020   Age-related osteoporosis without current pathological fracture 09/01/2020   Anxiety disorder 09/01/2020   Chronic kidney disease, stage 3 unspecified 09/01/2020   Chronic pain syndrome 09/01/2020   History of colonic polyps 09/01/2020   History of hyperthyroidism 09/01/2020   Multinodular goiter 09/01/2020   Peripheral venous insufficiency 09/01/2020   Type 2 diabetes mellitus with other specified complication 09/01/2020   Thoracic aortic ectasia 11/20/2017   OSA treated with BiPAP 02/28/2017   Chronic diastolic heart failure 01/01/2017   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 11/08/2016   Chronic respiratory failure with hypoxia and hypercapnia 10/30/2016   Persistent atrial fibrillation    Chronic diastolic CHF (congestive heart failure) 09/05/2016   Acute on chronic diastolic CHF (congestive heart failure) 09/05/2016   Anticoagulated 08/16/2016   Atrial fibrillation with rapid ventricular response 07/23/2016   COPD exacerbation 07/23/2016   Hypercholesterolemia 07/23/2016   Morbid obesity due to excess calories (HCC) complicated by osa/hbp 06/26/2016   Dyspnea on exertion 06/25/2016   Community acquired pneumonia    Acute on chronic diastolic heart failure    PNA (pneumonia) 02/06/2016   Glaucoma 02/06/2016   Acute respiratory failure with hypoxia 02/05/2016   HTN (hypertension) 02/05/2016   HYPERTHYROIDISM 02/25/2008   DEPRESSION, INITIAL EPISODE 02/25/2008   Essential hypertension 02/24/2008   BACK PAIN 02/24/2008   OSTEOPENIA 02/24/2008   SCOLIOSIS 02/24/2008   PCP:  [name], MD Pharmacy:   [facility] - [place], [place] - [address] [place] [address] Phone: [phone] Fax: [phone]     Social Determinants of Health (SDOH) Social History: SDOH  Screenings   Food Insecurity: No Food Insecurity (12/03/2022)  Housing: Low Risk  (12/03/2022)  Transportation Needs: No Transportation Needs (12/03/2022)  Utilities: Not At Risk (12/03/2022)  Tobacco Use: Medium Risk (11/28/2022)   SDOH Interventions:     Readmission Risk Interventions     No data to display

## 2022-12-04 NOTE — Progress Notes (Signed)
[facility]  PROGRESS NOTE  [name] PQD:[id number] DOB: September 03, 1950 DOA: 11/28/2022 PCP: [name], MD   Brief HPI:   72 year old female with history of chronic hypoxemic respiratory failure, pulmonary fibrosis, A-fib on Eliquis, hypertension, anxiety, chronic pain presented with cough, shortness of breath.  CTA chest showed diffuse patchy opacity on background of fibrosis, suspicious for atypical infection.    Patient admitted with sepsis due to community-acquired pneumonia, acute on chronic hypoxemic respiratory failure in the setting of pulmonary fibrosis.    Subjective   Patient seen and examined, denies shortness of breath.  Now requiring 2 L HHFNC   Assessment/Plan:    Sepsis due to community-acquired pneumonia -Presented with fever, tachypnea, tachycardia, pneumonia -Acute on chronic hypoxemic respiratory failure/pulmonary fibrosis; 2 L oxygen at baseline -Currently requiring 12 L HHFNC; 70% FiO2.  COVID, influenza, RSV negative -Respiratory virus panel negative -Steroid taper per pulmonology, s/p 5 days of antibiotics with ceftriaxone/doxycycline -Diuresed with Lasix 40 mg IV every 12 hours, EF 60 to 65% with grade 1 diastolic dysfunction as per echo   Asymptomatic bacteriuria -Proteus mirabilis on urine culture, asymptomatic -Completed antibiotics with ceftriaxone for pneumonia as above  Diabetes mellitus type 2 -Hemoglobin A1c 6.6, continue sliding scale insulin NovoLog -Insulin detemir 5 units subcu twice daily; CBG well-controlled  Atrial fibrillation -Continue Eliquis, Tikosyn, diltiazem -Heart rate controlled  Obstructive sleep apnea -Continue BiPAP nightly  Hypertension -Continue diltiazem, irbesartan  Chronic pain/anxiety -Continue Norco, Ativan  Hemoptysis -Had scant hemoptysis, no PE on CTA chest -Likely in setting of atypical infection pulmonary fibrosis -Eliquis was held, has been resumed  Thoracic aortic ectasia -Noted on  CTA chest in the ED -Follow CT surgery as outpatient as planned     Medications     apixaban  5 mg Oral BID   atorvastatin  40 mg Oral Daily   brimonidine  1 drop Both Eyes BID   diltiazem  240 mg Oral Daily   dofetilide  500 mcg Oral BID   furosemide  40 mg Intravenous Q12H   insulin aspart  0-15 Units Subcutaneous TID WC   insulin aspart  0-5 Units Subcutaneous QHS   insulin detemir  5 Units Subcutaneous BID   irbesartan  300 mg Oral Daily   predniSONE  40 mg Oral Q breakfast   Followed by   Melene Muller ON 12/10/2022] predniSONE  20 mg Oral Q breakfast   sodium chloride flush  3 mL Intravenous Q12H     Data Reviewed:   CBG:  Recent Labs  Lab 12/03/22 1313 12/03/22 1614 12/03/22 2056 12/04/22 0656 12/04/22 0811  GLUCAP 193* 155* 170* 138* 168*    SpO2: 96 % O2 Flow Rate (L/min): 25 L/min FiO2 (%): 70 %    Vitals:   12/03/22 2304 12/04/22 0252 12/04/22 0300 12/04/22 0725  BP: 116/70  118/69 127/71  Pulse: (!) 57 (!) 55 (!) 56 62  Resp: 20 (!) 25 20 20   Temp: 97.7 F (36.5 C)  98.2 F (36.8 C) 98 F (36.7 C)  TempSrc: Oral  Oral Oral  SpO2: 95% 95% 94% 96%  Weight:      Height:          Data Reviewed:  Basic Metabolic Panel: Recent Labs  Lab 11/29/22 0053 11/30/22 0649 12/01/22 0357 12/02/22 0317 12/03/22 0423 12/04/22 0257  NA 140 141 138 141 139 139  K 3.9 3.9 4.3 4.1 3.2* 3.9  CL 107 104 105 102 100 100  CO2 24 24 25  29  27 30  GLUCOSE 196* 149* 227* 178* 200* 137*  BUN 17 32* 41* 37* 41* 43*  CREATININE 0.80 0.95 1.13* 0.97 0.96 0.95  CALCIUM 9.0 9.4 9.0 8.6* 8.7* 9.0  MG 2.3  --   --  2.0 2.0 2.2  PHOS  --   --   --  4.0 4.1 3.5    CBC: Recent Labs  Lab 11/28/22 1431 11/29/22 0053 11/30/22 0649 12/01/22 0357 12/02/22 0317 12/03/22 0423 12/04/22 0257  WBC 10.6*   < > 19.0* 17.2* 12.8* 12.0* 12.5*  NEUTROABS 8.4*  --   --   --  11.6* 10.4* 10.1*  HGB 14.2   < > 13.3 13.6 12.9 14.0 14.2  HCT 43.3   < > 41.5 41.3 40.5 42.3 42.7   MCV 90.2   < > 91.6 91.0 91.6 89.6 89.3  PLT 208   < > 211 234 227 221 222   < > = values in this interval not displayed.    LFT Recent Labs  Lab 11/28/22 1431 12/03/22 0423 12/04/22 0257  AST 21 22 28   ALT 15 22 34  ALKPHOS 100 80 70  BILITOT 0.8 0.6 1.1  PROT 8.1 6.4* 6.0*  ALBUMIN 3.5 2.6* 2.6*     Antibiotics: Anti-infectives (From admission, onward)    Start     Dose/Rate Route Frequency Ordered Stop   11/29/22 1800  vancomycin (VANCOREADY) IVPB 750 mg/150 mL  Status:  Discontinued        750 mg 150 mL/hr over 60 Minutes Intravenous Every 24 hours 11/28/22 1844 11/28/22 2031   11/29/22 0600  ceFEPIme (MAXIPIME) 2 g in sodium chloride 0.9 % 100 mL IVPB  Status:  Discontinued        2 g 200 mL/hr over 30 Minutes Intravenous Every 12 hours 11/28/22 1844 11/28/22 2031   11/28/22 2300  cefTRIAXone (ROCEPHIN) 2 g in sodium chloride 0.9 % 100 mL IVPB        2 g 200 mL/hr over 30 Minutes Intravenous Every 24 hours 11/28/22 2031 12/03/22 0700   11/28/22 2200  doxycycline (VIBRAMYCIN) 100 mg in sodium chloride 0.9 % 250 mL IVPB        100 mg 125 mL/hr over 120 Minutes Intravenous Every 12 hours 11/28/22 2048 12/03/22 1121   11/28/22 2045  azithromycin (ZITHROMAX) 500 mg in sodium chloride 0.9 % 250 mL IVPB  Status:  Discontinued        500 mg 250 mL/hr over 60 Minutes Intravenous Every 24 hours 11/28/22 2031 11/28/22 2048   11/28/22 1600  ceFEPIme (MAXIPIME) 2 g in sodium chloride 0.9 % 100 mL IVPB        2 g 200 mL/hr over 30 Minutes Intravenous  Once 11/28/22 1547 11/28/22 1806   11/28/22 1600  metroNIDAZOLE (FLAGYL) IVPB 500 mg        500 mg 100 mL/hr over 60 Minutes Intravenous  Once 11/28/22 1547 11/28/22 1723   11/28/22 1600  vancomycin (VANCOCIN) IVPB 1000 mg/200 mL premix  Status:  Discontinued        1,000 mg 200 mL/hr over 60 Minutes Intravenous  Once 11/28/22 1547 11/28/22 1550   11/28/22 1600  vancomycin (VANCOREADY) IVPB 1750 mg/350 mL        1,750 mg 175  mL/hr over 120 Minutes Intravenous  Once 11/28/22 1550 11/28/22 2212        DVT prophylaxis: Apixaban  Code Status: Full code  Family Communication:    CONSULTS pulmonology  Objective    Physical Examination:   General: Appears in no acute distress Cardiovascular: S1-S2, regular Respiratory: Bilateral rhonchi auscultated Abdomen: Soft, nontender, no organomegaly Extremities: No edema in the lower extremities Neurologic: Alert, oriented x 3, no focal deficit noted   Status is: Inpatient:             Meredeth Ide   [facility] If 7PM-7AM, please contact night-coverage at [email or web address], Office  [phone]   12/04/2022, 8:19 AM  LOS: 6 days

## 2022-12-04 NOTE — Progress Notes (Signed)
Physical Therapy Treatment Patient Details Name: [name] MRN: [id number] DOB: 1951/06/07 Today's Date: 12/04/2022   History of Present Illness The pt is a 72 yo female presenting 4/4 with hemoptysis and hypoxia to 51% on 2L O2. Pt found to have PNA with pulmonary fibrosis. Started HHFNC 4/6. PMH includes: CHF, emphysema, HTN, OA, afib, and chronic pain.    PT Comments    Patient progressing to in room ambulation as placed on 100% NRB mask on portable tank and though readings at times in low 80's bounced back to 90's without seated rest.  She was already tired from sitting up in chair several hours so assisted to supine.  Noted possible plans for [facility] and pt will benefit from follow up PT in that setting.  Will continue to follow during acute stay.   Recommendations for follow up therapy are one component of a multi-disciplinary discharge planning process, led by the attending physician.  Recommendations may be updated based on patient status, additional functional criteria and insurance authorization.  Follow Up Recommendations       Assistance Recommended at Discharge Intermittent Supervision/Assistance  Patient can return home with the following A little help with walking and/or transfers;Assistance with cooking/housework;Assist for transportation;Help with stairs or ramp for entrance   Equipment Recommendations  Rolling walker (2 wheels)    Recommendations for Other Services       Precautions / Restrictions Precautions Precautions: Fall Precaution Comments: Monitor O2     Mobility  Bed Mobility Overal bed mobility: Modified Independent             General bed mobility comments: sit to supine    Transfers Overall transfer level: Needs assistance Equipment used: Rolling walker (2 wheels) Transfers: Sit to/from Stand Sit to Stand: Min guard           General transfer comment: performed with A for lines and safety    Ambulation/Gait Ambulation/Gait  assistance: Min guard Gait Distance (Feet): 80 Feet Assistive device: Rolling walker (2 wheels)         General Gait Details: walked on 100% NRB per RT; around in room with RW x 2 laps to door, compuer then around bed side stepping for proximity with walker and to window.   Stairs             Wheelchair Mobility    Modified Rankin (Stroke Patients Only)       Balance Overall balance assessment: Needs assistance   Sitting balance-Leahy Scale: Good     Standing balance support: During functional activity, No upper extremity supported, Single extremity supported, Bilateral upper extremity supported Standing balance-Leahy Scale: Fair Standing balance comment: can stand without device, during function needs some support for safety                            Cognition Arousal/Alertness: Awake/alert Behavior During Therapy: WFL for tasks assessed/performed Overall Cognitive Status: Within Functional Limits for tasks assessed                                          Exercises      General Comments General comments (skin integrity, edema, etc.): on 100% NRB for in room ambulation on portable O2 with SpO2 occasionally reading into low 80's with probe on her toe then back into 90's without seated rest.  Pertinent Vitals/Pain Pain Assessment Pain Assessment: No/denies pain    Home Living                          Prior Function            PT Goals (current goals can now be found in the care plan section) Progress towards PT goals: Progressing toward goals    Frequency    Min 3X/week      PT Plan Discharge plan needs to be updated    Co-evaluation              AM-PAC PT "6 Clicks" Mobility   Outcome Measure  Help needed turning from your back to your side while in a flat bed without using bedrails?: None Help needed moving from lying on your back to sitting on the side of a flat bed without using  bedrails?: None Help needed moving to and from a bed to a chair (including a wheelchair)?: A Little Help needed standing up from a chair using your arms (e.g., wheelchair or bedside chair)?: A Little Help needed to walk in hospital room?: A Little Help needed climbing 3-5 steps with a railing? : Total 6 Click Score: 18    End of Session Equipment Utilized During Treatment: Oxygen Activity Tolerance: Patient tolerated treatment well Patient left: in bed;with bed alarm set;with call bell/phone within reach   PT Visit Diagnosis: Unsteadiness on feet (R26.81);Difficulty in walking, not elsewhere classified (R26.2)     Time: 1355-1415 PT Time Calculation (min) (ACUTE ONLY): 20 min  Charges:  $Gait Training: 8-22 mins                     Sheran Lawless, PT Acute Rehabilitation Services [phone] 12/04/2022    [name] 12/04/2022, 4:43 PM

## 2022-12-05 DIAGNOSIS — G894 Chronic pain syndrome: Secondary | ICD-10-CM | POA: Diagnosis not present

## 2022-12-05 DIAGNOSIS — I5032 Chronic diastolic (congestive) heart failure: Secondary | ICD-10-CM | POA: Diagnosis not present

## 2022-12-05 DIAGNOSIS — J9621 Acute and chronic respiratory failure with hypoxia: Secondary | ICD-10-CM | POA: Diagnosis not present

## 2022-12-05 DIAGNOSIS — J189 Pneumonia, unspecified organism: Secondary | ICD-10-CM | POA: Diagnosis not present

## 2022-12-05 LAB — GLUCOSE, CAPILLARY
Glucose-Capillary: 154 mg/dL — ABNORMAL HIGH (ref 70–99)
Glucose-Capillary: 111 mg/dL — ABNORMAL HIGH (ref 70–99)
Glucose-Capillary: 333 mg/dL — ABNORMAL HIGH (ref 70–99)
Glucose-Capillary: 245 mg/dL — ABNORMAL HIGH (ref 70–99)

## 2022-12-05 MED ORDER — FUROSEMIDE 40 MG PO TABS
40.0000 mg | ORAL_TABLET | Freq: Two times a day (BID) | ORAL | Status: DC
  Administered 2022-12-05 – 2022-12-07 (×4): 40 mg via ORAL
  Filled 2022-12-05 (×4): qty 1

## 2022-12-05 NOTE — TOC Progression Note (Signed)
Transition of Care (TOC) - Progression Note  Donn Pierini RN,BSN Transitions of Care Unit 4NP (Non Trauma)- RN Case Manager See Treatment Team for direct Phone #   Patient Details  Name: CHRISTNE AMPARO MRN: [id number] Date of Birth: [birth year]  Transition of Care [facility]) CM/SW Contact  Zenda Alpers Lenn Sink, RN Phone Number: 12/05/2022, 3:11 PM  Clinical Narrative:    CM spoke with Alden Server from [facility] this am- [facility] will move forward today and submit for insurance authoration.  CM spoke with pt at bedside and explained insurance is pending for [facility]- [facility]. Pt is asking about returning home- however pt currently on HFNC 60%. Explained to pt that 02 needs are still to high to return home and LTACH remains best transition plan. Pt voiced understanding.    Expected Discharge Plan: Long Term Acute Care (LTAC) Barriers to Discharge: Continued Medical Work up  Expected Discharge Plan and Services   Discharge Planning Services: CM Consult Post Acute Care Choice: Long Term Acute Care (LTAC) Living arrangements for the past 2 months: Single Family Home                                       Social Determinants of Health (SDOH) Interventions SDOH Screenings   Food Insecurity: No Food Insecurity (12/03/2022)  Housing: Low Risk  (12/03/2022)  Transportation Needs: No Transportation Needs (12/03/2022)  Utilities: Not At Risk (12/03/2022)  Tobacco Use: Medium Risk (11/28/2022)    Readmission Risk Interventions     No data to display

## 2022-12-05 NOTE — Progress Notes (Signed)
[facility]  PROGRESS NOTE  [name] XBL:[id number] DOB: Jul 31, 1951 DOA: 11/28/2022 PCP: Toni Blamer, MD   Brief HPI:   72 year old female with history of chronic hypoxemic respiratory failure, pulmonary fibrosis, A-fib on Eliquis, hypertension, anxiety, chronic pain presented with cough, shortness of breath.  CTA chest showed diffuse patchy opacity on background of fibrosis, suspicious for atypical infection.    Patient admitted with sepsis due to community-acquired pneumonia, acute on chronic hypoxemic respiratory failure in the setting of pulmonary fibrosis.    Subjective   Patient seen and examined, breathing has improved.  Now requiring oxygen, 8 L/min HFNC.   Assessment/Plan:    Sepsis due to community-acquired pneumonia -Presented with fever, tachypnea, tachycardia, pneumonia -Acute on chronic hypoxemic respiratory failure/pulmonary fibrosis; 2 L oxygen at baseline -Currently requiring 8L/min HF [name].  COVID, influenza, RSV negative -Respiratory virus panel negative -Steroid taper per pulmonology, s/p 5 days of antibiotics with ceftriaxone/doxycycline -Diuresed with Lasix 40 mg IV every 12 hours, EF 60 to 65% with grade 1 diastolic dysfunction as per echo -BUN is not rising, will discontinue IV Lasix and start Lasix 40 mg p.o. twice daily.   Asymptomatic bacteriuria -Proteus mirabilis on urine culture, asymptomatic -Completed antibiotics with ceftriaxone for pneumonia as above  Diabetes mellitus type 2 -Hemoglobin A1c 6.6, continue sliding scale insulin NovoLog -Insulin detemir 5 units subcu twice daily; CBG well-controlled  Atrial fibrillation -Continue Eliquis, Tikosyn, diltiazem -Heart rate controlled  Obstructive sleep apnea -Continue BiPAP nightly  Hypertension -Continue diltiazem, irbesartan  Chronic pain/anxiety -Continue Norco, Ativan  Hemoptysis -Had scant hemoptysis, no PE on CTA chest -Likely in setting of atypical infection  pulmonary fibrosis -Eliquis was held, has been resumed  Thoracic aortic ectasia -Noted on CTA chest in the ED -Follow CT surgery as outpatient as planned     Medications     apixaban  5 mg Oral BID   atorvastatin  40 mg Oral Daily   brimonidine  1 drop Both Eyes BID   diltiazem  240 mg Oral Daily   dofetilide  500 mcg Oral BID   furosemide  40 mg Intravenous Q12H   insulin aspart  0-15 Units Subcutaneous TID WC   insulin aspart  0-5 Units Subcutaneous QHS   insulin detemir  5 Units Subcutaneous BID   irbesartan  300 mg Oral Daily   predniSONE  40 mg Oral Q breakfast   Followed by   Melene Muller ON 12/10/2022] predniSONE  20 mg Oral Q breakfast   sodium chloride flush  3 mL Intravenous Q12H     Data Reviewed:   CBG:  Recent Labs  Lab 12/04/22 0811 12/04/22 1138 12/04/22 1519 12/04/22 2102 12/05/22 0618  GLUCAP 168* 107* 226* 198* 154*    SpO2: 93 % O2 Flow Rate (L/min): 8 L/min FiO2 (%): 60 %    Vitals:   12/04/22 2259 12/05/22 0311 12/05/22 0326 12/05/22 0801  BP: 102/72 122/75  119/73  Pulse: (!) 59 (!) 59 (!) 59 66  Resp: 20 19 (!) 24 (!) 27  Temp: (!) 97.5 F (36.4 C) (!) 97.5 F (36.4 C)  98.3 F (36.8 C)  TempSrc: Oral Oral  Oral  SpO2: 94% 96% 94% 93%  Weight:      Height:          Data Reviewed:  Basic Metabolic Panel: Recent Labs  Lab 11/29/22 0053 11/30/22 0649 12/01/22 0357 12/02/22 0317 12/03/22 0423 12/04/22 0257  NA 140 141 138 141 139 139  K 3.9 3.9 4.3  4.1 3.2* 3.9  CL 107 104 105 102 100 100  CO2 24 24 25 29 27 30   GLUCOSE 196* 149* 227* 178* 200* 137*  BUN 17 32* 41* 37* 41* 43*  CREATININE 0.80 0.95 1.13* 0.97 0.96 0.95  CALCIUM 9.0 9.4 9.0 8.6* 8.7* 9.0  MG 2.3  --   --  2.0 2.0 2.2  PHOS  --   --   --  4.0 4.1 3.5    CBC: Recent Labs  Lab 11/28/22 1431 11/29/22 0053 11/30/22 0649 12/01/22 0357 12/02/22 0317 12/03/22 0423 12/04/22 0257  WBC 10.6*   < > 19.0* 17.2* 12.8* 12.0* 12.5*  NEUTROABS 8.4*  --   --    --  11.6* 10.4* 10.1*  HGB 14.2   < > 13.3 13.6 12.9 14.0 14.2  HCT 43.3   < > 41.5 41.3 40.5 42.3 42.7  MCV 90.2   < > 91.6 91.0 91.6 89.6 89.3  PLT 208   < > 211 234 227 221 222   < > = values in this interval not displayed.    LFT Recent Labs  Lab 11/28/22 1431 12/03/22 0423 12/04/22 0257  AST 21 22 28   ALT 15 22 34  ALKPHOS 100 80 70  BILITOT 0.8 0.6 1.1  PROT 8.1 6.4* 6.0*  ALBUMIN 3.5 2.6* 2.6*     Antibiotics: Anti-infectives (From admission, onward)    Start     Dose/Rate Route Frequency Ordered Stop   11/29/22 1800  vancomycin (VANCOREADY) IVPB 750 mg/150 mL  Status:  Discontinued        750 mg 150 mL/hr over 60 Minutes Intravenous Every 24 hours 11/28/22 1844 11/28/22 2031   11/29/22 0600  ceFEPIme (MAXIPIME) 2 g in sodium chloride 0.9 % 100 mL IVPB  Status:  Discontinued        2 g 200 mL/hr over 30 Minutes Intravenous Every 12 hours 11/28/22 1844 11/28/22 2031   11/28/22 2300  cefTRIAXone (ROCEPHIN) 2 g in sodium chloride 0.9 % 100 mL IVPB        2 g 200 mL/hr over 30 Minutes Intravenous Every 24 hours 11/28/22 2031 12/03/22 0700   11/28/22 2200  doxycycline (VIBRAMYCIN) 100 mg in sodium chloride 0.9 % 250 mL IVPB        100 mg 125 mL/hr over 120 Minutes Intravenous Every 12 hours 11/28/22 2048 12/03/22 1121   11/28/22 2045  azithromycin (ZITHROMAX) 500 mg in sodium chloride 0.9 % 250 mL IVPB  Status:  Discontinued        500 mg 250 mL/hr over 60 Minutes Intravenous Every 24 hours 11/28/22 2031 11/28/22 2048   11/28/22 1600  ceFEPIme (MAXIPIME) 2 g in sodium chloride 0.9 % 100 mL IVPB        2 g 200 mL/hr over 30 Minutes Intravenous  Once 11/28/22 1547 11/28/22 1806   11/28/22 1600  metroNIDAZOLE (FLAGYL) IVPB 500 mg        500 mg 100 mL/hr over 60 Minutes Intravenous  Once 11/28/22 1547 11/28/22 1723   11/28/22 1600  vancomycin (VANCOCIN) IVPB 1000 mg/200 mL premix  Status:  Discontinued        1,000 mg 200 mL/hr over 60 Minutes Intravenous  Once  11/28/22 1547 11/28/22 1550   11/28/22 1600  vancomycin (VANCOREADY) IVPB 1750 mg/350 mL        1,750 mg 175 mL/hr over 120 Minutes Intravenous  Once 11/28/22 1550 11/28/22 2212        DVT  prophylaxis: Apixaban  Code Status: Full code  Family Communication:    CONSULTS pulmonology   Objective    Physical Examination:   General-appears in no acute distress Heart-S1-S2, regular, no murmur auscultated Lungs-decreased breath sounds bilaterally Abdomen-soft, nontender, no organomegaly Extremities-no edema in the lower extremities Neuro-alert, oriented x3, no focal deficit noted   Status is: Inpatient:             [name]   [facility] If 7PM-7AM, please contact night-coverage at [email or web address], Office  [phone]   12/05/2022, 9:08 AM  LOS: 7 days

## 2022-12-06 DIAGNOSIS — J9621 Acute and chronic respiratory failure with hypoxia: Secondary | ICD-10-CM | POA: Diagnosis not present

## 2022-12-06 DIAGNOSIS — J189 Pneumonia, unspecified organism: Secondary | ICD-10-CM | POA: Diagnosis not present

## 2022-12-06 DIAGNOSIS — I5032 Chronic diastolic (congestive) heart failure: Secondary | ICD-10-CM | POA: Diagnosis not present

## 2022-12-06 DIAGNOSIS — G894 Chronic pain syndrome: Secondary | ICD-10-CM | POA: Diagnosis not present

## 2022-12-06 LAB — BASIC METABOLIC PANEL
Anion gap: 9 (ref 5–15)
BUN: 43 mg/dL — ABNORMAL HIGH (ref 8–23)
CO2: 32 mmol/L (ref 22–32)
Calcium: 8.7 mg/dL — ABNORMAL LOW (ref 8.9–10.3)
Chloride: 93 mmol/L — ABNORMAL LOW (ref 98–111)
Creatinine, Ser: 1.12 mg/dL — ABNORMAL HIGH (ref 0.44–1.00)
GFR, Estimated: 52 mL/min — ABNORMAL LOW (ref >60)
Glucose, Bld: 76 mg/dL (ref 70–99)
Potassium: 3.9 mmol/L (ref 3.5–5.1)
Sodium: 134 mmol/L — ABNORMAL LOW (ref 135–145)

## 2022-12-06 LAB — GLUCOSE, CAPILLARY
Glucose-Capillary: 95 mg/dL (ref 70–99)
Glucose-Capillary: 112 mg/dL — ABNORMAL HIGH (ref 70–99)
Glucose-Capillary: 164 mg/dL — ABNORMAL HIGH (ref 70–99)
Glucose-Capillary: 177 mg/dL — ABNORMAL HIGH (ref 70–99)
Glucose-Capillary: 191 mg/dL — ABNORMAL HIGH (ref 70–99)

## 2022-12-06 LAB — CBC
HCT: 43.5 % (ref 36.0–46.0)
Hemoglobin: 14.5 g/dL (ref 12.0–15.0)
MCH: 29.8 pg (ref 26.0–34.0)
MCHC: 33.3 g/dL (ref 30.0–36.0)
MCV: 89.3 fL (ref 80.0–100.0)
Platelets: 221 10*3/uL (ref 150–400)
RBC: 4.87 MIL/uL (ref 3.87–5.11)
RDW: 14 % (ref 11.5–15.5)
WBC: 15.7 10*3/uL — ABNORMAL HIGH (ref 4.0–10.5)
nRBC: 0 % (ref 0.0–0.2)

## 2022-12-06 NOTE — Progress Notes (Signed)
Pt taken off heated HFNC 15L 50% and placed on HFNC 10L by RT. Pt tolerating well at this time, No increased WOB noted, vitals stable, SpO2 93%, RT will monitor.      12/06/22 0840  Therapy Vitals  Pulse Rate 72  Resp (!) 27  MEWS Score/Color  MEWS Score 2  MEWS Score Color Yellow  Respiratory Assessment  Assessment Type Assess only  Respiratory Pattern Regular;Unlabored  Chest Assessment Chest expansion symmetrical  Cough Non-productive  Sputum Specimen Source Spontaneous cough  Bilateral Breath Sounds Clear  Oxygen Therapy/Pulse Ox  O2 Device (S)  HFNC (salter)  O2 Therapy Oxygen humidified  O2 Flow Rate (L/min) (S)  10 L/min  SpO2 93 %

## 2022-12-06 NOTE — Progress Notes (Signed)
Notified by insurance plan that P2P is being requested on request for [facility].  MD can call [phone] opt. 5   Deadline is 4pm today for P2P- attending MD made aware of request.

## 2022-12-06 NOTE — Progress Notes (Signed)
Physical Therapy Treatment Patient Details Name: [name] MRN: [id number] DOB: [birth year] Today's Date: 12/06/2022   History of Present Illness The pt is a 72 yo female presenting 4/4 with hemoptysis and hypoxia to 51% on 2L O2. Pt found to have PNA with pulmonary fibrosis. Started HHFNC 4/6. PMH includes: CHF, emphysema, HTN, OA, afib, and chronic pain.    PT Comments    Patent progressing with mobility and tolerating less O2 at rest.  She is slow and appropriately guarded with ambulation, stopping to rest even though not significantly dyspneic and denies adverse symptoms.  Sister at the bedside today.  Continue to recommend follow up PT either at [facility] or via [facility].    Recommendations for follow up therapy are one component of a multi-disciplinary discharge planning process, led by the attending physician.  Recommendations may be updated based on patient status, additional functional criteria and insurance authorization.  Follow Up Recommendations       Assistance Recommended at Discharge Intermittent Supervision/Assistance  Patient can return home with the following A little help with walking and/or transfers;Assistance with cooking/housework;Assist for transportation;Help with stairs or ramp for entrance   Equipment Recommendations  Rolling walker (2 wheels)    Recommendations for Other Services       Precautions / Restrictions Precautions Precautions: Fall Precaution Comments: Monitor O2 Restrictions Weight Bearing Restrictions: No     Mobility  Bed Mobility Overal bed mobility: Modified Independent                  Transfers Overall transfer level: Needs assistance Equipment used: Rolling walker (2 wheels) Transfers: Sit to/from Stand Sit to Stand: Min guard           General transfer comment: slow to stand, assist for lines and balance    Ambulation/Gait Ambulation/Gait assistance: Min guard Gait Distance (Feet): 80 Feet Assistive device:  Rolling walker (2 wheels) Gait Pattern/deviations: Step-through pattern, Decreased stride length       General Gait Details: on 100% NRB to nursing station and back with assist for O2 and cues for walker management   Stairs             Wheelchair Mobility    Modified Rankin (Stroke Patients Only)       Balance Overall balance assessment: Needs assistance Sitting-balance support: Feet supported Sitting balance-Leahy Scale: Good     Standing balance support: No upper extremity supported Standing balance-Leahy Scale: Fair Standing balance comment: walker for ambulation, no UE support during some UE and trunk stretches in standing                            Cognition Arousal/Alertness: Awake/alert Behavior During Therapy: WFL for tasks assessed/performed Overall Cognitive Status: Within Functional Limits for tasks assessed                                          Exercises Other Exercises Other Exercises: sit<>stand x 5 Other Exercises: standing arm elevation and trunk rotation stretches    General Comments General comments (skin integrity, edema, etc.): on 15L Hi flow [place] without heat.  walked on 100% NRB, SpO2 probe on toe not reading during ambulation, once seated and removed sock reading 90-91%.  Sister in the room and supportive.  States may go to fifth floor room (?[facility]).  Pertinent Vitals/Pain Pain Assessment Pain Assessment: No/denies pain    Home Living                          Prior Function            PT Goals (current goals can now be found in the care plan section) Progress towards PT goals: Progressing toward goals    Frequency    Min 3X/week      PT Plan Current plan remains appropriate    Co-evaluation              AM-PAC PT "6 Clicks" Mobility   Outcome Measure  Help needed turning from your back to your side while in a flat bed without using bedrails?: None Help needed  moving from lying on your back to sitting on the side of a flat bed without using bedrails?: None Help needed moving to and from a bed to a chair (including a wheelchair)?: A Little   Help needed to walk in hospital room?: A Little Help needed climbing 3-5 steps with a railing? : Total 6 Click Score: 15    End of Session Equipment Utilized During Treatment: Gait belt;Oxygen Activity Tolerance: Patient tolerated treatment well Patient left: in chair;with call bell/phone within reach;with family/visitor present   PT Visit Diagnosis: Unsteadiness on feet (R26.81);Difficulty in walking, not elsewhere classified (R26.2)     Time: 1036-1100 PT Time Calculation (min) (ACUTE ONLY): 24 min  Charges:  $Gait Training: 8-22 mins $Therapeutic Activity: 8-22 mins                     [name], PT Acute Rehabilitation Services Office:[phone] 12/06/2022    [name] 12/06/2022, 11:06 AM

## 2022-12-06 NOTE — Plan of Care (Signed)
°  Problem: Activity: °Goal: Ability to tolerate increased activity will improve °Outcome: Progressing °  °Problem: Respiratory: °Goal: Ability to maintain adequate ventilation will improve °Outcome: Progressing °Goal: Ability to maintain a clear airway will improve °Outcome: Progressing °  °Problem: Education: °Goal: Knowledge of General Education information will improve °Description: Including pain rating scale, medication(s)/side effects and non-pharmacologic comfort measures °Outcome: Progressing °  °Problem: Clinical Measurements: °Goal: Ability to maintain clinical measurements within normal limits will improve °Outcome: Progressing °Goal: Respiratory complications will improve °Outcome: Progressing °  °Problem: Activity: °Goal: Risk for activity intolerance will decrease °Outcome: Progressing °  °

## 2022-12-06 NOTE — Progress Notes (Addendum)
[facility]  PROGRESS NOTE  [name] PJA:[id number] DOB: 22-Jun-1951 DOA: 11/28/2022 PCP: Toni Blamer, MD   Brief HPI:   72 year old female with history of chronic hypoxemic respiratory failure, pulmonary fibrosis, A-fib on Eliquis, hypertension, anxiety, chronic pain presented with cough, shortness of breath.  CTA chest showed diffuse patchy opacity on background of fibrosis, suspicious for atypical infection.    Patient admitted with sepsis due to community-acquired pneumonia, acute on chronic hypoxemic respiratory failure in the setting of pulmonary fibrosis.    Subjective   Patient seen, breathing has improved.   Assessment/Plan:    Sepsis due to community-acquired pneumonia -Presented with fever, tachypnea, tachycardia, pneumonia -Acute on chronic hypoxemic respiratory failure/pulmonary fibrosis; 2 L oxygen at baseline -Currently requiring 10L/min HF [place].  COVID, influenza, RSV negative -Respiratory virus panel negative -Steroid taper per pulmonology, Toni/p 5 days of antibiotics with ceftriaxone/doxycycline -Diuresed with Lasix 40 mg IV every 12 hours, EF 60 to 65% with grade 1 diastolic dysfunction as per echo -BUN is  rising, so  discontinued IV Lasix and started Lasix 40 mg p.o. twice daily.   Asymptomatic bacteriuria -Proteus mirabilis on urine culture, asymptomatic -Completed antibiotics with ceftriaxone for pneumonia as above  Diabetes mellitus type 2 -Hemoglobin A1c 6.6, continue sliding scale insulin NovoLog -Insulin detemir 5 units subcu twice daily; CBG well-controlled  Atrial fibrillation -Continue Eliquis, Tikosyn, diltiazem -Heart rate controlled  Obstructive sleep apnea -Continue BiPAP nightly  Hypertension -Continue diltiazem, irbesartan  Chronic pain/anxiety -Continue Norco, Ativan  Hemoptysis -Had scant hemoptysis, no PE on CTA chest -Likely in setting of atypical infection pulmonary fibrosis -Eliquis was held, has been  resumed  Thoracic aortic ectasia -Noted on CTA chest in the ED -Follow CT surgery as outpatient as planned     Medications     apixaban  5 mg Oral BID   atorvastatin  40 mg Oral Daily   brimonidine  1 drop Both Eyes BID   diltiazem  240 mg Oral Daily   dofetilide  500 mcg Oral BID   furosemide  40 mg Oral BID   insulin aspart  0-15 Units Subcutaneous TID WC   insulin aspart  0-5 Units Subcutaneous QHS   insulin detemir  5 Units Subcutaneous BID   irbesartan  300 mg Oral Daily   predniSONE  40 mg Oral Q breakfast   Followed by   Melene Muller ON 12/10/2022] predniSONE  20 mg Oral Q breakfast   sodium chloride flush  3 mL Intravenous Q12H     Data Reviewed:   CBG:  Recent Labs  Lab 12/05/22 0618 12/05/22 1151 12/05/22 1841 12/05/22 2123 12/06/22 0738  GLUCAP 154* 111* 333* 245* 95    SpO2: 94 % O2 Flow Rate (L/min): 20 L/min FiO2 (%): 50 %    Vitals:   12/06/22 0500 12/06/22 0600 12/06/22 0606 12/06/22 0741  BP:  115/70  109/66  Pulse: (!) 58 62 67 71  Resp: 20 (!) 26 14 16   Temp:    98.5 F (36.9 C)  TempSrc:    Oral  SpO2: 93% 93% (!) 89% 94%  Weight:      Height:          Data Reviewed:  Basic Metabolic Panel: Recent Labs  Lab 12/01/22 0357 12/02/22 0317 12/03/22 0423 12/04/22 0257 12/06/22 0248  NA 138 141 139 139 134*  K 4.3 4.1 3.2* 3.9 3.9  CL 105 102 100 100 93*  CO2 25 29 27 30  32  GLUCOSE 227* 178* 200*  137* 76  BUN 41* 37* 41* 43* 43*  CREATININE 1.13* 0.97 0.96 0.95 1.12*  CALCIUM 9.0 8.6* 8.7* 9.0 8.7*  MG  --  2.0 2.0 2.2  --   PHOS  --  4.0 4.1 3.5  --     CBC: Recent Labs  Lab 12/01/22 0357 12/02/22 0317 12/03/22 0423 12/04/22 0257 12/06/22 0248  WBC 17.2* 12.8* 12.0* 12.5* 15.7*  NEUTROABS  --  11.6* 10.4* 10.1*  --   HGB 13.6 12.9 14.0 14.2 14.5  HCT 41.3 40.5 42.3 42.7 43.5  MCV 91.0 91.6 89.6 89.3 89.3  PLT 234 227 221 222 221    LFT Recent Labs  Lab 12/03/22 0423 12/04/22 0257  AST 22 28  ALT 22 34   ALKPHOS 80 70  BILITOT 0.6 1.1  PROT 6.4* 6.0*  ALBUMIN 2.6* 2.6*     Antibiotics: Anti-infectives (From admission, onward)    Start     Dose/Rate Route Frequency Ordered Stop   11/29/22 1800  vancomycin (VANCOREADY) IVPB 750 mg/150 mL  Status:  Discontinued        750 mg 150 mL/hr over 60 Minutes Intravenous Every 24 hours 11/28/22 1844 11/28/22 2031   11/29/22 0600  ceFEPIme (MAXIPIME) 2 g in sodium chloride 0.9 % 100 mL IVPB  Status:  Discontinued        2 g 200 mL/hr over 30 Minutes Intravenous Every 12 hours 11/28/22 1844 11/28/22 2031   11/28/22 2300  cefTRIAXone (ROCEPHIN) 2 g in sodium chloride 0.9 % 100 mL IVPB        2 g 200 mL/hr over 30 Minutes Intravenous Every 24 hours 11/28/22 2031 12/03/22 0700   11/28/22 2200  doxycycline (VIBRAMYCIN) 100 mg in sodium chloride 0.9 % 250 mL IVPB        100 mg 125 mL/hr over 120 Minutes Intravenous Every 12 hours 11/28/22 2048 12/03/22 1121   11/28/22 2045  azithromycin (ZITHROMAX) 500 mg in sodium chloride 0.9 % 250 mL IVPB  Status:  Discontinued        500 mg 250 mL/hr over 60 Minutes Intravenous Every 24 hours 11/28/22 2031 11/28/22 2048   11/28/22 1600  ceFEPIme (MAXIPIME) 2 g in sodium chloride 0.9 % 100 mL IVPB        2 g 200 mL/hr over 30 Minutes Intravenous  Once 11/28/22 1547 11/28/22 1806   11/28/22 1600  metroNIDAZOLE (FLAGYL) IVPB 500 mg        500 mg 100 mL/hr over 60 Minutes Intravenous  Once 11/28/22 1547 11/28/22 1723   11/28/22 1600  vancomycin (VANCOCIN) IVPB 1000 mg/200 mL premix  Status:  Discontinued        1,000 mg 200 mL/hr over 60 Minutes Intravenous  Once 11/28/22 1547 11/28/22 1550   11/28/22 1600  vancomycin (VANCOREADY) IVPB 1750 mg/350 mL        1,750 mg 175 mL/hr over 120 Minutes Intravenous  Once 11/28/22 1550 11/28/22 2212        DVT prophylaxis: Apixaban  Code Status: Full code  Family Communication:    CONSULTS pulmonology   Objective    Physical  Examination:   General-appears in no acute distress Heart-S1-S2, regular, no murmur auscultated Lungs-bibasilar crackles Abdomen-soft, nontender, no organomegaly Extremities-no edema in the lower extremities Neuro-alert, oriented x3, no focal deficit noted   Status is: Inpatient:             [name] [name]   [facility] If 7PM-7AM, please contact night-coverage at  [email or web address], Office  [phone]   12/06/2022, 8:07 AM  LOS: 8 days

## 2022-12-06 NOTE — Care Management Important Message (Signed)
Important Message  Patient Details  Name: LATAMARA RIPPER MRN: [id number] Date of Birth: [birth year]   Medicare Important Message Given:  Yes     Sherilyn Banker 12/06/2022, 1:12 PM

## 2022-12-07 DIAGNOSIS — I5032 Chronic diastolic (congestive) heart failure: Secondary | ICD-10-CM | POA: Diagnosis not present

## 2022-12-07 DIAGNOSIS — J9621 Acute and chronic respiratory failure with hypoxia: Secondary | ICD-10-CM | POA: Diagnosis not present

## 2022-12-07 DIAGNOSIS — J189 Pneumonia, unspecified organism: Secondary | ICD-10-CM | POA: Diagnosis not present

## 2022-12-07 DIAGNOSIS — G894 Chronic pain syndrome: Secondary | ICD-10-CM | POA: Diagnosis not present

## 2022-12-07 LAB — GLUCOSE, CAPILLARY
Glucose-Capillary: 152 mg/dL — ABNORMAL HIGH (ref 70–99)
Glucose-Capillary: 223 mg/dL — ABNORMAL HIGH (ref 70–99)
Glucose-Capillary: 253 mg/dL — ABNORMAL HIGH (ref 70–99)
Glucose-Capillary: 250 mg/dL — ABNORMAL HIGH (ref 70–99)
Glucose-Capillary: 131 mg/dL — ABNORMAL HIGH (ref 70–99)
Glucose-Capillary: 175 mg/dL — ABNORMAL HIGH (ref 70–99)

## 2022-12-07 LAB — BASIC METABOLIC PANEL
Anion gap: 6 (ref 5–15)
BUN: 42 mg/dL — ABNORMAL HIGH (ref 8–23)
CO2: 33 mmol/L — ABNORMAL HIGH (ref 22–32)
Calcium: 8.7 mg/dL — ABNORMAL LOW (ref 8.9–10.3)
Chloride: 96 mmol/L — ABNORMAL LOW (ref 98–111)
Creatinine, Ser: 1.19 mg/dL — ABNORMAL HIGH (ref 0.44–1.00)
GFR, Estimated: 49 mL/min — ABNORMAL LOW (ref >60)
Glucose, Bld: 127 mg/dL — ABNORMAL HIGH (ref 70–99)
Potassium: 3.9 mmol/L (ref 3.5–5.1)
Sodium: 135 mmol/L (ref 135–145)

## 2022-12-07 MED ORDER — POTASSIUM CHLORIDE CRYS ER 10 MEQ PO TBCR
10.0000 meq | EXTENDED_RELEASE_TABLET | Freq: Every day | ORAL | Status: DC
  Administered 2022-12-07 – 2022-12-12 (×6): 10 meq via ORAL
  Filled 2022-12-07 (×6): qty 1

## 2022-12-07 MED ORDER — FUROSEMIDE 40 MG PO TABS
40.0000 mg | ORAL_TABLET | Freq: Every day | ORAL | Status: DC
  Administered 2022-12-08 – 2022-12-09 (×2): 40 mg via ORAL
  Filled 2022-12-07 (×2): qty 1

## 2022-12-07 NOTE — Progress Notes (Addendum)
[facility]  PROGRESS NOTE  [name] LKJ:[id number] DOB: 1951-06-11 DOA: 11/28/2022 PCP: [name], MD   Brief HPI:   72 year old female with history of chronic hypoxemic respiratory failure, pulmonary fibrosis, A-fib on Eliquis, hypertension, anxiety, chronic pain presented with cough, shortness of breath.  CTA chest showed diffuse patchy opacity on background of fibrosis, suspicious for atypical infection.    Patient admitted with sepsis due to community-acquired pneumonia, acute on chronic hypoxemic respiratory failure in the setting of pulmonary fibrosis.    Subjective   Patient seen and examined, currently on 8 to 10 L of oxygen via nasal cannula.  Breathing has improved.   Assessment/Plan:    Sepsis due to community-acquired pneumonia -Presented with fever, tachypnea, tachycardia, pneumonia -Acute on chronic hypoxemic respiratory failure/pulmonary fibrosis; 2 L oxygen at baseline -Currently requiring 10L/min [place].  COVID, influenza, RSV negative -Respiratory virus panel negative -Steroid taper per pulmonology, s/p 5 days of antibiotics with ceftriaxone/doxycycline -Diuresed with Lasix 40 mg IV every 12 hours, EF 60 to 65% with grade 1 diastolic dysfunction as per echo -BUN is  rising, so  discontinued IV Lasix and started Lasix 40 mg p.o. twice daily. -Kidney function is still getting worse, will change Lasix to 40 mg p.o. daily.  Net -8.5 L.   Asymptomatic bacteriuria -Proteus mirabilis on urine culture, asymptomatic -Completed antibiotics with ceftriaxone for pneumonia as above  Diabetes mellitus type 2 -Hemoglobin A1c 6.6, continue sliding scale insulin NovoLog -Insulin detemir 5 units subcu twice daily; CBG well-controlled  Atrial fibrillation -Continue Eliquis, Tikosyn, diltiazem -Heart rate controlled  Obstructive sleep apnea -Continue BiPAP nightly  Hypertension -Continue diltiazem, irbesartan  Chronic pain/anxiety -Continue Norco,  Ativan  Hemoptysis -Had scant hemoptysis, no PE on CTA chest -Likely in setting of atypical infection pulmonary fibrosis -Eliquis was held, has been resumed  Thoracic aortic ectasia -Noted on CTA chest in the ED -Follow CT surgery as outpatient as planned  Peer to peer discussion with insurance physician done yesterday, patient denied to go to LTAC   Medications     apixaban  5 mg Oral BID   atorvastatin  40 mg Oral Daily   brimonidine  1 drop Both Eyes BID   diltiazem  240 mg Oral Daily   dofetilide  500 mcg Oral BID   furosemide  40 mg Oral BID   insulin aspart  0-15 Units Subcutaneous TID WC   insulin aspart  0-5 Units Subcutaneous QHS   insulin detemir  5 Units Subcutaneous BID   irbesartan  300 mg Oral Daily   predniSONE  40 mg Oral Q breakfast   Followed by   Melene Muller ON 12/10/2022] predniSONE  20 mg Oral Q breakfast   sodium chloride flush  3 mL Intravenous Q12H     Data Reviewed:   CBG:  Recent Labs  Lab 12/06/22 1131 12/06/22 1524 12/06/22 1649 12/06/22 2122 12/07/22 0718  GLUCAP 112* 164* 177* 191* 152*    SpO2: 96 % O2 Flow Rate (L/min): 10 L/min FiO2 (%): 50 %    Vitals:   12/07/22 0155 12/07/22 0500 12/07/22 0711 12/07/22 0833  BP: 106/67  118/63   Pulse: (!) 58   70  Resp: 20   (!) 27  Temp: 97.6 F (36.4 C)  97.9 F (36.6 C)   TempSrc:   Oral   SpO2:    96%  Weight:  88.1 kg    Height:          Data Reviewed:  Basic Metabolic  Panel: Recent Labs  Lab 12/02/22 0317 12/03/22 0423 12/04/22 0257 12/06/22 0248 12/07/22 0038  NA 141 139 139 134* 135  K 4.1 3.2* 3.9 3.9 3.9  CL 102 100 100 93* 96*  CO2 29 27 30  32 33*  GLUCOSE 178* 200* 137* 76 127*  BUN 37* 41* 43* 43* 42*  CREATININE 0.97 0.96 0.95 1.12* 1.19*  CALCIUM 8.6* 8.7* 9.0 8.7* 8.7*  MG 2.0 2.0 2.2  --   --   PHOS 4.0 4.1 3.5  --   --     CBC: Recent Labs  Lab 12/01/22 0357 12/02/22 0317 12/03/22 0423 12/04/22 0257 12/06/22 0248  WBC 17.2* 12.8* 12.0*  12.5* 15.7*  NEUTROABS  --  11.6* 10.4* 10.1*  --   HGB 13.6 12.9 14.0 14.2 14.5  HCT 41.3 40.5 42.3 42.7 43.5  MCV 91.0 91.6 89.6 89.3 89.3  PLT 234 227 221 222 221    LFT Recent Labs  Lab 12/03/22 0423 12/04/22 0257  AST 22 28  ALT 22 34  ALKPHOS 80 70  BILITOT 0.6 1.1  PROT 6.4* 6.0*  ALBUMIN 2.6* 2.6*     Antibiotics: Anti-infectives (From admission, onward)    Start     Dose/Rate Route Frequency Ordered Stop   11/29/22 1800  vancomycin (VANCOREADY) IVPB 750 mg/150 mL  Status:  Discontinued        750 mg 150 mL/hr over 60 Minutes Intravenous Every 24 hours 11/28/22 1844 11/28/22 2031   11/29/22 0600  ceFEPIme (MAXIPIME) 2 g in sodium chloride 0.9 % 100 mL IVPB  Status:  Discontinued        2 g 200 mL/hr over 30 Minutes Intravenous Every 12 hours 11/28/22 1844 11/28/22 2031   11/28/22 2300  cefTRIAXone (ROCEPHIN) 2 g in sodium chloride 0.9 % 100 mL IVPB        2 g 200 mL/hr over 30 Minutes Intravenous Every 24 hours 11/28/22 2031 12/03/22 0700   11/28/22 2200  doxycycline (VIBRAMYCIN) 100 mg in sodium chloride 0.9 % 250 mL IVPB        100 mg 125 mL/hr over 120 Minutes Intravenous Every 12 hours 11/28/22 2048 12/03/22 1121   11/28/22 2045  azithromycin (ZITHROMAX) 500 mg in sodium chloride 0.9 % 250 mL IVPB  Status:  Discontinued        500 mg 250 mL/hr over 60 Minutes Intravenous Every 24 hours 11/28/22 2031 11/28/22 2048   11/28/22 1600  ceFEPIme (MAXIPIME) 2 g in sodium chloride 0.9 % 100 mL IVPB        2 g 200 mL/hr over 30 Minutes Intravenous  Once 11/28/22 1547 11/28/22 1806   11/28/22 1600  metroNIDAZOLE (FLAGYL) IVPB 500 mg        500 mg 100 mL/hr over 60 Minutes Intravenous  Once 11/28/22 1547 11/28/22 1723   11/28/22 1600  vancomycin (VANCOCIN) IVPB 1000 mg/200 mL premix  Status:  Discontinued        1,000 mg 200 mL/hr over 60 Minutes Intravenous  Once 11/28/22 1547 11/28/22 1550   11/28/22 1600  vancomycin (VANCOREADY) IVPB 1750 mg/350 mL        1,750  mg 175 mL/hr over 120 Minutes Intravenous  Once 11/28/22 1550 11/28/22 2212        DVT prophylaxis: Apixaban  Code Status: Full code  Family Communication:    CONSULTS pulmonology   Objective    Physical Examination:  General-appears in no acute distress Heart-S1-S2, regular, no murmur auscultated Lungs-bibasilar crackles  auscultated Abdomen-soft, nontender, no organomegaly Extremities-no edema in the lower extremities Neuro-alert, oriented x3, no focal deficit noted  Status is: Inpatient:             Meredeth Ide   [facility] If 7PM-7AM, please contact night-coverage at [email or web address], Office  [phone]   12/07/2022, 8:54 AM  LOS: 9 days

## 2022-12-08 DIAGNOSIS — J9621 Acute and chronic respiratory failure with hypoxia: Secondary | ICD-10-CM | POA: Diagnosis not present

## 2022-12-08 DIAGNOSIS — G894 Chronic pain syndrome: Secondary | ICD-10-CM | POA: Diagnosis not present

## 2022-12-08 DIAGNOSIS — I5032 Chronic diastolic (congestive) heart failure: Secondary | ICD-10-CM | POA: Diagnosis not present

## 2022-12-08 DIAGNOSIS — J189 Pneumonia, unspecified organism: Secondary | ICD-10-CM | POA: Diagnosis not present

## 2022-12-08 LAB — BASIC METABOLIC PANEL
Anion gap: 7 (ref 5–15)
BUN: 41 mg/dL — ABNORMAL HIGH (ref 8–23)
CO2: 33 mmol/L — ABNORMAL HIGH (ref 22–32)
Calcium: 8.6 mg/dL — ABNORMAL LOW (ref 8.9–10.3)
Chloride: 95 mmol/L — ABNORMAL LOW (ref 98–111)
Creatinine, Ser: 1.14 mg/dL — ABNORMAL HIGH (ref 0.44–1.00)
GFR, Estimated: 51 mL/min — ABNORMAL LOW (ref >60)
Glucose, Bld: 155 mg/dL — ABNORMAL HIGH (ref 70–99)
Potassium: 4.6 mmol/L (ref 3.5–5.1)
Sodium: 135 mmol/L (ref 135–145)

## 2022-12-08 LAB — GLUCOSE, CAPILLARY
Glucose-Capillary: 104 mg/dL — ABNORMAL HIGH (ref 70–99)
Glucose-Capillary: 116 mg/dL — ABNORMAL HIGH (ref 70–99)
Glucose-Capillary: 251 mg/dL — ABNORMAL HIGH (ref 70–99)
Glucose-Capillary: 251 mg/dL — ABNORMAL HIGH (ref 70–99)
Glucose-Capillary: 151 mg/dL — ABNORMAL HIGH (ref 70–99)

## 2022-12-08 NOTE — Progress Notes (Signed)
[facility]  PROGRESS NOTE  [name] ZOX:[id number] DOB: 09/08/1950 DOA: 11/28/2022 PCP: [name], MD   Brief HPI:   72 year old female with history of chronic hypoxemic respiratory failure, pulmonary fibrosis, A-fib on Eliquis, hypertension, anxiety, chronic pain presented with cough, shortness of breath.  CTA chest showed diffuse patchy opacity on background of fibrosis, suspicious for atypical infection.    Patient admitted with sepsis due to community-acquired pneumonia, acute on chronic hypoxemic respiratory failure in the setting of pulmonary fibrosis.    Subjective   Patient seen and examined, breathing has improved.  Currently requiring 8 L/min HFNC   Assessment/Plan:    Sepsis due to community-acquired pneumonia -Presented with fever, tachypnea, tachycardia, pneumonia -Acute on chronic hypoxemic respiratory failure/pulmonary fibrosis; 2 L oxygen at baseline -Currently requiring 8 L/min HFNC COVID, influenza, RSV negative -Respiratory virus panel negative -Steroid taper per pulmonology, s/p 5 days of antibiotics with ceftriaxone/doxycycline -Diuresed with Lasix 40 mg IV every 12 hours, EF 60 to 65% with grade 1 diastolic dysfunction as per echo -BUN is  rising, so  discontinued IV Lasix and started Lasix 40 mg p.o. twice daily. -Kidney function was worsening so Lasix was changed to 40 mg p.o. daily.   Net -7.9 L   Asymptomatic bacteriuria -Proteus mirabilis on urine culture, asymptomatic -Completed antibiotics with ceftriaxone for pneumonia as above  Diabetes mellitus type 2 -Hemoglobin A1c 6.6, continue sliding scale insulin NovoLog -Insulin detemir 5 units subcu twice daily; CBG well-controlled  Atrial fibrillation -Continue Eliquis, Tikosyn, diltiazem -Heart rate controlled  Obstructive sleep apnea -Continue BiPAP nightly  Hypertension -Continue diltiazem, irbesartan  Chronic pain/anxiety -Continue Norco, Ativan  Hemoptysis -Had scant  hemoptysis, no PE on CTA chest -Likely in setting of atypical infection pulmonary fibrosis -Eliquis was held, has been resumed  Thoracic aortic ectasia -Noted on CTA chest in the ED -Follow CT surgery as outpatient as planned  Peer to peer discussion with insurance physician done yesterday, patient denied to go to LTAC   Medications     apixaban  5 mg Oral BID   atorvastatin  40 mg Oral Daily   brimonidine  1 drop Both Eyes BID   diltiazem  240 mg Oral Daily   dofetilide  500 mcg Oral BID   furosemide  40 mg Oral Daily   insulin aspart  0-15 Units Subcutaneous TID WC   insulin aspart  0-5 Units Subcutaneous QHS   insulin detemir  5 Units Subcutaneous BID   irbesartan  300 mg Oral Daily   potassium chloride SA  10 mEq Oral Daily   predniSONE  40 mg Oral Q breakfast   Followed by   Melene Muller ON 12/10/2022] predniSONE  20 mg Oral Q breakfast   sodium chloride flush  3 mL Intravenous Q12H     Data Reviewed:   CBG:  Recent Labs  Lab 12/07/22 1123 12/07/22 1458 12/07/22 1710 12/07/22 2101 12/08/22 0651  GLUCAP 223* 250* 131* 175* 104*    SpO2: 95 % O2 Flow Rate (L/min): (S) 8 L/min FiO2 (%): 50 %    Vitals:   12/08/22 0310 12/08/22 0500 12/08/22 0730 12/08/22 0835  BP: 108/67  (!) 106/53   Pulse: (!) 55  66 74  Resp: 20  20 (!) 24  Temp: (!) 97.5 F (36.4 C)  98.1 F (36.7 C)   TempSrc: Oral  Oral   SpO2: 98%  95% 95%  Weight:  88.1 kg    Height:  Data Reviewed:  Basic Metabolic Panel: Recent Labs  Lab 12/02/22 0317 12/03/22 0423 12/04/22 0257 12/06/22 0248 12/07/22 0038 12/08/22 0228  NA 141 139 139 134* 135 135  K 4.1 3.2* 3.9 3.9 3.9 4.6  CL 102 100 100 93* 96* 95*  CO2 29 27 30  32 33* 33*  GLUCOSE 178* 200* 137* 76 127* 155*  BUN 37* 41* 43* 43* 42* 41*  CREATININE 0.97 0.96 0.95 1.12* 1.19* 1.14*  CALCIUM 8.6* 8.7* 9.0 8.7* 8.7* 8.6*  MG 2.0 2.0 2.2  --   --   --   PHOS 4.0 4.1 3.5  --   --   --     CBC: Recent Labs  Lab  12/02/22 0317 12/03/22 0423 12/04/22 0257 12/06/22 0248  WBC 12.8* 12.0* 12.5* 15.7*  NEUTROABS 11.6* 10.4* 10.1*  --   HGB 12.9 14.0 14.2 14.5  HCT 40.5 42.3 42.7 43.5  MCV 91.6 89.6 89.3 89.3  PLT 227 221 222 221    LFT Recent Labs  Lab 12/03/22 0423 12/04/22 0257  AST 22 28  ALT 22 34  ALKPHOS 80 70  BILITOT 0.6 1.1  PROT 6.4* 6.0*  ALBUMIN 2.6* 2.6*     Antibiotics: Anti-infectives (From admission, onward)    Start     Dose/Rate Route Frequency Ordered Stop   11/29/22 1800  vancomycin (VANCOREADY) IVPB 750 mg/150 mL  Status:  Discontinued        750 mg 150 mL/hr over 60 Minutes Intravenous Every 24 hours 11/28/22 1844 11/28/22 2031   11/29/22 0600  ceFEPIme (MAXIPIME) 2 g in sodium chloride 0.9 % 100 mL IVPB  Status:  Discontinued        2 g 200 mL/hr over 30 Minutes Intravenous Every 12 hours 11/28/22 1844 11/28/22 2031   11/28/22 2300  cefTRIAXone (ROCEPHIN) 2 g in sodium chloride 0.9 % 100 mL IVPB        2 g 200 mL/hr over 30 Minutes Intravenous Every 24 hours 11/28/22 2031 12/03/22 0700   11/28/22 2200  doxycycline (VIBRAMYCIN) 100 mg in sodium chloride 0.9 % 250 mL IVPB        100 mg 125 mL/hr over 120 Minutes Intravenous Every 12 hours 11/28/22 2048 12/03/22 1121   11/28/22 2045  azithromycin (ZITHROMAX) 500 mg in sodium chloride 0.9 % 250 mL IVPB  Status:  Discontinued        500 mg 250 mL/hr over 60 Minutes Intravenous Every 24 hours 11/28/22 2031 11/28/22 2048   11/28/22 1600  ceFEPIme (MAXIPIME) 2 g in sodium chloride 0.9 % 100 mL IVPB        2 g 200 mL/hr over 30 Minutes Intravenous  Once 11/28/22 1547 11/28/22 1806   11/28/22 1600  metroNIDAZOLE (FLAGYL) IVPB 500 mg        500 mg 100 mL/hr over 60 Minutes Intravenous  Once 11/28/22 1547 11/28/22 1723   11/28/22 1600  vancomycin (VANCOCIN) IVPB 1000 mg/200 mL premix  Status:  Discontinued        1,000 mg 200 mL/hr over 60 Minutes Intravenous  Once 11/28/22 1547 11/28/22 1550   11/28/22 1600   vancomycin (VANCOREADY) IVPB 1750 mg/350 mL        1,750 mg 175 mL/hr over 120 Minutes Intravenous  Once 11/28/22 1550 11/28/22 2212        DVT prophylaxis: Apixaban  Code Status: Full code  Family Communication:    CONSULTS pulmonology   Objective    Physical Examination:  General-appears  in no acute distress Heart-S1-S2, regular, no murmur auscultated Lungs-decreased breath sounds at lung bases Abdomen-soft, nontender, no organomegaly Extremities-no edema in the lower extremities Neuro-alert, oriented x3, no focal deficit noted  Status is: Inpatient:             Meredeth Ide   [facility] If 7PM-7AM, please contact night-coverage at [email or web address], Office  [phone]   12/08/2022, 8:51 AM  LOS: 10 days

## 2022-12-09 ENCOUNTER — Telehealth: Payer: Self-pay | Admitting: Physician Assistant

## 2022-12-09 ENCOUNTER — Inpatient Hospital Stay (HOSPITAL_COMMUNITY): Payer: Medicare Other

## 2022-12-09 DIAGNOSIS — I5032 Chronic diastolic (congestive) heart failure: Secondary | ICD-10-CM | POA: Diagnosis not present

## 2022-12-09 DIAGNOSIS — J9601 Acute respiratory failure with hypoxia: Secondary | ICD-10-CM | POA: Diagnosis not present

## 2022-12-09 DIAGNOSIS — J849 Interstitial pulmonary disease, unspecified: Secondary | ICD-10-CM | POA: Diagnosis not present

## 2022-12-09 DIAGNOSIS — J189 Pneumonia, unspecified organism: Secondary | ICD-10-CM | POA: Diagnosis not present

## 2022-12-09 DIAGNOSIS — G894 Chronic pain syndrome: Secondary | ICD-10-CM | POA: Diagnosis not present

## 2022-12-09 DIAGNOSIS — J9621 Acute and chronic respiratory failure with hypoxia: Secondary | ICD-10-CM | POA: Diagnosis not present

## 2022-12-09 LAB — GLUCOSE, CAPILLARY
Glucose-Capillary: 82 mg/dL (ref 70–99)
Glucose-Capillary: 136 mg/dL — ABNORMAL HIGH (ref 70–99)
Glucose-Capillary: 165 mg/dL — ABNORMAL HIGH (ref 70–99)
Glucose-Capillary: 252 mg/dL — ABNORMAL HIGH (ref 70–99)
Glucose-Capillary: 179 mg/dL — ABNORMAL HIGH (ref 70–99)

## 2022-12-09 LAB — BASIC METABOLIC PANEL
Anion gap: 9 (ref 5–15)
BUN: 42 mg/dL — ABNORMAL HIGH (ref 8–23)
CO2: 30 mmol/L (ref 22–32)
Calcium: 8.8 mg/dL — ABNORMAL LOW (ref 8.9–10.3)
Chloride: 98 mmol/L (ref 98–111)
Creatinine, Ser: 1.17 mg/dL — ABNORMAL HIGH (ref 0.44–1.00)
GFR, Estimated: 50 mL/min — ABNORMAL LOW (ref >60)
Glucose, Bld: 89 mg/dL (ref 70–99)
Potassium: 4.5 mmol/L (ref 3.5–5.1)
Sodium: 137 mmol/L (ref 135–145)

## 2022-12-09 LAB — CBC
HCT: 42 % (ref 36.0–46.0)
Hemoglobin: 13.4 g/dL (ref 12.0–15.0)
MCH: 29.3 pg (ref 26.0–34.0)
MCHC: 31.9 g/dL (ref 30.0–36.0)
MCV: 91.9 fL (ref 80.0–100.0)
Platelets: 207 10*3/uL (ref 150–400)
RBC: 4.57 MIL/uL (ref 3.87–5.11)
RDW: 14.2 % (ref 11.5–15.5)
WBC: 16 10*3/uL — ABNORMAL HIGH (ref 4.0–10.5)
nRBC: 0 % (ref 0.0–0.2)

## 2022-12-09 NOTE — Progress Notes (Signed)
[facility]  PROGRESS NOTE  MISTEY FIMBRES EFE:[id number] DOB: 18-Aug-1951 DOA: 11/28/2022 PCP: [name], MD   Brief HPI:   72 year old female with history of chronic hypoxemic respiratory failure, pulmonary fibrosis, A-fib on Eliquis, hypertension, anxiety, chronic pain presented with cough, shortness of breath.  CTA chest showed diffuse patchy opacity on background of fibrosis, suspicious for atypical infection.    Patient admitted with sepsis due to community-acquired pneumonia, acute on chronic hypoxemic respiratory failure in the setting of pulmonary fibrosis.    Subjective   Breathing has improved, oxygen is being weaned off.  Now requiring 6 L oxygen via nasal cannula.   Assessment/Plan:    Sepsis due to community-acquired pneumonia -Presented with fever, tachypnea, tachycardia, pneumonia -Acute on chronic hypoxemic respiratory failure/pulmonary fibrosis; 2 L oxygen at baseline -Currently requiring 8 L/min HFNC COVID, influenza, RSV negative -Respiratory virus panel negative -Steroid taper per pulmonology, s/p 5 days of antibiotics with ceftriaxone/doxycycline -Diuresed with Lasix 40 mg IV every 12 hours, EF 60 to 65% with grade 1 diastolic dysfunction as per echo -BUN is  rising, so  discontinued IV Lasix and started Lasix 40 mg p.o. twice daily. -Kidney function was worsening so Lasix was changed to 40 mg p.o. daily.   Net -7.3 L   Asymptomatic bacteriuria -Proteus mirabilis on urine culture, asymptomatic -Completed antibiotics with ceftriaxone for pneumonia as above  Diabetes mellitus type 2 -Hemoglobin A1c 6.6, continue sliding scale insulin NovoLog -Insulin detemir 5 units subcu twice daily; CBG well-controlled  Atrial fibrillation -Continue Eliquis, Tikosyn, diltiazem -Heart rate controlled  Obstructive sleep apnea -Continue BiPAP nightly  Hypertension -Continue diltiazem, irbesartan  Chronic pain/anxiety -Continue Norco,  Ativan  Hemoptysis -Had scant hemoptysis, no PE on CTA chest -Likely in setting of atypical infection pulmonary fibrosis -Eliquis was held, has been resumed  Thoracic aortic ectasia -Noted on CTA chest in the ED -Follow CT surgery as outpatient as planned  Peer to peer discussion with insurance physician done yesterday, patient denied to go to LTAC   Medications     apixaban  5 mg Oral BID   atorvastatin  40 mg Oral Daily   brimonidine  1 drop Both Eyes BID   diltiazem  240 mg Oral Daily   dofetilide  500 mcg Oral BID   furosemide  40 mg Oral Daily   insulin aspart  0-15 Units Subcutaneous TID WC   insulin aspart  0-5 Units Subcutaneous QHS   insulin detemir  5 Units Subcutaneous BID   irbesartan  300 mg Oral Daily   potassium chloride SA  10 mEq Oral Daily   [START ON 12/10/2022] predniSONE  20 mg Oral Q breakfast   sodium chloride flush  3 mL Intravenous Q12H     Data Reviewed:   CBG:  Recent Labs  Lab 12/08/22 1128 12/08/22 1601 12/08/22 1939 12/08/22 2105 12/09/22 0651  GLUCAP 116* 251* 251* 151* 82    SpO2: 93 % O2 Flow Rate (L/min): 6 L/min FiO2 (%): 50 %    Vitals:   12/08/22 1942 12/08/22 2306 12/09/22 0330 12/09/22 0745  BP: 120/60 98/60 119/68 (!) 112/58  Pulse:   62 70  Resp:  20 20 (!) 26  Temp: 97.7 F (36.5 C) 97.6 F (36.4 C) (!) 97.4 F (36.3 C) 97.7 F (36.5 C)  TempSrc: Oral Oral Oral Oral  SpO2: 94%  96% 93%  Weight:   86.4 kg   Height:          Data Reviewed:  Basic Metabolic Panel: Recent Labs  Lab 12/03/22 0423 12/04/22 0257 12/06/22 0248 12/07/22 0038 12/08/22 0228 12/09/22 0253  NA 139 139 134* 135 135 137  K 3.2* 3.9 3.9 3.9 4.6 4.5  CL 100 100 93* 96* 95* 98  CO2 27 30 32 33* 33* 30  GLUCOSE 200* 137* 76 127* 155* 89  BUN 41* 43* 43* 42* 41* 42*  CREATININE 0.96 0.95 1.12* 1.19* 1.14* 1.17*  CALCIUM 8.7* 9.0 8.7* 8.7* 8.6* 8.8*  MG 2.0 2.2  --   --   --   --   PHOS 4.1 3.5  --   --   --   --      CBC: Recent Labs  Lab 12/03/22 0423 12/04/22 0257 12/06/22 0248 12/09/22 0253  WBC 12.0* 12.5* 15.7* 16.0*  NEUTROABS 10.4* 10.1*  --   --   HGB 14.0 14.2 14.5 13.4  HCT 42.3 42.7 43.5 42.0  MCV 89.6 89.3 89.3 91.9  PLT 221 222 221 207    LFT Recent Labs  Lab 12/03/22 0423 12/04/22 0257  AST 22 28  ALT 22 34  ALKPHOS 80 70  BILITOT 0.6 1.1  PROT 6.4* 6.0*  ALBUMIN 2.6* 2.6*     Antibiotics: Anti-infectives (From admission, onward)    Start     Dose/Rate Route Frequency Ordered Stop   11/29/22 1800  vancomycin (VANCOREADY) IVPB 750 mg/150 mL  Status:  Discontinued        750 mg 150 mL/hr over 60 Minutes Intravenous Every 24 hours 11/28/22 1844 11/28/22 2031   11/29/22 0600  ceFEPIme (MAXIPIME) 2 g in sodium chloride 0.9 % 100 mL IVPB  Status:  Discontinued        2 g 200 mL/hr over 30 Minutes Intravenous Every 12 hours 11/28/22 1844 11/28/22 2031   11/28/22 2300  cefTRIAXone (ROCEPHIN) 2 g in sodium chloride 0.9 % 100 mL IVPB        2 g 200 mL/hr over 30 Minutes Intravenous Every 24 hours 11/28/22 2031 12/03/22 0700   11/28/22 2200  doxycycline (VIBRAMYCIN) 100 mg in sodium chloride 0.9 % 250 mL IVPB        100 mg 125 mL/hr over 120 Minutes Intravenous Every 12 hours 11/28/22 2048 12/03/22 1121   11/28/22 2045  azithromycin (ZITHROMAX) 500 mg in sodium chloride 0.9 % 250 mL IVPB  Status:  Discontinued        500 mg 250 mL/hr over 60 Minutes Intravenous Every 24 hours 11/28/22 2031 11/28/22 2048   11/28/22 1600  ceFEPIme (MAXIPIME) 2 g in sodium chloride 0.9 % 100 mL IVPB        2 g 200 mL/hr over 30 Minutes Intravenous  Once 11/28/22 1547 11/28/22 1806   11/28/22 1600  metroNIDAZOLE (FLAGYL) IVPB 500 mg        500 mg 100 mL/hr over 60 Minutes Intravenous  Once 11/28/22 1547 11/28/22 1723   11/28/22 1600  vancomycin (VANCOCIN) IVPB 1000 mg/200 mL premix  Status:  Discontinued        1,000 mg 200 mL/hr over 60 Minutes Intravenous  Once 11/28/22 1547 11/28/22  1550   11/28/22 1600  vancomycin (VANCOREADY) IVPB 1750 mg/350 mL        1,750 mg 175 mL/hr over 120 Minutes Intravenous  Once 11/28/22 1550 11/28/22 2212        DVT prophylaxis: Apixaban  Code Status: Full code  Family Communication:    CONSULTS pulmonology   Objective    Physical  Examination:  General-appears in no acute distress Heart-S1-S2, regular, no murmur auscultated Lungs-clear to auscultation bilaterally, no wheezing or crackles auscultated Abdomen-soft, nontender, no organomegaly Extremities-no edema in the lower extremities Neuro-alert, oriented x3, no focal deficit noted  Status is: Inpatient:             Meredeth Ide   [facility] If 7PM-7AM, please contact night-coverage at [email or web address], Office  [phone]   12/09/2022, 8:50 AM  LOS: 11 days

## 2022-12-09 NOTE — Progress Notes (Signed)
Physical Therapy Treatment Patient Details Name: [name] MRN: [id number] DOB: [birth year] Today's Date: 12/09/2022   History of Present Illness The pt is a 72 yo female presenting 4/4 with hemoptysis and hypoxia to 51% on 2L O2. Pt found to have PNA with pulmonary fibrosis. Started HHFNC 4/6. PMH includes: CHF, emphysema, HTN, OA, afib, and chronic pain.    PT Comments    Patient seen with focus on planning for home d/c.  She reports will have help throughout the day with various family members and they are working it out. She states has ramp for home entry and needs equipment.  She has O2 concentrator that only gues up to 5L.  MD aware she may need new concentrator versus to stay until managing on less O2.  Patient able to practice managing the rollator as well.  On 6L portable O2 noted SpO2 95% at rest, down to 77% though poor wave form and quickly back to 91% with seated rest and improvement in waveform.  Patient denied LTACH so she prefers home with family support so recommending HHPT at d/c.  PT will continue to follow.   Recommendations for follow up therapy are one component of a multi-disciplinary discharge planning process, led by the attending physician.  Recommendations may be updated based on patient status, additional functional criteria and insurance authorization.  Follow Up Recommendations       Assistance Recommended at Discharge Intermittent Supervision/Assistance  Patient can return home with the following A little help with walking and/or transfers;Assistance with cooking/housework;Assist for transportation;Help with stairs or ramp for entrance   Equipment Recommendations  Rollator (4 wheels);BSC/3in1    Recommendations for Other Services       Precautions / Restrictions Precautions Precautions: Fall Precaution Comments: Monitor O2     Mobility  Bed Mobility Overal bed mobility: Modified Independent                  Transfers Overall transfer  level: Needs assistance   Transfers: Sit to/from Stand, Bed to chair/wheelchair/BSC Sit to Stand: Supervision   Step pivot transfers: Supervision       General transfer comment: on BSC when PT entered, S for lines to step back to bed and pt performing toilet hygiene with set up    Ambulation/Gait Ambulation/Gait assistance: Supervision Gait Distance (Feet): 50 Feet (x 4) Assistive device: Rollator (4 wheels) Gait Pattern/deviations: Step-through pattern, Decreased stride length       General Gait Details: on 6L O2 [place] using rollator for demonstration/practice with locking and unlocking brakes and seated rest in hallway then sat to rest in lobby then back to room with one seated rest along the way.   Stairs             Wheelchair Mobility    Modified Rankin (Stroke Patients Only)       Balance Overall balance assessment: Needs assistance   Sitting balance-Leahy Scale: Good       Standing balance-Leahy Scale: Fair Standing balance comment: needs UE support for ambulation, no UE support when transition BSC to bed                            Cognition Arousal/Alertness: Awake/alert Behavior During Therapy: WFL for tasks assessed/performed Overall Cognitive Status: Within Functional Limits for tasks assessed  Exercises      General Comments General comments (skin integrity, edema, etc.): SpO2 95% initially on 6L O2, drops to 77% initially with ambulation but poor waveform, quickly back to 91% with seated rest and improved waveform,  Pt feeling SOB as well prompting seated rest; next walk down as low at 81% again with poor pleth so unsure of actual reading when walking, pt is symptomatic and stops to rest with RR up to 37 at one point      Pertinent Vitals/Pain Pain Assessment Pain Assessment: No/denies pain    Home Living                          Prior Function             PT Goals (current goals can now be found in the care plan section) Progress towards PT goals: Progressing toward goals    Frequency    Min 3X/week      PT Plan Discharge plan needs to be updated;Equipment recommendations need to be updated    Co-evaluation              AM-PAC PT "6 Clicks" Mobility   Outcome Measure  Help needed turning from your back to your side while in a flat bed without using bedrails?: None Help needed moving from lying on your back to sitting on the side of a flat bed without using bedrails?: None Help needed moving to and from a bed to a chair (including a wheelchair)?: None Help needed standing up from a chair using your arms (e.g., wheelchair or bedside chair)?: None Help needed to walk in hospital room?: A Little Help needed climbing 3-5 steps with a railing? : Total 6 Click Score: 20    End of Session Equipment Utilized During Treatment: Oxygen Activity Tolerance: Patient tolerated treatment well Patient left: in chair;with call bell/phone within reach   PT Visit Diagnosis: Unsteadiness on feet (R26.81);Difficulty in walking, not elsewhere classified (R26.2)     Time: 7282-0601 PT Time Calculation (min) (ACUTE ONLY): 29 min  Charges:  $Gait Training: 23-37 mins                     Sheran Lawless, PT Acute Rehabilitation Services Office:[phone] 12/09/2022    [name] 12/09/2022, 11:52 AM

## 2022-12-09 NOTE — Progress Notes (Signed)
   NAME:  [name], MRN:  [id number], DOB:  [birth year], LOS: 11 ADMISSION DATE:  11/28/2022, CONSULTATION DATE: 11/29/2022 REFERRING MD: [facility], CHIEF COMPLAINT: Acute on chronic hypoxic respiratory with suspected pneumonia  History of Present Illness:  72 year old female who is chronically O2 dependent at 2 to 3 L nasal cannula daily basis.  She notes fever purulent sputum with blood-tinged dyspnea on exertion and increasing O2 needs while at home.  CT scan is negative for PE but does show nonspecific pulmonary fibrosis, groundglass opacity.  Pulmonary critical care asked to evaluate.  She is followed by Dr. Sandrea Hughs of pulmonary.  Pertinent  Medical History   Past Medical History:  Diagnosis Date   Chronic diastolic CHF (congestive heart failure)    Chronic respiratory failure    Coronary atherosclerosis    a. 2V by CT 10/2017.   Emphysema of lung    Hypercholesterolemia    Hypertension    NSIP (nonspecific interstitial pneumonia)    Osteoarthritis    Persistent atrial fibrillation    Thoracic aortic ectasia      Significant Hospital Events: Including procedures, antibiotic start and stop dates in addition to other pertinent events   4/8 Seen sitting up in bedside recline on 30L HHFNC  Interim History / Subjective:   States her breathing feels good, about her baseline On 6L [place] w/ sats 98%  Objective   Blood pressure (!) 112/58, pulse 70, temperature 97.7 F (36.5 C), temperature source Oral, resp. rate (!) 26, height 5' (1.524 m), weight 86.4 kg, SpO2 93 %.    FiO2 (%):  [50 %] 50 %   Intake/Output Summary (Last 24 hours) at 12/09/2022 0973 Last data filed at 12/08/2022 2147 Gross per 24 hour  Intake 843 ml  Output --  Net 843 ml    Filed Weights   12/07/22 0500 12/08/22 0500 12/09/22 0330  Weight: 88.1 kg 88.1 kg 86.4 kg    Examination: General:  NAD HEENT: MM pink/moist; [place] in place Neuro: Aox3; MAE CV: s1s2, no m/r/g PULM:  dim clear BS bilaterally; [place]  6l GI: soft, bsx4 active  Extremities: warm/dry, no edema  Skin: no rashes or lesions   Resolved Hospital Problem list     Assessment & Plan:  Acute on chronic hypoxic respiratory failure 2/2  -Primary pulmonologist Dr. Sherene Sires, normally on 2-3 liters CAP vs ILD flair (h/o NSIP)  c/b pulmonary edema and hemoptysis  Leukocytosis - improved  H/o obstructive sleep apnea   -RVP neg, PCT reassuring P: -wean [place] for sats >90% -cont steroid taper -cont home lasix -pulm toiletry: IS/Flutter -PT/OOB as tolerated -will make f/u appt with Dr. Sherene Sires or available APP in 2 weeks    JD Anselm [facility] 12/09/2022, 9:28 AM  Please see [email or web address] for pager details.  From 7A-7P if no response, please call [phone]. After hours, please call ELink [phone].

## 2022-12-09 NOTE — Care Management Important Message (Signed)
Important Message  Patient Details  Name: [name] MRN: [id number] Date of Birth: [birth year]   Medicare Important Message Given:  Yes     Sherilyn Banker 12/09/2022, 11:41 AM

## 2022-12-10 DIAGNOSIS — J9621 Acute and chronic respiratory failure with hypoxia: Secondary | ICD-10-CM | POA: Diagnosis not present

## 2022-12-10 DIAGNOSIS — J849 Interstitial pulmonary disease, unspecified: Secondary | ICD-10-CM | POA: Diagnosis not present

## 2022-12-10 DIAGNOSIS — I5032 Chronic diastolic (congestive) heart failure: Secondary | ICD-10-CM | POA: Diagnosis not present

## 2022-12-10 DIAGNOSIS — J9601 Acute respiratory failure with hypoxia: Secondary | ICD-10-CM | POA: Diagnosis not present

## 2022-12-10 LAB — GLUCOSE, CAPILLARY
Glucose-Capillary: 90 mg/dL (ref 70–99)
Glucose-Capillary: 115 mg/dL — ABNORMAL HIGH (ref 70–99)
Glucose-Capillary: 201 mg/dL — ABNORMAL HIGH (ref 70–99)
Glucose-Capillary: 370 mg/dL — ABNORMAL HIGH (ref 70–99)
Glucose-Capillary: 165 mg/dL — ABNORMAL HIGH (ref 70–99)

## 2022-12-10 LAB — BASIC METABOLIC PANEL
Anion gap: 7 (ref 5–15)
BUN: 32 mg/dL — ABNORMAL HIGH (ref 8–23)
CO2: 31 mmol/L (ref 22–32)
Calcium: 9.1 mg/dL (ref 8.9–10.3)
Chloride: 97 mmol/L — ABNORMAL LOW (ref 98–111)
Creatinine, Ser: 1.04 mg/dL — ABNORMAL HIGH (ref 0.44–1.00)
GFR, Estimated: 57 mL/min — ABNORMAL LOW (ref >60)
Glucose, Bld: 109 mg/dL — ABNORMAL HIGH (ref 70–99)
Potassium: 4.3 mmol/L (ref 3.5–5.1)
Sodium: 135 mmol/L (ref 135–145)

## 2022-12-10 MED ORDER — FUROSEMIDE 40 MG PO TABS
40.0000 mg | ORAL_TABLET | Freq: Two times a day (BID) | ORAL | Status: DC
  Administered 2022-12-10 – 2022-12-11 (×3): 40 mg via ORAL
  Filled 2022-12-10 (×3): qty 1

## 2022-12-10 NOTE — Progress Notes (Signed)
Physical Therapy Treatment Patient Details Name: [name] MRN: [id number] DOB: 1951-07-15 Today's Date: 12/10/2022   History of Present Illness The pt is a 72 yo female presenting 4/4 with hemoptysis and hypoxia to 51% on 2L O2. Pt found to have PNA with pulmonary fibrosis. Started HHFNC 4/6. PMH includes: CHF, emphysema, HTN, OA, afib, and chronic pain.    PT Comments    Patient maintaining SpO2 > 90% on 6L portable O2 during ambulation with probe placed on her finger.  On 5L at rest, but tank has only 4L then 6L option.  She was able to perform transfers on her own and self monitors taking standing rest breaks when needed with RR up to 39 at the most.  PT will continue to follow.  Anticipate HHPT needs at d/c.   Recommendations for follow up therapy are one component of a multi-disciplinary discharge planning process, led by the attending physician.  Recommendations may be updated based on patient status, additional functional criteria and insurance authorization.  Follow Up Recommendations       Assistance Recommended at Discharge Intermittent Supervision/Assistance  Patient can return home with the following A little help with walking and/or transfers;Assistance with cooking/housework;Assist for transportation;Help with stairs or ramp for entrance   Equipment Recommendations  Rollator (4 wheels);BSC/3in1    Recommendations for Other Services       Precautions / Restrictions Precautions Precautions: Fall Precaution Comments: Monitor O2     Mobility  Bed Mobility Overal bed mobility: Modified Independent                  Transfers Overall transfer level: Modified independent Equipment used: Rolling walker (2 wheels) Transfers: Sit to/from Stand Sit to Stand: Modified independent (Device/Increase time)           General transfer comment: no assist up from EOB, using BSC on her own in the room    Ambulation/Gait Ambulation/Gait assistance:  Supervision Gait Distance (Feet): 120 Feet (& 60' x 2) Assistive device: Rolling walker (2 wheels) Gait Pattern/deviations: Step-through pattern, Decreased stride length       General Gait Details: with S for safety and pt stopping to rest to breathe several times, two seated rest breaks then walked all the way back from lobby to her room.   Stairs             Wheelchair Mobility    Modified Rankin (Stroke Patients Only)       Balance Overall balance assessment: Needs assistance   Sitting balance-Leahy Scale: Good       Standing balance-Leahy Scale: Fair                              Cognition Arousal/Alertness: Awake/alert Behavior During Therapy: WFL for tasks assessed/performed Overall Cognitive Status: Within Functional Limits for tasks assessed                                          Exercises      General Comments General comments (skin integrity, edema, etc.): SpO2 on 6L portable O2 with proble placed on her finger this session >/=90% throughout.  on 5L O2 at rest; RR up to 39 max      Pertinent Vitals/Pain Pain Assessment Pain Assessment: No/denies pain    Home Living  Prior Function            PT Goals (current goals can now be found in the care plan section) Progress towards PT goals: Progressing toward goals    Frequency    Min 3X/week      PT Plan Current plan remains appropriate    Co-evaluation              AM-PAC PT "6 Clicks" Mobility   Outcome Measure  Help needed turning from your back to your side while in a flat bed without using bedrails?: None Help needed moving from lying on your back to sitting on the side of a flat bed without using bedrails?: None Help needed moving to and from a bed to a chair (including a wheelchair)?: None Help needed standing up from a chair using your arms (e.g., wheelchair or bedside chair)?: None Help needed to walk in  hospital room?: A Little Help needed climbing 3-5 steps with a railing? : Total 6 Click Score: 20    End of Session Equipment Utilized During Treatment: Gait belt;Oxygen Activity Tolerance: Patient tolerated treatment well Patient left: in bed;with call bell/phone within reach   PT Visit Diagnosis: Unsteadiness on feet (R26.81);Difficulty in walking, not elsewhere classified (R26.2)     Time: 4008-6761 PT Time Calculation (min) (ACUTE ONLY): 24 min  Charges:  $Gait Training: 8-22 mins $Therapeutic Activity: 8-22 mins                     [name], PT Acute Rehabilitation Services Office:[phone] 12/10/2022    [name] 12/10/2022, 4:55 PM

## 2022-12-10 NOTE — Progress Notes (Addendum)
[facility]  PROGRESS NOTE  Toni DEJOY WUJ:[id number] DOB: 05-20-1951 DOA: 11/28/2022 PCP: [name], MD   Brief HPI:   72 year old female with history of chronic hypoxemic respiratory failure, pulmonary fibrosis, A-fib on Eliquis, hypertension, anxiety, chronic pain presented with cough, shortness of breath.  CTA chest showed diffuse patchy opacity on background of fibrosis, suspicious for atypical infection.    Patient admitted with sepsis due to community-acquired pneumonia, acute on chronic hypoxemic respiratory failure in the setting of pulmonary fibrosis.    Subjective   Patient seen, complains of mild shortness of breath.  Dose of Lasix was changed to 40 mg daily.  Diuresis has slowed down since changing Lasix.   Assessment/Plan:    Sepsis due to community-acquired pneumonia -Presented with fever, tachypnea, tachycardia, pneumonia -Acute on chronic hypoxemic respiratory failure/pulmonary fibrosis; 2-4 L oxygen at baseline -She was  requiring 8-20 L/min HFNC COVID, influenza, RSV negative -Oxygen has been weaned off to 5 L/min via nasal cannula -Respiratory virus panel negative -Steroid taper per pulmonology, s/p 5 days of antibiotics with ceftriaxone/doxycycline -Diuresed with Lasix 40 mg IV every 12 hours, EF 60 to 65% with grade 1 diastolic dysfunction as per echo -BUN is  rising, so  discontinued IV Lasix and started Lasix 40 mg p.o. twice daily. -Kidney function was worsening so Lasix was changed to 40 mg p.o. daily.   Net -6.8 L -Will change the dose of Lasix to 40 mg p.o. twice daily.    As per PCCM Maintain 20 mg of prednisone for another week, but not taper until hypoxia back to baseline and then can start tapering by 5 to 10 mg/week -Her home concentrator only goes up to 5 L still will need to be at that level before discharge  Asymptomatic bacteriuria -Proteus mirabilis on urine culture, asymptomatic -Completed antibiotics with ceftriaxone for  pneumonia as above  Acute on chronic diastolic heart failure -Echocardiogram from 12/01/2022 showed EF 65% with grade 1 diastolic dysfunction -Dose of Lasix changed to 40 mg p.o. twice daily as above -Patient takes Lasix 40 mg daily at home -Dose of Lasix may need to be increased at discharge  Diabetes mellitus type 2 -Hemoglobin A1c 6.6, continue sliding scale insulin NovoLog -Insulin detemir 5 units subcu twice daily; CBG well-controlled  Atrial fibrillation -Continue Eliquis, Tikosyn, diltiazem -Heart rate controlled  Obstructive sleep apnea -Continue BiPAP nightly  Hypertension -Continue diltiazem, irbesartan  Chronic pain/anxiety -Continue Norco, Ativan  Hemoptysis -Had scant hemoptysis, no PE on CTA chest -Likely in setting of atypical infection pulmonary fibrosis -Eliquis was held, has been resumed  Thoracic aortic ectasia -Noted on CTA chest in the ED -Follow CT surgery as outpatient as planned  Peer to peer discussion with insurance physician done yesterday, patient denied to go to LTAC   Medications     apixaban  5 mg Oral BID   atorvastatin  40 mg Oral Daily   brimonidine  1 drop Both Eyes BID   diltiazem  240 mg Oral Daily   dofetilide  500 mcg Oral BID   furosemide  40 mg Oral Daily   insulin aspart  0-15 Units Subcutaneous TID WC   insulin aspart  0-5 Units Subcutaneous QHS   insulin detemir  5 Units Subcutaneous BID   irbesartan  300 mg Oral Daily   potassium chloride SA  10 mEq Oral Daily   predniSONE  20 mg Oral Q breakfast   sodium chloride flush  3 mL Intravenous Q12H  Data Reviewed:   CBG:  Recent Labs  Lab 12/09/22 1145 12/09/22 1512 12/09/22 1833 12/09/22 2102 12/10/22 0638  GLUCAP 136* 165* 252* 179* 90    SpO2: 93 % O2 Flow Rate (L/min): 6 L/min FiO2 (%): 50 %    Vitals:   12/09/22 2320 12/10/22 0253 12/10/22 0351 12/10/22 0755  BP: 107/77 115/73  (!) 88/75  Pulse: 65 67  66  Resp: 17 14  (!) 26  Temp: 98.1 F  (36.7 C) 97.7 F (36.5 C)  98 F (36.7 C)  TempSrc: Axillary Oral  Oral  SpO2: 95% 94%  93%  Weight:   85.5 kg   Height:          Data Reviewed:  Basic Metabolic Panel: Recent Labs  Lab 12/04/22 0257 12/06/22 0248 12/07/22 0038 12/08/22 0228 12/09/22 0253  NA 139 134* 135 135 137  K 3.9 3.9 3.9 4.6 4.5  CL 100 93* 96* 95* 98  CO2 30 32 33* 33* 30  GLUCOSE 137* 76 127* 155* 89  BUN 43* 43* 42* 41* 42*  CREATININE 0.95 1.12* 1.19* 1.14* 1.17*  CALCIUM 9.0 8.7* 8.7* 8.6* 8.8*  MG 2.2  --   --   --   --   PHOS 3.5  --   --   --   --     CBC: Recent Labs  Lab 12/04/22 0257 12/06/22 0248 12/09/22 0253  WBC 12.5* 15.7* 16.0*  NEUTROABS 10.1*  --   --   HGB 14.2 14.5 13.4  HCT 42.7 43.5 42.0  MCV 89.3 89.3 91.9  PLT 222 221 207    LFT Recent Labs  Lab 12/04/22 0257  AST 28  ALT 34  ALKPHOS 70  BILITOT 1.1  PROT 6.0*  ALBUMIN 2.6*     Antibiotics: Anti-infectives (From admission, onward)    Start     Dose/Rate Route Frequency Ordered Stop   11/29/22 1800  vancomycin (VANCOREADY) IVPB 750 mg/150 mL  Status:  Discontinued        750 mg 150 mL/hr over 60 Minutes Intravenous Every 24 hours 11/28/22 1844 11/28/22 2031   11/29/22 0600  ceFEPIme (MAXIPIME) 2 g in sodium chloride 0.9 % 100 mL IVPB  Status:  Discontinued        2 g 200 mL/hr over 30 Minutes Intravenous Every 12 hours 11/28/22 1844 11/28/22 2031   11/28/22 2300  cefTRIAXone (ROCEPHIN) 2 g in sodium chloride 0.9 % 100 mL IVPB        2 g 200 mL/hr over 30 Minutes Intravenous Every 24 hours 11/28/22 2031 12/03/22 0700   11/28/22 2200  doxycycline (VIBRAMYCIN) 100 mg in sodium chloride 0.9 % 250 mL IVPB        100 mg 125 mL/hr over 120 Minutes Intravenous Every 12 hours 11/28/22 2048 12/03/22 1121   11/28/22 2045  azithromycin (ZITHROMAX) 500 mg in sodium chloride 0.9 % 250 mL IVPB  Status:  Discontinued        500 mg 250 mL/hr over 60 Minutes Intravenous Every 24 hours 11/28/22 2031 11/28/22  2048   11/28/22 1600  ceFEPIme (MAXIPIME) 2 g in sodium chloride 0.9 % 100 mL IVPB        2 g 200 mL/hr over 30 Minutes Intravenous  Once 11/28/22 1547 11/28/22 1806   11/28/22 1600  metroNIDAZOLE (FLAGYL) IVPB 500 mg        500 mg 100 mL/hr over 60 Minutes Intravenous  Once 11/28/22 1547 11/28/22 1723  11/28/22 1600  vancomycin (VANCOCIN) IVPB 1000 mg/200 mL premix  Status:  Discontinued        1,000 mg 200 mL/hr over 60 Minutes Intravenous  Once 11/28/22 1547 11/28/22 1550   11/28/22 1600  vancomycin (VANCOREADY) IVPB 1750 mg/350 mL        1,750 mg 175 mL/hr over 120 Minutes Intravenous  Once 11/28/22 1550 11/28/22 2212        DVT prophylaxis: Apixaban  Code Status: Full code  Family Communication:    CONSULTS pulmonology   Objective    Physical Examination:  General-appears in no acute distress Heart-S1-S2, regular, no murmur auscultated Lungs-bibasilar crackles auscultated Abdomen-soft, nontender, no organomegaly Extremities-no edema in the lower extremities Neuro-alert, oriented x3, no focal deficit noted  Status is: Inpatient:             Meredeth Ide   [facility] If 7PM-7AM, please contact night-coverage at [email or web address], Office  [phone]   12/10/2022, 8:26 AM  LOS: 12 days

## 2022-12-11 DIAGNOSIS — J189 Pneumonia, unspecified organism: Secondary | ICD-10-CM | POA: Diagnosis not present

## 2022-12-11 LAB — GLUCOSE, CAPILLARY
Glucose-Capillary: 91 mg/dL (ref 70–99)
Glucose-Capillary: 111 mg/dL — ABNORMAL HIGH (ref 70–99)
Glucose-Capillary: 213 mg/dL — ABNORMAL HIGH (ref 70–99)
Glucose-Capillary: 165 mg/dL — ABNORMAL HIGH (ref 70–99)

## 2022-12-11 LAB — BASIC METABOLIC PANEL
Anion gap: 8 (ref 5–15)
BUN: 36 mg/dL — ABNORMAL HIGH (ref 8–23)
CO2: 33 mmol/L — ABNORMAL HIGH (ref 22–32)
Calcium: 9.4 mg/dL (ref 8.9–10.3)
Chloride: 97 mmol/L — ABNORMAL LOW (ref 98–111)
Creatinine, Ser: 1.3 mg/dL — ABNORMAL HIGH (ref 0.44–1.00)
GFR, Estimated: 44 mL/min — ABNORMAL LOW (ref >60)
Glucose, Bld: 82 mg/dL (ref 70–99)
Potassium: 5 mmol/L (ref 3.5–5.1)
Sodium: 138 mmol/L (ref 135–145)

## 2022-12-11 MED ORDER — INSULIN ASPART 100 UNIT/ML IJ SOLN
0.0000 [IU] | Freq: Three times a day (TID) | INTRAMUSCULAR | Status: DC
  Administered 2022-12-11: 3 [IU] via SUBCUTANEOUS
  Administered 2022-12-12: 1 [IU] via SUBCUTANEOUS

## 2022-12-11 NOTE — Inpatient Diabetes Management (Signed)
Inpatient Diabetes Program Recommendations  AACE/ADA: New Consensus Statement on Inpatient Glycemic Control (2015)  Target Ranges:  Prepandial:   less than 140 mg/dL      Peak postprandial:   less than 180 mg/dL (1-2 hours)      Critically ill patients:  140 - 180 mg/dL   Lab Results  Component Value Date   GLUCAP 111 (H) 12/11/2022   HGBA1C 6.6 (H) 12/01/2022    Latest Reference Range & Units 12/10/22 06:38 12/10/22 11:28 12/10/22 15:19 12/10/22 18:51 12/10/22 21:10 12/11/22 07:27 12/11/22 11:25  Glucose-Capillary 70 - 99 mg/dL 90 643 (H) 329 (H) 518 (H) 165 (H) 91 111 (H)  (H): Data is abnormally high  Diabetes history: No hx DM Current orders for Inpatient glycemic control: Levemir 5 units bid, Novolog 0-15 units tid correction, Prednisone 20 mg qd  Inpatient Diabetes Program Recommendations:   Please consider: Decrease Novolog correction to 0-9 units tid, 0-5 units hs  Thank you, Aoibheann E. Mindi Akerson, RN, MSN, CDE  Diabetes Coordinator Inpatient Glycemic Control Team Team Pager [phone] (8am-5pm) 12/11/2022 1:47 PM

## 2022-12-11 NOTE — Progress Notes (Signed)
PROGRESS NOTE  [name]  DOB: [birth year]  PCP: [name], MD WUJ:[id number]  DOA: 11/28/2022  LOS: 13 days  Hospital Day: 14  Brief narrative: [name] is a 72 y.o. female with PMH significant for HTN, HLD, CAD, persistent A-fib, chronic diastolic CHF, COPD, pulmonary fibrosis, chronic oxygen dependence, anxiety, chronic pain thoracic aortic ectasia, osteoarthritis 4/4, patient presented to the ED with complaint of shortness of breath, purulent blood-tinged sputum, She was noted to have fever, tachypnea, tachycardia CT chest showed diffuse patchy opacity on the background of fibrosis suspicious for pneumonia Respiratory respiratory negative for COVID, influenza, RSV Admitted to [facility] for sepsis secondary to pneumonia Pulmonary consultation was obtained.  Hospitalization was prolonged due to progressive worsening of dyspnea requiring up to 20 L of high flow oxygen by nasal cannula.  It was likely due to a combination of pneumonia as well as CHF exacerbation.  Oxygen requirement has now significantly improved with antibiotics, steroids, diuresis.   Subjective: Patient was seen and examined this morning. Pleasant elderly Caucasian female.  Sitting up in recliner. In the last 24 hours, patient is afebrile, heart rate in 60s, blood pressure in 90s and low 100s Last set of blood work from this morning with BUN/creatinine 36/1.3  Assessment and plan: Sepsis POA Community-acquired pneumonia Acute on chronic hypoxemic respiratory failure Pulmonary fibrosis Initially admitted for pneumonia.  Hospitalization got prolonged due to progressive worsening of dyspnea requiring up to 20 L of high flow oxygen by nasal cannula. Oxygen requirement gradually. S/p 5 days of antibiotics with ceftriaxone and doxycycline. Per PCCM, Maintain 20 mg of prednisone for another week, but not taper until hypoxia back to baseline and then can start tapering by 5 to 10 mg/week Her home concentrator  only goes up to 5 L still will need to be at that level before discharge  Acute on chronic diastolic heart failure Essential hypertension Echocardiogram from 12/01/2022 showed EF 65% with grade 1 diastolic dysfunction PTA patient was on Cardizem, irbesartan, Lasix 40 mg daily. Oxygenation improved after diuresis with IV Lasix.   Currently on oral Lasix 40 mg twice daily.  Creatinine acutely elevated.  Blood pressure running low this afternoon.  I would hold Lasix and irbesartan at this time.  Continue Cardizem. Net IO Since Admission: -6,894.88 mL [12/11/22 1008] Continue to monitor for daily intake output, weight, blood pressure, BNP, renal function and electrolytes. Recent Labs  Lab 12/07/22 0038 12/08/22 0228 12/09/22 0253 12/10/22 0843 12/11/22 0157  BUN 42* 41* 42* 32* 36*  CREATININE 1.19* 1.14* 1.17* 1.04* 1.30*  K 3.9 4.6 4.5 4.3 5.0   Asymptomatic bacteriuria Proteus mirabilis on urine culture, asymptomatic Completed antibiotics with ceftriaxone for pneumonia as above   Type 2 diabetes mellitus A1c 6.6 on 12/01/2022 PTA not on meds. Currently on Levemir 5 units twice daily and sliding scale insulin. Recent Labs  Lab 12/10/22 1128 12/10/22 1519 12/10/22 1851 12/10/22 2110 12/11/22 0727  GLUCAP 115* 201* 370* 165* 91   Atrial fibrillation Heart rate controlled on Tikosyn, diltiazem Chronic anticoagulation with Eliquis   Obstructive sleep apnea Continue BiPAP nightly   Chronic pain/anxiety Continue Norco, Ativan   Hemoptysis Had scant hemoptysis, no PE on CTA chest Likely in setting of atypical infection pulmonary fibrosis Eliquis was initially held, later resumed.   Thoracic aortic ectasia Noted on CTA chest in the ED Follow CT surgery as outpatient as planned    Mobility: PT eval obtained.  Home with PT recommended  Goals of care  Code Status: Full Code     DVT prophylaxis:   apixaban (ELIQUIS) tablet 5 mg   Antimicrobials: Completed the  course Fluid: None Consultants: PCCM Family Communication: None at bedside  Status: Inpatient Level of care:  Progressive   Needs to continue in-hospital care:  Pending improvement in oxygenation, blood pressure, PT eval  Patient from: Home Anticipated d/c to: Home with PT 1 to 2 days  Peer to peer discussion with insurance physician was done, patient denied to go to LTAC    Diet:  Diet Order             Diet regular Room service appropriate? Yes; Fluid consistency: Thin  Diet effective now                   Scheduled Meds:  apixaban  5 mg Oral BID   atorvastatin  40 mg Oral Daily   brimonidine  1 drop Both Eyes BID   diltiazem  240 mg Oral Daily   dofetilide  500 mcg Oral BID   furosemide  40 mg Oral BID   insulin aspart  0-15 Units Subcutaneous TID WC   insulin aspart  0-5 Units Subcutaneous QHS   insulin detemir  5 Units Subcutaneous BID   irbesartan  300 mg Oral Daily   potassium chloride SA  10 mEq Oral Daily   predniSONE  20 mg Oral Q breakfast   sodium chloride flush  3 mL Intravenous Q12H    PRN meds: sodium chloride, acetaminophen **OR** [DISCONTINUED] acetaminophen, benzonatate, HYDROcodone-acetaminophen, LORazepam, mouth rinse, polyethylene glycol, zolpidem   Infusions:   sodium chloride      Antimicrobials: Anti-infectives (From admission, onward)    Start     Dose/Rate Route Frequency Ordered Stop   11/29/22 1800  vancomycin (VANCOREADY) IVPB 750 mg/150 mL  Status:  Discontinued        750 mg 150 mL/hr over 60 Minutes Intravenous Every 24 hours 11/28/22 1844 11/28/22 2031   11/29/22 0600  ceFEPIme (MAXIPIME) 2 g in sodium chloride 0.9 % 100 mL IVPB  Status:  Discontinued        2 g 200 mL/hr over 30 Minutes Intravenous Every 12 hours 11/28/22 1844 11/28/22 2031   11/28/22 2300  cefTRIAXone (ROCEPHIN) 2 g in sodium chloride 0.9 % 100 mL IVPB        2 g 200 mL/hr over 30 Minutes Intravenous Every 24 hours 11/28/22 2031 12/03/22 0700    11/28/22 2200  doxycycline (VIBRAMYCIN) 100 mg in sodium chloride 0.9 % 250 mL IVPB        100 mg 125 mL/hr over 120 Minutes Intravenous Every 12 hours 11/28/22 2048 12/03/22 1121   11/28/22 2045  azithromycin (ZITHROMAX) 500 mg in sodium chloride 0.9 % 250 mL IVPB  Status:  Discontinued        500 mg 250 mL/hr over 60 Minutes Intravenous Every 24 hours 11/28/22 2031 11/28/22 2048   11/28/22 1600  ceFEPIme (MAXIPIME) 2 g in sodium chloride 0.9 % 100 mL IVPB        2 g 200 mL/hr over 30 Minutes Intravenous  Once 11/28/22 1547 11/28/22 1806   11/28/22 1600  metroNIDAZOLE (FLAGYL) IVPB 500 mg        500 mg 100 mL/hr over 60 Minutes Intravenous  Once 11/28/22 1547 11/28/22 1723   11/28/22 1600  vancomycin (VANCOCIN) IVPB 1000 mg/200 mL premix  Status:  Discontinued        1,000 mg 200 mL/hr over  60 Minutes Intravenous  Once 11/28/22 1547 11/28/22 1550   11/28/22 1600  vancomycin (VANCOREADY) IVPB 1750 mg/350 mL        1,750 mg 175 mL/hr over 120 Minutes Intravenous  Once 11/28/22 1550 11/28/22 2212       Nutritional status:  Body mass index is 36.81 kg/m.          Objective: Vitals:   12/11/22 0254 12/11/22 0730  BP: (!) 91/56 111/65  Pulse: (!) 58 74  Resp: (!) 22 (!) 28  Temp: (!) 97.5 F (36.4 C) 97.6 F (36.4 C)  SpO2: 95% 93%    Intake/Output Summary (Last 24 hours) at 12/11/2022 0955 Last data filed at 12/10/2022 2131 Gross per 24 hour  Intake 3 ml  Output --  Net 3 ml   Filed Weights   12/09/22 0330 12/10/22 0351 12/11/22 0500  Weight: 86.4 kg 85.5 kg 85.5 kg   Weight change: 0 kg Body mass index is 36.81 kg/m.   Physical Exam: General exam: Pleasant, elderly Caucasian female.  Sitting up in recliner.  Not in distress Skin: No rashes, lesions or ulcers. HEENT: Atraumatic, normocephalic, no obvious bleeding Lungs: Bilateral Velcro-like crackles present CVS: Regular rate and rhythm, no murmur GI/Abd soft, nontender, nondistended, bowel sound  present CNS: Alert, awake, oriented x 3 Psychiatry: Mood appropriate Extremities: No pedal edema, no calf tenderness  Data Review: I have personally reviewed the laboratory data and studies available.  F/u labs ordered Unresulted Labs (From admission, onward)    None       Total time spent in review of labs and imaging, patient evaluation, formulation of plan, documentation and communication with family: 55 minutes  Signed, Lorin Glass, MD [facility] 12/11/2022

## 2022-12-11 NOTE — Progress Notes (Signed)
Physical Therapy Treatment Patient Details Name: [name] MRN: [id number] DOB: [birth year] Today's Date: 12/11/2022   History of Present Illness The pt is a 72 yo female presenting 4/4 with hemoptysis and hypoxia to 51% on 2L O2. Pt found to have PNA with pulmonary fibrosis. Started [facility] 4/6. PMH includes: CHF, emphysema, HTN, OA, afib, and chronic pain.    PT Comments    Patient progressing with endurance walking further without seated rest.  Asking about home equipment and educated in process with RNCM ordering when d/c planned.  She will benefit from follow up HHPT at d/c.    Recommendations for follow up therapy are one component of a multi-disciplinary discharge planning process, led by the attending physician.  Recommendations may be updated based on patient status, additional functional criteria and insurance authorization.  Follow Up Recommendations       Assistance Recommended at Discharge Intermittent Supervision/Assistance  Patient can return home with the following A little help with walking and/or transfers;Assistance with cooking/housework;Assist for transportation;Help with stairs or ramp for entrance   Equipment Recommendations  Rollator (4 wheels);BSC/3in1    Recommendations for Other Services       Precautions / Restrictions Precautions Precautions: Fall Precaution Comments: Monitor O2     Mobility  Bed Mobility Overal bed mobility: Modified Independent                  Transfers Overall transfer level: Modified independent   Transfers: Sit to/from Stand Sit to Stand: Modified independent (Device/Increase time)                Ambulation/Gait Ambulation/Gait assistance: Supervision Gait Distance (Feet): 150 Feet Assistive device: Rollator (4 wheels) Gait Pattern/deviations: Step-through pattern, Decreased stride length       General Gait Details: used rollator to practice, though no seated rest breaks just two standing rest and  min questioning cues for walker managment with transfers   Stairs             Wheelchair Mobility    Modified Rankin (Stroke Patients Only)       Balance Overall balance assessment: Needs assistance   Sitting balance-Leahy Scale: Good       Standing balance-Leahy Scale: Fair                              Cognition Arousal/Alertness: Awake/alert Behavior During Therapy: WFL for tasks assessed/performed Overall Cognitive Status: Within Functional Limits for tasks assessed                                          Exercises      General Comments General comments (skin integrity, edema, etc.): on portable O2 @ 6LPM SpO2 92% or greater throughout with RR max 31.      Pertinent Vitals/Pain Pain Assessment Pain Assessment: No/denies pain    Home Living                          Prior Function            PT Goals (current goals can now be found in the care plan section) Progress towards PT goals: Progressing toward goals    Frequency    Min 3X/week      PT Plan Current plan remains appropriate    Co-evaluation  AM-PAC PT "6 Clicks" Mobility   Outcome Measure  Help needed turning from your back to your side while in a flat bed without using bedrails?: None Help needed moving from lying on your back to sitting on the side of a flat bed without using bedrails?: None Help needed moving to and from a bed to a chair (including a wheelchair)?: None Help needed standing up from a chair using your arms (e.g., wheelchair or bedside chair)?: None Help needed to walk in hospital room?: A Little Help needed climbing 3-5 steps with a railing? : Total 6 Click Score: 20    End of Session Equipment Utilized During Treatment: Gait belt;Oxygen Activity Tolerance: Patient tolerated treatment well Patient left: in chair   PT Visit Diagnosis: Unsteadiness on feet (R26.81);Difficulty in walking, not elsewhere  classified (R26.2)     Time: 1610-9604 PT Time Calculation (min) (ACUTE ONLY): 25 min  Charges:  $Gait Training: 8-22 mins $Self Care/Home Management: 8-22                     Sheran Lawless, PT Acute Rehabilitation Services Office:[phone] 12/11/2022    [name] 12/11/2022, 11:24 AM

## 2022-12-11 NOTE — Progress Notes (Signed)
Mobility Specialist Progress Note   12/11/22 1545  Mobility  Activity Ambulated with assistance in room  Level of Assistance Standby assist, set-up cues, supervision of patient - no hands on  Assistive Device Front wheel walker  Distance Ambulated (ft) 40 ft  Range of Motion/Exercises Active;All extremities  Activity Response Tolerated well   Patient received in standing at the bedside and agreeable to participate. Ambulated short distance in room with supervision and slow steady gait. Required standing rest break x1 secondary to SOB. Returned to recliner without complaint or incident. Was left with all needs met, call bell in reach.   [place] Gavin Telford, BS EXP Mobility Specialist Please contact via SecureChat or Rehab office at [phone]

## 2022-12-12 DIAGNOSIS — J189 Pneumonia, unspecified organism: Secondary | ICD-10-CM | POA: Diagnosis not present

## 2022-12-12 LAB — CBC WITH DIFFERENTIAL/PLATELET
Abs Immature Granulocytes: 0.18 10*3/uL — ABNORMAL HIGH (ref 0.00–0.07)
Basophils Absolute: 0 10*3/uL (ref 0.0–0.1)
Basophils Relative: 0 %
Eosinophils Absolute: 0.2 10*3/uL (ref 0.0–0.5)
Eosinophils Relative: 1 %
HCT: 41.7 % (ref 36.0–46.0)
Hemoglobin: 13.9 g/dL (ref 12.0–15.0)
Immature Granulocytes: 1 %
Lymphocytes Relative: 12 %
Lymphs Abs: 2 10*3/uL (ref 0.7–4.0)
MCH: 29.7 pg (ref 26.0–34.0)
MCHC: 33.3 g/dL (ref 30.0–36.0)
MCV: 89.1 fL (ref 80.0–100.0)
Monocytes Absolute: 1.7 10*3/uL — ABNORMAL HIGH (ref 0.1–1.0)
Monocytes Relative: 10 %
Neutro Abs: 12.5 10*3/uL — ABNORMAL HIGH (ref 1.7–7.7)
Neutrophils Relative %: 76 %
Platelets: 200 10*3/uL (ref 150–400)
RBC: 4.68 MIL/uL (ref 3.87–5.11)
RDW: 14.7 % (ref 11.5–15.5)
WBC: 16.5 10*3/uL — ABNORMAL HIGH (ref 4.0–10.5)
nRBC: 0 % (ref 0.0–0.2)

## 2022-12-12 LAB — BASIC METABOLIC PANEL
Anion gap: 10 (ref 5–15)
BUN: 39 mg/dL — ABNORMAL HIGH (ref 8–23)
CO2: 33 mmol/L — ABNORMAL HIGH (ref 22–32)
Calcium: 9.1 mg/dL (ref 8.9–10.3)
Chloride: 95 mmol/L — ABNORMAL LOW (ref 98–111)
Creatinine, Ser: 1.38 mg/dL — ABNORMAL HIGH (ref 0.44–1.00)
GFR, Estimated: 41 mL/min — ABNORMAL LOW (ref >60)
Glucose, Bld: 94 mg/dL (ref 70–99)
Potassium: 4.5 mmol/L (ref 3.5–5.1)
Sodium: 138 mmol/L (ref 135–145)

## 2022-12-12 LAB — GLUCOSE, CAPILLARY
Glucose-Capillary: 78 mg/dL (ref 70–99)
Glucose-Capillary: 145 mg/dL — ABNORMAL HIGH (ref 70–99)

## 2022-12-12 MED ORDER — INSULIN DETEMIR 100 UNIT/ML ~~LOC~~ SOLN
5.0000 [IU] | Freq: Every day | SUBCUTANEOUS | Status: DC
  Administered 2022-12-12: 5 [IU] via SUBCUTANEOUS
  Filled 2022-12-12: qty 0.05

## 2022-12-12 MED ORDER — DOFETILIDE 250 MCG PO CAPS
250.0000 ug | ORAL_CAPSULE | Freq: Two times a day (BID) | ORAL | Status: DC
  Filled 2022-12-12: qty 1

## 2022-12-12 MED ORDER — PREDNISONE 5 MG PO TABS: ORAL_TABLET | ORAL | 0 refills | Status: DC

## 2022-12-12 NOTE — Progress Notes (Signed)
   Durable Medical Equipment (From admission, onward)        Start     Ordered  12/12/22 1118  For home use only DME 4 wheeled rolling walker with seat  Once      Question:  Patient needs a walker to treat with the following condition  Answer:  Weakness generalized  12/12/22 1118  12/12/22 1118  For home use only DME Bedside commode  Once      Question:  Patient needs a bedside commode to treat with the following condition  Answer:  Weakness generalized  12/12/22 1118, pt has hx pulmonary fibrosis, fatigues easily, needs seated rest breaks for distance, bathroom down the hall

## 2022-12-12 NOTE — Discharge Summary (Signed)
Physician Discharge Summary  [name]:[id number] DOB: 1950/08/31 DOA: 11/28/2022  PCP: [name], MD  Admit date: 11/28/2022 Discharge date: 12/12/2022  Admitted From: Home Discharge disposition: Home with home with PT, DMEs, O2  Recommendations at discharge:  Tikosyn dose has been reduced Irbesartan and Lasix have been stopped Continue to monitor blood pressure at home. You need a blood work next week to check renal function for the acetaminophen medicines You been started on a prolonged tapering course of prednisone.  Follow-up with pulmonary as an outpatient  Brief narrative: JI Warner is a 72 y.o. 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  Hospitalization was prolonged due to progressive worsening of dyspnea requiring up to 20 L of high flow oxygen by nasal cannula.  It was likely due to a combination of pneumonia as well as CHF exacerbation.  Oxygen requirement has now significantly improved with antibiotics, steroids, diuresis.  Subjective: Patient was seen and examined this morning. Pleasant elderly Caucasian female.  Sitting up in recliner.  Wants to go home.  Assessment and plan: Sepsis POA Community-acquired pneumonia Acute on chronic hypoxemic respiratory failure Pulmonary fibrosis Initially admitted for pneumonia.  Hospitalization got prolonged due to progressive worsening of dyspnea requiring up to 20  L of high flow oxygen by nasal cannula. Oxygen requirement gradually. S/p 5 days of antibiotics with ceftriaxone and doxycycline. Per PCCM, maintain 20 mg of prednisone for another week, but not taper until hypoxia back to baseline and then can start tapering by 5 to 10 mg/week Her home concentrator only goes up to 5 L still will need to be at that level before discharge  Acute on chronic diastolic heart failure Essential hypertension Echocardiogram from 12/01/2022 showed EF 65% with grade 1 diastolic dysfunction PTA patient was on Cardizem, irbesartan, Lasix 40 mg daily. Oxygenation improved after diuresis with IV Lasix.  Net negative balance of 6.8 L. 4/17, blood pressures running low.  Creatinine was elevated.  I stopped Lasix and irbesartan. Currently blood pressure is controlled on Cardizem.  At discharge, I would keep irbesartan and Lasix on hold.  Patient needs blood work done early next week for renal function monitoring and reinitiation of Lasix.  Use. Recent Labs  Lab 12/08/22 0228 12/09/22 0253 12/10/22 0843 12/11/22 0157 12/12/22 0418  BUN 41* 42* 32* 36* 39*  CREATININE 1.14* 1.17* 1.04* 1.30* 1.38*  K 4.6 4.5 4.3 5.0 4.5   Asymptomatic bacteriuria Proteus mirabilis on urine culture, asymptomatic Completed antibiotics with ceftriaxone for pneumonia as above   Type 2 diabetes mellitus A1c 6.6 on 12/01/2022 PTA not on meds. Continue diet control.    Atrial fibrillation Heart rate controlled on Tikosyn, diltiazem Dose of Tikosyn reduced because of impaired renal function.  Continue Cardizem.  I have sent a staff message to [facility] to set up an appointment for her in within a week. Continue chronic anticoagulation with Eliquis   Obstructive sleep apnea Continue BiPAP nightly   Chronic pain/anxiety Continue Norco, Ativan   Hemoptysis Had scant hemoptysis, no PE on CTA chest  Likely in setting of atypical infection pulmonary fibrosis Eliquis was initially held,  later resumed.   Thoracic aortic ectasia Noted on CTA chest in the ED Follow CT surgery as outpatient as planned  Impaired mobility HHPT recommended   Goals of care   Code Status: Full Code   Wounds:  -    Discharge Exam:   Vitals:   12/12/22 0316 12/12/22 0456 12/12/22 0805 12/12/22 1151  BP: (!) 87/55  114/70 117/70  Pulse: 73  100 75  Resp: 20  17 19   Temp: 98.1 F (36.7 C)  97.9 F (36.6 C) 97.9 F (36.6 C)  TempSrc: Oral  Oral Oral  SpO2: 93%  93% 93%  Weight:  86 kg    Height:        Body mass index is 37.03 kg/m.  General exam: Pleasant, elderly Caucasian female.  Sitting up in recliner.  Not in distress Skin: No rashes, lesions or ulcers.  Looks dry. HEENT: Atraumatic, normocephalic, no obvious bleeding Lungs: Bilateral Velcro-like crackles present CVS: Regular rate and rhythm, no murmur GI/Abd soft, nontender, nondistended, bowel sound present CNS: Alert, awake, oriented x 3 Psychiatry: Mood appropriate Extremities: No pedal edema, no calf tenderness  Follow ups:    Follow-up Information     [facility] Go on 12/23/2022.   Specialty: Pulmonology Why: Follow up with NP Cobb with [facility] in [place] on 4/29 at 2:00 pm Contact information: [address] [place] [address] [phone]        [facility] Follow up.   Why: (Adapt)- Rollator and Bedside Commode arranged to be delivered to room prior to discharge- home 02 humidification also arranged- they will bring that to your home later today and show you how to use it. Contact information: [address] [place] [address] [phone]         [facility] Follow up.   Why: HHPT arranged - they will contact you to schedule- anticipate first visit for 4/19. [address] [place], [place], [place] [address] Phone: [phone]        [name], MD Follow up.   Specialty:  Family Medicine Contact information: [address] [place] [address] [phone]         [facility] Follow up in 1 week(s).   Specialty: Cardiology Contact information: [address] [id number] [place] [address] [phone]                Discharge Instructions:   Discharge Instructions     Call MD for:  difficulty breathing, headache or visual disturbances   Complete by: As directed    Call MD for:  extreme fatigue   Complete by: As directed    Call MD for:  hives   Complete by: As directed    Call MD for:  persistant dizziness or light-headedness   Complete by: As directed    Call MD for:  persistant nausea and vomiting   Complete by: As directed    Call MD for:  severe uncontrolled pain   Complete by: As directed    Call MD for:  temperature >100.4   Complete by: As directed    Diet - low sodium heart healthy   Complete by: As directed    Discharge instructions   Complete by: As directed    Recommendations at discharge:  Tikosyn dose has been reduced  Irbesartan and Lasix have been stopped  Continue to monitor blood pressure at home.  You need a blood work next week to check renal function for the acetaminophen medicines  You been started on a prolonged tapering course of prednisone.  Follow-up with pulmonary as an outpatient  Discharge instructions for diabetes mellitus: Check blood sugar 3 times a day and bedtime at home. If blood sugar running above 200 or less than 70 please call your MD to adjust insulin. If you notice signs and symptoms of hypoglycemia (low blood sugar) like jitteriness, confusion, thirst, tremor and sweating, please check blood sugar, drink sugary drink/biscuits/sweets to increase sugar level and call MD or return to ER.    Discharge instructions for CHF Check weight daily -preferably same time every day. Restrict fluid  intake to 1200 ml daily Restrict salt intake to less than 2 g daily. Call MD if you have one of the following symptoms 1) 3 pound weight gain in 24 hours or 5 pounds in 1 week  2) swelling in the hands, feet or stomach  3) progressive shortness of breath 4) if you have to sleep on extra pillows at night in order to breathe     General discharge instructions: Follow with Primary MD [name], MD in 7 days  Please request your PCP  to go over your hospital tests, procedures, radiology results at the follow up. Please get your medicines reviewed and adjusted.  Your PCP may decide to repeat certain labs or tests as needed. Do not drive, operate heavy machinery, perform activities at heights, swimming or participation in water activities or provide baby sitting services if your were admitted for syncope or siezures until you have seen by Primary MD or a Neurologist and advised to do so again. [place] Controlled Substance Reporting System database was reviewed. Do not drive, operate heavy machinery, perform activities at heights, swim, participate in water activities or provide baby-sitting services while on medications for pain, sleep and mood until your outpatient physician has reevaluated you and advised to do so again.  You are strongly recommended to comply with the dose, frequency and duration of prescribed medications. Activity: As tolerated with Full fall precautions use walker/cane & assistance as needed Avoid using any recreational substances like cigarette, tobacco, alcohol, or non-prescribed drug. If you experience worsening of your admission symptoms, develop shortness of breath, life threatening emergency, suicidal or homicidal thoughts you must seek medical attention immediately by calling 911 or calling your MD immediately  if symptoms less severe. You must read complete instructions/literature along with all the possible adverse reactions/side effects for all the medicines  you take and that have been prescribed to you. Take any new medicine only after you have completely understood and accepted all the possible adverse reactions/side effects.  Wear Seat belts while driving. You were cared for by a hospitalist during your hospital stay. If you have any questions about your discharge medications or the care you received while you were in the hospital after you are discharged, you can call the unit and ask to speak with the hospitalist or the covering physician. Once you are discharged, your primary care physician will handle any further medical issues. Please note that NO REFILLS for any discharge medications will be authorized once you are discharged, as it is imperative that you return to your primary care physician (or establish a relationship with a primary care physician if you do not have one).  Increase activity slowly   Complete by: As directed        Discharge Medications:   Allergies as of 12/12/2022       Reactions   Oxycodone Hcl Itching   Levaquin [levofloxacin In D5w] Itching, Rash   Percocet [oxycodone-acetaminophen] Itching        Medication List     STOP taking these medications    cyanocobalamin 1000 MCG tablet Commonly known as: VITAMIN B12   furosemide 40 MG tablet Commonly known as: LASIX   irbesartan 300 MG tablet Commonly known as: AVAPRO   potassium chloride SA 20 MEQ tablet Commonly known as: KLOR-CON M       TAKE these medications    Accu-Chek Guide test strip Generic drug: glucose blood See admin instructions.   acetaminophen 500 MG tablet Commonly known as: TYLENOL Take 500 mg by mouth every 6 (six) hours as needed for moderate pain.   apixaban 5 MG Tabs tablet Commonly known as: Eliquis Take 1 tablet (5 mg total) by mouth 2 (two) times daily.   ascorbic acid 500 MG tablet Commonly known as: VITAMIN C Take 500 mg by mouth every other day.   atorvastatin 40 MG tablet Commonly known as: LIPITOR Take 1  tablet (40 mg total) by mouth daily.   brimonidine 0.2 % ophthalmic solution Commonly known as: ALPHAGAN Place 1 drop into both eyes 2 (two) times daily.   calcium citrate-vitamin D 315-200 MG-UNIT tablet Commonly known as: CITRACAL+D Take 1 tablet by mouth daily.   cholecalciferol 25 MCG (1000 UNIT) tablet Commonly known as: VITAMIN D3 Take 1,000 Units by mouth every other day.   clotrimazole-betamethasone cream Commonly known as: LOTRISONE Apply 1 Application topically 2 (two) times daily.   diltiazem 240 MG 24 hr capsule Commonly known as: CARDIZEM CD Take 1 capsule (240 mg total) by mouth daily.   dofetilide 500 MCG capsule Commonly known as: TIKOSYN TAKE ONE CAPSULE BY MOUTH TWICE DAILY   GINKGO BILOBA COMPLEX PO Take 1 tablet by mouth every other day.   HAIR SKIN NAILS PO Take 1 tablet by mouth every other day.   HYDROcodone-acetaminophen 5-325 MG tablet Commonly known as: NORCO/VICODIN Take 1 tablet by mouth every 6 (six) hours as needed for moderate pain.   LORazepam 1 MG tablet Commonly known as: ATIVAN Take 1 mg by mouth daily as needed for anxiety.   multivitamin capsule Take 1 capsule by mouth daily.   OXYGEN 2lpm 24/7 and 4 lpm with exertion AHC   predniSONE 5 MG tablet Commonly known as: DELTASONE 20 mg daily for 1 week, followed by 15 mg daily for 1 week followed by 10 mg daily for 1 week followed by 5 mg daily for 1 week then stop Start taking on: December 13, 2022   Rocklatan 0.02-0.005 % Soln Generic drug: Netarsudil-Latanoprost Place 1 drop into both eyes at bedtime.   SINEX ULTRA FINE MIST 12-HOUR NA Place 1 spray into the nose daily as needed (congestion).   UNABLE TO FIND BIPAP with 2lpm o2 at night  [facility]               Durable Medical Equipment  (From admission, onward)           Start     Ordered   12/12/22 1126  For home use only DME oxygen  Once       Question Answer Comment  Length of Need Lifetime   Mode or  (Route) Nasal cannula   Liters  per Minute 4   Frequency Continuous (stationary and portable oxygen unit needed)   Oxygen conserving device Yes   Oxygen delivery system Gas      12/12/22 1125   12/12/22 1118  For home use only DME 4 wheeled rolling walker with seat  Once       Question:  Patient needs a walker to treat with the following condition  Answer:  Weakness generalized   12/12/22 1118   12/12/22 1118  For home use only DME Bedside commode  Once       Question:  Patient needs a bedside commode to treat with the following condition  Answer:  Weakness generalized   12/12/22 1118             The results of significant diagnostics from this hospitalization (including imaging, microbiology, ancillary and laboratory) are listed below for reference.    Procedures and Diagnostic Studies:   CT Angio Chest PE W and/or Wo Contrast  Result Date: 11/28/2022 CLINICAL DATA:  Shortness of breath and headache.  Hemoptysis today. EXAM: CT ANGIOGRAPHY CHEST WITH CONTRAST TECHNIQUE: Multidetector CT imaging of the chest was performed using the standard protocol during bolus administration of intravenous contrast. Multiplanar CT image reconstructions and MIPs were obtained to evaluate the vascular anatomy. RADIATION DOSE REDUCTION: This exam was performed according to the departmental dose-optimization program which includes automated exposure control, adjustment of the mA and/or kV according to patient size and/or use of iterative reconstruction technique. CONTRAST:  80mL OMNIPAQUE IOHEXOL 350 MG/ML SOLN COMPARISON:  Radiographs earlier today and CTA chest 07/16/2022 FINDINGS: Cardiovascular: Satisfactory opacification of the central pulmonary arteries. The segmental and subsegmental lower lobe pulmonary arteries are not well evaluated due to motion. No evidence of central pulmonary embolism. Normal heart size. No pericardial effusion. Coronary artery and aortic atherosclerotic calcification. Severe  mixed calcific atherosclerosis in the aortic arch with multiple small penetrating ulcers. 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 Mediastinum/Nodes: No enlarged mediastinal, hilar, or axillary lymph nodes. Thyroid gland, trachea, and esophagus demonstrate no significant findings. Lungs/Pleura: Low lung volumes. Diffuse patchy ground-glass opacities and interlobular septal thickening are new since 07/16/2022. This is superimposed on a background of mild pulmonary fibrosis with a apical to basal gradient and peripheral reticular opacities. No pleural effusion or pneumothorax. Right upper lobe bronchiolectasis. Upper Abdomen: No acute abnormality. Musculoskeletal: No chest wall abnormality. No acute osseous findings. Review of the MIP images confirms the above findings. IMPRESSION: 1. No evidence of central pulmonary embolism. The segmental and subsegmental lower lobe pulmonary arteries are not well evaluated due to motion. 2. Diffuse patchy ground-glass opacities superimposed on a background of mild pulmonary fibrosis. Findings are favored to represent atypical infection. 3. 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 Recommend semi-annual imaging followup by CTA or MRA and referral to cardiothoracic surgery if not already obtained. This recommendation follows 2010 ACCF/AHA/AATS/ACR/ASA/SCA/SCAI/SIR/STS/SVM Guidelines for the Diagnosis and Management of Patients With Thoracic Aortic Disease. Circulation. 2010; 121: Z610-R604. Aortic aneurysm NOS (ICD10-I71.9) 4. 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 Aortic Atherosclerosis (ICD10-I70.0).  Electronically Signed   By: Minerva Fester M.D.   On: 11/28/2022 19:52   DG Chest [facility]  Result Date: 11/28/2022 CLINICAL DATA:  Question sepsis. EXAM: PORTABLE CHEST 1 VIEW COMPARISON:  Chest CT 07/16/2022 FINDINGS: Stable enlarged cardiac silhouette ectatic aorta. There is bilateral interstitial prominence. Low lung volumes. No pneumothorax. No focal consolidation IMPRESSION: Cardiomegaly and interstitial prominence suggest interstitial edema. Electronically Signed   By: Genevive Bi M.D.   On: 11/28/2022 14:59     Labs:   Basic Metabolic Panel: Recent Labs  Lab 12/08/22 0228 12/09/22 0253 12/10/22 0843 12/11/22 0157 12/12/22 0418  NA 135 137 135 138 138  K 4.6 4.5 4.3 5.0 4.5  CL 95* 98 97* 97* 95*  CO2 33* 30 31 33* 33*  GLUCOSE 155* 89 109* 82 94  BUN 41* 42* 32* 36* 39*  CREATININE 1.14* 1.17* 1.04* 1.30* 1.38*  CALCIUM 8.6* 8.8* 9.1 9.4 9.1   GFR Estimated Creatinine Clearance: 35.9 mL/min (A) (by C-G formula based on SCr of 1.38 mg/dL (H)). Liver Function Tests: No results for input(s): "AST", "ALT", "ALKPHOS", "BILITOT", "PROT", "ALBUMIN" in the last 168 hours. No results for input(s): "LIPASE", "AMYLASE" in the last 168 hours. No results for input(s): "AMMONIA" in the last 168 hours. Coagulation profile No results for input(s): "INR", "PROTIME" in the last 168 hours.  CBC: Recent Labs  Lab 12/06/22 0248 12/09/22 0253 12/12/22 0418  WBC 15.7* 16.0* 16.5*  NEUTROABS  --   --  12.5*  HGB 14.5 13.4 13.9  HCT 43.5 42.0 41.7  MCV 89.3 91.9 89.1  PLT 221 207 200   Cardiac Enzymes: No results for input(s): "CKTOTAL", "CKMB", "CKMBINDEX", "TROPONINI" in the last 168 hours. BNP: Invalid input(s): "POCBNP" CBG: Recent Labs  Lab 12/11/22 1125 12/11/22 1524 12/11/22 2145 12/12/22 0647 12/12/22 1148  GLUCAP 111* 213* 165* 78 145*   D-Dimer No results for input(s): "DDIMER" in the last 72 hours. Hgb A1c No results for input(s): "HGBA1C" in the  last 72 hours. Lipid Profile No results for input(s): "CHOL", "HDL", "LDLCALC", "TRIG", "CHOLHDL", "LDLDIRECT" in the last 72 hours. Thyroid function studies No results for input(s): "TSH", "T4TOTAL", "T3FREE", "THYROIDAB" in the last 72 hours.  Invalid input(s): "FREET3" Anemia work up No results for input(s): "VITAMINB12", "FOLATE", "FERRITIN", "TIBC", "IRON", "RETICCTPCT" in the last 72 hours. Microbiology No results found for this or any previous visit (from the past 240 hour(s)).  Time coordinating discharge: 45 minutes  Signed: Toryn Mcclinton  [facility] 12/12/2022, 1:29 PM

## 2022-12-12 NOTE — Progress Notes (Signed)
Pt with orders to d/c home with [facility]. PIV removed. Pt stable. Discharge education and packet provided all questions answered. DME delivered to room. Prescriptions sent to pharmacy pt is aware. Pt transported via wheelchair to private vehicle.

## 2022-12-12 NOTE — Care Management Important Message (Signed)
Important Message  Patient Details  Name: [name] MRN: [id number] Date of Birth: 09/04/1950   Medicare Important Message Given:  Yes     Sherilyn Banker 12/12/2022, 12:06 PM

## 2022-12-12 NOTE — Progress Notes (Signed)
SATURATION QUALIFICATIONS: (This note is used to comply with regulatory documentation for home oxygen)  Patient Saturations on Room Air at Rest = 88%  Patient Saturations on Room Air while Ambulating = 85%  Patient Saturations on 4 Liters of oxygen while Ambulating = 92 %

## 2022-12-12 NOTE — TOC Transition Note (Signed)
Transition of Care (TOC) - CM/SW Discharge Note Donn Pierini RN,BSN Transitions of Care Unit 4NP (Non Trauma)- RN Case Manager See Treatment Team for direct Phone #   Patient Details  Name: [name] MRN: [id number] Date of Birth: 1951/04/18  Transition of Care [facility]) CM/SW Contact:  Darrold Span, RN Phone Number: 12/12/2022, 12:17 PM   Clinical Narrative:    Notified by MD pt stable for transition home today. Order placed for HHPT.  CM in to speak with pt at bedside- [facility] discussed and list provided for [facility] choice Per CMS guidelines from [email or web address] website with star ratings (copy placed in shadow chart)- after review pt has selected [facility] as first choice with [facility] as backup.   CM also discussed DME w/ pt- pt voiced she has her home 02 w/ [employer], would like humidification added, pt also was on 2L baseline, now on 4L at bedside. Will request updated home 02 order- and will also need updated qualifying note. Per PT notes- recs also for [facility] and rollator- pt voiced she would also need these as she does not have them at home. Orders also placed for DME- BSC and rollator. Pt to f/u and purchase shower chair out of pocket as insurance does not cover shower chair.   Address, phone # and PCP all confirmed. Pt voiced her DIL- Iva Boop to provide transport home- [phone], pt states she has portable concentrator ([employer]) to transport home on that goes to 4L- she purchased this as insurance does not cover [employer] portable concentrators.   Call made to [employer] for DME needs- rollator and BSC to be delivered to room prior to discharge, [employer] to f/u and deliver humidification to the home later today so they can instruct pt on use.   Call made to [facility] for HHPT referral- liaison Angie checking on staffing- awaiting return call.   1225- received msg from [facility]- they have staffing and have accepted referral for HHPT needs- plan is to do start of care visit  tomorrow 4/19.    Final next level of care: Home w Home Health Services Barriers to Discharge: Barriers Resolved   Patient Goals and CMS Choice CMS Medicare.gov Compare Post Acute Care list provided to:: Patient Choice offered to / list presented to : Patient  Discharge Placement                 Home w/ [facility]        Discharge Plan and Services Additional resources added to the After Visit Summary for     Discharge Planning Services: CM Consult Post Acute Care Choice: Home Health, Durable Medical Equipment          DME Arranged: Bedside commode, Walker rolling with seat DME Agency: [employer] Date DME Agency Contacted: 12/12/22 Time DME Agency Contacted: 1129 Representative spoke with at DME Agency: Barbara Cower HH Arranged: PT   Date [facility] Agency Contacted: 12/12/22 Time HH Agency Contacted: 1129    Social Determinants of Health (SDOH) Interventions SDOH Screenings   Food Insecurity: No Food Insecurity (12/03/2022)  Housing: Low Risk  (12/03/2022)  Transportation Needs: No Transportation Needs (12/03/2022)  Utilities: Not At Risk (12/03/2022)  Tobacco Use: Medium Risk (11/28/2022)     Readmission Risk Interventions    12/12/2022   11:29 AM  Readmission Risk Prevention Plan  Transportation Screening Complete  PCP or Specialist Appt within 5-7 Days Complete  Home Care Screening Complete  Medication Review (RN CM) Complete

## 2022-12-17 ENCOUNTER — Emergency Department (HOSPITAL_COMMUNITY): Payer: Medicare Other

## 2022-12-17 ENCOUNTER — Encounter (HOSPITAL_COMMUNITY): Payer: Self-pay

## 2022-12-17 ENCOUNTER — Other Ambulatory Visit: Payer: Self-pay

## 2022-12-17 ENCOUNTER — Inpatient Hospital Stay (HOSPITAL_COMMUNITY)
Admission: EM | Admit: 2022-12-17 | Discharge: 2022-12-25 | DRG: 196 | Disposition: E | Payer: Medicare Other | Attending: Student | Admitting: Student

## 2022-12-17 DIAGNOSIS — J8410 Pulmonary fibrosis, unspecified: Principal | ICD-10-CM | POA: Diagnosis present

## 2022-12-17 DIAGNOSIS — Z87891 Personal history of nicotine dependence: Secondary | ICD-10-CM

## 2022-12-17 DIAGNOSIS — I480 Paroxysmal atrial fibrillation: Secondary | ICD-10-CM | POA: Diagnosis not present

## 2022-12-17 DIAGNOSIS — Z9981 Dependence on supplemental oxygen: Secondary | ICD-10-CM

## 2022-12-17 DIAGNOSIS — J9621 Acute and chronic respiratory failure with hypoxia: Secondary | ICD-10-CM | POA: Diagnosis not present

## 2022-12-17 DIAGNOSIS — Z6837 Body mass index (BMI) 37.0-37.9, adult: Secondary | ICD-10-CM

## 2022-12-17 DIAGNOSIS — G894 Chronic pain syndrome: Secondary | ICD-10-CM | POA: Diagnosis present

## 2022-12-17 DIAGNOSIS — I10 Essential (primary) hypertension: Secondary | ICD-10-CM | POA: Diagnosis present

## 2022-12-17 DIAGNOSIS — E1165 Type 2 diabetes mellitus with hyperglycemia: Secondary | ICD-10-CM | POA: Diagnosis not present

## 2022-12-17 DIAGNOSIS — I77810 Thoracic aortic ectasia: Secondary | ICD-10-CM | POA: Diagnosis not present

## 2022-12-17 DIAGNOSIS — Z9841 Cataract extraction status, right eye: Secondary | ICD-10-CM

## 2022-12-17 DIAGNOSIS — R5381 Other malaise: Secondary | ICD-10-CM | POA: Diagnosis present

## 2022-12-17 DIAGNOSIS — J9601 Acute respiratory failure with hypoxia: Secondary | ICD-10-CM | POA: Diagnosis not present

## 2022-12-17 DIAGNOSIS — J8489 Other specified interstitial pulmonary diseases: Secondary | ICD-10-CM | POA: Diagnosis not present

## 2022-12-17 DIAGNOSIS — F419 Anxiety disorder, unspecified: Secondary | ICD-10-CM | POA: Diagnosis present

## 2022-12-17 DIAGNOSIS — Z885 Allergy status to narcotic agent status: Secondary | ICD-10-CM

## 2022-12-17 DIAGNOSIS — E1169 Type 2 diabetes mellitus with other specified complication: Secondary | ICD-10-CM | POA: Diagnosis not present

## 2022-12-17 DIAGNOSIS — D696 Thrombocytopenia, unspecified: Secondary | ICD-10-CM | POA: Diagnosis not present

## 2022-12-17 DIAGNOSIS — J9690 Respiratory failure, unspecified, unspecified whether with hypoxia or hypercapnia: Secondary | ICD-10-CM | POA: Diagnosis not present

## 2022-12-17 DIAGNOSIS — I4819 Other persistent atrial fibrillation: Secondary | ICD-10-CM | POA: Diagnosis not present

## 2022-12-17 DIAGNOSIS — E7800 Pure hypercholesterolemia, unspecified: Secondary | ICD-10-CM | POA: Diagnosis not present

## 2022-12-17 DIAGNOSIS — I700 Atherosclerosis of aorta: Secondary | ICD-10-CM | POA: Diagnosis present

## 2022-12-17 DIAGNOSIS — R6889 Other general symptoms and signs: Secondary | ICD-10-CM | POA: Diagnosis not present

## 2022-12-17 DIAGNOSIS — Z1152 Encounter for screening for COVID-19: Secondary | ICD-10-CM | POA: Diagnosis not present

## 2022-12-17 DIAGNOSIS — J80 Acute respiratory distress syndrome: Secondary | ICD-10-CM | POA: Diagnosis present

## 2022-12-17 DIAGNOSIS — N183 Chronic kidney disease, stage 3 unspecified: Secondary | ICD-10-CM | POA: Diagnosis not present

## 2022-12-17 DIAGNOSIS — Y92239 Unspecified place in hospital as the place of occurrence of the external cause: Secondary | ICD-10-CM | POA: Diagnosis not present

## 2022-12-17 DIAGNOSIS — I714 Abdominal aortic aneurysm, without rupture, unspecified: Secondary | ICD-10-CM | POA: Diagnosis not present

## 2022-12-17 DIAGNOSIS — J9612 Chronic respiratory failure with hypercapnia: Secondary | ICD-10-CM | POA: Diagnosis not present

## 2022-12-17 DIAGNOSIS — Z515 Encounter for palliative care: Secondary | ICD-10-CM

## 2022-12-17 DIAGNOSIS — J9611 Chronic respiratory failure with hypoxia: Secondary | ICD-10-CM | POA: Diagnosis not present

## 2022-12-17 DIAGNOSIS — J441 Chronic obstructive pulmonary disease with (acute) exacerbation: Secondary | ICD-10-CM | POA: Diagnosis present

## 2022-12-17 DIAGNOSIS — G4733 Obstructive sleep apnea (adult) (pediatric): Secondary | ICD-10-CM | POA: Diagnosis present

## 2022-12-17 DIAGNOSIS — R0902 Hypoxemia: Secondary | ICD-10-CM | POA: Diagnosis not present

## 2022-12-17 DIAGNOSIS — R0600 Dyspnea, unspecified: Secondary | ICD-10-CM | POA: Diagnosis not present

## 2022-12-17 DIAGNOSIS — Z7901 Long term (current) use of anticoagulants: Secondary | ICD-10-CM | POA: Diagnosis not present

## 2022-12-17 DIAGNOSIS — M1990 Unspecified osteoarthritis, unspecified site: Secondary | ICD-10-CM | POA: Diagnosis present

## 2022-12-17 DIAGNOSIS — Z743 Need for continuous supervision: Secondary | ICD-10-CM | POA: Diagnosis not present

## 2022-12-17 DIAGNOSIS — R042 Hemoptysis: Secondary | ICD-10-CM | POA: Diagnosis not present

## 2022-12-17 DIAGNOSIS — N179 Acute kidney failure, unspecified: Secondary | ICD-10-CM | POA: Diagnosis not present

## 2022-12-17 DIAGNOSIS — Z7983 Long term (current) use of bisphosphonates: Secondary | ICD-10-CM

## 2022-12-17 DIAGNOSIS — Y95 Nosocomial condition: Secondary | ICD-10-CM | POA: Diagnosis present

## 2022-12-17 DIAGNOSIS — Z79899 Other long term (current) drug therapy: Secondary | ICD-10-CM

## 2022-12-17 DIAGNOSIS — J479 Bronchiectasis, uncomplicated: Secondary | ICD-10-CM | POA: Diagnosis not present

## 2022-12-17 DIAGNOSIS — Z881 Allergy status to other antibiotic agents status: Secondary | ICD-10-CM

## 2022-12-17 DIAGNOSIS — J470 Bronchiectasis with acute lower respiratory infection: Secondary | ICD-10-CM | POA: Diagnosis present

## 2022-12-17 DIAGNOSIS — J189 Pneumonia, unspecified organism: Secondary | ICD-10-CM | POA: Diagnosis present

## 2022-12-17 DIAGNOSIS — E669 Obesity, unspecified: Secondary | ICD-10-CM | POA: Diagnosis present

## 2022-12-17 DIAGNOSIS — Z9842 Cataract extraction status, left eye: Secondary | ICD-10-CM

## 2022-12-17 DIAGNOSIS — I5032 Chronic diastolic (congestive) heart failure: Secondary | ICD-10-CM | POA: Diagnosis not present

## 2022-12-17 DIAGNOSIS — Z8249 Family history of ischemic heart disease and other diseases of the circulatory system: Secondary | ICD-10-CM

## 2022-12-17 DIAGNOSIS — J439 Emphysema, unspecified: Secondary | ICD-10-CM | POA: Diagnosis not present

## 2022-12-17 DIAGNOSIS — I2510 Atherosclerotic heart disease of native coronary artery without angina pectoris: Secondary | ICD-10-CM | POA: Diagnosis present

## 2022-12-17 DIAGNOSIS — E875 Hyperkalemia: Secondary | ICD-10-CM | POA: Diagnosis present

## 2022-12-17 DIAGNOSIS — J440 Chronic obstructive pulmonary disease with acute lower respiratory infection: Secondary | ICD-10-CM | POA: Diagnosis not present

## 2022-12-17 DIAGNOSIS — Z66 Do not resuscitate: Secondary | ICD-10-CM | POA: Diagnosis not present

## 2022-12-17 DIAGNOSIS — T380X5A Adverse effect of glucocorticoids and synthetic analogues, initial encounter: Secondary | ICD-10-CM | POA: Diagnosis not present

## 2022-12-17 DIAGNOSIS — Z713 Dietary counseling and surveillance: Secondary | ICD-10-CM

## 2022-12-17 DIAGNOSIS — I110 Hypertensive heart disease with heart failure: Secondary | ICD-10-CM | POA: Diagnosis present

## 2022-12-17 DIAGNOSIS — Z7952 Long term (current) use of systemic steroids: Secondary | ICD-10-CM

## 2022-12-17 LAB — I-STAT VENOUS BLOOD GAS, ED
Acid-Base Excess: 5 mmol/L — ABNORMAL HIGH (ref 0.0–2.0)
Bicarbonate: 29.9 mmol/L — ABNORMAL HIGH (ref 20.0–28.0)
Calcium, Ion: 1.09 mmol/L — ABNORMAL LOW (ref 1.15–1.40)
HCT: 41 % (ref 36.0–46.0)
Hemoglobin: 13.9 g/dL (ref 12.0–15.0)
O2 Saturation: 59 %
Potassium: 7.3 mmol/L (ref 3.5–5.1)
Sodium: 133 mmol/L — ABNORMAL LOW (ref 135–145)
TCO2: 31 mmol/L (ref 22–32)
pCO2, Ven: 43.9 mmHg — ABNORMAL LOW (ref 44–60)
pH, Ven: 7.441 — ABNORMAL HIGH (ref 7.25–7.43)
pO2, Ven: 30 mmHg — CL (ref 32–45)

## 2022-12-17 LAB — CBC WITH DIFFERENTIAL/PLATELET
Abs Immature Granulocytes: 0.36 10*3/uL — ABNORMAL HIGH (ref 0.00–0.07)
Basophils Absolute: 0.1 10*3/uL (ref 0.0–0.1)
Basophils Relative: 0 %
Eosinophils Absolute: 0.4 10*3/uL (ref 0.0–0.5)
Eosinophils Relative: 2 %
HCT: 39.5 % (ref 36.0–46.0)
Hemoglobin: 13 g/dL (ref 12.0–15.0)
Immature Granulocytes: 2 %
Lymphocytes Relative: 8 %
Lymphs Abs: 1.4 10*3/uL (ref 0.7–4.0)
MCH: 30 pg (ref 26.0–34.0)
MCHC: 32.9 g/dL (ref 30.0–36.0)
MCV: 91.2 fL (ref 80.0–100.0)
Monocytes Absolute: 1.6 10*3/uL — ABNORMAL HIGH (ref 0.1–1.0)
Monocytes Relative: 9 %
Neutro Abs: 14.8 10*3/uL — ABNORMAL HIGH (ref 1.7–7.7)
Neutrophils Relative %: 79 %
Platelets: 156 10*3/uL (ref 150–400)
RBC: 4.33 MIL/uL (ref 3.87–5.11)
RDW: 15.6 % — ABNORMAL HIGH (ref 11.5–15.5)
WBC: 18.6 10*3/uL — ABNORMAL HIGH (ref 4.0–10.5)
nRBC: 0 % (ref 0.0–0.2)

## 2022-12-17 LAB — EXPECTORATED SPUTUM ASSESSMENT W GRAM STAIN, RFLX TO RESP C

## 2022-12-17 LAB — BRAIN NATRIURETIC PEPTIDE: B Natriuretic Peptide: 124.6 pg/mL — ABNORMAL HIGH (ref 0.0–100.0)

## 2022-12-17 LAB — BASIC METABOLIC PANEL
Anion gap: 11 (ref 5–15)
BUN: 25 mg/dL — ABNORMAL HIGH (ref 8–23)
CO2: 26 mmol/L (ref 22–32)
Calcium: 9.4 mg/dL (ref 8.9–10.3)
Chloride: 101 mmol/L (ref 98–111)
Creatinine, Ser: 1 mg/dL (ref 0.44–1.00)
GFR, Estimated: 60 mL/min — ABNORMAL LOW (ref >60)
Glucose, Bld: 110 mg/dL — ABNORMAL HIGH (ref 70–99)
Potassium: 4 mmol/L (ref 3.5–5.1)
Sodium: 138 mmol/L (ref 135–145)

## 2022-12-17 LAB — LACTIC ACID, PLASMA
Lactic Acid, Venous: 2.3 mmol/L (ref 0.5–1.9)
Lactic Acid, Venous: 2 mmol/L (ref 0.5–1.9)

## 2022-12-17 LAB — SEDIMENTATION RATE: Sed Rate: 11 mm/hr (ref 0–22)

## 2022-12-17 LAB — GLUCOSE, CAPILLARY: Glucose-Capillary: 170 mg/dL — ABNORMAL HIGH (ref 70–99)

## 2022-12-17 LAB — PROCALCITONIN: Procalcitonin: <0.1 ng/mL

## 2022-12-17 LAB — CBG MONITORING, ED: Glucose-Capillary: 126 mg/dL — ABNORMAL HIGH (ref 70–99)

## 2022-12-17 LAB — LACTATE DEHYDROGENASE: LDH: 541 U/L — ABNORMAL HIGH (ref 98–192)

## 2022-12-17 LAB — C-REACTIVE PROTEIN: CRP: 2 mg/dL — ABNORMAL HIGH (ref <1.0)

## 2022-12-17 LAB — MRSA NEXT GEN BY PCR, NASAL: MRSA by PCR Next Gen: NOT DETECTED

## 2022-12-17 LAB — FERRITIN: Ferritin: 459 ng/mL — ABNORMAL HIGH (ref 11–307)

## 2022-12-17 LAB — SARS CORONAVIRUS 2 BY RT PCR: SARS Coronavirus 2 by RT PCR: NEGATIVE

## 2022-12-17 LAB — CK: Total CK: 33 U/L — ABNORMAL LOW (ref 38–234)

## 2022-12-17 MED ORDER — INSULIN ASPART 100 UNIT/ML IJ SOLN
0.0000 [IU] | Freq: Three times a day (TID) | INTRAMUSCULAR | Status: DC
  Administered 2022-12-17 – 2022-12-18 (×2): 1 [IU] via SUBCUTANEOUS
  Administered 2022-12-18: 2 [IU] via SUBCUTANEOUS
  Administered 2022-12-19 (×3): 1 [IU] via SUBCUTANEOUS
  Administered 2022-12-20 (×2): 2 [IU] via SUBCUTANEOUS
  Administered 2022-12-20 – 2022-12-21 (×2): 1 [IU] via SUBCUTANEOUS
  Administered 2022-12-21: 2 [IU] via SUBCUTANEOUS
  Administered 2022-12-21: 3 [IU] via SUBCUTANEOUS
  Administered 2022-12-22: 2 [IU] via SUBCUTANEOUS
  Administered 2022-12-22: 3 [IU] via SUBCUTANEOUS
  Administered 2022-12-22: 2 [IU] via SUBCUTANEOUS
  Administered 2022-12-23: 3 [IU] via SUBCUTANEOUS

## 2022-12-17 MED ORDER — ACETAMINOPHEN 650 MG RE SUPP: 650.0000 mg | Freq: Four times a day (QID) | RECTAL | Status: DC | PRN

## 2022-12-17 MED ORDER — DOFETILIDE 500 MCG PO CAPS
500.0000 ug | ORAL_CAPSULE | Freq: Two times a day (BID) | ORAL | Status: DC
  Filled 2022-12-17: qty 1

## 2022-12-17 MED ORDER — APIXABAN 5 MG PO TABS
5.0000 mg | ORAL_TABLET | Freq: Two times a day (BID) | ORAL | Status: DC
  Administered 2022-12-17 – 2022-12-23 (×13): 5 mg via ORAL
  Filled 2022-12-17 (×13): qty 1

## 2022-12-17 MED ORDER — HYDROCODONE-ACETAMINOPHEN 5-325 MG PO TABS
1.0000 | ORAL_TABLET | Freq: Four times a day (QID) | ORAL | Status: DC | PRN
  Administered 2022-12-17 – 2022-12-23 (×16): 1 via ORAL
  Filled 2022-12-17 (×17): qty 1

## 2022-12-17 MED ORDER — ALBUTEROL SULFATE (2.5 MG/3ML) 0.083% IN NEBU
2.5000 mg | INHALATION_SOLUTION | RESPIRATORY_TRACT | Status: DC | PRN
  Administered 2022-12-18 – 2022-12-23 (×2): 2.5 mg via RESPIRATORY_TRACT
  Filled 2022-12-17 (×2): qty 3

## 2022-12-17 MED ORDER — METHYLPREDNISOLONE SODIUM SUCC 125 MG IJ SOLR
125.0000 mg | Freq: Two times a day (BID) | INTRAMUSCULAR | Status: DC
  Administered 2022-12-17 – 2022-12-19 (×5): 125 mg via INTRAVENOUS
  Filled 2022-12-17 (×5): qty 2

## 2022-12-17 MED ORDER — SODIUM CHLORIDE 0.9 % IV SOLN: Freq: Once | INTRAVENOUS | Status: AC

## 2022-12-17 MED ORDER — GUAIFENESIN ER 600 MG PO TB12
600.0000 mg | ORAL_TABLET | Freq: Two times a day (BID) | ORAL | Status: DC
  Administered 2022-12-17: 600 mg via ORAL
  Filled 2022-12-17: qty 1

## 2022-12-17 MED ORDER — SODIUM CHLORIDE 0.9 % IV SOLN
2.0000 g | Freq: Two times a day (BID) | INTRAVENOUS | Status: DC
  Administered 2022-12-17 – 2022-12-21 (×9): 2 g via INTRAVENOUS
  Filled 2022-12-17 (×9): qty 12.5

## 2022-12-17 MED ORDER — VANCOMYCIN HCL 750 MG/150ML IV SOLN
750.0000 mg | INTRAVENOUS | Status: DC
  Filled 2022-12-17: qty 150

## 2022-12-17 MED ORDER — LORAZEPAM 1 MG PO TABS
1.0000 mg | ORAL_TABLET | Freq: Every day | ORAL | Status: DC | PRN
  Administered 2022-12-18 – 2022-12-23 (×4): 1 mg via ORAL
  Filled 2022-12-17 (×4): qty 1

## 2022-12-17 MED ORDER — ONDANSETRON HCL 4 MG PO TABS: 4.0000 mg | ORAL_TABLET | Freq: Four times a day (QID) | ORAL | Status: DC | PRN

## 2022-12-17 MED ORDER — ACETAMINOPHEN 325 MG PO TABS
650.0000 mg | ORAL_TABLET | Freq: Four times a day (QID) | ORAL | Status: DC | PRN
  Administered 2022-12-17 – 2022-12-20 (×2): 650 mg via ORAL
  Filled 2022-12-17 (×2): qty 2

## 2022-12-17 MED ORDER — ONDANSETRON HCL 4 MG/2ML IJ SOLN: 4.0000 mg | Freq: Four times a day (QID) | INTRAMUSCULAR | Status: DC | PRN

## 2022-12-17 MED ORDER — CHLORHEXIDINE GLUCONATE CLOTH 2 % EX PADS
6.0000 | MEDICATED_PAD | Freq: Every day | CUTANEOUS | Status: DC
  Administered 2022-12-17: 6 via TOPICAL

## 2022-12-17 MED ORDER — SODIUM CHLORIDE 0.9% FLUSH
3.0000 mL | Freq: Two times a day (BID) | INTRAVENOUS | Status: DC
  Administered 2022-12-17 – 2022-12-23 (×13): 3 mL via INTRAVENOUS

## 2022-12-17 MED ORDER — INSULIN ASPART 100 UNIT/ML IJ SOLN: 0.0000 [IU] | Freq: Every day | INTRAMUSCULAR | Status: DC

## 2022-12-17 MED ORDER — VANCOMYCIN HCL 1500 MG/300ML IV SOLN
1500.0000 mg | Freq: Once | INTRAVENOUS | Status: AC
  Administered 2022-12-17: 1500 mg via INTRAVENOUS
  Filled 2022-12-17: qty 300

## 2022-12-17 MED ORDER — DOFETILIDE 500 MCG PO CAPS
500.0000 ug | ORAL_CAPSULE | Freq: Two times a day (BID) | ORAL | Status: DC
  Administered 2022-12-17 – 2022-12-24 (×14): 500 ug via ORAL
  Filled 2022-12-17 (×16): qty 1

## 2022-12-17 MED ORDER — BRIMONIDINE TARTRATE 0.2 % OP SOLN
1.0000 [drp] | Freq: Two times a day (BID) | OPHTHALMIC | Status: DC
  Administered 2022-12-17 – 2022-12-23 (×13): 1 [drp] via OPHTHALMIC
  Filled 2022-12-17: qty 5

## 2022-12-17 MED ORDER — ATORVASTATIN CALCIUM 40 MG PO TABS
40.0000 mg | ORAL_TABLET | Freq: Every day | ORAL | Status: DC
  Administered 2022-12-17 – 2022-12-23 (×7): 40 mg via ORAL
  Filled 2022-12-17 (×7): qty 1

## 2022-12-17 MED ORDER — DILTIAZEM HCL ER COATED BEADS 240 MG PO CP24
240.0000 mg | ORAL_CAPSULE | Freq: Every day | ORAL | Status: DC
  Administered 2022-12-17 – 2022-12-23 (×7): 240 mg via ORAL
  Filled 2022-12-17 (×8): qty 1

## 2022-12-17 NOTE — ED Notes (Signed)
ED TO INPATIENT HANDOFF REPORT  ED Nurse Name and Phone #:  Theophilus Bones [phone]  S Name/Age/Gender [name] 72 y.o. female Room/Bed: 020C/020C  Code Status   Code Status: Full Code  Home/SNF/Other Home Patient oriented to: self, place, time, and situation Is this baseline? Yes   Triage Complete: Triage complete  Chief Complaint Pulmonary fibrosis [J84.10]  Triage Note Pt present to ED from doctor's office with c/o shortness of breath and low oxygen saturation reading. Pt denies chest pain at this time, but feels like her heart is in a-fib. Pt A&Ox4 at this time.    Allergies Allergies  Allergen Reactions   Oxycodone Hcl Itching   Levaquin [Levofloxacin In D5w] Itching and Rash   Percocet [Oxycodone-Acetaminophen] Itching    Level of Care/Admitting Diagnosis ED Disposition     ED Disposition  Admit   Condition  --   Comment  Hospital Area: [facility] [[id number]]  Level of Care: Progressive [102]  Admit to Progressive based on following criteria: RESPIRATORY PROBLEMS hypoxemic/hypercapnic respiratory failure that is responsive to NIPPV (BiPAP) or High Flow Nasal Cannula (6-80 lpm). Frequent assessment/intervention, no > Q2 hrs < Q4 hrs, to maintain oxygenation and pulmonary hygiene.  May admit patient to [facility] or [facility] if equivalent level of care is available:: No  Covid Evaluation: Asymptomatic - no recent exposure (last 10 days) testing not required  Diagnosis: Pulmonary fibrosis [829562]  Admitting Physician: Clydie Braun [[id number]]  Attending Physician: Clydie Braun [[id number]]  Certification:: I certify this patient will need inpatient services for at least 2 midnights  Estimated Length of Stay: 2          B Medical/Surgery History Past Medical History:  Diagnosis Date   Chronic diastolic CHF (congestive heart failure)    Chronic respiratory failure    Coronary atherosclerosis    a. 2V by CT 10/2017.   Emphysema  of lung    Hypercholesterolemia    Hypertension    NSIP (nonspecific interstitial pneumonia)    Osteoarthritis    Persistent atrial fibrillation    Thoracic aortic ectasia    Past Surgical History:  Procedure Laterality Date   BIOPSY  12/10/2021   Procedure: BIOPSY;  Surgeon: Meridee Score Netty Starring., MD;  Location: [facility];  Service: Gastroenterology;;   CARDIOVERSION N/A 07/25/2016   Procedure: CARDIOVERSION;  Surgeon: Pricilla Riffle, MD;  Location: [facility] ENDOSCOPY;  Service: Cardiovascular;  Laterality: N/A;   CARDIOVERSION N/A 09/14/2016   Procedure: CARDIOVERSION;  Surgeon: Hillis Range, MD;  Location: [facility] OR;  Service: Cardiovascular;  Laterality: N/A;   CATARACT EXTRACTION Bilateral    ESOPHAGOGASTRODUODENOSCOPY (EGD) WITH PROPOFOL N/A 12/10/2021   Procedure: ESOPHAGOGASTRODUODENOSCOPY (EGD) WITH PROPOFOL;  Surgeon: Lemar Lofty., MD;  Location: [facility] ENDOSCOPY;  Service: Gastroenterology;  Laterality: N/A;   EUS N/A 12/10/2021   Procedure: UPPER ENDOSCOPIC ULTRASOUND (EUS) RADIAL;  Surgeon: Lemar Lofty., MD;  Location: [facility] ENDOSCOPY;  Service: Gastroenterology;  Laterality: N/A;   FINE NEEDLE ASPIRATION  12/10/2021   Procedure: FINE NEEDLE ASPIRATION;  Surgeon: Meridee Score Netty Starring., MD;  Location: [facility] ENDOSCOPY;  Service: Gastroenterology;;   LEG SURGERY Right    TEE WITHOUT CARDIOVERSION N/A 07/25/2016   Procedure: TRANSESOPHAGEAL ECHOCARDIOGRAM (TEE);  Surgeon: Pricilla Riffle, MD;  Location: [facility] ENDOSCOPY;  Service: Cardiovascular;  Laterality: N/A;     A IV Location/Drains/Wounds Patient Lines/Drains/Airways Status     Active Line/Drains/Airways     Name Placement date Placement time Site Days  Peripheral IV 12/09/2022 20 G 1" Left Antecubital 12/12/2022  1100  Antecubital  less than 1            Intake/Output Last 24 hours  Intake/Output Summary (Last 24 hours) at 12/22/2022 1804 Last data filed at 12/11/2022 1650 Gross per 24 hour  Intake 400 ml   Output --  Net 400 ml    Labs/Imaging Results for orders placed or performed during the hospital encounter of 12/15/2022 (from the past 48 hour(s))  SARS Coronavirus 2 by RT PCR (hospital order, performed in [facility] hospital lab) *cepheid single result test* Anterior Nasal Swab     Status: None   Collection Time: 12/19/2022 10:03 AM   Specimen: Anterior Nasal Swab  Result Value Ref Range   SARS Coronavirus 2 by RT PCR NEGATIVE NEGATIVE    Comment: Performed at [facility], [address], [place], [place] [address]  Basic metabolic panel     Status: Abnormal   Collection Time: 12/20/2022 10:20 AM  Result Value Ref Range   Sodium 138 135 - 145 mmol/L   Potassium 4.0 3.5 - 5.1 mmol/L   Chloride 101 98 - 111 mmol/L   CO2 26 22 - 32 mmol/L   Glucose, Bld 110 (H) 70 - 99 mg/dL    Comment: Glucose reference range applies only to samples taken after fasting for at least 8 hours.   BUN 25 (H) 8 - 23 mg/dL   Creatinine, Ser 6.04 0.44 - 1.00 mg/dL   Calcium 9.4 8.9 - 54.0 mg/dL   GFR, Estimated 60 (L) >60 mL/min    Comment: (NOTE) Calculated using the CKD-EPI Creatinine Equation (2021)    Anion gap 11 5 - 15    Comment: Performed at [facility], [address] [place], [place] [address]  CBC with Differential     Status: Abnormal   Collection Time: 11/28/2022 10:20 AM  Result Value Ref Range   WBC 18.6 (H) 4.0 - 10.5 K/uL   RBC 4.33 3.87 - 5.11 MIL/uL   Hemoglobin 13.0 12.0 - 15.0 g/dL   HCT 14.7 82.9 - 56.2 %   MCV 91.2 80.0 - 100.0 fL   MCH 30.0 26.0 - 34.0 pg   MCHC 32.9 30.0 - 36.0 g/dL   RDW 13.0 (H) 86.5 - 78.4 %   Platelets 156 150 - 400 K/uL   nRBC 0.0 0.0 - 0.2 %   Neutrophils Relative % 79 %   Neutro Abs 14.8 (H) 1.7 - 7.7 K/uL   Lymphocytes Relative 8 %   Lymphs Abs 1.4 0.7 - 4.0 K/uL   Monocytes Relative 9 %   Monocytes Absolute 1.6 (H) 0.1 - 1.0 K/uL   Eosinophils Relative 2 %   Eosinophils Absolute 0.4 0.0 - 0.5 K/uL   Basophils Relative 0 %    Basophils Absolute 0.1 0.0 - 0.1 K/uL   Immature Granulocytes 2 %   Abs Immature Granulocytes 0.36 (H) 0.00 - 0.07 K/uL    Comment: Performed at [facility], [address], [place], [place] [address]  Brain natriuretic peptide     Status: Abnormal   Collection Time: 11/26/2022 10:20 AM  Result Value Ref Range   B Natriuretic Peptide 124.6 (H) 0.0 - 100.0 pg/mL    Comment: Performed at [facility], [address], [place], [place] [address]  Lactic acid, plasma     Status: Abnormal   Collection Time: 12/19/2022 10:20 AM  Result Value Ref  Range   Lactic Acid, Venous 2.3 (HH) 0.5 - 1.9 mmol/L    Comment: CRITICAL RESULT CALLED TO, READ BACK BY AND VERIFIED WITH C.Silena Wyss RN 1157 12/23/2022 MCCORMICK K Performed at [facility], [address], [place], [place] [address]   I-Stat venous blood gas, [facility] ED, [facility], [facility])     Status: Abnormal   Collection Time: 12/03/2022 10:37 AM  Result Value Ref Range   pH, Ven 7.441 (H) 7.25 - 7.43   pCO2, Ven 43.9 (L) 44 - 60 mmHg   pO2, Ven 30 (LL) 32 - 45 mmHg   Bicarbonate 29.9 (H) 20.0 - 28.0 mmol/L   TCO2 31 22 - 32 mmol/L   O2 Saturation 59 %   Acid-Base Excess 5.0 (H) 0.0 - 2.0 mmol/L   Sodium 133 (L) 135 - 145 mmol/L   Potassium 7.3 (HH) 3.5 - 5.1 mmol/L   Calcium, Ion 1.09 (L) 1.15 - 1.40 mmol/L   HCT 41.0 36.0 - 46.0 %   Hemoglobin 13.9 12.0 - 15.0 g/dL   Sample type VENOUS    Comment NOTIFIED PHYSICIAN   Lactic acid, plasma     Status: Abnormal   Collection Time: 12/04/2022 12:10 PM  Result Value Ref Range   Lactic Acid, Venous 2.0 (HH) 0.5 - 1.9 mmol/L    Comment: CRITICAL VALUE NOTED. VALUE IS CONSISTENT WITH PREVIOUSLY REPORTED/CALLED VALUE Performed at [facility], [address] [place], [place] [address]   CK     Status: Abnormal   Collection Time: 12/09/2022  2:30 PM  Result Value Ref Range   Total CK 33 (L) 38 - 234 U/L    Comment: HEMOLYSIS AT THIS LEVEL MAY AFFECT RESULT Performed at [facility],  [address] [place], [place] [address]   Sedimentation rate     Status: None   Collection Time: 12/16/2022  2:30 PM  Result Value Ref Range   Sed Rate 11 0 - 22 mm/hr    Comment: Performed at [facility], [address], [place], [place] [address]  C-reactive protein     Status: Abnormal   Collection Time: 11/27/2022  2:30 PM  Result Value Ref Range   CRP 2.0 (H) <1.0 mg/dL    Comment: Performed at [facility], [address], [place], [place] [address]  Ferritin     Status: Abnormal   Collection Time: 11/30/2022  2:30 PM  Result Value Ref Range   Ferritin 459 (H) 11 - 307 ng/mL    Comment: Performed at [facility], [address], [place], [place] [address]  Lactate dehydrogenase     Status: Abnormal   Collection Time: 12/22/2022  2:30 PM  Result Value Ref Range   LDH 541 (H) 98 - 192 U/L    Comment: HEMOLYSIS AT THIS LEVEL MAY AFFECT RESULT Performed at [facility], [address] [place], [place] [address]   CBG monitoring, ED     Status: Abnormal   Collection Time: 12/20/2022  5:22 PM  Result Value Ref Range   Glucose-Capillary 126 (H) 70 - 99 mg/dL    Comment: Glucose reference range applies only to samples taken after fasting for at least 8 hours.   CT Chest Wo Contrast  Result Date: 12/12/2022 CLINICAL DATA:  Pneumonia, worsening dyspnea, productive cough EXAM: CT CHEST WITHOUT CONTRAST TECHNIQUE: Multidetector CT imaging of the chest was performed following the standard protocol without IV contrast. RADIATION DOSE REDUCTION: This  exam was performed according to the departmental dose-optimization program which includes automated exposure control, adjustment of the mA and/or kV according to patient size and/or use of iterative reconstruction technique. 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 There is ectasia of the inferior course of thoracic aorta measuring 4.6 cm in diameter. Ascending thoracic aorta measures 3.9 cm. There are scattered calcifications in thoracic aorta and its major branches. Coronary artery calcifications are seen. Mediastinum/Nodes: No new significant lymphadenopathy is seen in mediastinum. Lungs/Pleura: There is ectasia of bronchi. Increased interstitial markings are seen in the periphery of both lungs, more so in the lower lung fields. Patchy ground-glass densities are seen in the periphery of both lungs. There is interval decrease in patchy alveolar infiltrates in both lungs. There is no pleural effusion or pneumothorax. Upper Abdomen: Visualized upper abdomen is unremarkable. Musculoskeletal: No acute findings are seen. IMPRESSION: Pulmonary fibrosis. Bronchiectasis. There are patchy ground-glass densities in the periphery of both lungs, more so in the lower lung fields suggesting possible superimposed interstitial pneumonia. There is interval decrease in patchy alveolar infiltrates in both lungs in comparison with the CT done on 11/28/2022. There is no pleural effusion or pneumothorax. 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 Coronary artery calcifications are seen. Electronically Signed   By: Ernie Avena M.D.   On: 12/16/2022 11:47    Pending Labs Unresulted Labs (From admission, onward)     Start     Ordered   12/18/22 0500  CBC  Tomorrow morning,   R        12/09/2022 1503   12/18/22 0500  Basic metabolic panel  Tomorrow morning,   R        11/30/2022 1503   12/14/2022 1800  Aldolase  Once,   R        12/11/2022 1800   12/14/2022 1800  ANA, IFA (with reflex)  Once,   R        11/28/2022 1800   12/08/2022 1800  ANCA Profile  Once,   R        12/21/2022 1800   12/05/2022 1800  C3 complement  Once,   R        12/08/2022 1800   12/10/2022 1800  C4 complement  Once,   R        12/16/2022 1800   12/14/2022 1800   CYCLIC CITRUL PEPTIDE ANTIBODY, IGG/IGA  Once,   R        12/20/2022 1800   11/25/2022 1800  Hypersensitivity Pneumonitis  Once,   R        11/25/2022 1800   12/16/2022 1800  Rheumatoid factor  Once,   R        12/12/2022 1800   12/22/2022 1800  Anti-scleroderma antibody  Once,   R        12/02/2022 1800   12/19/2022 1502  Procalcitonin  Once,   R       References:    Procalcitonin Lower Respiratory Tract Infection AND Sepsis Procalcitonin Algorithm   12/21/2022 1503   12/08/2022 1352  Strep pneumoniae urinary antigen  Once,   R        12/09/2022 1351   12/22/2022 1352  Legionella Pneumophila Serogp 1 Ur Ag  Once,   R        12/06/2022 1351   12/14/2022 1349  Expectorated Sputum Assessment w Gram Stain, Rflx to Resp Cult  Once,   R        12/10/2022 1348   12/16/2022 1225  MRSA Next Gen by PCR, Nasal  (MRSA Screening)  ONCE - URGENT,   URGENT        12/04/2022 1224            Vitals/Pain Today's Vitals   12/06/2022 1500 12/08/2022 1530 12/05/2022 1600 12/21/2022 1717  BP: 124/64 (!) 92/52 (!) 129/58 123/67  Pulse: 78 85 75   Resp: (!) 26 (!) 32 (!) 30   Temp:      TempSrc:      SpO2: 93% 95% 94%   Weight:      Height:      PainSc:        Isolation Precautions No active isolations  Medications Medications  HYDROcodone-acetaminophen (NORCO/VICODIN) 5-325 MG per tablet 1 tablet (1 tablet Oral Given 11/28/2022 1232)  ceFEPIme (MAXIPIME) 2 g in sodium chloride 0.9 % 100 mL IVPB (0 g Intravenous Stopped 12/02/2022 1320)  vancomycin (VANCOREADY) IVPB 750 mg/150 mL (has no administration in time range)  methylPREDNISolone sodium succinate (SOLU-MEDROL) 125 mg/2 mL injection 125 mg (125 mg Intravenous Given 12/07/2022 1417)  atorvastatin (LIPITOR) tablet 40 mg (has no administration in time range)  diltiazem (CARDIZEM CD) 24 hr capsule 240 mg (240 mg Oral Given 12/22/2022 1717)  apixaban (ELIQUIS) tablet 5 mg (has no administration in time range)  sodium chloride flush (NS) 0.9 % injection 3 mL (3 mLs Intravenous Not Given  12/01/2022 1515)  acetaminophen (TYLENOL) tablet 650 mg (650 mg Oral Given 12/04/2022 1717)    Or  acetaminophen (TYLENOL) suppository 650 mg ( Rectal See Alternative 12/18/2022 1717)  ondansetron (ZOFRAN) tablet 4 mg (has no administration in time range)    Or  ondansetron (ZOFRAN) injection 4 mg (has no administration in time range)  guaiFENesin (MUCINEX) 12 hr tablet 600 mg (has no administration in time range)  albuterol (PROVENTIL) (2.5 MG/3ML) 0.083% nebulizer solution 2.5 mg (has no administration in time range)  brimonidine (ALPHAGAN) 0.2 % ophthalmic solution 1 drop (has no administration in time range)  LORazepam (ATIVAN) tablet 1 mg (has no administration in time range)  insulin aspart (novoLOG) injection 0-9 Units (1 Units Subcutaneous Given 12/16/2022 1730)  insulin aspart (novoLOG) injection 0-5 Units (has no administration in time range)  dofetilide (TIKOSYN) capsule 500 mcg (500 mcg Oral Given 12/14/2022 1723)  0.9 %  sodium chloride infusion ( Intravenous New Bag/Given 12/16/2022 1241)  vancomycin (VANCOREADY) IVPB 1500 mg/300 mL (0 mg Intravenous Stopped 12/06/2022 1650)    Mobility walks     Focused Assessments Pulmonary Assessment Handoff:  Lung sounds: Bilateral Breath Sounds: Clear L Breath Sounds: Clear R Breath Sounds: Clear O2 Device: Nasal Cannula O2 Flow Rate (L/min): 5 L/min    R Recommendations: See Admitting Provider Note  Report given to:   Additional Notes:

## 2022-12-17 NOTE — Progress Notes (Signed)
1910 patient arrived from ED alert x4 on 8L [place] increased to 11 to get SP02 above 90 patient soaked in urine bath and chg completed all linen changed external cath placed.  12/18/22 0200 Lokelma given for K of 6.1 0500 incentive spirometry and flutter vale given to patient and how to use them reviewed with patient so that patient understands, teaching successful

## 2022-12-17 NOTE — Plan of Care (Signed)
  Problem: Education: Goal: Ability to describe self-care measures that may prevent or decrease complications (Diabetes Survival Skills Education) will improve Outcome: Not Progressing Goal: Individualized Educational Video(s) Outcome: Not Progressing   Problem: Coping: Goal: Ability to adjust to condition or change in health will improve Outcome: Not Progressing   Problem: Fluid Volume: Goal: Ability to maintain a balanced intake and output will improve Outcome: Not Progressing   Problem: Health Behavior/Discharge Planning: Goal: Ability to identify and utilize available resources and services will improve Outcome: Not Progressing Goal: Ability to manage health-related needs will improve Outcome: Not Progressing   Problem: Metabolic: Goal: Ability to maintain appropriate glucose levels will improve Outcome: Not Progressing   Problem: Skin Integrity: Goal: Risk for impaired skin integrity will decrease Outcome: Not Progressing   Problem: Tissue Perfusion: Goal: Adequacy of tissue perfusion will improve Outcome: Not Progressing   Problem: Education: Goal: Knowledge of General Education information will improve Description: Including pain rating scale, medication(s)/side effects and non-pharmacologic comfort measures Outcome: Not Progressing   Problem: Health Behavior/Discharge Planning: Goal: Ability to manage health-related needs will improve Outcome: Not Progressing   Problem: Clinical Measurements: Goal: Ability to maintain clinical measurements within normal limits will improve Outcome: Not Progressing Goal: Will remain free from infection Outcome: Not Progressing Goal: Diagnostic test results will improve Outcome: Not Progressing Goal: Respiratory complications will improve Outcome: Not Progressing Goal: Cardiovascular complication will be avoided Outcome: Not Progressing   Problem: Activity: Goal: Risk for activity intolerance will decrease Outcome: Not  Progressing   Problem: Nutrition: Goal: Adequate nutrition will be maintained Outcome: Not Progressing   Problem: Coping: Goal: Level of anxiety will decrease Outcome: Not Progressing   Problem: Elimination: Goal: Will not experience complications related to bowel motility Outcome: Not Progressing Goal: Will not experience complications related to urinary retention Outcome: Not Progressing   Problem: Pain Managment: Goal: General experience of comfort will improve Outcome: Not Progressing   Problem: Safety: Goal: Ability to remain free from injury will improve Outcome: Not Progressing   Problem: Skin Integrity: Goal: Risk for impaired skin integrity will decrease Outcome: Not Progressing   Problem: Activity: Goal: Ability to tolerate increased activity will improve Outcome: Not Progressing   Problem: Clinical Measurements: Goal: Ability to maintain a body temperature in the normal range will improve Outcome: Not Progressing   Problem: Respiratory: Goal: Ability to maintain adequate ventilation will improve Outcome: Not Progressing Goal: Ability to maintain a clear airway will improve Outcome: Not Progressing\ Patient recently hospitalized for pna and resp failure requiring ,more oxygen decreasing mobility

## 2022-12-17 NOTE — ED Provider Notes (Addendum)
[facility] Provider Note   CSN: [id number] Arrival date & time: 11/28/2022  4098     History  Chief Complaint  Patient presents with   Shortness of Breath    [name] is a 72 y.o. female with a complicated medical history includes coronary disease, persistent A-fib, diastolic congestive heart failure, COPD, pulmonary fibrosis, on 5 L nasal cannula at baseline, presenting to the ED with complaint of dyspnea and low oxygen levels.  Supplemental history is provided by EMS.  They were called out to the physician's office as there was concern that the patient had a "low oxygen saturations" in the 80's despite using home 5L [place].  She had been complaining of continued labored breathing she was discharged from the hospital 5 days ago.  She says she is continuing to have productive cough since coming home from the hospital.  She does not feel better.  She reports that she was sent home on a steroid taper which she has taken every day except for it this morning.  Per my review of the medical records, the patient was hospitalized on April 4th and discharged on April 18th 2024 for respiratory failure felt to be secondary to pulmonary fibrosis and community acquired pneumonia.  She had a complete sepsis workup with her blood cultures that did not grow in any bacteria.  Urine culture positive for proteus mirabilis that was treated with Rocephin. She was treated also for CAP with 5 days of Rocephin and doxycycline in the hospital, and per pulmonary recommendations, was discharged on a prednisone taper.  She did require up to 20 L of high flow oxygen by nasal cannula for progressively worsening dyspnea in the hospital, but was able to be discharged home on 5 L nasal cannula.  She also had an echocardiogram performed 3 weeks ago, showed an EF of 65% with grade 1 diastolic dysfunction, no Lasix and irbesartan were stopped in the hospital due to low blood pressures.   She was initially diuresed in her hospital stay with IV Lasix.  She was noted to have atrial fibrillation that is rate controlled with Tikosyn and diltiazem, anticoagulated with Eliquis.  She had a CT PE study that was negative for acute PE during her last hospital stay.  She is known to have thoracic aortic ectasia.  HPI     Home Medications Prior to Admission medications   Medication Sig Start Date End Date Taking? Authorizing Provider  ACCU-CHEK GUIDE test strip See admin instructions. 07/31/20   [provider]  acetaminophen (TYLENOL) 500 MG tablet Take 500 mg by mouth every 6 (six) hours as needed for moderate pain.    [provider]  apixaban (ELIQUIS) 5 MG TABS tablet Take 1 tablet (5 mg total) by mouth 2 (two) times daily. 09/27/22   Newman Nip, NP  ascorbic acid (VITAMIN C) 500 MG tablet Take 500 mg by mouth every other day.    [provider]  atorvastatin (LIPITOR) 40 MG tablet Take 1 tablet (40 mg total) by mouth daily. 07/30/22   Sharlene Dory, PA-C  brimonidine (ALPHAGAN) 0.2 % ophthalmic solution Place 1 drop into both eyes 2 (two) times daily. 05/11/20   [provider]  calcium citrate-vitamin D (CITRACAL+D) 315-200 MG-UNIT tablet Take 1 tablet by mouth daily.    [provider]  cholecalciferol (VITAMIN D3) 25 MCG (1000 UNIT) tablet Take 1,000 Units by mouth every other day.    [provider]  clotrimazole-betamethasone (LOTRISONE) cream Apply 1 Application topically 2 (two) times daily. Patient not taking: Reported on 11/29/2022 12/17/21   [provider]  diltiazem (CARDIZEM CD) 240 MG 24 hr capsule Take 1 capsule (240 mg total) by mouth daily. 05/09/22   Corky Crafts, MD  dofetilide (TIKOSYN) 500 MCG capsule TAKE ONE CAPSULE BY MOUTH TWICE DAILY 08/05/22   Newman Nip, NP  [facility] BILOBA COMPLEX PO Take 1 tablet by mouth every other day.    [provider]  HYDROcodone-acetaminophen  (NORCO/VICODIN) 5-325 MG tablet Take 1 tablet by mouth every 6 (six) hours as needed for moderate pain.    [provider]  LORazepam (ATIVAN) 1 MG tablet Take 1 mg by mouth daily as needed for anxiety. 01/05/16   [provider]  Multiple Vitamin (MULTIVITAMIN) capsule Take 1 capsule by mouth daily.    [provider]  Multiple Vitamins-Minerals (HAIR SKIN NAILS PO) Take 1 tablet by mouth every other day.    [provider]  OXYGEN 2lpm 24/7 and 4 lpm with exertion [facility]    [provider]  Oxymetazoline HCl (SINEX ULTRA FINE MIST 12-HOUR NA) Place 1 spray into the nose daily as needed (congestion).    [provider]  predniSONE (DELTASONE) 5 MG tablet 20 mg daily for 1 week, followed by 15 mg daily for 1 week followed by 10 mg daily for 1 week followed by 5 mg daily for 1 week then stop 12/13/22   Dahal, Melina Schools, MD  ROCKLATAN 0.02-0.005 % SOLN Place 1 drop into both eyes at bedtime. 01/15/21   [provider]  UNABLE TO FIND BIPAP with 2lpm o2 at night  [facility]    [provider]      Allergies    Oxycodone hcl, Levaquin [levofloxacin in d5w], and Percocet [oxycodone-acetaminophen]    Review of Systems   Review of Systems  Physical Exam Updated Vital Signs BP 128/77   Pulse 90   Temp 97.8 F (36.6 C) (Oral)   Resp (!) 33   Ht 5' (1.524 m)   Wt 86.6 kg   SpO2 (!) 89%   BMI 37.30 kg/m  Physical Exam Constitutional:      General: She is not in acute distress. HENT:     Head: Normocephalic and atraumatic.  Eyes:     Conjunctiva/sclera: Conjunctivae normal.     Pupils: Pupils are equal, round, and reactive to light.  Cardiovascular:     Rate and Rhythm: Normal rate. Rhythm irregular.  Pulmonary:     Effort: Pulmonary effort is normal. No respiratory distress.     Comments: Productive cough, diffuse rhonchi in bilateral lung fields, 5L [place] at 91% O2 Abdominal:     General: There is no distension.     Tenderness:  There is no abdominal tenderness.  Skin:    General: Skin is warm and dry.  Neurological:     General: No focal deficit present.     Mental Status: She is alert. Mental status is at baseline.  Psychiatric:        Mood and Affect: Mood normal.        Behavior: Behavior normal.     ED Results / Procedures / Treatments   Labs (all labs ordered are listed, but only abnormal results are displayed) Labs Reviewed  BASIC METABOLIC PANEL - Abnormal; Notable for the following components:      Result Value   Glucose, Bld 110 (*)    BUN  25 (*)    GFR, Estimated 60 (*)    All other components within normal limits  CBC WITH DIFFERENTIAL/PLATELET - Abnormal; Notable for the following components:   WBC 18.6 (*)    RDW 15.6 (*)    Neutro Abs 14.8 (*)    Monocytes Absolute 1.6 (*)    Abs Immature Granulocytes 0.36 (*)    All other components within normal limits  BRAIN NATRIURETIC PEPTIDE - Abnormal; Notable for the following components:   B Natriuretic Peptide 124.6 (*)    All other components within normal limits  LACTIC ACID, PLASMA - Abnormal; Notable for the following components:   Lactic Acid, Venous 2.3 (*)    All other components within normal limits  I-STAT VENOUS BLOOD GAS, ED - Abnormal; Notable for the following components:   pH, Ven 7.441 (*)    pCO2, Ven 43.9 (*)    pO2, Ven 30 (*)    Bicarbonate 29.9 (*)    Acid-Base Excess 5.0 (*)    Sodium 133 (*)    Potassium 7.3 (*)    Calcium, Ion 1.09 (*)    All other components within normal limits  SARS CORONAVIRUS 2 BY RT PCR  LACTIC ACID, PLASMA    EKG EKG Interpretation  Date/Time:  Tuesday December 17 2022 10:11:53 EDT Ventricular Rate:  78 PR Interval:  147 QRS Duration: 81 QT Interval:  368 QTC Calculation: 420 R Axis:   -16 Text Interpretation: Sinus rhythm Atrial premature complexes Prominent P waves, nondiagnostic Borderline left axis deviation Confirmed by Alvester Chou [phone]) on 12/18/2022 10:13:38  AM  Radiology CT Chest Wo Contrast  Result Date: 12/05/2022 CLINICAL DATA:  Pneumonia, worsening dyspnea, productive cough EXAM: CT CHEST WITHOUT CONTRAST TECHNIQUE: Multidetector CT imaging of the chest was performed following the standard protocol without IV contrast. RADIATION DOSE REDUCTION: This exam was performed according to the departmental dose-optimization program which includes automated exposure control, adjustment of the mA and/or kV according to patient size and/or use of iterative reconstruction technique. COMPARISON:  Previous CT done on 11/28/2022, previous chest radiographs including the study done on 12/09/2022 FINDINGS: Cardiovascular: There is aneurysmal dilation in proximal descending thoracic aorta measuring 5.4 x 5.2 cm. There is ectasia of the inferior course of thoracic aorta measuring 4.6 cm in diameter. Ascending thoracic aorta measures 3.9 cm. There are scattered calcifications in thoracic aorta and its major branches. Coronary artery calcifications are seen. Mediastinum/Nodes: No new significant lymphadenopathy is seen in mediastinum. Lungs/Pleura: There is ectasia of bronchi. Increased interstitial markings are seen in the periphery of both lungs, more so in the lower lung fields. Patchy ground-glass densities are seen in the periphery of both lungs. There is interval decrease in patchy alveolar infiltrates in both lungs. There is no pleural effusion or pneumothorax. Upper Abdomen: Visualized upper abdomen is unremarkable. Musculoskeletal: No acute findings are seen. IMPRESSION: Pulmonary fibrosis. Bronchiectasis. There are patchy ground-glass densities in the periphery of both lungs, more so in the lower lung fields suggesting possible superimposed interstitial pneumonia. There is interval decrease in patchy alveolar infiltrates in both lungs in comparison with the CT done on 11/28/2022. There is no pleural effusion or pneumothorax. 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 Coronary artery calcifications are seen. Electronically Signed   By: Ernie Avena M.D.   On: 12/09/2022 11:47    Procedures .Critical Care  Performed by: Terald Sleeper, MD Authorized by: Terald Sleeper, MD   Critical care provider statement:  Critical care time (minutes):  45   Critical care time was exclusive of:  Separately billable procedures and treating other patients   Critical care was necessary to treat or prevent imminent or life-threatening deterioration of the following conditions:  Sepsis   Critical care was time spent personally by me on the following activities:  Ordering and performing treatments and interventions, ordering and review of laboratory studies, ordering and review of radiographic studies, pulse oximetry, review of old charts, examination of patient and evaluation of patient's response to treatment     Medications Ordered in ED Medications  HYDROcodone-acetaminophen (NORCO/VICODIN) 5-325 MG per tablet 1 tablet (has no administration in time range)  0.9 %  sodium chloride infusion (has no administration in time range)    ED Course/ Medical Decision Making/ A&P Clinical Course as of 12/01/2022 1530  Tue Dec 17, 2022  1219 Pulmonary team consulted.  Pt on 7L [place], at 90-91%, plan to admit for possible HCAP (unclear if these interstitial PNA pattern is truly infectious) but with new O2 requirement.  She has a leukocytosis in the setting of chronic steroids, and lactate 2.3  Reasonable to start antibiotics and fluid infusion, slowly, given hx of CHF and need for diuresis in the hospital [MT]  1222 At this point the patient is stable for a stepdown admission, and per my evaluation, she is not requiring intubation or ICU monitoring at this time.  No evidence of septic shock.  Pulmonary critical care evaluation is pending at the time of admission. [MT]  1255 Admitted to hospitalist Dr Katrinka Blazing [MT]    Clinical Course User  Index [MT] Renaye Rakers Kermit Balo, MD                             Medical Decision Making Amount and/or Complexity of Data Reviewed Labs: ordered. Radiology: ordered.  Risk Prescription drug management. Decision regarding hospitalization.   This patient presents to the ED with concern for cough, shortness of breath. This involves an extensive number of treatment options, and is a complaint that carries with it a high risk of complications and morbidity.  The differential diagnosis includes pulmonary edema versus pleural effusion versus recurring pneumonia versus viral URI versus underlying pulmonary fibrosis versus other  Co-morbidities that complicate the patient evaluation: History of pulmonary fibrosis and COPD and congestive heart failure at high risk of pulmonary complications and respiratory failure  Additional history obtained from EMS  External records from outside source obtained and reviewed including hospital course and discharge summary from April 18th  I ordered and personally interpreted labs.  The pertinent results include:  Wbc 18K, lactate 2.3. Ph 7.4, pCO2 43, bicarb 29.  BNP 124.  Covid negative.  Given her complicated pulmonary findings on prior imaging with IPF, I do think a CT scan would be the best way to evaluate her lung fields for recurring or developing pneumonia, and less likely to have utility and plain x-ray.   Tests considered: I do not have any suspicion for pulmonary embolism, and I do not believe that she needs a CT angiogram at this time.  I also have no acute suspicion for aortic dissection.  I ordered imaging studies including CT scan of the lungs I independently visualized and interpreted imaging which showed ongoing pulmonary fibrosis pattern and potential interstitial pneumonia type pattern I agree with the radiologist interpretation  The patient was maintained on a cardiac monitor.  I personally viewed and interpreted the  cardiac monitored which  showed an underlying rhythm of: NSR  Per my interpretation the patient's ECG shows sinus rhythm, no acute ischemic findings  I ordered medication including BS antibiotics for HCAP, NS infusion   I have reviewed the patients home medicines and have made adjustments as needed  I requested consultation with the pulmonary,  and discussed lab and imaging findings as well as pertinent plan - they recommend: consult pending at the time of admission  After the interventions noted above, I reevaluated the patient and found that they have: stayed the same    Dispostion:  After consideration of the diagnostic results and the patients response to treatment, I feel that the patent would benefit from medical admission.         Final Clinical Impression(s) / ED Diagnoses Final diagnoses:  None    Rx / DC Orders ED Discharge Orders     None         Marland Reine, Kermit Balo, MD 12/12/2022 1532    Terald Sleeper, MD 11/26/2022 [phone]

## 2022-12-17 NOTE — Progress Notes (Signed)
Pharmacy Antibiotic Note  [name] is a 72 y.o. female admitted on 12/17/2022 with pneumonia.  Pharmacy has been consulted for Vancomycin and Cefepime dosing.  CrCl 40-50 mL/min which appears baseline  Plan: Vancomycin  IV once then  Q24H (eAUC 496, Scr 1, Vd 0.5) Cefepime 2g Q12H  Height: 5' (152.4 cm) Weight: 86.6 kg (191 lb) IBW/kg (Calculated) : 45.5  Temp (24hrs), Avg:97.8 F (36.6 C), Min:97.8 F (36.6 C), Max:97.8 F (36.6 C)  Recent Labs  Lab 12/11/22 0157 12/12/22 0418 12/17/22 1020  WBC  --  16.5* 18.6*  CREATININE 1.30* 1.38* 1.00  LATICACIDVEN  --   --  2.3*    Estimated Creatinine Clearance: 49.7 mL/min (by C-G formula based on SCr of 1 mg/dL).    Allergies  Allergen Reactions   Oxycodone Hcl Itching   Levaquin [Levofloxacin In D5w] Itching and Rash   Percocet [Oxycodone-Acetaminophen] Itching    Thank you for allowing pharmacy to be a part of this patient's care.  Ellis Savage, PharmD Clinical Pharmacist 12/17/2022 12:29 PM

## 2022-12-17 NOTE — H&P (Signed)
History and Physical    Patient: [name]:[id number] DOB: [birth year] DOA: 12/21/2022 DOS: the patient was seen and examined on 12/20/2022 PCP: [name], MD  Patient coming from: From primary's office via EMS  Chief Complaint:  Chief Complaint  Patient presents with   Shortness of Breath   HPI: [name] is a 72 y.o. female with medical history significant of hypertension, hyperlipidemia, CAD, diastolic CHF, persistent atrial fibrillation on chronic anticoagulation, pulmonary fibrosis with chronic hypoxic respiratory failure on 5 L of oxygen, anxiety, pain who presents with complaints of acute worsening shortness of breath cough since yesterday evening.  Patient had just recently been hospitalized 4/4 through 4/18 with acute on chronic respiratory failure with oxygen sepsis secondary to community-acquired pneumonia in the setting of pulmonary fibrosis, and acute on chronic diastolic congestive heart failure.  After getting home patient reported that she had been doing well with limited cough least 1 week.  However, yesterday evening had started getting more short of breath with ambulation and it progressed.  Normally ambulating with use of a walker.  Denied having any significant fever or chills, nausea, vomiting, leg swelling, or diarrhea symptoms.  She reported having increased cough that was productive and intermittently blood-tinged.  Patient reported that she could feel her heart intermittently racing.  Denied having any chest pain.  She had gone to see her primary care this morning at around 8 AM and was noted to have O2 saturations in the 80s on home oxygen of 5 L.  For which EMS was called  In the emergency department patient was noted to be tachypneic with blood pressures as low as 78/43, and O2 saturations as low as 88% with improvement currently on 7 L.  Labs significant for WBC 18.6, BUN 25, creatinine 1, BNP 124.6, and lactic acid 2.3->2.  CT scan of the chest noted  pulmonary fibrosis with bronchiectasis and patchy groundglass densities in the periphery in both lungs more so in the lower lung fields concerning for possible superimposed interstitial pneumonia.  Review of Systems: As mentioned in the history of present illness. All other systems reviewed and are negative. Past Medical History:  Diagnosis Date   Chronic diastolic CHF (congestive heart failure)    Chronic respiratory failure    Coronary atherosclerosis    a. 2V by CT 10/2017.   Emphysema of lung    Hypercholesterolemia    Hypertension    NSIP (nonspecific interstitial pneumonia)    Osteoarthritis    Persistent atrial fibrillation    Thoracic aortic ectasia    Past Surgical History:  Procedure Laterality Date   BIOPSY  12/10/2021   Procedure: BIOPSY;  Surgeon: Meridee Score Netty Starring., MD;  Location: [facility];  Service: Gastroenterology;;   CARDIOVERSION N/A 07/25/2016   Procedure: CARDIOVERSION;  Surgeon: Pricilla Riffle, MD;  Location: [facility] ENDOSCOPY;  Service: Cardiovascular;  Laterality: N/A;   CARDIOVERSION N/A 09/14/2016   Procedure: CARDIOVERSION;  Surgeon: Hillis Range, MD;  Location: [facility] OR;  Service: Cardiovascular;  Laterality: N/A;   CATARACT EXTRACTION Bilateral    ESOPHAGOGASTRODUODENOSCOPY (EGD) WITH PROPOFOL N/A 12/10/2021   Procedure: ESOPHAGOGASTRODUODENOSCOPY (EGD) WITH PROPOFOL;  Surgeon: Lemar Lofty., MD;  Location: [facility] ENDOSCOPY;  Service: Gastroenterology;  Laterality: N/A;   EUS N/A 12/10/2021   Procedure: UPPER ENDOSCOPIC ULTRASOUND (EUS) RADIAL;  Surgeon: Lemar Lofty., MD;  Location: [facility] ENDOSCOPY;  Service: Gastroenterology;  Laterality: N/A;   FINE NEEDLE ASPIRATION  12/10/2021   Procedure: FINE NEEDLE ASPIRATION;  Surgeon: Meridee Score,  Netty Starring., MD;  Location: [facility]OSCOPY;  Service: Gastroenterology;;   LEG SURGERY Right    TEE WITHOUT CARDIOVERSION N/A 07/25/2016   Procedure: TRANSESOPHAGEAL ECHOCARDIOGRAM (TEE);  Surgeon: Pricilla Riffle,  MD;  Location: [facility] ENDOSCOPY;  Service: Cardiovascular;  Laterality: N/A;   Social History:  reports that she quit smoking about 12 years ago. Her smoking use included cigarettes. She started smoking about 56 years ago. She has a 43.00 pack-year smoking history. She has never used smokeless tobacco. She reports that she does not drink alcohol and does not use drugs.  Allergies  Allergen Reactions   Oxycodone Hcl Itching   Levaquin [Levofloxacin In D5w] Itching and Rash   Percocet [Oxycodone-Acetaminophen] Itching    Family History  Problem Relation Age of Onset   Heart disease Father    Heart attack Sister    Stroke Neg Hx     Prior to Admission medications   Medication Sig Start Date End Date Taking? Authorizing Provider  ACCU-CHEK GUIDE test strip See admin instructions. 07/31/20  Yes [provider]  acetaminophen (TYLENOL) 500 MG tablet Take 500 mg by mouth every 6 (six) hours as needed for moderate pain.   Yes [provider]  alendronate (FOSAMAX) 70 MG tablet Take 70 mg by mouth once a week. 11/28/22  Yes [provider]  apixaban (ELIQUIS) 5 MG TABS tablet Take 1 tablet (5 mg total) by mouth 2 (two) times daily. 09/27/22  Yes Newman Nip, NP  ascorbic acid (VITAMIN C) 500 MG tablet Take 500 mg by mouth every other day.   Yes [provider]  atorvastatin (LIPITOR) 40 MG tablet Take 1 tablet (40 mg total) by mouth daily. 07/30/22  Yes Conte, Tessa N, PA-C  brimonidine (ALPHAGAN) 0.2 % ophthalmic solution Place 1 drop into both eyes 2 (two) times daily. 05/11/20  Yes [provider]  diltiazem (CARDIZEM CD) 240 MG 24 hr capsule Take 1 capsule (240 mg total) by mouth daily. 05/09/22  Yes Corky Crafts, MD  dofetilide (TIKOSYN) 500 MCG capsule TAKE ONE CAPSULE BY MOUTH TWICE DAILY 08/05/22  Yes Newman Nip, NP  HYDROcodone-acetaminophen (NORCO/VICODIN) 5-325 MG tablet Take 1 tablet by mouth every 6 (six) hours as needed for  moderate pain.   Yes [provider]  LORazepam (ATIVAN) 1 MG tablet Take 1 mg by mouth daily as needed for anxiety. 01/05/16  Yes [provider]  OXYGEN 2lpm 24/7 and 4 lpm with exertion AHC   Yes [provider]  predniSONE (DELTASONE) 5 MG tablet 20 mg daily for 1 week, followed by 15 mg daily for 1 week followed by 10 mg daily for 1 week followed by 5 mg daily for 1 week then stop 12/13/22  Yes Dahal, Melina Schools, MD  calcium citrate-vitamin D (CITRACAL+D) 315-200 MG-UNIT tablet Take 1 tablet by mouth daily. Patient not taking: Reported on 12/22/2022    [provider]  cholecalciferol (VITAMIN D3) 25 MCG (1000 UNIT) tablet Take 1,000 Units by mouth every other day. Patient not taking: Reported on 12/14/2022    [provider]  Multiple Vitamin (MULTIVITAMIN) capsule Take 1 capsule by mouth daily. Patient not taking: Reported on 11/29/2022    [provider]  UNABLE TO FIND BIPAP with 2lpm o2 at night  [facility]    [provider]    Physical Exam: Vitals:   11/25/2022 1230 12/07/2022 1300 12/19/2022 1330 12/11/2022 1400  BP: 106/62 (!) 78/43 118/66 121/77  Pulse: 74 77 81  82  Resp: (!) 23 (!) 23 (!) 29 (!) 27  Temp:      TempSrc:      SpO2: 90% 92% 94% 93%  Weight:      Height:        Constitutional: Elderly female who appears to be acutely ill and short of breath Eyes: PERRL, lids and conjunctivae normal ENMT: Mucous membranes are moist.   Neck: normal, supple Respiratory: Tachypneic with inspiratory crackles appreciated.  Patient currently on 8 L of high flow nasal cannula oxygen only able to talk in 2-3 words at a time. Cardiovascular: Regular rate and rhythm. No extremity edema.   Abdomen: no tenderness, no masses palpated.  Bowel sounds positive.  Musculoskeletal: no clubbing / cyanosis. No joint deformity upper and lower extremities. Good ROM, no contractures. Normal muscle tone.  Skin: no rashes, lesions, ulcers. No  induration Neurologic: CN 2-12 grossly intact.  Able to move all extremities Psychiatric: Normal judgment and insight. Alert and oriented x 3. Normal mood.   Data Reviewed:  EKG reveals sinus rhythm at 70 bpm with premature atrial complexes.  Reviewed labs, imaging, and pertinent records as noted above in HPI.  Assessment and Plan:  Acute on chronic respiratory failure with hypoxia Interstitial lung disease Healthcare associated pneumonia Acute.  Patient presents with complaints of progressively worsening shortness of breath with a productive cough and hemoptysis since yesterday evening.  At baseline patient on 5 L of oxygen, but O2 saturations noted to be as low as 82% on 5 L.  Patient currently on high flow nasal cannula oxygen at 8 L with O2 saturations maintained above 90%.  CT scan of the chest noted interstitial pneumonitis fibrosis with possible pneumonia. -Admit to progressive bed -Aspiration precautions with elevation has been -Continuous pulse oximetry with oxygen maintain O2 saturation greater than 90%. -Incentive spirometry and flutter -Check sputum cultures and MRSA screen -Check procalcitonin -Follow-up pneumocystis PCR, autoimmune, and hypersensitivity panel  -Continue empiric empiric antibiotics with vancomycin and cefepime -Steroids 125 mg IV twice daily as recommended by pulmonology -PT/OT to eval and treat -Appreciate pulmonology consultative services follow-up for any further recommendation  Paroxysmal atrial fibrillation on chronic coagulation Patient currently appears to be in sinus rhythm. -Continue Eliquis, Tikosyn, and diltiazem  Diastolic congestive heart failure Chronic.  Patient does not appear grossly fluid overloaded at this time.  BNP was noted to be mildly elevated at 124.6 was noted to be mildly elevated 100 and last EF noted to be 65% with grade 1 diastolic dysfunction.   -Check weights daily -Evaluate for need of IV diuresis  Essential  hypertension Blood pressures maintained. -Continue current medication regimen as noted above  Diabetes mellitus type 2, without long-term use of insulin Hemoglobin A1c was 6.6 on 12/01/22.  Patient not on any medications prior to arrival.   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 -Continue follow-up in the outpatient setting.     Obesity BMI 37.3 kg/m  OSA -Continue BiPAP nightly VTE prophylaxis: Eliquis Advance Care Planning:   Code Status: Full Code   Consults: Pulmonology   Severity of Illness: The appropriate patient  status for this patient is INPATIENT. Inpatient status is judged to be reasonable and necessary in order to provide the required intensity of service to ensure the patient's safety. The patient's presenting symptoms, physical exam findings, and initial radiographic and laboratory data in the context of their chronic comorbidities is felt to place them at high risk for further clinical deterioration. Furthermore, it is not anticipated that the patient will be medically stable for discharge from the hospital within 2 midnights of admission.   * I certify that at the point of admission it is my clinical judgment that the patient will require inpatient hospital care spanning beyond 2 midnights from the point of admission due to high intensity of service, high risk for further deterioration and high frequency of surveillance required.*  Author: Clydie Braun, MD 12/10/2022 2:59 PM  For on call review [email or web address].

## 2022-12-17 NOTE — Consult Note (Signed)
NAME:  [name], MRN:  [id number], DOB:  05/04/1951, LOS: 0 ADMISSION DATE:  12/08/2022, CONSULTATION DATE:  12/16/2022 REFERRING MD:  EDP, CHIEF COMPLAINT:  hypoxia   History of Present Illness:  72yo female with hx dCHF, Afib on eliquis, chronic respiratory failure in the setting COPD, pulmonary fibrosis on chronic 5L [place] recently hospitalized 4/4-4/18 with acute on chronic respiratory failure r/t CAP treated with 5 days rocephin/doxy and steroid taper. Returns to ER 4/23 with ongoing SOB and hypoxia in PCP office with sats 80's despite her home 5L O2.   States she "felt better" and was nearing baseline after d/c 4/18. Had a few days of feeling well, good energy, minimal SOB above baseline. Then 4/22 developed worsening cough, wheezing, SOB, malaise. Denies fever, chest pain, abd pain, n/v/d. Denies obvious episodes of choking or aspiration. Denies dysphagia.   Pertinent  Medical History   has a past medical history of Chronic diastolic CHF (congestive heart failure), Chronic respiratory failure, Coronary atherosclerosis, Emphysema of lung, Hypercholesterolemia, Hypertension, NSIP (nonspecific interstitial pneumonia), Osteoarthritis, Persistent atrial fibrillation, and Thoracic aortic ectasia.   Significant Hospital Events: Including procedures, antibiotic start and stop dates in addition to other pertinent events   4/23 CT chest>> Pulmonary fibrosis. Bronchiectasis. There are patchy ground-glass densities in the periphery of both lungs, more so in the lower lung fields suggesting possible superimposed interstitial pneumonia. There is interval decrease in patchy alveolar infiltrates in both lungs in comparison with the CT done on 11/28/2022. There is no pleural effusion or pneumothorax.   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 Coronary artery calcifications are seen.  Interim History / Subjective:  Seen in ER, HPI as above   Objective   Blood  pressure (!) 78/43, pulse 77, temperature 97.8 F (36.6 C), temperature source Oral, resp. rate (!) 23, height 5' (1.524 m), weight 86.6 kg, SpO2 92 %.        Intake/Output Summary (Last 24 hours) at 12/14/2022 1357 Last data filed at 12/04/2022 1320 Gross per 24 hour  Intake 100 ml  Output --  Net 100 ml   Filed Weights   12/04/2022 1011  Weight: 86.6 kg    Examination: General: chronically ill appearing female, NAD  HENT: mm moist, no JVD  Lungs: resps even, non labored, slight tachypnea, coarse, diffuse exp wheeze  Cardiovascular: s1s2 rrr Abdomen: soft, non tender, +bs  Extremities: warm and dry, no BLE edema  Neuro: awake, alert, appropriate, MAE   Resolved Hospital Problem list     Assessment & Plan:  Acute on chronic respiratory failure  Pulmonary fibrosis with suspected progression of her ILD COPD with exacerbation  Suspected HCAP  PLAN -  Ok for [facility] SDU admit  Continue IV abx for ?HCAP  Sputum culture IV Steroids - 125mg  IV q12h for now  Consider swallow eval r/o aspiration  Autoimmune labs  Pulmonary hygiene  PT/OT Urine strep, legionella     Pulmonary will continue to follow  Best Practice (right click and "Reselect all SmartList Selections" daily)  Per [facility]  Labs   CBC: Recent Labs  Lab 12/12/22 0418 12/21/2022 1020 12/18/2022 1037  WBC 16.5* 18.6*  --   NEUTROABS 12.5* 14.8*  --   HGB 13.9 13.0 13.9  HCT 41.7 39.5 41.0  MCV 89.1 91.2  --   PLT 200 156  --     Basic Metabolic Panel: Recent Labs  Lab 12/11/22 0157 12/12/22 0418 12/09/2022 1020 12/21/2022 1037  NA 138 138  138 133*  K 5.0 4.5 4.0 7.3*  CL 97* 95* 101  --   CO2 33* 33* 26  --   GLUCOSE 82 94 110*  --   BUN 36* 39* 25*  --   CREATININE 1.30* 1.38* 1.00  --   CALCIUM 9.4 9.1 9.4  --    GFR: Estimated Creatinine Clearance: 49.7 mL/min (by C-G formula based on SCr of 1 mg/dL). Recent Labs  Lab 12/12/22 0418 12/20/2022 1020 11/30/2022 1210  WBC 16.5* 18.6*  --   LATICACIDVEN   --  2.3* 2.0*    Liver Function Tests: No results for input(s): "AST", "ALT", "ALKPHOS", "BILITOT", "PROT", "ALBUMIN" in the last 168 hours. No results for input(s): "LIPASE", "AMYLASE" in the last 168 hours. No results for input(s): "AMMONIA" in the last 168 hours.  ABG    Component Value Date/Time   HCO3 29.9 (H) 12/16/2022 1037   TCO2 31 12/01/2022 1037   O2SAT 59 12/16/2022 1037     Coagulation Profile: No results for input(s): "INR", "PROTIME" in the last 168 hours.  Cardiac Enzymes: No results for input(s): "CKTOTAL", "CKMB", "CKMBINDEX", "TROPONINI" in the last 168 hours.  HbA1C: Hgb A1c MFr Bld  Date/Time Value Ref Range Status  12/01/2022 08:24 AM 6.6 (H) 4.8 - 5.6 % Final    Comment:    (NOTE) Pre diabetes:          5.7%-6.4%  Diabetes:              >6.4%  Glycemic control for   <7.0% adults with diabetes   02/06/2016 04:47 AM 6.2 (H) 4.8 - 5.6 % Final    Comment:    (NOTE)         Pre-diabetes: 5.7 - 6.4         Diabetes: >6.4         Glycemic control for adults with diabetes: <7.0     CBG: Recent Labs  Lab 12/11/22 1125 12/11/22 1524 12/11/22 2145 12/12/22 0647 12/12/22 1148  GLUCAP 111* 213* 165* 78 145*    Review of Systems:   As per HPI - All other systems reviewed and were neg.   Past Medical History:  She,  has a past medical history of Chronic diastolic CHF (congestive heart failure), Chronic respiratory failure, Coronary atherosclerosis, Emphysema of lung, Hypercholesterolemia, Hypertension, NSIP (nonspecific interstitial pneumonia), Osteoarthritis, Persistent atrial fibrillation, and Thoracic aortic ectasia.   Surgical History:   Past Surgical History:  Procedure Laterality Date   BIOPSY  12/10/2021   Procedure: BIOPSY;  Surgeon: Meridee Score Netty Starring., MD;  Location: [facility] ENDOSCOPY;  Service: Gastroenterology;;   CARDIOVERSION N/A 07/25/2016   Procedure: CARDIOVERSION;  Surgeon: Pricilla Riffle, MD;  Location: [facility] ENDOSCOPY;   Service: Cardiovascular;  Laterality: N/A;   CARDIOVERSION N/A 09/14/2016   Procedure: CARDIOVERSION;  Surgeon: Hillis Range, MD;  Location: [facility] OR;  Service: Cardiovascular;  Laterality: N/A;   CATARACT EXTRACTION Bilateral    ESOPHAGOGASTRODUODENOSCOPY (EGD) WITH PROPOFOL N/A 12/10/2021   Procedure: ESOPHAGOGASTRODUODENOSCOPY (EGD) WITH PROPOFOL;  Surgeon: Lemar Lofty., MD;  Location: [facility] ENDOSCOPY;  Service: Gastroenterology;  Laterality: N/A;   EUS N/A 12/10/2021   Procedure: UPPER ENDOSCOPIC ULTRASOUND (EUS) RADIAL;  Surgeon: Lemar Lofty., MD;  Location: [facility] ENDOSCOPY;  Service: Gastroenterology;  Laterality: N/A;   FINE NEEDLE ASPIRATION  12/10/2021   Procedure: FINE NEEDLE ASPIRATION;  Surgeon: Meridee Score Netty Starring., MD;  Location: [facility] ENDOSCOPY;  Service: Gastroenterology;;   LEG SURGERY Right    TEE WITHOUT  CARDIOVERSION N/A 07/25/2016   Procedure: TRANSESOPHAGEAL ECHOCARDIOGRAM (TEE);  Surgeon: Pricilla Riffle, MD;  Location: [facility] ENDOSCOPY;  Service: Cardiovascular;  Laterality: N/A;     Social History:   reports that she quit smoking about 12 years ago. Her smoking use included cigarettes. She started smoking about 56 years ago. She has a 43.00 pack-year smoking history. She has never used smokeless tobacco. She reports that she does not drink alcohol and does not use drugs.   Family History:  Her family history includes Heart attack in her sister; Heart disease in her father. There is no history of Stroke.   Allergies Allergies  Allergen Reactions   Oxycodone Hcl Itching   Levaquin [Levofloxacin In D5w] Itching and Rash   Percocet [Oxycodone-Acetaminophen] Itching     Home Medications  Prior to Admission medications   Medication Sig Start Date End Date Taking? Authorizing Provider  ACCU-CHEK GUIDE test strip See admin instructions. 07/31/20   [provider]  acetaminophen (TYLENOL) 500 MG tablet Take 500 mg by mouth every 6 (six) hours as needed for  moderate pain.    [provider]  apixaban (ELIQUIS) 5 MG TABS tablet Take 1 tablet (5 mg total) by mouth 2 (two) times daily. 09/27/22   Newman Nip, NP  ascorbic acid (VITAMIN C) 500 MG tablet Take 500 mg by mouth every other day.    [provider]  atorvastatin (LIPITOR) 40 MG tablet Take 1 tablet (40 mg total) by mouth daily. 07/30/22   Sharlene Dory, PA-C  brimonidine (ALPHAGAN) 0.2 % ophthalmic solution Place 1 drop into both eyes 2 (two) times daily. 05/11/20   [provider]  calcium citrate-vitamin D (CITRACAL+D) 315-200 MG-UNIT tablet Take 1 tablet by mouth daily.    [provider]  cholecalciferol (VITAMIN D3) 25 MCG (1000 UNIT) tablet Take 1,000 Units by mouth every other day.    [provider]  clotrimazole-betamethasone (LOTRISONE) cream Apply 1 Application topically 2 (two) times daily. Patient not taking: Reported on 11/29/2022 12/17/21   [provider]  diltiazem (CARDIZEM CD) 240 MG 24 hr capsule Take 1 capsule (240 mg total) by mouth daily. 05/09/22   Corky Crafts, MD  dofetilide (TIKOSYN) 500 MCG capsule TAKE ONE CAPSULE BY MOUTH TWICE DAILY 08/05/22   Newman Nip, NP  [facility] BILOBA COMPLEX PO Take 1 tablet by mouth every other day.    [provider]  HYDROcodone-acetaminophen (NORCO/VICODIN) 5-325 MG tablet Take 1 tablet by mouth every 6 (six) hours as needed for moderate pain.    [provider]  LORazepam (ATIVAN) 1 MG tablet Take 1 mg by mouth daily as needed for anxiety. 01/05/16   [provider]  Multiple Vitamin (MULTIVITAMIN) capsule Take 1 capsule by mouth daily.    [provider]  Multiple Vitamins-Minerals (HAIR SKIN NAILS PO) Take 1 tablet by mouth every other day.    [provider]  OXYGEN 2lpm 24/7 and 4 lpm with exertion AHC    [provider]  Oxymetazoline HCl (SINEX ULTRA FINE MIST 12-HOUR NA) Place 1 spray into the nose daily as  needed (congestion).    [provider]  predniSONE (DELTASONE) 5 MG tablet 20 mg daily for 1 week, followed by 15 mg daily for 1 week followed by 10 mg daily for 1 week followed by 5 mg daily for 1 week then stop 12/13/22   Dahal, Melina Schools, MD  ROCKLATAN 0.02-0.005 % SOLN Place 1 drop into both  eyes at bedtime. 01/15/21   [provider]  UNABLE TO FIND BIPAP with 2lpm o2 at night  [facility]    [provider]    Dirk Dress, NP Pulmonary/Critical Care Medicine  12/01/2022  1:57 PM

## 2022-12-17 NOTE — ED Triage Notes (Signed)
Pt present to ED from doctor's office with c/o shortness of breath and low oxygen saturation reading. Pt denies chest pain at this time, but feels like her heart is in a-fib. Pt A&Ox4 at this time.

## 2022-12-18 ENCOUNTER — Inpatient Hospital Stay (HOSPITAL_COMMUNITY): Payer: Medicare Other

## 2022-12-18 DIAGNOSIS — J9621 Acute and chronic respiratory failure with hypoxia: Secondary | ICD-10-CM | POA: Diagnosis not present

## 2022-12-18 DIAGNOSIS — J8410 Pulmonary fibrosis, unspecified: Secondary | ICD-10-CM | POA: Diagnosis not present

## 2022-12-18 DIAGNOSIS — I480 Paroxysmal atrial fibrillation: Secondary | ICD-10-CM | POA: Diagnosis not present

## 2022-12-18 DIAGNOSIS — J189 Pneumonia, unspecified organism: Secondary | ICD-10-CM | POA: Diagnosis not present

## 2022-12-18 LAB — GLUCOSE, CAPILLARY
Glucose-Capillary: 130 mg/dL — ABNORMAL HIGH (ref 70–99)
Glucose-Capillary: 153 mg/dL — ABNORMAL HIGH (ref 70–99)
Glucose-Capillary: 142 mg/dL — ABNORMAL HIGH (ref 70–99)
Glucose-Capillary: 178 mg/dL — ABNORMAL HIGH (ref 70–99)

## 2022-12-18 LAB — BASIC METABOLIC PANEL
Anion gap: 14 (ref 5–15)
Anion gap: 6 (ref 5–15)
BUN: 27 mg/dL — ABNORMAL HIGH (ref 8–23)
BUN: 30 mg/dL — ABNORMAL HIGH (ref 8–23)
CO2: 23 mmol/L (ref 22–32)
CO2: 25 mmol/L (ref 22–32)
Calcium: 8.9 mg/dL (ref 8.9–10.3)
Calcium: 9.4 mg/dL (ref 8.9–10.3)
Chloride: 103 mmol/L (ref 98–111)
Chloride: 99 mmol/L (ref 98–111)
Creatinine, Ser: 1.1 mg/dL — ABNORMAL HIGH (ref 0.44–1.00)
Creatinine, Ser: 1.21 mg/dL — ABNORMAL HIGH (ref 0.44–1.00)
GFR, Estimated: 48 mL/min — ABNORMAL LOW (ref >60)
GFR, Estimated: 53 mL/min — ABNORMAL LOW (ref >60)
Glucose, Bld: 166 mg/dL — ABNORMAL HIGH (ref 70–99)
Glucose, Bld: 171 mg/dL — ABNORMAL HIGH (ref 70–99)
Potassium: 5.3 mmol/L — ABNORMAL HIGH (ref 3.5–5.1)
Potassium: 6.1 mmol/L — ABNORMAL HIGH (ref 3.5–5.1)
Sodium: 134 mmol/L — ABNORMAL LOW (ref 135–145)
Sodium: 136 mmol/L (ref 135–145)

## 2022-12-18 LAB — CBC
HCT: 32.8 % — ABNORMAL LOW (ref 36.0–46.0)
Hemoglobin: 10.9 g/dL — ABNORMAL LOW (ref 12.0–15.0)
MCH: 29.9 pg (ref 26.0–34.0)
MCHC: 33.2 g/dL (ref 30.0–36.0)
MCV: 89.9 fL (ref 80.0–100.0)
Platelets: 132 10*3/uL — ABNORMAL LOW (ref 150–400)
RBC: 3.65 MIL/uL — ABNORMAL LOW (ref 3.87–5.11)
RDW: 15.8 % — ABNORMAL HIGH (ref 11.5–15.5)
WBC: 10.5 10*3/uL (ref 4.0–10.5)
nRBC: 0 % (ref 0.0–0.2)

## 2022-12-18 LAB — BRAIN NATRIURETIC PEPTIDE: B Natriuretic Peptide: 122.3 pg/mL — ABNORMAL HIGH (ref 0.0–100.0)

## 2022-12-18 LAB — STREP PNEUMONIAE URINARY ANTIGEN: Strep Pneumo Urinary Antigen: NEGATIVE

## 2022-12-18 LAB — MAGNESIUM: Magnesium: 2.3 mg/dL (ref 1.7–2.4)

## 2022-12-18 MED ORDER — IPRATROPIUM BROMIDE 0.02 % IN SOLN
0.5000 mg | Freq: Four times a day (QID) | RESPIRATORY_TRACT | Status: DC
  Administered 2022-12-18: 0.5 mg via RESPIRATORY_TRACT
  Filled 2022-12-18: qty 2.5

## 2022-12-18 MED ORDER — GUAIFENESIN ER 600 MG PO TB12
1200.0000 mg | ORAL_TABLET | Freq: Two times a day (BID) | ORAL | Status: DC
  Administered 2022-12-18 – 2022-12-23 (×12): 1200 mg via ORAL
  Filled 2022-12-18 (×12): qty 2

## 2022-12-18 MED ORDER — ORAL CARE MOUTH RINSE
15.0000 mL | OROMUCOSAL | Status: DC
  Administered 2022-12-18 – 2022-12-24 (×19): 15 mL via OROMUCOSAL

## 2022-12-18 MED ORDER — SODIUM CHLORIDE 0.9 % IV SOLN
500.0000 mg | INTRAVENOUS | Status: DC
  Filled 2022-12-18: qty 5

## 2022-12-18 MED ORDER — LEVALBUTEROL HCL 0.63 MG/3ML IN NEBU
0.6300 mg | INHALATION_SOLUTION | Freq: Four times a day (QID) | RESPIRATORY_TRACT | Status: DC
  Administered 2022-12-18: 0.63 mg via RESPIRATORY_TRACT
  Filled 2022-12-18: qty 3

## 2022-12-18 MED ORDER — ORAL CARE MOUTH RINSE
15.0000 mL | OROMUCOSAL | Status: DC
  Administered 2022-12-18: 15 mL via OROMUCOSAL

## 2022-12-18 MED ORDER — SODIUM CHLORIDE 0.9 % IV SOLN
100.0000 mg | Freq: Two times a day (BID) | INTRAVENOUS | Status: AC
  Administered 2022-12-18 – 2022-12-22 (×10): 100 mg via INTRAVENOUS
  Filled 2022-12-18 (×10): qty 100

## 2022-12-18 MED ORDER — ORAL CARE MOUTH RINSE: 15.0000 mL | OROMUCOSAL | Status: DC | PRN

## 2022-12-18 MED ORDER — REVEFENACIN 175 MCG/3ML IN SOLN: 175.0000 ug | Freq: Every day | RESPIRATORY_TRACT | Status: DC

## 2022-12-18 MED ORDER — REVEFENACIN 175 MCG/3ML IN SOLN
175.0000 ug | Freq: Every day | RESPIRATORY_TRACT | Status: DC
  Administered 2022-12-19 – 2022-12-24 (×6): 175 ug via RESPIRATORY_TRACT
  Filled 2022-12-18 (×6): qty 3

## 2022-12-18 MED ORDER — SODIUM ZIRCONIUM CYCLOSILICATE 10 G PO PACK
10.0000 g | PACK | Freq: Two times a day (BID) | ORAL | Status: AC
  Administered 2022-12-18 (×2): 10 g via ORAL
  Filled 2022-12-18 (×2): qty 1

## 2022-12-18 MED ORDER — SODIUM ZIRCONIUM CYCLOSILICATE 10 G PO PACK: 10.0000 g | PACK | ORAL | Status: DC

## 2022-12-18 MED ORDER — FUROSEMIDE 10 MG/ML IJ SOLN
40.0000 mg | Freq: Once | INTRAMUSCULAR | Status: AC
  Administered 2022-12-18: 40 mg via INTRAVENOUS
  Filled 2022-12-18: qty 4

## 2022-12-18 MED ORDER — ARFORMOTEROL TARTRATE 15 MCG/2ML IN NEBU: 15.0000 ug | INHALATION_SOLUTION | Freq: Two times a day (BID) | RESPIRATORY_TRACT | Status: DC

## 2022-12-18 MED ORDER — ARFORMOTEROL TARTRATE 15 MCG/2ML IN NEBU
15.0000 ug | INHALATION_SOLUTION | Freq: Two times a day (BID) | RESPIRATORY_TRACT | Status: DC
  Administered 2022-12-18 – 2022-12-24 (×12): 15 ug via RESPIRATORY_TRACT
  Filled 2022-12-18 (×11): qty 2

## 2022-12-18 NOTE — Progress Notes (Signed)
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  Patient improved clinically on discharge however with worsening shortness of breath and worsening respiratory status after steroid taper down presented back to the ED with worsening cough, bloodstained sputum, wheezing and dyspnea.  CT chest done without contrast with worsening interstitial changes noted with concerns for superimposed interstitial pneumonia.  Patient admitted placed on IV antibiotics, IV steroids, bronchodilators, PCCM consulted.   Assessment & Plan:   Principal Problem:   Pulmonary fibrosis Active Problems:   Healthcare-associated pneumonia   Acute on chronic respiratory failure with hypoxia   Anticoagulated   Paroxysmal atrial fibrillation   Chronic diastolic heart failure   Essential hypertension   Type 2 diabetes mellitus with other specified complication   Chronic pain syndrome   Anxiety disorder   Hypercholesterolemia   Thoracic aortic ectasia   OSA treated with BiPAP   Obesity (BMI 30-39.9)  #1 acute on chronic respiratory failure with hypoxia/?  Progressive interstitial lung disease/healthcare associated pneumonia -Patient present with complaints of progressively worsening shortness of breath, productive cough, hemoptysis that started 1 day prior to admission after being tapered off steroids during recent hospitalization. -Baseline O2 requirements of 5 L nasal cannula but O2 sats noted to  be as low as 82% on 5 L prior to admission. -CT chest done on admission concerning for progressive worsening interstitial lung disease in the setting of superimposed pneumonia. -Patient with no significant improvement with shortness of breath since admission, increasing O2 requirements currently on heated high flow nasal cannula with FiO2 of 90% with O2 flow rate of 35 with sats of 87%. -Sputum cultures pending. -MRSA PCR negative. -Pneumocystis PCR, autoimmune and hypersensitivity panel pending. -Was on IV vancomycin which has been discontinued and patient placed on IV doxycycline. -Continue IV cefepime, IV steroids. -Increase Mucinex to 1200 mg twice daily. -Xopenex and Atrovent nebs ordered this morning however discontinued per PCCM and patient started on Brovana and yelperi. -Lasix 40 mg IV x 1 ordered per pulmonary. -PCCM recommending prolonged steroid taper with outpatient follow-up for consideration of CellCept therapy. -Low threshold for transfer to ICU if worsening respiratory status. -PCCM following and appreciate input and recommendations.  2.  Paroxysmal atrial fibrillation on chronic anticoagulation therapy -Continue Tikosyn, diltiazem for rate control. -Eliquis for anticoagulation.  3.  Chronic diastolic CHF -Patient currently euvolemic on examination does not look volume overloaded. -BNP on admission noted at 124.6. -Recent 2D echo from 12/01/2022 with EF of 60 to 65%, no wall motion abnormalities, grade 1 diastolic dysfunction. -Patient to receive Lasix 40 mg IV x 1 per pulmonary today. -Follow-up.  4.  Hypertension -BP stable.  5.  Diabetes mellitus type 2, without long-term use of insulin -Hemoglobin A1c noted at 6.6 on 12/01/2022. -Seems to be diet controlled as patient not on any diabetic medications prior to admission. -Patient on IV steroids and as such we will need CBGs monitored. -CBG 130 this morning. -SSI.  6.  Chronic pain -Continue home regimen  Norco.  7.  Anxiety -Ativan  as needed.  8.  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 -Outpatient follow-up.  9.  Obesity -BMI 37.3 kg/m -Lifestyle modification -Outpatient follow-up with PCP.  10.  OSA -BiPAP nightly.  11.  Hyperlipidemia -Continue statin.   DVT prophylaxis: Eliquis Code Status: Full Family Communication: Updated patient and sister at bedside. Disposition: Remain in progressive care.  If worsening respiratory status may need to be transferred to the ICU.  Status is: Inpatient Remains inpatient appropriate because: Severity of illness   Consultants:  PCCM: Dr. Francine Graven 12/20/2022   Procedures:  CT chest without contrast 12/14/2022 Chest x-ray 12/18/2022   Antimicrobials:  IV cefepime 11/30/2022>>>> IV vancomycin 12/16/2022>>>> 12/18/2022 IV doxycycline 12/18/2022>>>>   Subjective: Patient laying in bed.  States no significant improvement with shortness of breath since admission.  Still complains of significant shortness of breath.  Increased O2 requirements currently on heated high flow nasal cannula with an FiO2 of 90% with a O2 flow rate of 35 with sats of 87%.  States that bedside..  Objective: Vitals:   12/18/22 1156 12/18/22 1200 12/18/22 1354 12/18/22 1524  BP: 111/65   127/72  Pulse:  72 71 71  Resp:  12 (!) 21 (!) 22  Temp: 98.3 F (36.8 C)   98.1 F (36.7 C)  TempSrc: Oral   Oral  SpO2:  90% 90% 90%  Weight:      Height:        Intake/Output Summary (Last 24 hours) at 12/18/2022 1730 Last data filed at 12/18/2022 1600 Gross per 24 hour  Intake 1059.71 ml  Output 2700 ml  Net -1640.29 ml   Filed Weights   12/08/2022 1011 11/30/2022 1956  Weight: 86.6 kg 87.3 kg    Examination:  General exam: On heated high flow nasal cannula.   NAD. Respiratory system: Diffuse inspiratory crackles.  Some minimal wheezing.  Speaking in choppy sentences.   Cardiovascular system: RRR. No JVD, murmurs, rubs, gallops or clicks. No pedal edema. Gastrointestinal system: Abdomen is nondistended, soft and nontender. No organomegaly or masses felt. Normal bowel sounds heard. Central nervous system: Alert and oriented. No focal neurological deficits. Extremities: Symmetric 5 x 5 power. Skin: No rashes, lesions or ulcers Psychiatry: Judgement and insight appear normal. Mood & affect appropriate.     Data Reviewed: I have personally reviewed following labs and imaging studies  CBC: Recent Labs  Lab 12/12/22 0418 11/26/2022 1020 12/22/2022 1037 12/18/22 0002  WBC 16.5* 18.6*  --  10.5  NEUTROABS 12.5* 14.8*  --   --   HGB 13.9 13.0 13.9 10.9*  HCT 41.7 39.5 41.0 32.8*  MCV 89.1 91.2  --  89.9  PLT 200 156  --  132*    Basic Metabolic Panel: Recent Labs  Lab 12/12/22 0418 12/11/2022 1020 12/01/2022 1037 12/18/22 0002 12/18/22 0833  NA 138 138 133* 134* 136  K 4.5 4.0 7.3* 6.1* 5.3*  CL 95* 101  --  103 99  CO2 33* 26  --  25 23  GLUCOSE 94 110*  --  171* 166*  BUN 39* 25*  --  27* 30*  CREATININE 1.38* 1.00  --  1.10* 1.21*  CALCIUM 9.1 9.4  --  8.9 9.4  MG  --   --   --   --  2.3  GFR: Estimated Creatinine Clearance: 41.3 mL/min (A) (by C-G formula based on SCr of 1.21 mg/dL (H)).  Liver Function Tests: No results for input(s): "AST", "ALT", "ALKPHOS", "BILITOT", "PROT", "ALBUMIN" in the last 168 hours.  CBG: Recent Labs  Lab 12/13/2022 1722 12/04/2022 2117 12/18/22 0606 12/18/22 1125 12/18/22 1653  GLUCAP 126* 170* 130* 153* 142*     Recent Results (from the past 240 hour(s))  SARS Coronavirus 2 by RT PCR (hospital order, performed in [facility] hospital lab) *cepheid single result test* Anterior Nasal Swab     Status: None   Collection Time: 11/25/2022 10:03 AM   Specimen: Anterior Nasal Swab  Result Value Ref  Range Status   SARS Coronavirus 2 by RT PCR NEGATIVE NEGATIVE Final    Comment: Performed at [facility], [address], [place], [place] [address]  Expectorated Sputum Assessment w Gram Stain, Rflx to Resp Cult     Status: None   Collection Time: 12/16/2022  3:50 PM   Specimen: Expectorated Sputum  Result Value Ref Range Status   Specimen Description EXPECTORATED SPUTUM  Final   Special Requests NONE  Final   Sputum evaluation   Final    Sputum specimen not acceptable for testing.  Please recollect.   notified RN Rowe Pavy ON 12/11/2022 @ 2027 BY DRT Performed at [facility], [address], [place], [place] [address]    Report Status 12/16/2022 FINAL  Final  MRSA Next Gen by PCR, Nasal     Status: None   Collection Time: 12/13/2022  8:08 PM   Specimen: Nasal Mucosa; Nasal Swab  Result Value Ref Range Status   MRSA by PCR Next Gen NOT DETECTED NOT DETECTED Final    Comment: (NOTE) The GeneXpert MRSA Assay (FDA approved for NASAL specimens only), is one component of a comprehensive MRSA colonization surveillance program. It is not intended to diagnose MRSA infection nor to guide or monitor treatment for MRSA infections. Test performance is not FDA approved in patients less than 67 years old. Performed at [facility], [address], [place], [place] [address]          Radiology Studies: DG CHEST PORT 1 VIEW  Result Date: 12/18/2022 CLINICAL DATA:  Respiratory failure EXAM: PORTABLE CHEST 1 VIEW COMPARISON:  12/20/2022 CT FINDINGS: Cardiac shadow is stable. Lungs are well aerated bilaterally. Diffuse fibrotic changes are again noted similar to that seen on the prior exam. Some generalized increased density is noted in the right lung however when compared with the prior exam. Portion of this is related to patient rotation to the right. There is also a likely some central developing airspace opacity. Continued follow-up is recommended. IMPRESSION: Increasing  opacification in the right lung as described. Follow-up is recommended. Electronically Signed   By: Alcide Clever M.D.   On: 12/18/2022 13:07   CT Chest Wo Contrast  Result Date: 12/13/2022 CLINICAL DATA:  Pneumonia, worsening dyspnea, productive cough EXAM: CT CHEST WITHOUT CONTRAST TECHNIQUE: Multidetector CT imaging of the chest was performed following the standard protocol without IV contrast. RADIATION DOSE REDUCTION: This exam was performed according to the departmental dose-optimization program which includes automated exposure control, adjustment of the mA and/or kV according to patient size and/or use of iterative reconstruction technique. COMPARISON:  Previous CT done on 11/28/2022, previous chest radiographs including the study done on 12/09/2022 FINDINGS: Cardiovascular: There is aneurysmal dilation in proximal descending thoracic aorta measuring 5.4 x 5.2 cm. There is ectasia of  the inferior course of thoracic aorta measuring 4.6 cm in diameter. Ascending thoracic aorta measures 3.9 cm. There are scattered calcifications in thoracic aorta and its major branches. Coronary artery calcifications are seen. Mediastinum/Nodes: No new significant lymphadenopathy is seen in mediastinum. Lungs/Pleura: There is ectasia of bronchi. Increased interstitial markings are seen in the periphery of both lungs, more so in the lower lung fields. Patchy ground-glass densities are seen in the periphery of both lungs. There is interval decrease in patchy alveolar infiltrates in both lungs. There is no pleural effusion or pneumothorax. Upper Abdomen: Visualized upper abdomen is unremarkable. Musculoskeletal: No acute findings are seen. IMPRESSION: Pulmonary fibrosis. Bronchiectasis. There are patchy ground-glass densities in the periphery of both lungs, more so in the lower lung fields suggesting possible superimposed interstitial pneumonia. There is interval decrease in patchy alveolar infiltrates in both lungs in  comparison with the CT done on 11/28/2022. There is no pleural effusion or pneumothorax. 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 Coronary artery calcifications are seen. Electronically Signed   By: Ernie Avena M.D.   On: 11/30/2022 11:47        Scheduled Meds:  apixaban  5 mg Oral BID   arformoterol  15 mcg Nebulization BID   atorvastatin  40 mg Oral Daily   brimonidine  1 drop Both Eyes BID   Chlorhexidine Gluconate Cloth  6 each Topical Q0600   diltiazem  240 mg Oral Daily   dofetilide  500 mcg Oral BID   guaiFENesin  1,200 mg Oral BID   insulin aspart  0-5 Units Subcutaneous QHS   insulin aspart  0-9 Units Subcutaneous TID WC   methylPREDNISolone (SOLU-MEDROL) injection  125 mg Intravenous Q12H   mouth rinse  15 mL Mouth Rinse 4 times per day   [START ON 12/19/2022] revefenacin  175 mcg Nebulization Daily   sodium chloride flush  3 mL Intravenous Q12H   Continuous Infusions:  ceFEPime (MAXIPIME) IV Stopped (12/18/22 1156)   doxycycline (VIBRAMYCIN) IV 125 mL/hr at 12/18/22 1600     LOS: 1 day    Time spent: 40 minutes    Ramiro Harvest, MD [facility]   To contact the attending provider between 7A-7P or the covering provider during after hours 7P-7A, please log into the web site [email or web address] and access using universal [place] password for that web site. If you do not have the password, please call the hospital operator.  12/18/2022, 5:30 PM

## 2022-12-18 NOTE — Progress Notes (Addendum)
NAME:  [name], MRN:  [id number], DOB:  1951-07-02, LOS: 1 ADMISSION DATE:  12/17/2022, CONSULTATION DATE:  12/17/22 REFERRING MD:  EDP, CHIEF COMPLAINT:  hypoxia   History of Present Illness:  72yo female with hx dCHF, Afib on eliquis, chronic respiratory failure in the setting COPD, pulmonary fibrosis on chronic 5L [place] recently hospitalized 4/4-4/18 with acute on chronic respiratory failure r/t CAP treated with 5 days rocephin/doxy and steroid taper. Returns to ER 4/23 with ongoing SOB and hypoxia in PCP office with sats 80's despite her home 5L O2.   States she "felt better" and was nearing baseline after d/c 4/18. Had a few days of feeling well, good energy, minimal SOB above baseline. Then 4/22 developed worsening cough, wheezing, SOB, malaise. Denies fever, chest pain, abd pain, n/v/d. Denies obvious episodes of choking or aspiration. Denies dysphagia.   Pertinent  Medical History   has a past medical history of Chronic diastolic CHF (congestive heart failure), Chronic respiratory failure, Coronary atherosclerosis, Emphysema of lung, Hypercholesterolemia, Hypertension, NSIP (nonspecific interstitial pneumonia), Osteoarthritis, Persistent atrial fibrillation, and Thoracic aortic ectasia.   Significant Hospital Events: Including procedures, antibiotic start and stop dates in addition to other pertinent events   4/23 CT chest>> Pulmonary fibrosis. Bronchiectasis. There are patchy ground-glass densities in the periphery of both lungs, more so in the lower lung fields suggesting possible superimposed interstitial pneumonia. There is interval decrease in patchy alveolar infiltrates in both lungs in comparison with the CT done on 11/28/2022. There is no pleural effusion or pneumothorax.   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 Coronary artery calcifications are seen.  Interim History / Subjective:  Currently being treated for hospital acquired  pneumonia.  Sitting on the side of the bed no acute distress  Objective   Blood pressure (!) 141/69, pulse 74, temperature 97.7 F (36.5 C), temperature source Oral, resp. rate 18, height 5' (1.524 m), weight 87.3 kg, SpO2 92 %.    FiO2 (%):  [90 %] 90 %   Intake/Output Summary (Last 24 hours) at 12/18/2022 0944 Last data filed at 12/18/2022 0800 Gross per 24 hour  Intake 842.9 ml  Output 600 ml  Net 242.9 ml   Filed Weights   12/17/22 1011 12/17/22 1956  Weight: 86.6 kg 87.3 kg    Examination: Awake alert currently on high flow oxygen 45% with O2 saturations of 91% No JVD or lymphadenopathy appreciated Mild rhonchi bilaterally with mild vocal cord dysfunction noted, also noted to have kyphosis Heart sounds regular rate and rhythm current blood pressure 141/69 with a heart rate of 69 Abdomen obese soft positive bowel sounds Extremities with edema  Resolved Hospital Problem list     Assessment & Plan:  Acute on chronic respiratory failure  Pulmonary fibrosis with suspected progression of her ILD COPD with exacerbation  Suspected HCAP  History of atrial fibrillation on Eliquis.  PLAN -  Admitted to progressive care per [facility] Continue IV antibiotics for suspected pneumonia Continue steroids and consider weaning in the near future Sliding scale insulin for hyperglycemia from steroids Follow-up autoimmune labs Agree with swallow evaluation rule out aspiration Continue to monitor microbial data Consider gentle diuresis, creatinine noted to be 1.21 with BUN of 30   Pulmonary will continue to follow  Best Practice (right click and "Reselect all SmartList Selections" daily)  Per [facility]  Labs   CBC: Recent Labs  Lab 12/12/22 0418 12/17/22 1020 12/17/22 1037 12/18/22 0002  WBC 16.5* 18.6*  --  10.5  NEUTROABS 12.5* 14.8*  --   --   HGB 13.9 13.0 13.9 10.9*  HCT 41.7 39.5 41.0 32.8*  MCV 89.1 91.2  --  89.9  PLT 200 156  --  132*    Basic  Metabolic Panel: Recent Labs  Lab 12/12/22 0418 12/17/22 1020 12/17/22 1037 12/18/22 0002 12/18/22 0833  NA 138 138 133* 134* 136  K 4.5 4.0 7.3* 6.1* 5.3*  CL 95* 101  --  103 99  CO2 33* 26  --  25 23  GLUCOSE 94 110*  --  171* 166*  BUN 39* 25*  --  27* 30*  CREATININE 1.38* 1.00  --  1.10* 1.21*  CALCIUM 9.1 9.4  --  8.9 9.4  MG  --   --   --   --  2.3   GFR: Estimated Creatinine Clearance: 41.3 mL/min (A) (by C-G formula based on SCr of 1.21 mg/dL (H)). Recent Labs  Lab 12/12/22 0418 12/17/22 1020 12/17/22 1210 12/17/22 1430 12/18/22 0002  PROCALCITON  --   --   --  <0.10  --   WBC 16.5* 18.6*  --   --  10.5  LATICACIDVEN  --  2.3* 2.0*  --   --     Liver Function Tests: No results for input(s): "AST", "ALT", "ALKPHOS", "BILITOT", "PROT", "ALBUMIN" in the last 168 hours. No results for input(s): "LIPASE", "AMYLASE" in the last 168 hours. No results for input(s): "AMMONIA" in the last 168 hours.  ABG    Component Value Date/Time   HCO3 29.9 (H) 12/17/2022 1037   TCO2 31 12/17/2022 1037   O2SAT 59 12/17/2022 1037     Coagulation Profile: No results for input(s): "INR", "PROTIME" in the last 168 hours.  Cardiac Enzymes: Recent Labs  Lab 12/17/22 1430  CKTOTAL 33*    HbA1C: Hgb A1c MFr Bld  Date/Time Value Ref Range Status  12/01/2022 08:24 AM 6.6 (H) 4.8 - 5.6 % Final    Comment:    (NOTE) Pre diabetes:          5.7%-6.4%  Diabetes:              >6.4%  Glycemic control for   <7.0% adults with diabetes   02/06/2016 04:47 AM 6.2 (H) 4.8 - 5.6 % Final    Comment:    (NOTE)         Pre-diabetes: 5.7 - 6.4         Diabetes: >6.4         Glycemic control for adults with diabetes: <7.0     CBG: Recent Labs  Lab 12/12/22 0647 12/12/22 1148 12/17/22 1722 12/17/22 2117 12/18/22 0606  GLUCAP 78 145* 126* 170* 130*    Steve Aerial Dilley ACNP Acute Care Nurse Practitioner [facility]/Critical Care Please consult Amion 12/18/2022, 9:45  AM

## 2022-12-18 NOTE — Progress Notes (Signed)
PT Cancellation Note  Patient Details Name: [name] MRN: [id number] DOB: 12-21-1950   Cancelled Treatment:    Reason Eval/Treat Not Completed: (P) Medical issues which prohibited therapy Respiratory therapy just switched pt to [facility] and pt sitting up on EoB. Pt reports she is finally feeling better. Reports she likes her new Rollator and nothing at home has changed since her admission 2 days ago. PT will follow back for formal evaluation later this afternoon as able.  Timica Marcom B. Beverely Risen PT, DPT Acute Rehabilitation Services Please use secure chat or  Call Office [phone]    [name] 12/18/2022, 8:30 AM

## 2022-12-18 NOTE — Progress Notes (Signed)
OT Cancellation Note  Patient Details Name: [name] MRN: [id number] DOB: Dec 26, 1950   Cancelled Treatment:    Reason Eval/Treat Not Completed: Patient not medically ready. RRT placed patient on HHFNC 15L d/t increased 02 demands. OT will continue to f/u as able  12/18/2022  AB, OTR/L  Acute Rehabilitation Services  Office: [phone]  [name] 12/18/2022, 8:38 AM

## 2022-12-18 NOTE — Evaluation (Signed)
Clinical/Bedside Swallow Evaluation Patient Details  Name: [name] MRN: [id number] Date of Birth: January 31, 1951  Today's Date: 12/18/2022 Time: SLP Start Time (ACUTE ONLY): 1325 SLP Stop Time (ACUTE ONLY): 1340 SLP Time Calculation (min) (ACUTE ONLY): 15 min  Past Medical History:  Past Medical History:  Diagnosis Date   Chronic diastolic CHF (congestive heart failure)    Chronic respiratory failure    Coronary atherosclerosis    a. 2V by CT 10/2017.   Emphysema of lung    Hypercholesterolemia    Hypertension    NSIP (nonspecific interstitial pneumonia)    Osteoarthritis    Persistent atrial fibrillation    Thoracic aortic ectasia    Past Surgical History:  Past Surgical History:  Procedure Laterality Date   BIOPSY  12/10/2021   Procedure: BIOPSY;  Surgeon: Meridee Score Netty Starring., MD;  Location: [facility] END[facility]COPY;  Service: Gastroenterology;;   CARDIOVERSION N/A 07/25/2016   Procedure: CARDIOVERSION;  Surgeon: Pricilla Riffle, MD;  Location: [facility] ENDOSCOPY;  Service: Cardiovascular;  Laterality: N/A;   CARDIOVERSION N/A 09/14/2016   Procedure: CARDIOVERSION;  Surgeon: Hillis Range, MD;  Location: [facility] OR;  Service: Cardiovascular;  Laterality: N/A;   CATARACT EXTRACTION Bilateral    ESOPHAGOGASTRODUODENOSCOPY (EGD) WITH PROPOFOL N/A 12/10/2021   Procedure: ESOPHAGOGASTRODUODENOSCOPY (EGD) WITH PROPOFOL;  Surgeon: Lemar Lofty., MD;  Location: [facility] ENDOSCOPY;  Service: Gastroenterology;  Laterality: N/A;   EUS N/A 12/10/2021   Procedure: UPPER ENDOSCOPIC ULTRASOUND (EUS) RADIAL;  Surgeon: Lemar Lofty., MD;  Location: [facility] ENDOSCOPY;  Service: Gastroenterology;  Laterality: N/A;   FINE NEEDLE ASPIRATION  12/10/2021   Procedure: FINE NEEDLE ASPIRATION;  Surgeon: Meridee Score Netty Starring., MD;  Location: [facility] ENDOSCOPY;  Service: Gastroenterology;;   LEG SURGERY Right    TEE WITHOUT CARDIOVERSION N/A 07/25/2016   Procedure: TRANSESOPHAGEAL ECHOCARDIOGRAM (TEE);  Surgeon: Pricilla Riffle, MD;  Location: [facility] ENDOSCOPY;  Service: Cardiovascular;  Laterality: N/A;   HPI:  [name] with HFpEF, atrial fibrillation on eliquis, chronic respiratory failure with COPD and interstitial lung disease on 5L [place] who was hospitalized 4/4 to 4/18 with acute on chronic respiratory failure treated with antibiotics and steroid taper. She was feeling better but her dyspnea returned as the steroids tapered down. She returns today with worsening cough, blood tinged sputum, wheezing and dyzpnea. She has progressive interstitial changes on serial CT Chest scans 12/17/22, 11/28/22, 07/16/22, 01/15/22 reviewed. Most recent 4/24: Increasing opacification in the right lung.    Assessment / Plan / Recommendation  Clinical Impression  Patient presents with what appears to be normal oropharyngeal swallowing functional based on bedside exam. Oral phase timely with complete mastication and clearance of bolus. No overt s/s of aspiration observed across consistencies and patient does not endorse a h/o dysphagia. At this time, no SLP f/u indicated. Do note CXR with increasing right sided opacities which do raise question of silent aspiration but clinicially at bedside, patient appears to be protecting airway and again, endorses no difficulty. MD, if feel needed in the future, instrumental assessment could be done to r/o silent aspiration. At time, would continue current diet with general safe swallowing precautions. SLP Visit Diagnosis: Dysphagia, unspecified (R13.10)       Diet Recommendation Regular;Thin liquid   Liquid Administration via: Cup;Straw Medication Administration: Whole meds with liquid Supervision: Patient able to self feed Compensations: Slow rate;Small sips/bites (take breaks as needed to control SOB) Postural Changes: Seated upright at 90 degrees    Other  Recommendations Oral Care Recommendations: Oral care BID  Recommendations for follow up therapy are one component of a multi-disciplinary discharge  planning process, led by the attending physician.  Recommendations may be updated based on patient status, additional functional criteria and insurance authorization.  Follow up Recommendations No SLP follow up        Swallow Study   General HPI: [name] with HFpEF, atrial fibrillation on eliquis, chronic respiratory failure with COPD and interstitial lung disease on 5L [place] who was hospitalized 4/4 to 4/18 with acute on chronic respiratory failure treated with antibiotics and steroid taper. She was feeling better but her dyspnea returned as the steroids tapered down. She returns today with worsening cough, blood tinged sputum, wheezing and dyzpnea. She has progressive interstitial changes on serial CT Chest scans 12/17/22, 11/28/22, 07/16/22, 01/15/22 reviewed. Most recent 4/24: Increasing opacification in the right lung. Type of Study: Bedside Swallow Evaluation Previous Swallow Assessment: none Diet Prior to this Study: Regular;Thin liquids (Level 0) Temperature Spikes Noted: No Respiratory Status:  (HHFNC) History of Recent Intubation: No Behavior/Cognition: Alert;Cooperative;Pleasant mood Oral Cavity Assessment: Within Functional Limits Oral Care Completed by SLP: Recent completion by staff Oral Cavity - Dentition: Adequate natural dentition Vision: Functional for self-feeding Self-Feeding Abilities: Able to feed self Patient Positioning: Upright in bed Baseline Vocal Quality: Normal Volitional Cough: Strong Volitional Swallow: Able to elicit    Oral/Motor/Sensory Function Overall Oral Motor/Sensory Function: Within functional limits   Ice Chips Ice chips: Not tested   Thin Liquid Thin Liquid: Within functional limits Presentation: Self Fed;Straw    Nectar Thick Nectar Thick Liquid: Not tested   Honey Thick Honey Thick Liquid: Not tested   Puree Puree: Within functional limits Presentation: Self Fed;Spoon   Solid     Solid: Within functional limits Presentation: Self Fed     [name], CCC-SLP  [name] 12/18/2022,1:47 PM

## 2022-12-18 NOTE — Progress Notes (Signed)
PT Cancellation Note  Patient Details Name: [name] MRN: [id number] DOB: [birth year]   Cancelled Treatment:    Reason Eval/Treat Not Completed: (P) Medical issues which prohibited therapy. Re-attempted PT Eval again this afternoon, but RN reporting pt is only satting at 88% and her SpO2 levels are decreasing to 70s% with just rolling in bed while on 30L HHFNC, requesting a hold on PT at this time. Will plan to follow-up tomorrow.   Raymond Gurney, PT, DPT Acute Rehabilitation Services  Office: [phone]    [name] 12/18/2022, 3:42 PM

## 2022-12-19 ENCOUNTER — Inpatient Hospital Stay (HOSPITAL_COMMUNITY): Payer: Medicare Other

## 2022-12-19 DIAGNOSIS — I480 Paroxysmal atrial fibrillation: Secondary | ICD-10-CM | POA: Diagnosis not present

## 2022-12-19 DIAGNOSIS — J189 Pneumonia, unspecified organism: Secondary | ICD-10-CM | POA: Diagnosis not present

## 2022-12-19 DIAGNOSIS — J9621 Acute and chronic respiratory failure with hypoxia: Secondary | ICD-10-CM | POA: Diagnosis not present

## 2022-12-19 DIAGNOSIS — J8410 Pulmonary fibrosis, unspecified: Secondary | ICD-10-CM | POA: Diagnosis not present

## 2022-12-19 LAB — CBC WITH DIFFERENTIAL/PLATELET
Abs Immature Granulocytes: 0.1 10*3/uL — ABNORMAL HIGH (ref 0.00–0.07)
Basophils Absolute: 0 10*3/uL (ref 0.0–0.1)
Basophils Relative: 0 %
Eosinophils Absolute: 0 10*3/uL (ref 0.0–0.5)
Eosinophils Relative: 0 %
HCT: 31.5 % — ABNORMAL LOW (ref 36.0–46.0)
Hemoglobin: 10.7 g/dL — ABNORMAL LOW (ref 12.0–15.0)
Immature Granulocytes: 1 %
Lymphocytes Relative: 2 %
Lymphs Abs: 0.4 10*3/uL — ABNORMAL LOW (ref 0.7–4.0)
MCH: 30.2 pg (ref 26.0–34.0)
MCHC: 34 g/dL (ref 30.0–36.0)
MCV: 89 fL (ref 80.0–100.0)
Monocytes Absolute: 0.9 10*3/uL (ref 0.1–1.0)
Monocytes Relative: 5 %
Neutro Abs: 16.8 10*3/uL — ABNORMAL HIGH (ref 1.7–7.7)
Neutrophils Relative %: 92 %
Platelets: 126 10*3/uL — ABNORMAL LOW (ref 150–400)
RBC: 3.54 MIL/uL — ABNORMAL LOW (ref 3.87–5.11)
RDW: 15.9 % — ABNORMAL HIGH (ref 11.5–15.5)
WBC: 18.2 10*3/uL — ABNORMAL HIGH (ref 4.0–10.5)
nRBC: 0 % (ref 0.0–0.2)

## 2022-12-19 LAB — BASIC METABOLIC PANEL
Anion gap: 9 (ref 5–15)
BUN: 38 mg/dL — ABNORMAL HIGH (ref 8–23)
CO2: 25 mmol/L (ref 22–32)
Calcium: 9.1 mg/dL (ref 8.9–10.3)
Chloride: 103 mmol/L (ref 98–111)
Creatinine, Ser: 1.25 mg/dL — ABNORMAL HIGH (ref 0.44–1.00)
GFR, Estimated: 46 mL/min — ABNORMAL LOW (ref >60)
Glucose, Bld: 171 mg/dL — ABNORMAL HIGH (ref 70–99)
Potassium: 4.4 mmol/L (ref 3.5–5.1)
Sodium: 137 mmol/L (ref 135–145)

## 2022-12-19 LAB — ANCA PROFILE
Anti-MPO Antibodies: <0.2 units (ref 0.0–0.9)
Anti-PR3 Antibodies: <0.2 units (ref 0.0–0.9)
Atypical P-ANCA titer: <1:20 {titer}
C-ANCA: <1:20 {titer}
P-ANCA: <1:20 {titer}

## 2022-12-19 LAB — RHEUMATOID FACTOR: Rheumatoid fact SerPl-aCnc: 11.2 IU/mL (ref <14.0)

## 2022-12-19 LAB — GLUCOSE, CAPILLARY
Glucose-Capillary: 139 mg/dL — ABNORMAL HIGH (ref 70–99)
Glucose-Capillary: 139 mg/dL — ABNORMAL HIGH (ref 70–99)
Glucose-Capillary: 123 mg/dL — ABNORMAL HIGH (ref 70–99)
Glucose-Capillary: 145 mg/dL — ABNORMAL HIGH (ref 70–99)

## 2022-12-19 LAB — MAGNESIUM: Magnesium: 2.2 mg/dL (ref 1.7–2.4)

## 2022-12-19 LAB — C4 COMPLEMENT: Complement C4, Body Fluid: 18 mg/dL (ref 12–38)

## 2022-12-19 LAB — ANTI-SCLERODERMA ANTIBODY: Scleroderma (Scl-70) (ENA) Antibody, IgG: <0.2 AI (ref 0.0–0.9)

## 2022-12-19 LAB — C3 COMPLEMENT: C3 Complement: 101 mg/dL (ref 82–167)

## 2022-12-19 LAB — CYCLIC CITRUL PEPTIDE ANTIBODY, IGG/IGA: CCP Antibodies IgG/IgA: 5 units (ref 0–19)

## 2022-12-19 LAB — ALDOLASE: Aldolase: 10.4 U/L — ABNORMAL HIGH (ref 3.3–10.3)

## 2022-12-19 MED ORDER — CHLORHEXIDINE GLUCONATE CLOTH 2 % EX PADS
6.0000 | MEDICATED_PAD | Freq: Every day | CUTANEOUS | Status: DC
  Administered 2022-12-19 – 2022-12-21 (×3): 6 via TOPICAL

## 2022-12-19 MED ORDER — METHYLPREDNISOLONE SODIUM SUCC 40 MG IJ SOLR
40.0000 mg | Freq: Every day | INTRAMUSCULAR | Status: DC
  Administered 2022-12-20: 40 mg via INTRAVENOUS
  Filled 2022-12-19: qty 1

## 2022-12-19 NOTE — Progress Notes (Signed)
Spoke with family, informed of patient transfer to [id number].  Family agreeable to transfer.  Verified family contacts and phone numbers with family.

## 2022-12-19 NOTE — Progress Notes (Signed)
Daughter in [place], would like to be called for updates [phone]. This was ok'd by patient, patient stated " she can be the first one you guys call".

## 2022-12-19 NOTE — Plan of Care (Signed)
Discussed with patient plan of care for the evening, pain management and her transfer need to a higher level of care d/t Bipap increased needs with some teach back displayed.  Problem: Education: Goal: Ability to describe self-care measures that may prevent or decrease complications (Diabetes Survival Skills Education) will improve Outcome: Progressing

## 2022-12-19 NOTE — Progress Notes (Signed)
SLP Cancellation Note  Patient Details Name: [name] MRN: [id number] DOB: 1950-09-19   Cancelled treatment:        Patient currently on bipap. Received new orders from MD for repeat swallow evaluation. Clarified with Dr. Francine Graven and instrumental testing is desired to r/o silent aspiration. Will plan to complete FEES when patient off Bipap.   Ferdinand Lango MA, CCC-SLP    [name] Meryl 12/19/2022, 11:40 AM

## 2022-12-19 NOTE — Progress Notes (Signed)
PCCM Update:  Patient remains bipap dependent. Desaturated to 80% while on HHFNC to take a couple pills. Will transfer to the ICU for closer monitoring and high potential for intubation.  Melody Comas, MD [facility] Office: [phone]   See Amion for personal pager PCCM on call pager [phone] until 7pm. Please call Elink 7p-7a. [phone]

## 2022-12-19 NOTE — Progress Notes (Signed)
OT Cancellation Note  Patient Details Name: [name] MRN: [id number] DOB: 29-Dec-1950   Cancelled Treatment:    Reason Eval/Treat Not Completed: Patient not medically ready (Per RN, hold d/t patient on bipap. Per RN hold for rest of day, Pt likely to go to ICU)  12/19/2022  AB, OTR/L  Acute Rehabilitation Services  Office: [phone]   Tristan Schroeder 12/19/2022, 2:00 PM

## 2022-12-19 NOTE — Plan of Care (Signed)
  Problem: Education: Goal: Ability to describe self-care measures that may prevent or decrease complications (Diabetes Survival Skills Education) will improve Outcome: Progressing   Problem: Coping: Goal: Ability to adjust to condition or change in health will improve Outcome: Progressing   Problem: Fluid Volume: Goal: Ability to maintain a balanced intake and output will improve Outcome: Progressing   Problem: Metabolic: Goal: Ability to maintain appropriate glucose levels will improve Outcome: Progressing   Problem: Nutritional: Goal: Maintenance of adequate nutrition will improve Outcome: Progressing Goal: Progress toward achieving an optimal weight will improve Outcome: Progressing   Problem: Skin Integrity: Goal: Risk for impaired skin integrity will decrease Outcome: Progressing   Problem: Tissue Perfusion: Goal: Adequacy of tissue perfusion will improve Outcome: Progressing

## 2022-12-19 NOTE — Progress Notes (Signed)
Report given to [facility] on [place].  RT called to help transport around 2045.  And patient does have abrasion to Nose from Bipap with a foam dressing to help.

## 2022-12-19 NOTE — Progress Notes (Signed)
NAME:  [name], MRN:  [id number], DOB:  24-Sep-1950, LOS: 2 ADMISSION DATE:  12/17/2022, CONSULTATION DATE:  12/17/22 REFERRING MD:  EDP, CHIEF COMPLAINT:  hypoxia   History of Present Illness:  72yo female with hx dCHF, Afib on eliquis, chronic respiratory failure in the setting COPD, pulmonary fibrosis on chronic 5L [place] recently hospitalized 4/4-4/18 with acute on chronic respiratory failure r/t CAP treated with 5 days rocephin/doxy and steroid taper. Returns to ER 4/23 with ongoing SOB and hypoxia in PCP office with sats 80's despite her home 5L O2.   States she "felt better" and was nearing baseline after d/c 4/18. Had a few days of feeling well, good energy, minimal SOB above baseline. Then 4/22 developed worsening cough, wheezing, SOB, malaise. Denies fever, chest pain, abd pain, n/v/d. Denies obvious episodes of choking or aspiration. Denies dysphagia.   Pertinent  Medical History   has a past medical history of Chronic diastolic CHF (congestive heart failure), Chronic respiratory failure, Coronary atherosclerosis, Emphysema of lung, Hypercholesterolemia, Hypertension, NSIP (nonspecific interstitial pneumonia), Osteoarthritis, Persistent atrial fibrillation, and Thoracic aortic ectasia.   Significant Hospital Events: Including procedures, antibiotic start and stop dates in addition to other pertinent events   4/23 CT chest>> Pulmonary fibrosis. Bronchiectasis. There are patchy ground-glass densities in the periphery of both lungs, more so in the lower lung fields suggesting possible superimposed interstitial pneumonia. There is interval decrease in patchy alveolar infiltrates in both lungs in comparison with the CT done on 11/28/2022. There is no pleural effusion or pneumothorax.   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 Coronary artery calcifications are seen.  Interim History / Subjective:  Chest x-ray looks worse. Requiring higher  flows of O2 to maintain sats  Objective   Blood pressure 128/71, pulse 73, temperature 97.7 F (36.5 C), temperature source Oral, resp. rate (!) 27, height 5' (1.524 m), weight 87.3 kg, SpO2 (!) 85 %.    FiO2 (%):  [75 %-83 %] 75 % PEEP:  [10 cmH20] 10 cmH20 Pressure Support:  [10 cmH20] 10 cmH20   Intake/Output Summary (Last 24 hours) at 12/19/2022 1056 Last data filed at 12/19/2022 0952 Gross per 24 hour  Intake 1270.02 ml  Output 2950 ml  Net -1679.98 ml   Filed Weights   12/17/22 1011 12/17/22 1956  Weight: 86.6 kg 87.3 kg    Examination: Awake and follows commands.  Placed back on BiPAP due to desaturations No JVD is appreciated Decreased air movement throughout Heart sounds are regular Abdomen soft obese nontender Extremities warm with edema  Resolved Hospital Problem list     Assessment & Plan:  Acute on chronic respiratory failure  Pulmonary fibrosis with suspected progression of her ILD COPD with exacerbation  Suspected HCAP  History of atrial fibrillation on Eliquis.  PLAN -  Currently in progressive care unit per [facility] hospitalist service but may need to move to intensive care unit in the near future Continue IV antibiotics suspected pneumonia Continue steroids Continue to follow autoimmune labs Appreciate speech therapies swallow evaluatio   Pulmonary will continue to follow  Best Practice (right click and "Reselect all SmartList Selections" daily)  Per [facility]  Labs   CBC: Recent Labs  Lab 12/17/22 1020 12/17/22 1037 12/18/22 0002 12/19/22 0022  WBC 18.6*  --  10.5 18.2*  NEUTROABS 14.8*  --   --  16.8*  HGB 13.0 13.9 10.9* 10.7*  HCT 39.5 41.0 32.8* 31.5*  MCV 91.2  --  89.9 89.0  PLT  156  --  132* 126*    Basic Metabolic Panel: Recent Labs  Lab 12/17/22 1020 12/17/22 1037 12/18/22 0002 12/18/22 0833 12/19/22 0022  NA 138 133* 134* 136 137  K 4.0 7.3* 6.1* 5.3* 4.4  CL 101  --  103 99 103  CO2 26  --  GLUCOSE 110*  --   171* 166* 171*  BUN 25*  --  27* 30* 38*  CREATININE 1.00  --  1.10* 1.21* 1.25*  CALCIUM 9.4  --  8.9 9.4 9.1  MG  --   --   --  2.3 2.2   GFR: Estimated Creatinine Clearance: 39.9 mL/min (A) (by C-G formula based on SCr of 1.25 mg/dL (H)). Recent Labs  Lab 12/17/22 1020 12/17/22 1210 12/17/22 1430 12/18/22 0002 12/19/22 0022  PROCALCITON  --   --  <0.10  --   --   WBC 18.6*  --   --  10.5 18.2*  LATICACIDVEN 2.3* 2.0*  --   --   --     Liver Function Tests: No results for input(s): "AST", "ALT", "ALKPHOS", "BILITOT", "PROT", "ALBUMIN" in the last 168 hours. No results for input(s): "LIPASE", "AMYLASE" in the last 168 hours. No results for input(s): "AMMONIA" in the last 168 hours.  ABG    Component Value Date/Time   HCO3 29.9 (H) 12/17/2022 1037   TCO2 31 12/17/2022 1037   O2SAT 59 12/17/2022 1037     Coagulation Profile: No results for input(s): "INR", "PROTIME" in the last 168 hours.  Cardiac Enzymes: Recent Labs  Lab 12/17/22 1430  CKTOTAL 33*    HbA1C: Hgb A1c MFr Bld  Date/Time Value Ref Range Status  12/01/2022 08:24 AM 6.6 (H) 4.8 - 5.6 % Final    Comment:    (NOTE) Pre diabetes:          5.7%-6.4%  Diabetes:              >6.4%  Glycemic control for   <7.0% adults with diabetes   02/06/2016 04:47 AM 6.2 (H) 4.8 - 5.6 % Final    Comment:    (NOTE)         Pre-diabetes: 5.7 - 6.4         Diabetes: >6.4         Glycemic control for adults with diabetes: <7.0     CBG: Recent Labs  Lab 12/18/22 0606 12/18/22 1125 12/18/22 1653 12/18/22 2114 12/19/22 0609  GLUCAP 130* 153* 142* 178* 139*    Steve Ugonna Keirsey ACNP Acute Care Nurse Practitioner [facility] Please consult Amion 12/19/2022, 10:56 AM

## 2022-12-19 NOTE — Progress Notes (Signed)
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  Patient improved clinically on discharge however with worsening shortness of breath and worsening respiratory status after steroid taper down presented back to the ED with worsening cough, bloodstained sputum, wheezing and dyspnea.  CT chest done without contrast with worsening interstitial changes noted with concerns for superimposed interstitial pneumonia.  Patient admitted placed on IV antibiotics, IV steroids, bronchodilators, PCCM consulted.   Assessment & Plan:   Principal Problem:   Pulmonary fibrosis Active Problems:   Healthcare-associated pneumonia   Acute on chronic respiratory failure with hypoxia   Anticoagulated   Paroxysmal atrial fibrillation   Chronic diastolic heart failure   Essential hypertension   Type 2 diabetes mellitus with other specified complication   Chronic pain syndrome   Anxiety disorder   Hypercholesterolemia   Thoracic aortic ectasia   OSA treated with BiPAP   Obesity (BMI 30-39.9)  #1 acute on chronic respiratory failure with hypoxia/?  Progressive interstitial lung disease/healthcare associated pneumonia -Patient presented with complaints of progressively worsening shortness of breath, productive cough, hemoptysis that started 1 day prior to admission after being tapered off steroids during recent hospitalization. -Baseline O2 requirements of 5 L nasal cannula but O2 sats noted  to be as low as 82% on 5 L prior to admission. -CT chest done on admission concerning for progressive worsening interstitial lung disease in the setting of superimposed pneumonia. -Patient with no significant improvement with shortness of breath since admission, increasing O2 requirements currently on BiPAP, was on BiPAP overnight and then subsequently placed back on heated high flow nasal cannula with FiO2 of 83%, O2 flow rate of 45 and noted to have sats in the high 80s and placed back on BiPAP this morning.  -Sputum cultures pending. -MRSA PCR negative. -Pneumocystis PCR, autoimmune and hypersensitivity panel pending, respiratory viral panel pending, C4 C3 complement pending, ANCA, ANA pending, cyclic citrul peptide antibody pending. -Aldolase level noted at 10.4. -Was on IV vancomycin which has been discontinued and patient placed on IV doxycycline. -Continue IV cefepime, IV steroids, Mucinex, Brovana,yupelri. -Increase Mucinex to 1200 mg twice daily. -Status post Lasix 40 mg IV x 1 on 12/18/2022 with a urine output of 2.950 L over the past 24 hours. -PCCM recommending prolonged steroid taper with outpatient follow-up for consideration of CellCept therapy. -Low threshold for transfer to ICU if worsening respiratory status. -PCCM following and appreciate input and recommendations.  2.  Paroxysmal atrial fibrillation on chronic anticoagulation therapy -Continue diltiazem, Tikosyn for rate control.   -Eliquis for anticoagulation.   3.  Chronic diastolic CHF -Patient currently euvolemic on examination does not look volume overloaded. -BNP on admission noted at 124.6. -Recent 2D echo from 12/01/2022 with EF of 60 to 65%, no wall motion abnormalities, grade 1 diastolic dysfunction. -Patient status post Lasix 40 mg IV x 1 per pulmonary on 12/18/2022 with a urine output of 2.950 L over the past 24 hours.   -Follow.   4.  Hypertension -BP stable.  5.  Diabetes mellitus type  2, without long-term  use of insulin -Hemoglobin A1c noted at 6.6 on 12/01/2022. -Seems to be diet controlled as patient not on any diabetic medications prior to admission. -Patient on IV steroids and as such CBGs ordered.   -CBG noted at 139 this morning. -SSI.  6.  Chronic pain -Norco as needed.    7.  Anxiety -Continue Ativan as needed.    8.  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 -Outpatient follow-up.  9.  Obesity -BMI 37.3 kg/m -Lifestyle modification -Outpatient follow-up with PCP.  10.  OSA -BiPAP nightly and as needed.  11.  Hyperlipidemia -Statin.    DVT prophylaxis: Eliquis Code Status: Full Family Communication: Updated patient.  No family at bedside.  Disposition: Remain in progressive care.  If worsening respiratory status may need to be transferred to the ICU.  Status is: Inpatient Remains inpatient appropriate because: Severity of illness   Consultants:  PCCM: Dr. Francine Graven 12/19/2022   Procedures:  CT chest without contrast 12/08/2022 Chest x-ray 12/18/2022, 12/19/2022   Antimicrobials:  IV cefepime 12/06/2022>>>> IV vancomycin 12/06/2022>>>> 12/18/2022 IV doxycycline 12/18/2022>>>>   Subjective: Patient laying in bed.  Currently on BiPAP.  Noted to have been placed on BiPAP overnight.  This morning noted on high flow nasal cannula FiO2 of 83% with a O2 flow rate of 45 and placed back on BiPAP.  Patient states yesterday of felt a little bit better in terms of her shortness of breath during the day but states breathing has worsened again.  No chest pain.  No abdominal pain.    Objective: Vitals:   12/18/22 2307 12/18/22 2333 12/19/22 0353 12/19/22 0712  BP:  102/61 115/68 128/71  Pulse:  71 68 73  Resp:  (!) 23 (!) 23 (!) 27  Temp:  (!) 97.2 F (36.2 C) (!) 97.4 F  (36.3 C) 97.7 F (36.5 C)  TempSrc:  Axillary Axillary Oral  SpO2: 91% 93% 96% (!) 85%  Weight:      Height:        Intake/Output Summary (Last 24 hours) at 12/19/2022 1025 Last data filed at 12/19/2022 0952 Gross per 24 hour  Intake 1270.02 ml  Output 2950 ml  Net -1679.98 ml    Filed Weights   12/03/2022 1011 12/16/2022 1956  Weight: 86.6 kg 87.3 kg    Examination:  General exam: On BiPAP.  Respiratory system: Diffuse inspiratory crackles bilaterally R > L.  No significant rhonchi.  No significant wheezing. Cardiovascular system: Regular rate rhythm no murmurs rubs or gallops.  No JVD.  No lower extremity edema.  Gastrointestinal system: Abdomen is soft, nontender, nondistended, positive bowel sounds.  No rebound.  No guarding.  Central nervous system: Alert and oriented.  No focal neurological deficits.  Moving extremities spontaneously.   Extremities: Symmetric 5 x 5 power. Skin: No rashes, lesions or ulcers Psychiatry: Judgement and insight appear normal. Mood & affect appropriate.     Data Reviewed: I have personally reviewed following labs and imaging studies  CBC: Recent Labs  Lab 12/20/2022 1020 12/16/2022 1037 12/18/22 0002 12/19/22 0022  WBC 18.6*  --  10.5 18.2*  NEUTROABS 14.8*  --   --  16.8*  HGB 13.0 13.9 10.9* 10.7*  HCT 39.5 41.0 32.8* 31.5*  MCV 91.2  --  89.9 89.0  PLT  156  --  132* 126*     Basic Metabolic Panel: Recent Labs  Lab 12/04/2022 1020 12/07/2022 1037 12/18/22 0002 12/18/22 0833 12/19/22 0022  NA 138 133* 134* 136 137  K 4.0 7.3* 6.1* 5.3* 4.4  CL 101  --  103 99 103  CO2 26  --  25 23 25   GLUCOSE 110*  --  171* 166* 171*  BUN 25*  --  27* 30* 38*  CREATININE 1.00  --  1.10* 1.21* 1.25*  CALCIUM 9.4  --  8.9 9.4 9.1  MG  --   --   --  2.3 2.2     GFR: Estimated Creatinine Clearance: 39.9 mL/min (A) (by C-G formula based on SCr of 1.25 mg/dL (H)).  Liver Function Tests: No results for input(s): "AST", "ALT", "ALKPHOS",  "BILITOT", "PROT", "ALBUMIN" in the last 168 hours.  CBG: Recent Labs  Lab 12/18/22 0606 12/18/22 1125 12/18/22 1653 12/18/22 2114 12/19/22 0609  GLUCAP 130* 153* 142* 178* 139*      Recent Results (from the past 240 hour(s))  SARS Coronavirus 2 by RT PCR (hospital order, performed in [facility] hospital lab) *cepheid single result test* Anterior Nasal Swab     Status: None   Collection Time: 12/23/2022 10:03 AM   Specimen: Anterior Nasal Swab  Result Value Ref Range Status   SARS Coronavirus 2 by RT PCR NEGATIVE NEGATIVE Final    Comment: Performed at [facility], [address], [place], [place] [address]  Expectorated Sputum Assessment w Gram Stain, Rflx to Resp Cult     Status: None   Collection Time: 12/11/2022  3:50 PM   Specimen: Expectorated Sputum  Result Value Ref Range Status   Specimen Description EXPECTORATED SPUTUM  Final   Special Requests NONE  Final   Sputum evaluation   Final    Sputum specimen not acceptable for testing.  Please recollect.   notified RN Rowe Pavy ON 12/20/2022 @ 2027 BY DRT Performed at [facility], [address] [place], [place] [address]    Report Status 12/01/2022 FINAL  Final  MRSA Next Gen by PCR, Nasal     Status: None   Collection Time: 12/12/2022  8:08 PM   Specimen: Nasal Mucosa; Nasal Swab  Result Value Ref Range Status   MRSA by PCR Next Gen NOT DETECTED NOT DETECTED Final    Comment: (NOTE) The GeneXpert MRSA Assay (FDA approved for NASAL specimens only), is one component of a comprehensive MRSA colonization surveillance program. It is not intended to diagnose MRSA infection nor to guide or monitor treatment for MRSA infections. Test performance is not FDA approved in patients less than 76 years old. Performed at [facility], [address], [place], [place] [address]          Radiology Studies: DG CHEST PORT 1 VIEW  Result Date: 12/19/2022 CLINICAL DATA:  Hypoxia. EXAM: PORTABLE CHEST 1 VIEW  COMPARISON:  December 18, 2022 FINDINGS: Cardiomediastinal silhouette is normal. Calcific atherosclerotic disease and tortuosity of the aorta. Patient is rotated to the right. There is an opacity in the right hilar/suprahilar region which could be partially related to the overlapping heart shadow, however underlying lymphadenopathy/mass or airspace consolidation cannot be excluded. There are chronic fibrotic interstitial changes in bilateral lungs. Osseous structures are without acute abnormality. Soft tissues are grossly normal. IMPRESSION: 1. Opacity in the right hilar/suprahilar region which could be partially related to the overlapping heart shadow, however underlying lymphadenopathy/mass  or airspace consolidation cannot be excluded. Further evaluation with CT of the chest with contrast may be considered. 2. Chronic fibrotic interstitial changes in bilateral lungs. Electronically Signed   By: Ted Mcalpine M.D.   On: 12/19/2022 10:12   DG CHEST PORT 1 VIEW  Result Date: 12/18/2022 CLINICAL DATA:  Respiratory failure EXAM: PORTABLE CHEST 1 VIEW COMPARISON:  12/19/2022 CT FINDINGS: Cardiac shadow is stable. Lungs are well aerated bilaterally. Diffuse fibrotic changes are again noted similar to that seen on the prior exam. Some generalized increased density is noted in the right lung however when compared with the prior exam. Portion of this is related to patient rotation to the right. There is also a likely some central developing airspace opacity. Continued follow-up is recommended. IMPRESSION: Increasing opacification in the right lung as described. Follow-up is recommended. Electronically Signed   By: Alcide Clever M.D.   On: 12/18/2022 13:07   CT Chest Wo Contrast  Result Date: 12/09/2022 CLINICAL DATA:  Pneumonia, worsening dyspnea, productive cough EXAM: CT CHEST WITHOUT CONTRAST TECHNIQUE: Multidetector CT imaging of the chest was performed following the standard protocol without IV contrast.  RADIATION DOSE REDUCTION: This exam was performed according to the departmental dose-optimization program which includes automated exposure control, adjustment of the mA and/or kV according to patient size and/or use of iterative reconstruction technique. COMPARISON:  Previous CT done on 11/28/2022, previous chest radiographs including the study done on 12/09/2022 FINDINGS: Cardiovascular: There is aneurysmal dilation in proximal descending thoracic aorta measuring 5.4 x 5.2 cm. There is ectasia of the inferior course of thoracic aorta measuring 4.6 cm in diameter. Ascending thoracic aorta measures 3.9 cm. There are scattered calcifications in thoracic aorta and its major branches. Coronary artery calcifications are seen. Mediastinum/Nodes: No new significant lymphadenopathy is seen in mediastinum. Lungs/Pleura: There is ectasia of bronchi. Increased interstitial markings are seen in the periphery of both lungs, more so in the lower lung fields. Patchy ground-glass densities are seen in the periphery of both lungs. There is interval decrease in patchy alveolar infiltrates in both lungs. There is no pleural effusion or pneumothorax. Upper Abdomen: Visualized upper abdomen is unremarkable. Musculoskeletal: No acute findings are seen. IMPRESSION: Pulmonary fibrosis. Bronchiectasis. There are patchy ground-glass densities in the periphery of both lungs, more so in the lower lung fields suggesting possible superimposed interstitial pneumonia. There is interval decrease in patchy alveolar infiltrates in both lungs in comparison with the CT done on 11/28/2022. There is no pleural effusion or pneumothorax. 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 Coronary artery calcifications are seen. Electronically Signed   By: Ernie Avena M.D.   On: 12/04/2022 11:47        Scheduled Meds:  apixaban  5 mg Oral BID   arformoterol  15 mcg Nebulization BID   atorvastatin  40 mg Oral  Daily   brimonidine  1 drop Both Eyes BID   diltiazem  240 mg Oral Daily   dofetilide  500 mcg Oral BID   guaiFENesin  1,200 mg Oral BID   insulin aspart  0-5 Units Subcutaneous QHS   insulin aspart  0-9 Units Subcutaneous TID WC   methylPREDNISolone (SOLU-MEDROL) injection  125 mg Intravenous Q12H   mouth rinse  15 mL Mouth Rinse 4 times per day   revefenacin  175 mcg Nebulization Daily   sodium chloride flush  3 mL Intravenous Q12H   Continuous Infusions:  ceFEPime (MAXIPIME) IV 2 g (12/19/22 0950)   doxycycline (VIBRAMYCIN) IV Stopped (  12/19/22 0228)     LOS: 2 days    Time spent: 40 minutes    Ramiro Harvest, MD [facility]   To contact the attending provider between 7A-7P or the covering provider during after hours 7P-7A, please log into the web site [email or web address] and access using universal  password for that web site. If you do not have the password, please call the hospital operator.  12/19/2022, 10:25 AM

## 2022-12-19 NOTE — Progress Notes (Signed)
PT Cancellation Note  Patient Details Name: [name] MRN: [id number] DOB: 16-Jun-1951   Cancelled Treatment:    Reason Eval/Treat Not Completed: Patient not medically ready. Pt on bipap.    Angelina Ok [facility] 12/19/2022, 12:29 PM Skip Mayer PT Acute [employer] [phone]

## 2022-12-20 DIAGNOSIS — J9601 Acute respiratory failure with hypoxia: Secondary | ICD-10-CM | POA: Diagnosis not present

## 2022-12-20 DIAGNOSIS — J8410 Pulmonary fibrosis, unspecified: Secondary | ICD-10-CM | POA: Diagnosis not present

## 2022-12-20 LAB — RESPIRATORY PANEL BY PCR

## 2022-12-20 LAB — GLUCOSE, CAPILLARY
Glucose-Capillary: 154 mg/dL — ABNORMAL HIGH (ref 70–99)
Glucose-Capillary: 169 mg/dL — ABNORMAL HIGH (ref 70–99)
Glucose-Capillary: 136 mg/dL — ABNORMAL HIGH (ref 70–99)
Glucose-Capillary: 174 mg/dL — ABNORMAL HIGH (ref 70–99)

## 2022-12-20 LAB — BASIC METABOLIC PANEL
Anion gap: 10 (ref 5–15)
BUN: 48 mg/dL — ABNORMAL HIGH (ref 8–23)
CO2: 23 mmol/L (ref 22–32)
Calcium: 8.9 mg/dL (ref 8.9–10.3)
Chloride: 105 mmol/L (ref 98–111)
Creatinine, Ser: 0.96 mg/dL (ref 0.44–1.00)
GFR, Estimated: >60 mL/min (ref >60)
Glucose, Bld: 142 mg/dL — ABNORMAL HIGH (ref 70–99)
Potassium: 4.3 mmol/L (ref 3.5–5.1)
Sodium: 138 mmol/L (ref 135–145)

## 2022-12-20 LAB — CBC WITH DIFFERENTIAL/PLATELET
Abs Immature Granulocytes: 0.08 10*3/uL — ABNORMAL HIGH (ref 0.00–0.07)
Basophils Absolute: 0 10*3/uL (ref 0.0–0.1)
Basophils Relative: 0 %
Eosinophils Absolute: 0 10*3/uL (ref 0.0–0.5)
Eosinophils Relative: 0 %
HCT: 31.4 % — ABNORMAL LOW (ref 36.0–46.0)
Hemoglobin: 10.7 g/dL — ABNORMAL LOW (ref 12.0–15.0)
Immature Granulocytes: 1 %
Lymphocytes Relative: 2 %
Lymphs Abs: 0.3 10*3/uL — ABNORMAL LOW (ref 0.7–4.0)
MCH: 30.9 pg (ref 26.0–34.0)
MCHC: 34.1 g/dL (ref 30.0–36.0)
MCV: 90.8 fL (ref 80.0–100.0)
Monocytes Absolute: 0.7 10*3/uL (ref 0.1–1.0)
Monocytes Relative: 4 %
Neutro Abs: 13.7 10*3/uL — ABNORMAL HIGH (ref 1.7–7.7)
Neutrophils Relative %: 93 %
Platelets: 112 10*3/uL — ABNORMAL LOW (ref 150–400)
RBC: 3.46 MIL/uL — ABNORMAL LOW (ref 3.87–5.11)
RDW: 16.4 % — ABNORMAL HIGH (ref 11.5–15.5)
WBC: 14.8 10*3/uL — ABNORMAL HIGH (ref 4.0–10.5)
nRBC: 0 % (ref 0.0–0.2)

## 2022-12-20 LAB — LEGIONELLA PNEUMOPHILA SEROGP 1 UR AG: L. pneumophila Serogp 1 Ur Ag: NEGATIVE

## 2022-12-20 LAB — MAGNESIUM: Magnesium: 2.1 mg/dL (ref 1.7–2.4)

## 2022-12-20 LAB — PROCALCITONIN: Procalcitonin: <0.1 ng/mL

## 2022-12-20 MED ORDER — BUDESONIDE 0.5 MG/2ML IN SUSP
0.5000 mg | Freq: Two times a day (BID) | RESPIRATORY_TRACT | Status: DC
  Administered 2022-12-20 – 2022-12-24 (×9): 0.5 mg via RESPIRATORY_TRACT
  Filled 2022-12-20 (×9): qty 2

## 2022-12-20 MED ORDER — METHYLPREDNISOLONE SODIUM SUCC 125 MG IJ SOLR
125.0000 mg | Freq: Two times a day (BID) | INTRAMUSCULAR | Status: DC
  Administered 2022-12-20 – 2022-12-23 (×6): 125 mg via INTRAVENOUS
  Filled 2022-12-20 (×6): qty 2

## 2022-12-20 NOTE — Progress Notes (Signed)
[facility] PROGRESS NOTE  Patient: [name] ZOX:[id number]   PCP: [name], MD DOB: [birth year]   DOA: 12/21/2022   DOS: 12/20/2022   Pt care currently transferred to ICU service. Highly appreciate PCCM team's assistance in management. TRH will sign off for now. Please call us to resume care once she is ready to come out of ICU.  Author: Lynden Oxford, MD [facility] 12/20/2022 8:27 AM   If 7PM-7AM, please contact night-coverage at [email or web address]

## 2022-12-20 NOTE — Progress Notes (Signed)
NAME:  [name], MRN:  [id number], DOB:  02/25/1951, LOS: 3 ADMISSION DATE:  11/25/2022, CONSULTATION DATE:  12/09/2022 REFERRING MD:  EDP, CHIEF COMPLAINT:  hypoxia   History of Present Illness:  72yo female with hx dCHF, Afib on eliquis, chronic respiratory failure due to COPD, pulmonary fibrosis on chronic 5L [place] recently hospitalized 4/4-4/18 with acute on chronic respiratory failure r/t CAP treated with 5 days rocephin/doxy and steroid taper. Returns to ER 4/23 with ongoing SOB and hypoxia in PCP office with sats 80's despite her home 5L O2.   States she "felt better" and was nearing baseline after d/c 4/18. Had a few days of feeling well, good energy, minimal SOB above baseline. Then 4/22 developed worsening cough, wheezing, SOB, malaise.    She has progressive interstitial changes on serial CT Chest scans 12/05/2022, 11/28/22, 07/16/22, 01/15/22 reviewed.    She had weak positive CCP ab in 2019, negative ANA and rheumatoid factor  Pertinent  Medical History   has a past medical history of Chronic diastolic CHF (congestive heart failure) (HCC), Chronic respiratory failure (HCC), Coronary atherosclerosis, Emphysema of lung (HCC), Hypercholesterolemia, Hypertension, NSIP (nonspecific interstitial pneumonia) (HCC), Osteoarthritis, Persistent atrial fibrillation (HCC), and Thoracic aortic ectasia (HCC).   Significant Hospital Events: Including procedures, antibiotic start and stop dates in addition to other pertinent events   4/23 CT chest>> Pulmonary fibrosis. Bronchiectasis. There are patchy ground-glass densities in the periphery of both lungs, more so in the lower lung fields suggesting possible superimposed interstitial pneumonia. There is interval decrease in patchy alveolar infiltrates in both lungs in comparison with the CT done on 11/28/2022. There is no pleural effusion or pneumothorax.   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 Coronary  artery calcifications are seen. 4/25 transferred to ICU for BiPAP dependence  Interim History / Subjective:   Remains critically ill on heated high flow nasal cannula 100% / 50 L. Cough with mild purulent sputum, blood-tinged  Objective   Blood pressure 118/71, pulse 69, temperature 97.9 F (36.6 C), temperature source Oral, resp. rate (!) 24, height 5' (1.524 m), weight 87.3 kg, SpO2 94 %.    FiO2 (%):  [70 %-100 %] 100 %   Intake/Output Summary (Last 24 hours) at 12/20/2022 1252 Last data filed at 12/20/2022 1200 Gross per 24 hour  Intake 1136.52 ml  Output 1025 ml  Net 111.52 ml    Filed Weights   11/29/2022 1011 11/29/2022 1956  Weight: 86.6 kg 87.3 kg    Examination: Elderly obese woman sitting up in bed No JVD , mild pallor, no icterus Dry crackles, no rhonchi, no accessory muscle use S1-S2 regular, no murmur Abdomen soft obese nontender Extremities warm with edema  Labs show normal electrolytes, decreased leukocytosis, stable anemia  Chest x-ray 4/25 shows right suprahilar opacity with chronic fibrotic interstitial changes  Resolved Hospital Problem list     Assessment & Plan:  Acute on chronic respiratory failure  Progressive ILD -DD includes IPF, fibrotic NSIP versus HSP , she had decreased to her baseline 5 L oxygen after 2 weeks of inpatient treatment which suggest steroid responsive ILD COPD  Suspected HCAP    PLAN -  Continue heated high flow nasal cannula and dial down FiO2 as oxygenation improves Increase volume Medrol Pak up to 125 every 12 for at least 3 days Procalcitonin is low, ideally antibiotics can be stopped but family seems fixated on this, will continue another 24 hours and then continue doxycycline alone -Budesonide/Brovana/Yupelri combination  History of atrial fibrillation -Continue Tikosyn and apixaban  Steroid-induced hyperglycemia -SSI   Best Practice (right click and "Reselect all SmartList Selections" daily)  Regular  diet Apixaban Full CODE STATUS  Family including sister and daughter-in-law updated to plan of care  Labs   CBC: Recent Labs  Lab 12/01/2022 1020 12/02/2022 1037 12/18/22 0002 12/19/22 0022 12/20/22 0207  WBC 18.6*  --  10.5 18.2* 14.8*  NEUTROABS 14.8*  --   --  16.8* 13.7*  HGB 13.0 13.9 10.9* 10.7* 10.7*  HCT 39.5 41.0 32.8* 31.5* 31.4*  MCV 91.2  --  89.9 89.0 90.8  PLT 156  --  132* 126* 112*     Basic Metabolic Panel: Recent Labs  Lab 12/02/2022 1020 11/27/2022 1037 12/18/22 0002 12/18/22 0833 12/19/22 0022 12/20/22 0207  NA 138 133* 134* 136 137 138  K 4.0 7.3* 6.1* 5.3* 4.4 4.3  CL 101  --  103 99 103 105  CO2 26  --  25 23 25 23   GLUCOSE 110*  --  171* 166* 171* 142*  BUN 25*  --  27* 30* 38* 48*  CREATININE 1.00  --  1.10* 1.21* 1.25* 0.96  CALCIUM 9.4  --  8.9 9.4 9.1 8.9  MG  --   --   --  2.3 2.2 2.1    GFR: Estimated Creatinine Clearance: 52 mL/min (by C-G formula based on SCr of 0.96 mg/dL). Recent Labs  Lab 12/05/2022 1020 12/10/2022 1210 12/14/2022 1430 12/18/22 0002 12/19/22 0022 12/20/22 0207  PROCALCITON  --   --  <0.10  --   --   --   WBC 18.6*  --   --  10.5 18.2* 14.8*  LATICACIDVEN 2.3* 2.0*  --   --   --   --      Liver Function Tests: No results for input(s): "AST", "ALT", "ALKPHOS", "BILITOT", "PROT", "ALBUMIN" in the last 168 hours. No results for input(s): "LIPASE", "AMYLASE" in the last 168 hours. No results for input(s): "AMMONIA" in the last 168 hours.  ABG    Component Value Date/Time   HCO3 29.9 (H) 12/12/2022 1037   TCO2 31 12/06/2022 1037   O2SAT 59 11/28/2022 1037     Coagulation Profile: No results for input(s): "INR", "PROTIME" in the last 168 hours.  Cardiac Enzymes: Recent Labs  Lab 12/14/2022 1430  CKTOTAL 33*     HbA1C: Hgb A1c MFr Bld  Date/Time Value Ref Range Status  12/01/2022 08:24 AM 6.6 (H) 4.8 - 5.6 % Final    Comment:    (NOTE) Pre diabetes:          5.7%-6.4%  Diabetes:               >6.4%  Glycemic control for   <7.0% adults with diabetes   02/06/2016 04:47 AM 6.2 (H) 4.8 - 5.6 % Final    Comment:    (NOTE)         Pre-diabetes: 5.7 - 6.4         Diabetes: >6.4         Glycemic control for adults with diabetes: <7.0     CBG: Recent Labs  Lab 12/19/22 1137 12/19/22 1615 12/19/22 2130 12/20/22 0709 12/20/22 1119  GLUCAP 139* 123* 145* 154* 169*    My independent critical care time was 34 minutes Cyril Mourning MD. FCCP. [facility] Pager : [phone]  If no response to pager , please call [phone] until 7 pm  After 7:00 pm call Elink  [phone]    12/20/2022, 12:52 PM

## 2022-12-20 NOTE — Evaluation (Signed)
Physical Therapy Evaluation Patient Details Name: [name] MRN: [id number] DOB: 09/17/1950 Today's Date: 12/20/2022  History of Present Illness  The pt is a 72 yo female presenting from MD's office on 4/23 with worsening cough, shortness of breath, wheezing and blood tinged sputum. Required bipap, weaned to [facility]. Pt admitted 4/4-4/18 with PNA and pulmonary fibrosis. Pt PMH includes: CHF, emphysema, HTN, OA, afib, and chronic pain.  Clinical Impression  Pt admitted with above diagnosis. Pt with very poor endurance for activity and desaturates with little mobility on HHFNC at 100% FiO2.  Pt didn't need a lot of physical assist but needs incr time to recover O2 saturation. Will follow acutely and progress as pt able.  Pt currently with functional limitations due to the deficits listed below (see PT Problem List). Pt will benefit from acute skilled PT to increase their independence and safety with mobility to allow discharge.       Vital signs:  74 bpm, 94% 100% HHFNC @  50LO2, 130/72- at rest   Desaturated to 80% with bed mobility and up to 85% prior to transfer to chair.   After transfer O2 as low as 78% and took 5 min to return to 90%.   Recommendations for follow up therapy are one component of a multi-disciplinary discharge planning process, led by the attending physician.  Recommendations may be updated based on patient status, additional functional criteria and insurance authorization.  Follow Up Recommendations       Assistance Recommended at Discharge Intermittent Supervision/Assistance  Patient can return home with the following  A little help with walking and/or transfers;Assistance with cooking/housework;Assist for transportation;Help with stairs or ramp for entrance    Equipment Recommendations None recommended by PT  Recommendations for Other Services       Functional Status Assessment Patient has had a recent decline in their functional status and demonstrates the  ability to make significant improvements in function in a reasonable and predictable amount of time.     Precautions / Restrictions Precautions Precautions: Fall Precaution Comments: Monitor O2 Restrictions Weight Bearing Restrictions: No      Mobility  Bed Mobility Overal bed mobility: Needs Assistance Bed Mobility: Supine to Sit     Supine to sit: Min guard     General bed mobility comments: increased time and effort, HOB up.  Desaturated to 80% with bed mobility and up to 85% prior to transfer to chair.    Transfers Overall transfer level: Needs assistance Equipment used: Rolling walker (2 wheels) Transfers: Bed to chair/wheelchair/BSC, Sit to/from Stand Sit to Stand: Min guard   Step pivot transfers: Min guard       General transfer comment: close guard for safety and multiple lines.  After transfer O2 as low as 78% and took 5 min to return to 90%.    Ambulation/Gait                  Stairs            Wheelchair Mobility    Modified Rankin (Stroke Patients Only)       Balance Overall balance assessment: Needs assistance Sitting-balance support: Feet supported Sitting balance-Leahy Scale: Good     Standing balance support: Bilateral upper extremity supported Standing balance-Leahy Scale: Poor Standing balance comment: reliant on RW for dynamic standing                             Pertinent Vitals/Pain Pain  Assessment Pain Assessment: No/denies pain Faces Pain Scale: No hurt    Home Living Family/patient expects to be discharged to:: Private residence Living Arrangements: Alone Available Help at Discharge: Family;Available PRN/intermittently Type of Home: House Home Access: Level entry       Home Layout: One level Home Equipment: BSC/3in1;Rollator (4 wheels);Adaptive equipment;Shower seat      Prior Function Prior Level of Function : Independent/Modified Independent             Mobility Comments: Mod I  rollator ADLs Comments: B/D self     Hand Dominance   Dominant Hand: Right    Extremity/Trunk Assessment   Upper Extremity Assessment Upper Extremity Assessment: Defer to OT evaluation    Lower Extremity Assessment Lower Extremity Assessment: Generalized weakness    Cervical / Trunk Assessment Cervical / Trunk Assessment: Kyphotic  Communication   Communication: No difficulties  Cognition Arousal/Alertness: Awake/alert Behavior During Therapy: WFL for tasks assessed/performed Overall Cognitive Status: Within Functional Limits for tasks assessed                                          General Comments General comments (skin integrity, edema, etc.): 74 bpm, 94% 100% HHFNC, 130/72, 50LO2 at rest    Exercises General Exercises - Lower Extremity Long Arc Quad: AROM, Both, Seated, 10 reps   Assessment/Plan    PT Assessment Patient needs continued PT services  PT Problem List Decreased strength;Decreased activity tolerance;Decreased balance;Decreased mobility;Decreased knowledge of use of DME;Cardiopulmonary status limiting activity;Obesity       PT Treatment Interventions DME instruction;Gait training;Stair training;Functional mobility training;Therapeutic activities;Therapeutic exercise;Balance training;Neuromuscular re-education;Patient/family education    PT Goals (Current goals can be found in the Care Plan section)  Acute Rehab PT Goals Patient Stated Goal: Get well PT Goal Formulation: With patient Time For Goal Achievement: 01/03/23 Potential to Achieve Goals: Fair    Frequency Min 3X/week     Co-evaluation PT/OT/SLP Co-Evaluation/Treatment: Yes Reason for Co-Treatment: Complexity of the patient's impairments (multi-system involvement) PT goals addressed during session: Mobility/safety with mobility         AM-PAC PT "6 Clicks" Mobility  Outcome Measure Help needed turning from your back to your side while in a flat bed without using  bedrails?: None Help needed moving from lying on your back to sitting on the side of a flat bed without using bedrails?: A Little Help needed moving to and from a bed to a chair (including a wheelchair)?: A Little Help needed standing up from a chair using your arms (e.g., wheelchair or bedside chair)?: A Little Help needed to walk in hospital room?: A Lot Help needed climbing 3-5 steps with a railing? : Total 6 Click Score: 16    End of Session Equipment Utilized During Treatment: Gait belt;Oxygen Activity Tolerance: Patient limited by fatigue (desaturation) Patient left: in chair;with call bell/phone within reach;with chair alarm set Nurse Communication: Mobility status PT Visit Diagnosis: Unsteadiness on feet (R26.81);Difficulty in walking, not elsewhere classified (R26.2)    Time: 1610-9604 PT Time Calculation (min) (ACUTE ONLY): 17 min   Charges:   PT Evaluation $PT Eval Moderate Complexity: 1 Mod          Quantavis Obryant M,PT Acute Rehab Services [phone]   Bevelyn Buckles 12/20/2022, 4:08 PM

## 2022-12-20 NOTE — Evaluation (Addendum)
Occupational Therapy Evaluation Patient Details Name: [name] MRN: [id number] DOB: May 26, 1951 Today's Date: 12/20/2022   History of Present Illness The pt is a 72 yo female presenting from MD's office on 4/23 with worsening cough, shortness of breath, wheezing and blood tinged sputum. Required bipap, weaned to [facility]. Pt admitted 4/4-4/18 with PNA and pulmonary fibrosis. Pt PMH includes: CHF, emphysema, HTN, OA, afib, and chronic pain.   Clinical Impression   Pt report being able to ambulate with a rollator and completing ADLs modified independently with AE in the days she was home prior to readmission. Pt presents with decreased activity tolerance, weakness and impaired standing balance. She needs set up to max assist for ADLs. She was able to mobilize OOB to chair with RW and min guard assist, activity limited by SpO2 dropping to 78% on  50L, 100% FiO2 HHFNC. Pt cued for pursed lip breathing with recovery to 94%.. Pt likely to progress well for return home with HHOT.      Recommendations for follow up therapy are one component of a multi-disciplinary discharge planning process, led by the attending physician.  Recommendations may be updated based on patient status, additional functional criteria and insurance authorization.   Assistance Recommended at Discharge Frequent or constant Supervision/Assistance  Patient can return home with the following A little help with walking and/or transfers;A lot of help with bathing/dressing/bathroom;Assistance with cooking/housework;Assist for transportation;Help with stairs or ramp for entrance    Functional Status Assessment  Patient has had a recent decline in their functional status and demonstrates the ability to make significant improvements in function in a reasonable and predictable amount of time.  Equipment Recommendations  None recommended by OT    Recommendations for Other Services       Precautions / Restrictions  Precautions Precautions: Fall Precaution Comments: Monitor O2 Restrictions Weight Bearing Restrictions: No      Mobility Bed Mobility Overal bed mobility: Needs Assistance Bed Mobility: Supine to Sit     Supine to sit: Min guard     General bed mobility comments: increased time and effort, HOB up    Transfers Overall transfer level: Needs assistance Equipment used: Rolling walker (2 wheels) Transfers: Bed to chair/wheelchair/BSC, Sit to/from Stand Sit to Stand: Min guard     Step pivot transfers: Min guard     General transfer comment: close guard for safety and multiple lines      Balance Overall balance assessment: Needs assistance Sitting-balance support: Feet supported Sitting balance-Leahy Scale: Good     Standing balance support: Bilateral upper extremity supported Standing balance-Leahy Scale: Poor Standing balance comment: reliant on RW for dynamic standing                           ADL either performed or assessed with clinical judgement   ADL Overall ADL's : Needs assistance/impaired Eating/Feeding: Independent;Bed level   Grooming: Set up;Sitting   Upper Body Bathing: Minimal assistance;Sitting   Lower Body Bathing: Maximal assistance;Sit to/from stand   Upper Body Dressing : Set up;Sitting   Lower Body Dressing: Maximal assistance;Sit to/from stand   Toilet Transfer: Min guard;Stand-pivot;Rolling walker (2 wheels)             General ADL Comments: poor activity tolerance limiting ability to reach feet     Vision Ability to See in Adequate Light: 0 Adequate Patient Visual Report: No change from baseline       Perception     Praxis  Pertinent Vitals/Pain Pain Assessment Pain Assessment: Faces Faces Pain Scale: No hurt     Hand Dominance Right   Extremity/Trunk Assessment Upper Extremity Assessment Upper Extremity Assessment: Overall WFL for tasks assessed   Lower Extremity Assessment Lower Extremity  Assessment: Defer to PT evaluation       Communication Communication Communication: No difficulties   Cognition Arousal/Alertness: Awake/alert Behavior During Therapy: WFL for tasks assessed/performed Overall Cognitive Status: Within Functional Limits for tasks assessed                                       General Comments  74 bpm, 94% 100% HHFNC, 130/72, 50LO2    Exercises     Shoulder Instructions      Home Living Family/patient expects to be discharged to:: Private residence Living Arrangements: Alone Available Help at Discharge: Family;Available PRN/intermittently Type of Home: House Home Access: Level entry     Home Layout: One level     Bathroom Shower/Tub: Producer, television/film/video: Handicapped height Bathroom Accessibility: Yes   Home Equipment: BSC/3in1;Rollator (4 wheels);Adaptive equipment;Shower Engineering geologist: Reacher        Prior Functioning/Environment Prior Level of Function : Independent/Modified Independent             Mobility Comments: Mod I rollator ADLs Comments: B/D self        OT Problem List: Decreased strength;Decreased activity tolerance;Impaired balance (sitting and/or standing);Cardiopulmonary status limiting activity      OT Treatment/Interventions: Self-care/ADL training;Energy conservation;Balance training;Patient/family education;Therapeutic activities;DME and/or AE instruction    OT Goals(Current goals can be found in the care plan section) Acute Rehab OT Goals OT Goal Formulation: With patient Time For Goal Achievement: 01/03/23 Potential to Achieve Goals: Good ADL Goals Pt Will Perform Grooming: with modified independence;standing Pt Will Perform Lower Body Bathing: with modified independence;with adaptive equipment;sit to/from stand Pt Will Perform Lower Body Dressing: with modified independence;with adaptive equipment;sit to/from stand Pt Will Transfer to Toilet: with modified  independence;ambulating;bedside commode Pt Will Perform Toileting - Clothing Manipulation and hygiene: with modified independence;sit to/from stand Additional ADL Goal #1: Pt will incorporate pursed lip breathing and energy conservation strategies in ADLs and mobility.  OT Frequency: Min 2X/week    Co-evaluation PT/OT/SLP Co-Evaluation/Treatment: Yes Reason for Co-Treatment: Complexity of the patient's impairments (multi-system involvement)          AM-PAC OT "6 Clicks" Daily Activity     Outcome Measure Help from another person eating meals?: None Help from another person taking care of personal grooming?: A Little Help from another person toileting, which includes using toliet, bedpan, or urinal?: A Lot Help from another person bathing (including washing, rinsing, drying)?: A Lot Help from another person to put on and taking off regular upper body clothing?: A Little Help from another person to put on and taking off regular lower body clothing?: A Lot 6 Click Score: 16   End of Session Equipment Utilized During Treatment: Gait belt;Rolling walker (2 wheels);Oxygen (HHFNC 50L 100%) Nurse Communication: Mobility status  Activity Tolerance: Patient limited by fatigue Patient left: in chair;with call bell/phone within reach  OT Visit Diagnosis: Unsteadiness on feet (R26.81);Other abnormalities of gait and mobility (R26.89);Other (comment) (decreased activity tolerance)                Time: 1610-9604 OT Time Calculation (min): 19 min Charges:  OT General Charges $OT Visit: 1 Visit OT Evaluation $  OT Eval Moderate Complexity: 1 Mod  Berna Spare, OTR/L Acute Rehabilitation Services Office: [phone]  Evern Bio 12/20/2022, 2:35 PM

## 2022-12-20 NOTE — Procedures (Addendum)
Objective Swallowing Evaluation: Type of Study: FEES-Fiberoptic Endoscopic Evaluation of Swallow   Patient Details  Name: [name] MRN: [id number] Date of Birth: July 20, 1951  Today's Date: 12/20/2022 Time: SLP Start Time (ACUTE ONLY): 1211 -SLP Stop Time (ACUTE ONLY): 1234  SLP Time Calculation (min) (ACUTE ONLY): 23 min   Past Medical History:  Past Medical History:  Diagnosis Date   Chronic diastolic CHF (congestive heart failure) (HCC)    Chronic respiratory failure (HCC)    Coronary atherosclerosis    a. 2V by CT 10/2017.   Emphysema of lung (HCC)    Hypercholesterolemia    Hypertension    NSIP (nonspecific interstitial pneumonia) (HCC)    Osteoarthritis    Persistent atrial fibrillation (HCC)    Thoracic aortic ectasia [facility])    Past Surgical History:  Past Surgical History:  Procedure Laterality Date   BIOPSY  12/10/2021   Procedure: BIOPSY;  Surgeon: Meridee Score Netty Starring., MD;  Location: [facility] ENDOSCOPY;  Service: Gastroenterology;;   CARDIOVERSION N/A 07/25/2016   Procedure: CARDIOVERSION;  Surgeon: Pricilla Riffle, MD;  Location: [facility] ENDOSCOPY;  Service: Cardiovascular;  Laterality: N/A;   CARDIOVERSION N/A 09/14/2016   Procedure: CARDIOVERSION;  Surgeon: Hillis Range, MD;  Location: [facility] OR;  Service: Cardiovascular;  Laterality: N/A;   CATARACT EXTRACTION Bilateral    ESOPHAGOGASTRODUODENOSCOPY (EGD) WITH PROPOFOL N/A 12/10/2021   Procedure: ESOPHAGOGASTRODUODENOSCOPY (EGD) WITH PROPOFOL;  Surgeon: Lemar Lofty., MD;  Location: [facility] ENDOSCOPY;  Service: Gastroenterology;  Laterality: N/A;   EUS N/A 12/10/2021   Procedure: UPPER ENDOSCOPIC ULTRASOUND (EUS) RADIAL;  Surgeon: Lemar Lofty., MD;  Location: [facility] ENDOSCOPY;  Service: Gastroenterology;  Laterality: N/A;   FINE NEEDLE ASPIRATION  12/10/2021   Procedure: FINE NEEDLE ASPIRATION;  Surgeon: Meridee Score Netty Starring., MD;  Location: [facility] ENDOSCOPY;  Service: Gastroenterology;;   LEG SURGERY Right    TEE  WITHOUT CARDIOVERSION N/A 07/25/2016   Procedure: TRANSESOPHAGEAL ECHOCARDIOGRAM (TEE);  Surgeon: Pricilla Riffle, MD;  Location: [facility] ENDOSCOPY;  Service: Cardiovascular;  Laterality: N/A;   HPI: 22F with HFpEF, atrial fibrillation on eliquis, chronic respiratory failure with COPD and interstitial lung disease on 5L [place] who was hospitalized 4/4 to 4/18 with acute on chronic respiratory failure treated with antibiotics and steroid taper. She was feeling better but her dyspnea returned as the steroids tapered down. She returns today with worsening cough, blood tinged sputum, wheezing and dyzpnea. She has progressive interstitial changes on serial CT Chest scans 12/11/2022, 11/28/22, 07/16/22, 01/15/22 reviewed. Most recent 4/24: Increasing opacification in the right lung.   Subjective: pt denies any dysphagia, but is agreeable to testing to be thorough    Recommendations for follow up therapy are one component of a multi-disciplinary discharge planning process, led by the attending physician.  Recommendations may be updated based on patient status, additional functional criteria and insurance authorization.  Assessment / Plan / Recommendation     12/20/2022   12:00 PM  Clinical Impressions  Clinical Impression Pt's oropharyngeal swallow is WFL with no aspiration observed during FEES. Pt did have throat clearing during the study, reporting globus sensation, which could be indicative of potential esophageal component although she denies the sensation of any reflux. Posterior commisure hypertrophy and edematous arytenoids could also be suggestive of possible esophageal component though. Recommend that pt remain on regular solids and thin liquids. Education about aspiration precautions was provided to pt and sister at bedside. No further SLP f/u indicated at this time. If aspiration remains in the differential, could also consider  a more dedicated test of the esophagus for possible post-prandial source.  SLP Visit  Diagnosis Dysphagia, unspecified (R13.10)  Impact on safety and function Mild aspiration risk         12/20/2022   12:00 PM  Treatment Recommendations  Treatment Recommendations No treatment recommended at this time         No data to display             12/20/2022   12:00 PM  Diet Recommendations  SLP Diet Recommendations Regular solids;Thin liquid  Liquid Administration via Cup;Straw  Medication Administration Whole meds with liquid  Compensations Slow rate;Small sips/bites  Postural Changes Remain semi-upright after after feeds/meals (Comment);Seated upright at 90 degrees         12/20/2022   12:00 PM  Other Recommendations  Recommended Consults Consider esophageal assessment  Oral Care Recommendations Oral care BID  Follow Up Recommendations No SLP follow up  Functional Status Assessment Patient has not had a recent decline in their functional status        No data to display               12/20/2022   12:00 PM  Oral Phase  Oral Phase [facility]       12/20/2022   12:00 PM  Pharyngeal Phase  Pharyngeal Phase [facility]        12/20/2022   12:00 PM  Cervical Esophageal Phase   Cervical Esophageal Phase --     Mahala Menghini., M.A. CCC-SLP Acute Rehabilitation Services Office [phone]  Secure chat preferred  12/20/2022, 2:10 PM

## 2022-12-21 DIAGNOSIS — J8410 Pulmonary fibrosis, unspecified: Secondary | ICD-10-CM | POA: Diagnosis not present

## 2022-12-21 LAB — CBC WITH DIFFERENTIAL/PLATELET
Abs Immature Granulocytes: 0.1 10*3/uL — ABNORMAL HIGH (ref 0.00–0.07)
Basophils Absolute: 0 10*3/uL (ref 0.0–0.1)
Basophils Relative: 0 %
Eosinophils Absolute: 0 10*3/uL (ref 0.0–0.5)
Eosinophils Relative: 0 %
HCT: 32.3 % — ABNORMAL LOW (ref 36.0–46.0)
Hemoglobin: 10.8 g/dL — ABNORMAL LOW (ref 12.0–15.0)
Immature Granulocytes: 1 %
Lymphocytes Relative: 2 %
Lymphs Abs: 0.3 10*3/uL — ABNORMAL LOW (ref 0.7–4.0)
MCH: 30.5 pg (ref 26.0–34.0)
MCHC: 33.4 g/dL (ref 30.0–36.0)
MCV: 91.2 fL (ref 80.0–100.0)
Monocytes Absolute: 0.7 10*3/uL (ref 0.1–1.0)
Monocytes Relative: 5 %
Neutro Abs: 14.9 10*3/uL — ABNORMAL HIGH (ref 1.7–7.7)
Neutrophils Relative %: 92 %
Platelets: 117 10*3/uL — ABNORMAL LOW (ref 150–400)
RBC: 3.54 MIL/uL — ABNORMAL LOW (ref 3.87–5.11)
RDW: 16.6 % — ABNORMAL HIGH (ref 11.5–15.5)
WBC: 16.1 10*3/uL — ABNORMAL HIGH (ref 4.0–10.5)
nRBC: 0 % (ref 0.0–0.2)

## 2022-12-21 LAB — BASIC METABOLIC PANEL
Anion gap: 5 (ref 5–15)
Anion gap: 9 (ref 5–15)
BUN: 57 mg/dL — ABNORMAL HIGH (ref 8–23)
BUN: 54 mg/dL — ABNORMAL HIGH (ref 8–23)
CO2: 24 mmol/L (ref 22–32)
CO2: 23 mmol/L (ref 22–32)
Calcium: 9.1 mg/dL (ref 8.9–10.3)
Calcium: 9.3 mg/dL (ref 8.9–10.3)
Chloride: 110 mmol/L (ref 98–111)
Chloride: 105 mmol/L (ref 98–111)
Creatinine, Ser: 1.21 mg/dL — ABNORMAL HIGH (ref 0.44–1.00)
Creatinine, Ser: 1.14 mg/dL — ABNORMAL HIGH (ref 0.44–1.00)
GFR, Estimated: 48 mL/min — ABNORMAL LOW (ref >60)
GFR, Estimated: 51 mL/min — ABNORMAL LOW (ref >60)
Glucose, Bld: 160 mg/dL — ABNORMAL HIGH (ref 70–99)
Glucose, Bld: 158 mg/dL — ABNORMAL HIGH (ref 70–99)
Potassium: 5.9 mmol/L — ABNORMAL HIGH (ref 3.5–5.1)
Potassium: 5.3 mmol/L — ABNORMAL HIGH (ref 3.5–5.1)
Sodium: 139 mmol/L (ref 135–145)
Sodium: 137 mmol/L (ref 135–145)

## 2022-12-21 LAB — MAGNESIUM: Magnesium: 2.4 mg/dL (ref 1.7–2.4)

## 2022-12-21 LAB — GLUCOSE, CAPILLARY
Glucose-Capillary: 142 mg/dL — ABNORMAL HIGH (ref 70–99)
Glucose-Capillary: 218 mg/dL — ABNORMAL HIGH (ref 70–99)
Glucose-Capillary: 196 mg/dL — ABNORMAL HIGH (ref 70–99)
Glucose-Capillary: 194 mg/dL — ABNORMAL HIGH (ref 70–99)

## 2022-12-21 MED ORDER — FUROSEMIDE 10 MG/ML IJ SOLN
60.0000 mg | Freq: Once | INTRAMUSCULAR | Status: AC
  Administered 2022-12-21: 60 mg via INTRAVENOUS
  Filled 2022-12-21: qty 6

## 2022-12-21 MED ORDER — FUROSEMIDE 10 MG/ML IJ SOLN
40.0000 mg | Freq: Once | INTRAMUSCULAR | Status: AC
  Administered 2022-12-21: 40 mg via INTRAVENOUS
  Filled 2022-12-21: qty 4

## 2022-12-21 MED ORDER — GLUCERNA SHAKE PO LIQD
237.0000 mL | Freq: Three times a day (TID) | ORAL | Status: DC
  Administered 2022-12-21 – 2022-12-23 (×6): 237 mL via ORAL

## 2022-12-21 NOTE — Progress Notes (Signed)
eLink Physician-Brief Progress Note Patient Name: KAMMY KLETT DOB: 1951/04/06 MRN: [id number]   Date of Service  12/21/2022  HPI/Events of Note  Potassium 5.9 creat 1.21  eICU Interventions  Repeat bmp 0800     Intervention Category Intermediate Interventions: Electrolyte abnormality - evaluation and management  Henry Russel, P 12/21/2022, 4:57 AM

## 2022-12-21 NOTE — Progress Notes (Signed)
Pt with significant increased WOB, SOB. Placed back on bipap to allow her to rest. Pt states she feels better on bipap. RT will closely monitor pt

## 2022-12-21 NOTE — Progress Notes (Signed)
Patient taken off Bipap and placed on HHFNC 35L, 94%.  RN at bedside.

## 2022-12-21 NOTE — Progress Notes (Signed)
NAME:  [name], MRN:  [id number], DOB:  27-Jun-1951, LOS: 4 ADMISSION DATE:  12/06/2022, CONSULTATION DATE:  12/04/2022 REFERRING MD:  EDP, CHIEF COMPLAINT:  hypoxia   History of Present Illness:  72yo female with hx dCHF, Afib on eliquis, chronic respiratory failure due to COPD, pulmonary fibrosis on chronic 5L [place] recently hospitalized 4/4-4/18 with acute on chronic respiratory failure r/t CAP treated with 5 days rocephin/doxy and steroid taper. Returns to ER 4/23 with ongoing SOB and hypoxia in PCP office with sats 80's despite her home 5L O2.   States she "felt better" and was nearing baseline after d/c 4/18. Had a few days of feeling well, good energy, minimal SOB above baseline. Then 4/22 developed worsening cough, wheezing, SOB, malaise.    She has progressive interstitial changes on serial CT Chest scans 12/19/2022, 11/28/22, 07/16/22, 01/15/22 reviewed.    She had weak positive CCP ab in 2019, negative ANA and rheumatoid factor  Pertinent  Medical History   has a past medical history of Chronic diastolic CHF (congestive heart failure) (HCC), Chronic respiratory failure (HCC), Coronary atherosclerosis, Emphysema of lung (HCC), Hypercholesterolemia, Hypertension, NSIP (nonspecific interstitial pneumonia) (HCC), Osteoarthritis, Persistent atrial fibrillation (HCC), and Thoracic aortic ectasia (HCC).   Significant Hospital Events: Including procedures, antibiotic start and stop dates in addition to other pertinent events   4/23 CT chest>> Pulmonary fibrosis. Bronchiectasis. There are patchy ground-glass densities in the periphery of both lungs, more so in the lower lung fields suggesting possible superimposed interstitial pneumonia. There is interval decrease in patchy alveolar infiltrates in both lungs in comparison with the CT done on 11/28/2022. There is no pleural effusion or pneumothorax.   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 Coronary  artery calcifications are seen. 4/25 transferred to ICU for BiPAP dependence  Interim History / Subjective:   Remains critically ill on heated high flow nasal cannula   Objective   Blood pressure 125/62, pulse 71, temperature (!) 97.1 F (36.2 C), temperature source Axillary, resp. rate (!) 25, height 5' (1.524 m), weight 87.3 kg, SpO2 (!) 88 %.    FiO2 (%):  [60 %-100 %] 95 % PEEP:  [10 cmH20] 10 cmH20   Intake/Output Summary (Last 24 hours) at 12/21/2022 1104 Last data filed at 12/21/2022 0600 Gross per 24 hour  Intake 1260.04 ml  Output 800 ml  Net 460.04 ml    Filed Weights   11/26/2022 1011 12/16/2022 1956  Weight: 86.6 kg 87.3 kg    Examination: Elderly obese woman sitting up in bed No JVD , mild pallor, no icterus Dry crackles, no rhonchi, no accessory muscle use S1-S2 regular, no murmur Abdomen soft obese nontender Extremities warm with edema  Labs reviewed  Resolved Hospital Problem list     Assessment & Plan:  Acute on chronic respiratory failure  Progressive ILD -DD includes IPF, 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 4/2 9 Procalcitonin is low, continue doxycycline Budesonide/Brovana/Yupelri combination Trial of IV Lasix 4/2 7  History of atrial fibrillation -Continue Tikosyn and apixaban  Steroid-induced hyperglycemia  -SSI   Best Practice (right click and "Reselect all SmartList Selections" daily)  Regular diet Apixaban Full CODE STATUS   Labs   CBC: Recent Labs  Lab 12/10/2022 1020 12/19/2022 1037 12/18/22 0002 12/19/22 0022 12/20/22 0207 12/21/22 0206  WBC 18.6*   --  10.5 18.2* 14.8* 16.1*  NEUTROABS 14.8*  --   --  16.8* 13.7* 14.9*  HGB 13.0 13.9 10.9* 10.7* 10.7* 10.8*  HCT 39.5 41.0 32.8* 31.5* 31.4* 32.3*  MCV 91.2  --  89.9 89.0 90.8 91.2  PLT 156  --  132* 126* 112* 117*     Basic Metabolic Panel: Recent Labs  Lab 12/18/22 0833 12/19/22 0022 12/20/22 0207 12/21/22 0206 12/21/22 0722  NA 136 137 138 139 137  K 5.3* 4.4 4.3 5.9* 5.3*  CL 99 103 105 110 105  CO2 23 25 23 24 23   GLUCOSE 166* 171* 142* 160* 158*  BUN 30* 38* 48* 57* 54*  CREATININE 1.21* 1.25* 0.96 1.21* 1.14*  CALCIUM 9.4 9.1 8.9 9.1 9.3  MG 2.3 2.2 2.1 2.4  --     GFR: Estimated Creatinine Clearance: 43.8 mL/min (A) (by C-G formula based on SCr of 1.14 mg/dL (H)). Recent Labs  Lab 12/07/2022 1020 12/11/2022 1210 12/02/2022 1430 12/18/22 0002 12/19/22 0022 12/20/22 0207 12/20/22 1503 12/21/22 0206  PROCALCITON  --   --  <0.10  --   --   --  <0.10  --   WBC 18.6*  --   --  10.5 18.2* 14.8*  --  16.1*  LATICACIDVEN 2.3* 2.0*  --   --   --   --   --   --      Liver Function Tests: No results for input(s): "AST", "ALT", "ALKPHOS", "BILITOT", "PROT", "ALBUMIN" in the last 168 hours. No results for input(s): "LIPASE", "AMYLASE" in the last 168 hours. No results for input(s): "AMMONIA" in the last 168 hours.  ABG    Component Value Date/Time   HCO3 29.9 (H) 12/08/2022 1037   TCO2 31 12/01/2022 1037   O2SAT 59 12/13/2022 1037     Coagulation Profile: No results for input(s): "INR", "PROTIME" in the last 168 hours.  Cardiac Enzymes: Recent Labs  Lab 12/19/2022 1430  CKTOTAL 33*     HbA1C: Hgb A1c MFr Bld  Date/Time Value Ref Range Status  12/01/2022 08:24 AM 6.6 (H) 4.8 - 5.6 % Final    Comment:    (NOTE) Pre diabetes:          5.7%-6.4%  Diabetes:              >6.4%  Glycemic control for   <7.0% adults with diabetes   02/06/2016 04:47 AM 6.2 (H) 4.8 - 5.6 % Final    Comment:    (NOTE)         Pre-diabetes: 5.7 - 6.4         Diabetes:  >6.4         Glycemic control for adults with diabetes: <7.0     CBG: Recent Labs  Lab 12/20/22 0709 12/20/22 1119 12/20/22 1624 12/20/22 2041 12/21/22 0714  GLUCAP 154* 169* 136* 174* 142*    CRITICAL CARE Performed by: Lesia Sago Lavante Toso   Total critical care time: 31 minutes  Critical care time was exclusive of separately billable procedures and treating other patients.  Critical care was necessary to treat or prevent imminent or life-threatening deterioration.  Critical care was time spent personally by me on the following activities: development of treatment plan with patient and/or surrogate as well as nursing, discussions with  consultants, evaluation of patient's response to treatment, examination of patient, obtaining history from patient or surrogate, ordering and performing treatments and interventions, ordering and review of laboratory studies, ordering and review of radiographic studies, pulse oximetry and re-evaluation of patient's condition.  Karren Burly, MD [facility] See Amion for contact info  If no response to pager , please call [phone] until 7 pm After 7:00 pm call Elink  [phone]    12/21/2022, 11:04 AM

## 2022-12-21 NOTE — Progress Notes (Signed)
RT called to patient's room to place patient back on Bipap due to increased WOB and feeling tired.

## 2022-12-22 DIAGNOSIS — J8410 Pulmonary fibrosis, unspecified: Secondary | ICD-10-CM | POA: Diagnosis not present

## 2022-12-22 LAB — GLUCOSE, CAPILLARY
Glucose-Capillary: 172 mg/dL — ABNORMAL HIGH (ref 70–99)
Glucose-Capillary: 171 mg/dL — ABNORMAL HIGH (ref 70–99)
Glucose-Capillary: 206 mg/dL — ABNORMAL HIGH (ref 70–99)
Glucose-Capillary: 157 mg/dL — ABNORMAL HIGH (ref 70–99)

## 2022-12-22 LAB — BASIC METABOLIC PANEL
Anion gap: 11 (ref 5–15)
BUN: 60 mg/dL — ABNORMAL HIGH (ref 8–23)
CO2: 23 mmol/L (ref 22–32)
Calcium: 9.4 mg/dL (ref 8.9–10.3)
Chloride: 106 mmol/L (ref 98–111)
Creatinine, Ser: 1.13 mg/dL — ABNORMAL HIGH (ref 0.44–1.00)
GFR, Estimated: 52 mL/min — ABNORMAL LOW (ref >60)
Glucose, Bld: 170 mg/dL — ABNORMAL HIGH (ref 70–99)
Potassium: 4.1 mmol/L (ref 3.5–5.1)
Sodium: 140 mmol/L (ref 135–145)

## 2022-12-22 LAB — MAGNESIUM: Magnesium: 2.4 mg/dL (ref 1.7–2.4)

## 2022-12-22 MED ORDER — MORPHINE SULFATE (PF) 2 MG/ML IV SOLN
1.0000 mg | INTRAVENOUS | Status: DC | PRN
  Administered 2022-12-23 – 2022-12-24 (×5): 1 mg via INTRAVENOUS
  Filled 2022-12-22 (×6): qty 1

## 2022-12-22 MED ORDER — CHLORHEXIDINE GLUCONATE CLOTH 2 % EX PADS
6.0000 | MEDICATED_PAD | Freq: Every day | CUTANEOUS | Status: DC
  Administered 2022-12-22: 6 via TOPICAL

## 2022-12-22 MED ORDER — FUROSEMIDE 10 MG/ML IJ SOLN
40.0000 mg | Freq: Two times a day (BID) | INTRAMUSCULAR | Status: AC
  Administered 2022-12-22 (×2): 40 mg via INTRAVENOUS
  Filled 2022-12-22 (×2): qty 4

## 2022-12-22 MED ORDER — MORPHINE SULFATE (PF) 2 MG/ML IV SOLN
1.0000 mg | INTRAVENOUS | Status: DC | PRN
  Administered 2022-12-22: 1 mg via INTRAVENOUS
  Filled 2022-12-22: qty 1

## 2022-12-22 NOTE — Progress Notes (Signed)
NAME:  [name], MRN:  [id number], DOB:  April 23, 1951, LOS: 5 ADMISSION DATE:  12/09/2022, CONSULTATION DATE:  12/19/2022 REFERRING MD:  EDP, CHIEF COMPLAINT:  hypoxia   History of Present Illness:  73yo female with hx dCHF, Afib on eliquis, chronic respiratory failure due to COPD, pulmonary fibrosis on chronic 5L [place] recently hospitalized 4/4-4/18 with acute on chronic respiratory failure r/t CAP treated with 5 days rocephin/doxy and steroid taper. Returns to ER 4/23 with ongoing SOB and hypoxia in PCP office with sats 80's despite her home 5L O2.   States she "felt better" and was nearing baseline after d/c 4/18. Had a few days of feeling well, good energy, minimal SOB above baseline. Then 4/22 developed worsening cough, wheezing, SOB, malaise.    She has progressive interstitial changes on serial CT Chest scans 11/28/2022, 11/28/22, 07/16/22, 01/15/22 reviewed.    She had weak positive CCP ab in 2019, negative ANA and rheumatoid factor  Pertinent  Medical History   has a past medical history of Chronic diastolic CHF (congestive heart failure) (HCC), Chronic respiratory failure (HCC), Coronary atherosclerosis, Emphysema of lung (HCC), Hypercholesterolemia, Hypertension, NSIP (nonspecific interstitial pneumonia) (HCC), Osteoarthritis, Persistent atrial fibrillation (HCC), and Thoracic aortic ectasia (HCC).   Significant Hospital Events: Including procedures, antibiotic start and stop dates in addition to other pertinent events   4/23 CT chest>> Pulmonary fibrosis. Bronchiectasis. There are patchy ground-glass densities in the periphery of both lungs, more so in the lower lung fields suggesting possible superimposed interstitial pneumonia. There is interval decrease in patchy alveolar infiltrates in both lungs in comparison with the CT done on 11/28/2022. There is no pleural effusion or pneumothorax.   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 Coronary  artery calcifications are seen. 4/25 transferred to ICU for BiPAP dependence  Interim History / Subjective:   Wore BiPAP overnight, tolerates Heated high flow nasal cannula 35 L I/os -1.9 L total   Objective   Blood pressure 118/61, pulse 79, temperature 98.1 F (36.7 C), temperature source Oral, resp. rate (!) 27, height 5' (1.524 m), weight 87.3 kg, SpO2 (!) 86 %.    FiO2 (%):  [70 %-100 %] 95 % PEEP:  [5 cmH20-10 cmH20] 10 cmH20 Pressure Support:  [10 cmH20] 10 cmH20   Intake/Output Summary (Last 24 hours) at 12/22/2022 0934 Last data filed at 12/22/2022 0100 Gross per 24 hour  Intake 860.28 ml  Output 1850 ml  Net -989.72 ml   Filed Weights   12/21/2022 1011 12/04/2022 1956  Weight: 86.6 kg 87.3 kg    Examination: Pleasant elderly obese woman sitting up in bed with O2 in place Oropharynx clear.  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 , postinfectious/post ARDS fibrosis given recent hospitalization for multifocal pneumonia she had decreased to her baseline 5 L oxygen after 2 weeks of inpatient treatment which suggest steroid responsive ILD COPD  Suspected HCAP  -Continue heated high flow nasal cannula, wean as able for SpO2 88% -BiPAP mandatory at night, as needed during the day -Day 3 of 3 Solu-Medrol 125 twice daily, plan to taper to begin on 4/29 -Continue doxycycline for possible superimposed pneumonia (low procalcitonin) -Continue budesonide, Rosalyn Gess, Yupelri -Tolerated  Lasix 4/27, repeat 4/28 and follow BMP  History of atrial fibrillation -Home apixaban, dofetilide as ordered  Steroid-induced  hyperglycemia -Continue sliding scale insulin as ordered   Best Practice (right click and "Reselect all SmartList Selections" daily)  Regular diet Apixaban Full CODE STATUS   Labs   CBC: Recent Labs  Lab 11/29/2022 1020 11/29/2022 1037 12/18/22 0002 12/19/22 0022 12/20/22 0207 12/21/22 0206  WBC 18.6*  --  10.5 18.2* 14.8* 16.1*  NEUTROABS 14.8*  --   --  16.8* 13.7* 14.9*  HGB 13.0 13.9 10.9* 10.7* 10.7* 10.8*  HCT 39.5 41.0 32.8* 31.5* 31.4* 32.3*  MCV 91.2  --  89.9 89.0 90.8 91.2  PLT 156  --  132* 126* 112* 117*    Basic Metabolic Panel: Recent Labs  Lab 12/18/22 0833 12/19/22 0022 12/20/22 0207 12/21/22 0206 12/21/22 0722 12/22/22 0744  NA 136 137 138 139 137 140  K 5.3* 4.4 4.3 5.9* 5.3* 4.1  CL 99 103 105 110 105 106  CO2 23 25 23 24 23 23   GLUCOSE 166* 171* 142* 160* 158* 170*  BUN 30* 38* 48* 57* 54* 60*  CREATININE 1.21* 1.25* 0.96 1.21* 1.14* 1.13*  CALCIUM 9.4 9.1 8.9 9.1 9.3 9.4  MG 2.3 2.2 2.1 2.4  --  2.4   GFR: Estimated Creatinine Clearance: 44.2 mL/min (A) (by C-G formula based on SCr of 1.13 mg/dL (H)). Recent Labs  Lab 12/16/2022 1020 12/19/2022 1210 11/28/2022 1430 12/18/22 0002 12/19/22 0022 12/20/22 0207 12/20/22 1503 12/21/22 0206  PROCALCITON  --   --  <0.10  --   --   --  <0.10  --   WBC 18.6*  --   --  10.5 18.2* 14.8*  --  16.1*  LATICACIDVEN 2.3* 2.0*  --   --   --   --   --   --     Liver Function Tests: No results for input(s): "AST", "ALT", "ALKPHOS", "BILITOT", "PROT", "ALBUMIN" in the last 168 hours. No results for input(s): "LIPASE", "AMYLASE" in the last 168 hours. No results for input(s): "AMMONIA" in the last 168 hours.  ABG    Component Value Date/Time   HCO3 29.9 (H) 12/03/2022 1037   TCO2 31 12/23/2022 1037   O2SAT 59 12/23/2022 1037     Coagulation Profile: No results for input(s): "INR", "PROTIME" in the last 168 hours.  Cardiac Enzymes: Recent Labs  Lab 12/16/2022 1430  CKTOTAL 33*    HbA1C: Hgb  A1c MFr Bld  Date/Time Value Ref Range Status  12/01/2022 08:24 AM 6.6 (H) 4.8 - 5.6 % Final    Comment:    (NOTE) Pre diabetes:          5.7%-6.4%  Diabetes:              >6.4%  Glycemic control for   <7.0% adults with diabetes   02/06/2016 04:47 AM 6.2 (H) 4.8 - 5.6 % Final    Comment:    (NOTE)         Pre-diabetes: 5.7 - 6.4         Diabetes: >6.4         Glycemic control for adults with diabetes: <7.0     CBG: Recent Labs  Lab 12/21/22 0714 12/21/22 1117 12/21/22 1614 12/21/22 2044 12/22/22 0742  GLUCAP 142* 218* 196* 194* 172*   CRITICAL CARE Performed by: Leslye Peer   Total critical care time: 31 minutes  Critical care time was exclusive of separately billable  procedures and treating other patients.  Critical care was necessary to treat or prevent imminent or life-threatening deterioration.  Critical care was time spent personally by me on the following activities: development of treatment plan with patient and/or surrogate as well as nursing, discussions with consultants, evaluation of patient's response to treatment, examination of patient, obtaining history from patient or surrogate, ordering and performing treatments and interventions, ordering and review of laboratory studies, ordering and review of radiographic studies, pulse oximetry and re-evaluation of patient's condition.   Levy Pupa, MD, PhD 12/22/2022, 9:34 AM [facility] [phone] or if no answer before 7:00PM call [phone] For any issues after 7:00PM please call eLink [phone]

## 2022-12-23 ENCOUNTER — Ambulatory Visit (HOSPITAL_COMMUNITY): Payer: Medicare Other | Admitting: Internal Medicine

## 2022-12-23 ENCOUNTER — Inpatient Hospital Stay (HOSPITAL_COMMUNITY): Payer: Medicare Other

## 2022-12-23 ENCOUNTER — Inpatient Hospital Stay: Payer: Medicare Other | Admitting: Nurse Practitioner

## 2022-12-23 LAB — HYPERSENSITIVITY PNEUMONITIS
A. Pullulans Abs: NEGATIVE
A.Fumigatus #1 Abs: NEGATIVE
Micropolyspora faeni, IgG: NEGATIVE
Pigeon Serum Abs: NEGATIVE
Thermoact. Saccharii: NEGATIVE
Thermoactinomyces vulgaris, IgG: NEGATIVE

## 2022-12-23 LAB — BASIC METABOLIC PANEL
Anion gap: 10 (ref 5–15)
BUN: 67 mg/dL — ABNORMAL HIGH (ref 8–23)
CO2: 23 mmol/L (ref 22–32)
Calcium: 9.1 mg/dL (ref 8.9–10.3)
Chloride: 107 mmol/L (ref 98–111)
Creatinine, Ser: 1.29 mg/dL — ABNORMAL HIGH (ref 0.44–1.00)
GFR, Estimated: 44 mL/min — ABNORMAL LOW (ref >60)
Glucose, Bld: 188 mg/dL — ABNORMAL HIGH (ref 70–99)
Potassium: 4.1 mmol/L (ref 3.5–5.1)
Sodium: 140 mmol/L (ref 135–145)

## 2022-12-23 LAB — GLUCOSE, CAPILLARY
Glucose-Capillary: 237 mg/dL — ABNORMAL HIGH (ref 70–99)
Glucose-Capillary: 204 mg/dL — ABNORMAL HIGH (ref 70–99)
Glucose-Capillary: 177 mg/dL — ABNORMAL HIGH (ref 70–99)
Glucose-Capillary: 185 mg/dL — ABNORMAL HIGH (ref 70–99)

## 2022-12-23 LAB — MAGNESIUM: Magnesium: 2.3 mg/dL (ref 1.7–2.4)

## 2022-12-23 LAB — ANTINUCLEAR ANTIBODIES, IFA: ANA Ab, IFA: NEGATIVE

## 2022-12-23 MED ORDER — DOCUSATE SODIUM 100 MG PO CAPS
100.0000 mg | ORAL_CAPSULE | Freq: Two times a day (BID) | ORAL | Status: DC
  Administered 2022-12-23: 100 mg via ORAL
  Filled 2022-12-23: qty 1

## 2022-12-23 MED ORDER — INSULIN ASPART 100 UNIT/ML IJ SOLN
0.0000 [IU] | Freq: Three times a day (TID) | INTRAMUSCULAR | Status: DC
  Administered 2022-12-23: 3 [IU] via SUBCUTANEOUS
  Administered 2022-12-23: 5 [IU] via SUBCUTANEOUS

## 2022-12-23 MED ORDER — POLYETHYLENE GLYCOL 3350 17 G PO PACK
17.0000 g | PACK | Freq: Two times a day (BID) | ORAL | Status: DC
  Administered 2022-12-23: 17 g via ORAL
  Filled 2022-12-23: qty 1

## 2022-12-23 MED ORDER — INSULIN ASPART 100 UNIT/ML IJ SOLN: 0.0000 [IU] | Freq: Every day | INTRAMUSCULAR | Status: DC

## 2022-12-23 MED ORDER — INSULIN ASPART 100 UNIT/ML IJ SOLN: 0.0000 [IU] | Freq: Three times a day (TID) | INTRAMUSCULAR | Status: DC

## 2022-12-23 MED ORDER — SODIUM CHLORIDE 0.9 % IV SOLN
3.0000 g | Freq: Four times a day (QID) | INTRAVENOUS | Status: DC
  Administered 2022-12-23 – 2022-12-24 (×4): 3 g via INTRAVENOUS
  Filled 2022-12-23 (×4): qty 8

## 2022-12-23 MED ORDER — NYSTATIN 100000 UNIT/ML MT SUSP
5.0000 mL | Freq: Four times a day (QID) | OROMUCOSAL | Status: DC
  Administered 2022-12-23 (×4): 500000 [IU] via ORAL
  Filled 2022-12-23 (×8): qty 5

## 2022-12-23 MED ORDER — METHYLPREDNISOLONE SODIUM SUCC 125 MG IJ SOLR: 80.0000 mg | Freq: Every day | INTRAMUSCULAR | Status: DC

## 2022-12-23 NOTE — Progress Notes (Signed)
Pharmacy ICU Bowel Regimen Consult Note   Current Inpatient Medications for Bowel Management:  none  Assessment: [name] is a 72 y.o. year old female admitted on 11/29/2022. Constipation identified as non-opioid-induced constipation . Bowel regimen assessment completed by Kavin Leech on 12/23/22. LBM on 12/21/22.  [x]  Bowel sounds present  [x]  No abdominal tenderness  [x]  Passing gas   Diet: Carb Modified - Fluid consistency: Thin  Plan: Start Docusate 100mg  BID and Miralax 17g BID MD contacted (if needed):  Thank you for allowing pharmacy to participate in this patient's care.  Ellis Savage, PharmD Clinical Pharmacist 12/23/2022,11:23 AM

## 2022-12-23 NOTE — Progress Notes (Signed)
Patient switched to Heated High Flow [place] at 0730 to eat breakfast.  Patient's oxygen saturation levels fluctuating between 82-88%.      RN assisted patient to complete oral care, patient's oxygen saturation remaining at 82% post oral care and patient experiencing increased shortness of breath and tachypnea.   RN offered to place patient back on Bi-Pap and patient declined Bi-Pap at this time.  PRN morphine for air hunger administered.

## 2022-12-23 NOTE — Progress Notes (Signed)
PT Cancellation Note  Patient Details Name: [name] MRN: [id number] DOB: 1951/05/08   Cancelled Treatment:    Reason Eval/Treat Not Completed: Medical issues which prohibited therapy (Pt having difficulty breathing per nurse. She is on 100% FiO2 HHFNC with 60L/min O2.  Will cancel for today and check back tomorrow.)   Bevelyn Buckles 12/23/2022, 10:44 AM Quantisha Marsicano M,PT Acute Rehab Services [phone]

## 2022-12-23 NOTE — Progress Notes (Signed)
OT Cancellation Note  Patient Details Name: TULA SCHRYVER MRN: [id number] DOB: [birth year]   Cancelled Treatment:    Reason Eval/Treat Not Completed: Medical issues which prohibited therapy (Per RN, OT to cancel today due to respiratory status.)  Donia Pounds 12/23/2022, 11:09 AM

## 2022-12-23 NOTE — Progress Notes (Signed)
Patient is feeling tired with increased WOB. Patient placed back on Bipap support.

## 2022-12-23 NOTE — Progress Notes (Signed)
NAME:  [name], MRN:  [id number], DOB:  Aug 10, 1951, LOS: 6 ADMISSION DATE:  12/11/2022, CONSULTATION DATE:  11/26/2022 REFERRING MD:  EDP, CHIEF COMPLAINT:  hypoxia   History of Present Illness:  72 y o female with hx dCHF, Afib on eliquis, chronic respiratory failure due to COPD, pulmonary fibrosis on chronic 5L [place] recently hospitalized 4/4-4/18 with acute on chronic respiratory failure r/t CAP treated with 5 days rocephin/doxy and steroid taper. Returns to ER 4/23 with ongoing SOB and hypoxia in PCP office with sats 80's despite her home 5L O2.   States she "felt better" and was nearing baseline after d/c 4/18. Had a few days of feeling well, good energy, minimal SOB above baseline. Then 4/22 developed worsening cough, wheezing, SOB, malaise.    She has progressive interstitial changes on serial CT Chest scans 12/11/2022, 11/28/22, 07/16/22, 01/15/22 reviewed.    She had weak positive CCP ab in 2019, negative ANA and rheumatoid factor  Pertinent  Medical History   has a past medical history of Chronic diastolic CHF (congestive heart failure) (HCC), Chronic respiratory failure (HCC), Coronary atherosclerosis, Emphysema of lung (HCC), Hypercholesterolemia, Hypertension, NSIP (nonspecific interstitial pneumonia) (HCC), Osteoarthritis, Persistent atrial fibrillation (HCC), and Thoracic aortic ectasia (HCC).   Significant Hospital Events: Including procedures, antibiotic start and stop dates in addition to other pertinent events   4/23 CT chest>> Pulmonary fibrosis. Bronchiectasis. There are patchy ground-glass densities in the periphery of both lungs, more so in the lower lung fields suggesting possible superimposed interstitial pneumonia. There is interval decrease in patchy alveolar infiltrates in both lungs in comparison with the CT done on 11/28/2022. There is no pleural effusion or pneumothorax.   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 Coronary  artery calcifications are seen. 4/25 transferred to ICU for BiPAP dependence  Interim History / Subjective:  Patient wore BiPAP overnight, tolerated well This morning was switched to heated high flow nasal cannula, flow was increased to 60 L and she is on 100% with O2 sat between 85 and 88%  Received Lasix but remains net +800 cc  Objective   Blood pressure (!) 145/65, pulse 76, temperature 98.2 F (36.8 C), temperature source Oral, resp. rate (!) 32, height 5' (1.524 m), weight 87.3 kg, SpO2 (!) 87 %.    FiO2 (%):  [70 %-100 %] 100 %   Intake/Output Summary (Last 24 hours) at 12/23/2022 1157 Last data filed at 12/23/2022 1100 Gross per 24 hour  Intake 2943.05 ml  Output 1825 ml  Net 1118.05 ml   Filed Weights   12/09/2022 1011 12/11/2022 1956  Weight: 86.6 kg 87.3 kg    Examination: Physical exam: General: Acute on chronically ill-appearing obese female, lying on the bed, on HHNF HEENT: [place]/AT, eyes anicteric.  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 , postinfectious/post ARDS fibrosis given recent hospitalization for multifocal pneumonia she had decreased to her baseline 5 L oxygen after 2 weeks of inpatient treatment which suggest steroid responsive ILD COPD, not in exacerbation Suspected HCAP  Continue heated high flow nasal cannula, wean as  able for SpO2 85-88% BiPAP mandatory at night, as needed during the day Received 3 days of Solu-Medrol 125 mg twice daily, now decreased to 80 mg once daily (1 mg/kg) Stop  doxycycline Started on IV Unasyn Continue budesonide, Brovana, Yupelri Received Lasix yesterday with bump in serum creatinine from 1.1-1.29, holding off on diuretics for now Encourage incentive spirometry Repeat x-ray chest shows persistent bilateral multifocal infiltrates R>L  Paroxysmal atrial fibrillation Remain in sinus rhythm, heart rate is well-controlled Continue diltiazem and dofetilide Continue apixaban for stroke prophylaxis  Steroid-induced hyperglycemia Continue sliding scale insulin as ordered  Acute kidney injury likely due to aggressive diuresis Serum creatinine bumped to 1.29, her baseline serum creatinine is around 0.9 Monitor intake and output Avoid nephrotoxic agents  Thrombocytopenia of critical illness Platelet count is stable around 110's Watch for signs of bleeding Monitor CBC  Obesity Dietitian is following Counseling provided regarding diet and exercise  Best Practice (right click and "Reselect all SmartList Selections" daily)  Regular diet Apixaban Full CODE STATUS   Labs   CBC: Recent Labs  Lab 12/15/2022 1020 12/15/2022 1037 12/18/22 0002 12/19/22 0022 12/20/22 0207 12/21/22 0206  WBC 18.6*  --  10.5 18.2* 14.8* 16.1*  NEUTROABS 14.8*  --   --  16.8* 13.7* 14.9*  HGB 13.0 13.9 10.9* 10.7* 10.7* 10.8*  HCT 39.5 41.0 32.8* 31.5* 31.4* 32.3*  MCV 91.2  --  89.9 89.0 90.8 91.2  PLT 156  --  132* 126* 112* 117*    Basic Metabolic Panel: Recent Labs  Lab 12/19/22 0022 12/20/22 0207 12/21/22 0206 12/21/22 0722 12/22/22 0744 12/23/22 0231  NA 137 138 139 137 140 140  K 4.4 4.3 5.9* 5.3* 4.1 4.1  CL 103 105 110 105 106 107  CO2 25 23 24 23 23 23   GLUCOSE 171* 142* 160* 158* 170* 188*  BUN 38* 48* 57* 54* 60* 67*  CREATININE 1.25* 0.96 1.21* 1.14* 1.13* 1.29*  CALCIUM 9.1 8.9 9.1 9.3 9.4 9.1  MG 2.2 2.1 2.4  --  2.4 2.3   GFR: Estimated Creatinine Clearance: 38.7 mL/min (A) (by C-G formula based on SCr of 1.29 mg/dL (H)). Recent  Labs  Lab 12/08/2022 1020 12/23/2022 1210 12/04/2022 1430 12/18/22 0002 12/19/22 0022 12/20/22 0207 12/20/22 1503 12/21/22 0206  PROCALCITON  --   --  <0.10  --   --   --  <0.10  --   WBC 18.6*  --   --  10.5 18.2* 14.8*  --  16.1*  LATICACIDVEN 2.3* 2.0*  --   --   --   --   --   --     Liver Function Tests: No results for input(s): "AST", "ALT", "ALKPHOS", "BILITOT", "PROT", "ALBUMIN" in the last 168 hours. No results for input(s): "LIPASE", "AMYLASE" in the last 168 hours. No results for input(s): "AMMONIA" in the last 168 hours.  ABG    Component Value Date/Time   HCO3 29.9 (H) 11/28/2022 1037   TCO2 31 12/14/2022 1037   O2SAT 59 12/08/2022 1037     Coagulation Profile: No results for input(s): "INR", "PROTIME" in the last 168 hours.  Cardiac Enzymes: Recent Labs  Lab 12/23/2022 1430  CKTOTAL 33*    HbA1C: Hgb A1c MFr Bld  Date/Time Value Ref Range Status  12/01/2022 08:24 AM 6.6 (H) 4.8 - 5.6 % Final    Comment:    (NOTE) Pre diabetes:          5.7%-6.4%  Diabetes:              >  6.4%  Glycemic control for   <7.0% adults with diabetes   02/06/2016 04:47 AM 6.2 (H) 4.8 - 5.6 % Final    Comment:    (NOTE)         Pre-diabetes: 5.7 - 6.4         Diabetes: >6.4         Glycemic control for adults with diabetes: <7.0     CBG: Recent Labs  Lab 12/22/22 1133 12/22/22 1702 12/22/22 2211 12/23/22 0755 12/23/22 1101  GLUCAP 171* 206* 157* 237* 204*   This patient is critically ill with multiple organ system failure which requires frequent high complexity decision making, assessment, support, evaluation, and titration of therapies. This was completed through the application of advanced monitoring technologies and extensive interpretation of multiple databases.  During this encounter critical care time was devoted to patient care services described in this note for 39 minutes.    Cheri Fowler, MD [facility] See Amion for pager If no  response to pager, please call [phone] until 7pm After 7pm, Please call E-link [phone]

## 2022-12-24 ENCOUNTER — Inpatient Hospital Stay (HOSPITAL_COMMUNITY): Payer: Medicare Other

## 2022-12-24 ENCOUNTER — Ambulatory Visit: Payer: Medicare Other | Admitting: Podiatry

## 2022-12-24 LAB — GLUCOSE, CAPILLARY: Glucose-Capillary: 308 mg/dL — ABNORMAL HIGH (ref 70–99)

## 2022-12-24 LAB — MAGNESIUM: Magnesium: 2.5 mg/dL — ABNORMAL HIGH (ref 1.7–2.4)

## 2022-12-24 MED ORDER — ETOMIDATE 2 MG/ML IV SOLN
INTRAVENOUS | Status: AC
  Filled 2022-12-24: qty 20

## 2022-12-24 MED ORDER — MORPHINE SULFATE (PF) 2 MG/ML IV SOLN
INTRAVENOUS | Status: AC
  Filled 2022-12-24: qty 1

## 2022-12-24 MED ORDER — FENTANYL CITRATE PF 50 MCG/ML IJ SOSY
PREFILLED_SYRINGE | INTRAMUSCULAR | Status: AC
  Filled 2022-12-24: qty 2

## 2022-12-24 MED ORDER — MORPHINE BOLUS VIA INFUSION: 4.0000 mg | INTRAVENOUS | Status: DC | PRN

## 2022-12-24 MED ORDER — MORPHINE SULFATE (PF) 2 MG/ML IV SOLN
1.0000 mg | Freq: Once | INTRAVENOUS | Status: AC
  Administered 2022-12-24: 1 mg via INTRAVENOUS

## 2022-12-24 MED ORDER — ONDANSETRON 4 MG PO TBDP: 4.0000 mg | ORAL_TABLET | Freq: Four times a day (QID) | ORAL | Status: DC | PRN

## 2022-12-24 MED ORDER — ROCURONIUM BROMIDE 10 MG/ML (PF) SYRINGE
PREFILLED_SYRINGE | INTRAVENOUS | Status: AC
  Filled 2022-12-24: qty 10

## 2022-12-24 MED ORDER — MORPHINE 100MG IN NS 100ML (1MG/ML) PREMIX INFUSION
0.0000 mg/h | INTRAVENOUS | Status: DC
  Administered 2022-12-24: 5 mg/h via INTRAVENOUS
  Filled 2022-12-24: qty 100

## 2022-12-24 MED ORDER — GLYCOPYRROLATE 0.2 MG/ML IJ SOLN: 0.2000 mg | INTRAMUSCULAR | Status: DC | PRN

## 2022-12-24 MED ORDER — ONDANSETRON HCL 4 MG/2ML IJ SOLN: 4.0000 mg | Freq: Four times a day (QID) | INTRAMUSCULAR | Status: DC | PRN

## 2022-12-24 MED ORDER — POLYVINYL ALCOHOL 1.4 % OP SOLN: 1.0000 [drp] | Freq: Four times a day (QID) | OPHTHALMIC | Status: DC | PRN

## 2022-12-24 MED ORDER — ACETAMINOPHEN 650 MG RE SUPP: 650.0000 mg | Freq: Four times a day (QID) | RECTAL | Status: DC | PRN

## 2022-12-24 MED ORDER — METOPROLOL TARTRATE 5 MG/5ML IV SOLN: 5.0000 mg | Freq: Once | INTRAVENOUS | Status: AC

## 2022-12-24 MED ORDER — SODIUM CHLORIDE 0.9 % IV SOLN: INTRAVENOUS | Status: DC

## 2022-12-24 MED ORDER — ACETAMINOPHEN 325 MG PO TABS: 650.0000 mg | ORAL_TABLET | Freq: Four times a day (QID) | ORAL | Status: DC | PRN

## 2022-12-24 MED ORDER — MORPHINE SULFATE (PF) 2 MG/ML IV SOLN
2.0000 mg | INTRAVENOUS | Status: DC | PRN
  Administered 2022-12-24: 2 mg via INTRAVENOUS

## 2022-12-24 MED ORDER — GLYCOPYRROLATE 1 MG PO TABS: 1.0000 mg | ORAL_TABLET | ORAL | Status: DC | PRN

## 2022-12-24 MED ORDER — KETAMINE HCL 50 MG/5ML IJ SOSY
PREFILLED_SYRINGE | INTRAMUSCULAR | Status: AC
  Filled 2022-12-24: qty 10

## 2022-12-24 MED ORDER — MIDAZOLAM HCL 2 MG/2ML IJ SOLN
INTRAMUSCULAR | Status: AC
  Filled 2022-12-24: qty 2

## 2022-12-24 MED ORDER — METOPROLOL TARTRATE 5 MG/5ML IV SOLN
INTRAVENOUS | Status: AC
  Administered 2022-12-24: 5 mg
  Filled 2022-12-24: qty 5

## 2022-12-24 MED ORDER — SUCCINYLCHOLINE CHLORIDE 200 MG/10ML IV SOSY
PREFILLED_SYRINGE | INTRAVENOUS | Status: AC
  Filled 2022-12-24: qty 10

## 2022-12-24 MED ORDER — MIDAZOLAM HCL 2 MG/2ML IJ SOLN: 2.0000 mg | INTRAMUSCULAR | Status: DC | PRN

## 2022-12-24 MED ORDER — HALOPERIDOL LACTATE 5 MG/ML IJ SOLN: 2.5000 mg | INTRAMUSCULAR | Status: DC | PRN

## 2022-12-24 MED ORDER — MORPHINE BOLUS VIA INFUSION: 2.0000 mg | INTRAVENOUS | Status: DC | PRN

## 2022-12-24 MED ORDER — DEXMEDETOMIDINE HCL IN NACL 400 MCG/100ML IV SOLN
0.0000 ug/kg/h | INTRAVENOUS | Status: DC
  Administered 2022-12-24: 0.4 ug/kg/h via INTRAVENOUS
  Filled 2022-12-24: qty 100

## 2022-12-24 MED ORDER — HYDRALAZINE HCL 20 MG/ML IJ SOLN
10.0000 mg | INTRAMUSCULAR | Status: DC | PRN
  Administered 2022-12-24: 10 mg via INTRAVENOUS
  Filled 2022-12-24: qty 1

## 2022-12-25 NOTE — IPAL (Signed)
  Interdisciplinary Goals of Care Family Meeting   Date carried out:: 12/13/2022  Location of the meeting: Bedside  Member's involved: Physician, Bedside Registered Nurse, and Family Member or next of kin  Durable Power of Attorney or acting medical decision maker: Sons Scottie and Nida Boatman and daughter Charleston Ropes by phone    Discussion: We discussed goals of care for [employer]. Sharp deterioration this morning with saturation in 70s on 100% O2 through BiPAP. In light of her underlying chronic lung disease and now likely fibrotic phase ARDS recommendation was to not proceed with intubation. Faces long and rocky road to recovery with tracheostomy and significant loss of independence if recovery even possible. They are in agreement. She is now DNR, will transition to comfort measures only when family arrives.   Code status: Limited Code or DNR with short term  Disposition: In-patient comfort care   Time spent for the meeting: 15 minutes  Omar Person 12/01/2022, 7:43 AM

## 2022-12-25 NOTE — Progress Notes (Signed)
   12/22/2022 0900  Spiritual Encounters  Type of Visit Initial  Care provided to: Pt and family  Referral source Family  Reason for visit End-of-life  OnCall Visit No  Interventions  Spiritual Care Interventions Made Compassionate presence;Reflective listening;Normalization of emotions;Prayer   Ch responded to request for emotional and spiritual support. Pt's family was at bedside. Family said they are at peace knowing that Latashia is going to a better place. Ch conveyed a calming presence and journeyed with the family in the grieving process. Ch offered a word of prayer for peace. Ch is available if needed.

## 2022-12-25 NOTE — Progress Notes (Signed)
PT Cancellation Note  Patient Details Name: [name] MRN: [id number] DOB: 08/13/1951   Cancelled Treatment:    Reason Eval/Treat Not Completed: Other (comment). Pt transitioning to comfort care. Will sign off.   Angelina Ok [facility] 12/15/2022, 8:52 AM Skip Mayer PT Acute [employer] [phone]

## 2022-12-25 NOTE — Progress Notes (Signed)
Patient taken off Bipap and placed on HHFNC 40L, 100% to be able to speak to family. Comfort care, will titrated L and FIO2 as able.

## 2022-12-25 NOTE — Progress Notes (Signed)
Pt passed away at 1108,  MD. Notify, family at bedside, postmortem care completed.

## 2022-12-25 NOTE — Progress Notes (Signed)
eLink Physician-Brief Progress Note Patient Name: [name] DOB: 1951/04/26 MRN: [id number]   Date of Service  12/07/2022  HPI/Events of Note  Notified that pt is anxious and complaining of dyspnea on BIPAP, has received morphine earlier with good response.  SBP now elevated to 190s, HR 130s, SpO2 dropping to the 70s as well.  CXR reviewed, shows persistent bilateral opacities, R >L  eICU Interventions  Ordered additional dose of morphine 1mg  IV.  Would plan to start on precedex as well.  If BP remains elevated, despite above measures, would consider labetalol.  Maintain on BIPAP for now, position with R side up.  Will reassess respiratory status. If does not respond to above measures, continued desaturation, may need to be intubated.          [name] M DELA CRUZ 12/23/2022, 6:50 AM

## 2022-12-25 NOTE — Progress Notes (Signed)
OT Cancellation Note  Patient Details Name: [name] MRN: [id number] DOB: 1951-03-01   Cancelled Treatment:    Reason Eval/Treat Not Completed: Other (comment) (Transitioning to comfort measures, OT will sign off. Thank you.)  Donia Pounds 12/04/2022, 9:18 AM

## 2022-12-25 DEATH — deceased

## 2022-12-26 ENCOUNTER — Inpatient Hospital Stay: Payer: Medicare Other | Admitting: Nurse Practitioner

## 2023-01-21 ENCOUNTER — Other Ambulatory Visit: Payer: Medicare Other

## 2023-01-21 ENCOUNTER — Ambulatory Visit: Payer: Medicare Other | Admitting: Thoracic Surgery (Cardiothoracic Vascular Surgery)

## 2023-01-25 NOTE — Discharge Summary (Signed)
DEATH SUMMARY   Patient Details  Name: [name] MRN: [id number] DOB: 27-Aug-1950  Admission/Discharge Information   Admit Date:  Dec 29, 2022  Date of Death: Date of Death: 01/05/23  Time of Death: Time of Death: 01-13-1107  Length of Stay: 7  Referring Physician: [name], MD   Reason(s) for Hospitalization  Acute on chronic hypoxic respiratory failure Acute exacerbation of interstitial lung disease  Diagnoses  Preliminary cause of death:  Secondary Diagnoses (including complications and co-morbidities):  Principal Problem:   Pulmonary fibrosis (HCC) Active Problems:   Essential hypertension   Hypercholesterolemia   Anticoagulated   Chronic diastolic heart failure (HCC)   OSA treated with BiPAP   Thoracic aortic ectasia (HCC)   Anxiety disorder   Chronic pain syndrome   Type 2 diabetes mellitus with other specified complication (HCC)   Healthcare-associated pneumonia   Acute on chronic respiratory failure with hypoxia (HCC)   Paroxysmal atrial fibrillation (HCC)   Obesity (BMI 30-39.9)   Brief Hospital Course (including significant findings, care, treatment, and services provided and events leading to death)  [name] is a 72 y.o. year old female who was admitted 2022/12/29 with dyspnea found to have acute on chronic hypoxic respiratory failure due to acute exacerbation of interstitial lung disease. She received high dose solumedrol, IV ABX and required high level of noninvasive respiratory support with heated high flow nasal cannula and BiPAP intermittently. Unfortunately her respiratory failure progressed despite these supportive measures and decision made with family to transition to comfort measures only on the morning of 05-Jan-2023. Her time of death was 11:08am.     Pertinent Labs and Studies  Significant Diagnostic Studies DG CHEST PORT 1 VIEW  Result Date: 05-Jan-2023 CLINICAL DATA:  Acute respiratory failure EXAM: PORTABLE CHEST 1 VIEW COMPARISON:  Film from  the previous day. FINDINGS: Cardiac shadow is stable. Diffuse bilateral airspace opacities are again identified right greater than left stable from prior exam. No bony abnormality is noted. IMPRESSION: Stable bilateral airspace opacities. Electronically Signed   By: Alcide Clever M.D.   On: 2023/01/05 00:40   DG CHEST PORT 1 VIEW  Result Date: 12/23/2022 CLINICAL DATA:  Acute hypercapnic respiratory failure EXAM: PORTABLE CHEST 1 VIEW COMPARISON:  Chest radiograph 12/19/2022, CT chest 2022-12-29. FINDINGS: The cardiomediastinal silhouette is grossly stable. Extensive airspace opacity is again seen in both lungs, right worse than left, overall not significantly changed since 12/19/2022. There is no new or worsening focal airspace disease. There is no significant pleural effusion. There is no appreciable pneumothorax There is no acute osseous abnormality. IMPRESSION: Extensive airspace opacities in both lungs, right worse than left, are overall not significantly changed since 12/19/2022. Electronically Signed   By: Lesia Hausen M.D.   On: 12/23/2022 09:49   DG CHEST PORT 1 VIEW  Result Date: 12/19/2022 CLINICAL DATA:  Hypoxia. EXAM: PORTABLE CHEST 1 VIEW COMPARISON:  December 18, 2022 FINDINGS: Cardiomediastinal silhouette is normal. Calcific atherosclerotic disease and tortuosity of the aorta. Patient is rotated to the right. There is an opacity in the right hilar/suprahilar region which could be partially related to the overlapping heart shadow, however underlying lymphadenopathy/mass or airspace consolidation cannot be excluded. There are chronic fibrotic interstitial changes in bilateral lungs. Osseous structures are without acute abnormality. Soft tissues are grossly normal. IMPRESSION: 1. Opacity in the right hilar/suprahilar region which could be partially related to the overlapping heart shadow, however underlying lymphadenopathy/mass or airspace consolidation cannot be excluded. Further evaluation with CT  of the chest  with contrast may be considered. 2. Chronic fibrotic interstitial changes in bilateral lungs. Electronically Signed   By: Ted Mcalpine M.D.   On: 12/19/2022 10:12   DG CHEST PORT 1 VIEW  Result Date: 12/18/2022 CLINICAL DATA:  Respiratory failure EXAM: PORTABLE CHEST 1 VIEW COMPARISON:  12/05/2022 CT FINDINGS: Cardiac shadow is stable. Lungs are well aerated bilaterally. Diffuse fibrotic changes are again noted similar to that seen on the prior exam. Some generalized increased density is noted in the right lung however when compared with the prior exam. Portion of this is related to patient rotation to the right. There is also a likely some central developing airspace opacity. Continued follow-up is recommended. IMPRESSION: Increasing opacification in the right lung as described. Follow-up is recommended. Electronically Signed   By: Alcide Clever M.D.   On: 12/18/2022 13:07   CT Chest Wo Contrast  Result Date: 12/08/2022 CLINICAL DATA:  Pneumonia, worsening dyspnea, productive cough EXAM: CT CHEST WITHOUT CONTRAST TECHNIQUE: Multidetector CT imaging of the chest was performed following the standard protocol without IV contrast. RADIATION DOSE REDUCTION: This exam was performed according to the departmental dose-optimization program which includes automated exposure control, adjustment of the mA and/or kV according to patient size and/or use of iterative reconstruction technique. COMPARISON:  Previous CT done on 11/28/2022, previous chest radiographs including the study done on 12/09/2022 FINDINGS: Cardiovascular: There is aneurysmal dilation in proximal descending thoracic aorta measuring 5.4 x 5.2 cm. There is ectasia of the inferior course of thoracic aorta measuring 4.6 cm in diameter. Ascending thoracic aorta measures 3.9 cm. There are scattered calcifications in thoracic aorta and its major branches. Coronary artery calcifications are seen. Mediastinum/Nodes: No new significant  lymphadenopathy is seen in mediastinum. Lungs/Pleura: There is ectasia of bronchi. Increased interstitial markings are seen in the periphery of both lungs, more so in the lower lung fields. Patchy ground-glass densities are seen in the periphery of both lungs. There is interval decrease in patchy alveolar infiltrates in both lungs. There is no pleural effusion or pneumothorax. Upper Abdomen: Visualized upper abdomen is unremarkable. Musculoskeletal: No acute findings are seen. IMPRESSION: Pulmonary fibrosis. Bronchiectasis. There are patchy ground-glass densities in the periphery of both lungs, more so in the lower lung fields suggesting possible superimposed interstitial pneumonia. There is interval decrease in patchy alveolar infiltrates in both lungs in comparison with the CT done on 11/28/2022. There is no pleural effusion or pneumothorax. 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 Coronary artery calcifications are seen. Electronically Signed   By: Ernie Avena M.D.   On: 12/16/2022 11:47   DG CHEST PORT 1 VIEW  Result Date: 12/09/2022 CLINICAL DATA:  Acute respiratory failure.  Hypoxemia EXAM: PORTABLE CHEST 1 VIEW COMPARISON:  X-ray 12/02/2022 and older FINDINGS: Enlarged cardiopericardial silhouette. Calcified and tortuous aorta. Chronic interstitial changes are again seen. There is improving interstitial changes however in the lungs particularly right midlung and left lung base with trace residual. No pneumothorax or effusion. Film is rotated and under penetrated. Overlapping cardiac leads. Apical pleural thickening. IMPRESSION: Improving interstitial changes with some residual Electronically Signed   By: Karen Kays M.D.   On: 12/09/2022 12:24   DG Chest Port 1 View  Result Date: 12/02/2022 CLINICAL DATA:  [id number].  Pneumonia follow-up. EXAM: PORTABLE CHEST 1 VIEW COMPARISON:  Portable chest yesterday at 2:55 p.m. FINDINGS: 5:21 a.m. Stable cardiomegaly,  aortic tortuosity and ectasia, aortic atherosclerosis. Central vessels are normal caliber. Widespread interstitial and patchy airspace opacities of the lung  fields continue to be seen, slightly improved in general. No pleural effusion is evident. Osteopenia. IMPRESSION: 1. Slight improvement in widespread interstitial and patchy airspace opacities. 2. Stable cardiomegaly. 3. Aortic atherosclerosis. 4. No new or worsening findings. Electronically Signed   By: Almira Bar M.D.   On: 12/02/2022 07:44   DG Chest Port 1 View  Result Date: 12/01/2022 CLINICAL DATA:  Pneumonia EXAM: PORTABLE CHEST 1 VIEW COMPARISON:  Chest x-ray 12/01/2022 FINDINGS: Diffuse interstitial opacities in both lungs and multifocal airspace disease bilaterally, right greater left, has not significantly changed. The cardiomediastinal silhouette is within normal limits and stable. There is no pneumothorax. Osseous structures are stable as well. IMPRESSION: No significant interval change in diffuse interstitial opacities and multifocal airspace disease bilaterally, right greater left. Electronically Signed   By: Darliss Cheney M.D.   On: 12/01/2022 16:16   ECHOCARDIOGRAM COMPLETE  Result Date: 12/01/2022    ECHOCARDIOGRAM REPORT   Patient Name:   MEGON KALINA Date of Exam: 12/01/2022 Medical Rec #:  [id number]       Height:       60.0 in Accession #:    [id number]      Weight:       195.8 lb Date of Birth:  01-17-1951       BSA:          1.850 m Patient Age:    71 years        BP:           107/66 mmHg Patient Gender: F               HR:           87 bpm. Exam Location:  Inpatient Procedure: 2D Echo, Color Doppler, Cardiac Doppler and Intracardiac            Opacification Agent Indications:    R06.9 DOE  History:        Patient has prior history of Echocardiogram examinations, most                 recent 05/15/2022. CHF, COPD, Arrythmias:Atrial Fibrillation;                 Risk Factors:Hypertension, Diabetes, Dyslipidemia and Sleep                  Apnea.  Sonographer:    Irving Burton Senior RDCS Referring Phys: A CALDWELL POWELL JR  Sonographer Comments: Very poor echo windows due to patient body habitus and significant lung interference from active pneumonia IMPRESSIONS  1. Left ventricular ejection fraction, by estimation, is 60 to 65%. The left ventricle has normal function. The left ventricle has no regional wall motion abnormalities. Left ventricular diastolic parameters are consistent with Grade I diastolic dysfunction (impaired relaxation). Elevated left atrial pressure.  2. Right ventricular systolic function was not well visualized. The right ventricular size is not well visualized. Tricuspid regurgitation signal is inadequate for assessing PA pressure.  3. The mitral valve was not well visualized. No evidence of mitral valve regurgitation. No evidence of mitral stenosis.  4. The aortic valve was not well visualized. There is mild calcification of the aortic valve. Aortic valve regurgitation is not visualized. Aortic valve sclerosis/calcification is present, without any evidence of aortic stenosis.  5. The inferior vena cava is normal in size with greater than 50% respiratory variability, suggesting right atrial pressure of 3 mmHg. Comparison(s): No significant change from prior study. Prior images reviewed side by side. FINDINGS  Left Ventricle: Left ventricular ejection fraction, by estimation, is 60 to 65%. The left ventricle has normal function. The left ventricle has no regional wall motion abnormalities. Definity contrast agent was given IV to delineate the left ventricular  endocardial borders. The left ventricular internal cavity size was normal in size. Suboptimal image quality limits for assessment of left ventricular hypertrophy. Left ventricular diastolic parameters are consistent with Grade I diastolic dysfunction (impaired relaxation). Elevated left atrial pressure. Right Ventricle: The right ventricular size is not well visualized.  Right vetricular wall thickness was not well visualized. Right ventricular systolic function was not well visualized. Tricuspid regurgitation signal is inadequate for assessing PA pressure. Left Atrium: Left atrial size was normal in size. Right Atrium: Right atrial size was not well visualized. Pericardium: There is no evidence of pericardial effusion. Presence of epicardial fat layer. Mitral Valve: The mitral valve was not well visualized. Mild to moderate mitral annular calcification. No evidence of mitral valve regurgitation. No evidence of mitral valve stenosis. Tricuspid Valve: The tricuspid valve is not well visualized. Tricuspid valve regurgitation is not demonstrated. No evidence of tricuspid stenosis. Aortic Valve: The aortic valve was not well visualized. There is mild calcification of the aortic valve. Aortic valve regurgitation is not visualized. Aortic valve sclerosis/calcification is present, without any evidence of aortic stenosis. Pulmonic Valve: The pulmonic valve was not well visualized. Pulmonic valve regurgitation is not visualized. No evidence of pulmonic stenosis. Aorta: The aortic root is normal in size and structure and the ascending aorta was not well visualized. Venous: The inferior vena cava is normal in size with greater than 50% respiratory variability, suggesting right atrial pressure of 3 mmHg. IAS/Shunts: The interatrial septum was not well visualized.  LEFT VENTRICLE PLAX 2D LVOT diam:     2.00 cm   Diastology LV SV:         61        LV e' medial:    3.70 cm/s LV SV Index:   33        LV E/e' medial:  15.8 LVOT Area:     3.14 cm  LV e' lateral:   4.79 cm/s                          LV E/e' lateral: 12.2  LEFT ATRIUM             Index LA Vol (A2C):   36.6 ml 19.79 ml/m LA Vol (A4C):   19.6 ml 10.60 ml/m LA Biplane Vol: 26.8 ml 14.49 ml/m  AORTIC VALVE LVOT Vmax:   94.80 cm/s LVOT Vmean:  71.400 cm/s LVOT VTI:    0.193 m  AORTA Ao Root diam: 3.20 cm Ao Asc diam:  3.50 cm MITRAL  VALVE MV Area (PHT): 2.75 cm     SHUNTS MV Decel Time: 276 msec     Systemic VTI:  0.19 m MV E velocity: 58.40 cm/s   Systemic Diam: 2.00 cm MV A velocity: 125.00 cm/s MV E/A ratio:  0.47 Mihai Croitoru MD Electronically signed by Thurmon Fair MD Signature Date/Time: 12/01/2022/12:18:13 PM    Final    DG Chest Port 1 View  Result Date: 12/01/2022 CLINICAL DATA:  72 year old female with a history of pulmonary fibrosis. "Pneumonia, pneumothorax". EXAM: PORTABLE CHEST 1 VIEW COMPARISON:  Chest CTA 11/28/2022 and earlier. FINDINGS: Portable AP semi upright view at 0632 hours. The patient is more rotated to the right. Lower lung volumes. Background pulmonary fibrosis. Stable  cardiac size and mediastinal contours. No pneumothorax is evident. No definite pleural effusion or new pulmonary opacity. Visualized tracheal air column is within normal limits. Stable visualized osseous structures. IMPRESSION: Rotated portable view. Pulmonary Fibrosis with no definite acute cardiopulmonary abnormality. Electronically Signed   By: Odessa Fleming M.D.   On: 12/01/2022 07:19   CT Angio Chest PE W and/or Wo Contrast  Result Date: 11/28/2022 CLINICAL DATA:  Shortness of breath and headache.  Hemoptysis today. EXAM: CT ANGIOGRAPHY CHEST WITH CONTRAST TECHNIQUE: Multidetector CT imaging of the chest was performed using the standard protocol during bolus administration of intravenous contrast. Multiplanar CT image reconstructions and MIPs were obtained to evaluate the vascular anatomy. RADIATION DOSE REDUCTION: This exam was performed according to the departmental dose-optimization program which includes automated exposure control, adjustment of the mA and/or kV according to patient size and/or use of iterative reconstruction technique. CONTRAST:  80mL OMNIPAQUE IOHEXOL 350 MG/ML SOLN COMPARISON:  Radiographs earlier today and CTA chest 07/16/2022 FINDINGS: Cardiovascular: Satisfactory opacification of the central pulmonary arteries. The  segmental and subsegmental lower lobe pulmonary arteries are not well evaluated due to motion. No evidence of central pulmonary embolism. Normal heart size. No pericardial effusion. Coronary artery and aortic atherosclerotic calcification. 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 Mediastinum/Nodes: No enlarged mediastinal, hilar, or axillary lymph nodes. Thyroid gland, trachea, and esophagus demonstrate no significant findings. Lungs/Pleura: Low lung volumes. Diffuse patchy ground-glass opacities and interlobular septal thickening are new since 07/16/2022. This is superimposed on a background of mild pulmonary fibrosis with a apical to basal gradient and peripheral reticular opacities. No pleural effusion or pneumothorax. Right upper lobe bronchiolectasis. Upper Abdomen: No acute abnormality. Musculoskeletal: No chest wall abnormality. No acute osseous findings. Review of the MIP images confirms the above findings. IMPRESSION: 1. No evidence of central pulmonary embolism. The segmental and subsegmental lower lobe pulmonary arteries are not well evaluated due to motion. 2. Diffuse patchy ground-glass opacities superimposed on a background of mild pulmonary fibrosis. Findings are favored to represent atypical infection. 3. 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 Recommend semi-annual imaging followup by CTA or MRA and referral to cardiothoracic surgery if not already obtained. This recommendation follows 2010 ACCF/AHA/AATS/ACR/ASA/SCA/SCAI/SIR/STS/SVM Guidelines for the Diagnosis and Management of  Patients With Thoracic Aortic Disease. Circulation. 2010; 121: Z610-R604. Aortic aneurysm NOS (ICD10-I71.9) 4. 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 Aortic Atherosclerosis (ICD10-I70.0). Electronically Signed   By: Minerva Fester M.D.   On: 11/28/2022 19:52   DG Chest Port 1 View  Result Date: 11/28/2022 CLINICAL DATA:  Question sepsis. EXAM: PORTABLE CHEST 1 VIEW COMPARISON:  Chest CT 07/16/2022 FINDINGS: Stable enlarged cardiac silhouette ectatic aorta. There is bilateral interstitial prominence. Low lung volumes. No pneumothorax. No focal consolidation IMPRESSION: Cardiomegaly and interstitial prominence suggest interstitial edema. Electronically Signed   By: Genevive Bi M.D.   On: 11/28/2022 14:59    Microbiology Recent Results (from the past 240 hour(s))  SARS Coronavirus 2 by RT PCR (hospital order, performed in [facility] hospital lab) *cepheid single result test* Anterior Nasal Swab     Status: None  Collection Time: 12/03/2022 10:03 AM   Specimen: Anterior Nasal Swab  Result Value Ref Range Status   SARS Coronavirus 2 by RT PCR NEGATIVE NEGATIVE Final    Comment: Performed at [facility], [address], [place], [place] [address]  Expectorated Sputum Assessment w Gram Stain, Rflx to Resp Cult     Status: None   Collection Time: 12/20/2022  3:50 PM   Specimen: Expectorated Sputum  Result Value Ref Range Status   Specimen Description EXPECTORATED SPUTUM  Final   Special Requests NONE  Final   Sputum evaluation   Final    Sputum specimen not acceptable for testing.  Please recollect.   notified RN Rowe Pavy ON 12/15/2022 @ 2027 BY DRT Performed at [facility], [address] [place], [place] [address]    Report Status 12/15/2022 FINAL  Final  MRSA Next Gen by PCR, Nasal     Status: None   Collection Time: 12/14/2022  8:08 PM   Specimen: Nasal Mucosa; Nasal Swab  Result Value Ref Range Status   MRSA by  PCR Next Gen NOT DETECTED NOT DETECTED Final    Comment: (NOTE) The GeneXpert MRSA Assay (FDA approved for NASAL specimens only), is one component of a comprehensive MRSA colonization surveillance program. It is not intended to diagnose MRSA infection nor to guide or monitor treatment for MRSA infections. Test performance is not FDA approved in patients less than 11 years old. Performed at [facility], [address] [place], [place] [address]   Respiratory (~20 pathogens) panel by PCR     Status: None   Collection Time: 12/18/22 11:27 AM   Specimen: Nasopharyngeal Swab; Respiratory  Result Value Ref Range Status   Adenovirus NOT DETECTED NOT DETECTED Final   Coronavirus 229E NOT DETECTED NOT DETECTED Final    Comment: (NOTE) The Coronavirus on the Respiratory Panel, DOES NOT test for the novel  Coronavirus (2019 nCoV)    Coronavirus HKU1 NOT DETECTED NOT DETECTED Final   Coronavirus NL63 NOT DETECTED NOT DETECTED Final   Coronavirus OC43 NOT DETECTED NOT DETECTED Final   Metapneumovirus NOT DETECTED NOT DETECTED Final   Rhinovirus / Enterovirus NOT DETECTED NOT DETECTED Final   Influenza A NOT DETECTED NOT DETECTED Final   Influenza B NOT DETECTED NOT DETECTED Final   Parainfluenza Virus 1 NOT DETECTED NOT DETECTED Final   Parainfluenza Virus 2 NOT DETECTED NOT DETECTED Final   Parainfluenza Virus 3 NOT DETECTED NOT DETECTED Final   Parainfluenza Virus 4 NOT DETECTED NOT DETECTED Final   Respiratory Syncytial Virus NOT DETECTED NOT DETECTED Final   Bordetella pertussis NOT DETECTED NOT DETECTED Final   Bordetella Parapertussis NOT DETECTED NOT DETECTED Final   Chlamydophila pneumoniae NOT DETECTED NOT DETECTED Final   Mycoplasma pneumoniae NOT DETECTED NOT DETECTED Final    Comment: Performed at [facility], [address], [place], [place] [address]    Lab Basic Metabolic Panel: Recent Labs  Lab 12/20/22 0207 12/21/22 0206 12/21/22 0722 12/22/22 0744  12/23/22 0231 12/18/2022 0159  NA 138 139 137 140 140  --   K 4.3 5.9* 5.3* 4.1 4.1  --   CL 105 110 105 106 107  --   CO2 23 24 23 23 23   --   GLUCOSE 142* 160* 158* 170* 188*  --   BUN 48* 57* 54* 60* 67*  --   CREATININE 0.96 1.21* 1.14* 1.13* 1.29*  --   CALCIUM 8.9 9.1  9.3 9.4 9.1  --   MG 2.1 2.4  --  2.4 2.3 2.5*   Liver Function Tests: No results for input(s): "AST", "ALT", "ALKPHOS", "BILITOT", "PROT", "ALBUMIN" in the last 168 hours. No results for input(s): "LIPASE", "AMYLASE" in the last 168 hours. No results for input(s): "AMMONIA" in the last 168 hours. CBC: Recent Labs  Lab 12/19/22 0022 12/20/22 0207 12/21/22 0206  WBC 18.2* 14.8* 16.1*  NEUTROABS 16.8* 13.7* 14.9*  HGB 10.7* 10.7* 10.8*  HCT 31.5* 31.4* 32.3*  MCV 89.0 90.8 91.2  PLT 126* 112* 117*   Cardiac Enzymes: No results for input(s): "CKTOTAL", "CKMB", "CKMBINDEX", "TROPONINI" in the last 168 hours. Sepsis Labs: Recent Labs  Lab 12/19/22 0022 12/20/22 0207 12/20/22 1503 12/21/22 0206  PROCALCITON  --   --  <0.10  --   WBC 18.2* 14.8*  --  16.1*   Exam (prior to death): General appearance: 72 y.o., female, chroincally ill appearing Eyes: not tracking Lungs: wearing NIV with adequate tidal volumes, markedly increased respiratory effort CV: tachy RR, no murmur  Abdomen: Soft, non-tender; non-distended, BS present  Extremities: No peripheral edema, warm Skin: Normal turgor and texture; no rash Neuro: inattentive but grossly nonfocal    Procedures/Operations  See Epic for details    Omar Person 12/25/2022, 2:26 PM
